# Patient Record
Sex: Female | Born: 1950 | Race: White | Hispanic: No | Marital: Married | Smoking: Never smoker
Health system: Southern US, Community
[De-identification: ages and names within clinical notes are randomized; demographics above are authoritative.]

## PROBLEM LIST (undated history)

## (undated) DIAGNOSIS — G2581 Restless legs syndrome: Secondary | ICD-10-CM

## (undated) DIAGNOSIS — R0602 Shortness of breath: Secondary | ICD-10-CM

## (undated) DIAGNOSIS — G4730 Sleep apnea, unspecified: Secondary | ICD-10-CM

## (undated) DIAGNOSIS — K469 Unspecified abdominal hernia without obstruction or gangrene: Secondary | ICD-10-CM

## (undated) DIAGNOSIS — E049 Nontoxic goiter, unspecified: Secondary | ICD-10-CM

## (undated) DIAGNOSIS — Z8719 Personal history of other diseases of the digestive system: Secondary | ICD-10-CM

## (undated) DIAGNOSIS — T4145XA Adverse effect of unspecified anesthetic, initial encounter: Secondary | ICD-10-CM

## (undated) DIAGNOSIS — C50919 Malignant neoplasm of unspecified site of unspecified female breast: Secondary | ICD-10-CM

## (undated) DIAGNOSIS — K7460 Unspecified cirrhosis of liver: Secondary | ICD-10-CM

## (undated) DIAGNOSIS — R011 Cardiac murmur, unspecified: Secondary | ICD-10-CM

## (undated) DIAGNOSIS — F419 Anxiety disorder, unspecified: Secondary | ICD-10-CM

## (undated) DIAGNOSIS — I454 Nonspecific intraventricular block: Secondary | ICD-10-CM

## (undated) DIAGNOSIS — K5790 Diverticulosis of intestine, part unspecified, without perforation or abscess without bleeding: Secondary | ICD-10-CM

## (undated) DIAGNOSIS — R51 Headache: Secondary | ICD-10-CM

## (undated) DIAGNOSIS — Z8711 Personal history of peptic ulcer disease: Secondary | ICD-10-CM

## (undated) DIAGNOSIS — T8859XA Other complications of anesthesia, initial encounter: Secondary | ICD-10-CM

## (undated) DIAGNOSIS — G5790 Unspecified mononeuropathy of unspecified lower limb: Secondary | ICD-10-CM

## (undated) DIAGNOSIS — K759 Inflammatory liver disease, unspecified: Secondary | ICD-10-CM

## (undated) DIAGNOSIS — D509 Iron deficiency anemia, unspecified: Secondary | ICD-10-CM

## (undated) DIAGNOSIS — T7840XA Allergy, unspecified, initial encounter: Secondary | ICD-10-CM

## (undated) DIAGNOSIS — K2970 Gastritis, unspecified, without bleeding: Secondary | ICD-10-CM

## (undated) DIAGNOSIS — G709 Myoneural disorder, unspecified: Secondary | ICD-10-CM

## (undated) DIAGNOSIS — K644 Residual hemorrhoidal skin tags: Secondary | ICD-10-CM

## (undated) DIAGNOSIS — F329 Major depressive disorder, single episode, unspecified: Secondary | ICD-10-CM

## (undated) DIAGNOSIS — E042 Nontoxic multinodular goiter: Secondary | ICD-10-CM

## (undated) DIAGNOSIS — F32A Depression, unspecified: Secondary | ICD-10-CM

## (undated) DIAGNOSIS — E0590 Thyrotoxicosis, unspecified without thyrotoxic crisis or storm: Secondary | ICD-10-CM

## (undated) DIAGNOSIS — I209 Angina pectoris, unspecified: Secondary | ICD-10-CM

## (undated) DIAGNOSIS — K219 Gastro-esophageal reflux disease without esophagitis: Secondary | ICD-10-CM

## (undated) DIAGNOSIS — I10 Essential (primary) hypertension: Secondary | ICD-10-CM

## (undated) DIAGNOSIS — G510 Bell's palsy: Secondary | ICD-10-CM

## (undated) DIAGNOSIS — Z923 Personal history of irradiation: Secondary | ICD-10-CM

## (undated) DIAGNOSIS — K635 Polyp of colon: Secondary | ICD-10-CM

## (undated) HISTORY — DX: Personal history of irradiation: Z92.3

## (undated) HISTORY — DX: Unspecified abdominal hernia without obstruction or gangrene: K46.9

## (undated) HISTORY — PX: BREAST SURGERY: SHX581

## (undated) HISTORY — DX: Unspecified cirrhosis of liver: K74.60

## (undated) HISTORY — DX: Cardiac murmur, unspecified: R01.1

## (undated) HISTORY — PX: ABDOMINAL HYSTERECTOMY: SHX81

## (undated) HISTORY — DX: Iron deficiency anemia, unspecified: D50.9

## (undated) HISTORY — PX: HAND SURGERY: SHX662

## (undated) HISTORY — PX: CARPAL TUNNEL RELEASE: SHX101

## (undated) HISTORY — DX: Personal history of other diseases of the digestive system: Z87.19

## (undated) HISTORY — DX: Bell's palsy: G51.0

## (undated) HISTORY — DX: Shortness of breath: R06.02

## (undated) HISTORY — DX: Gastritis, unspecified, without bleeding: K29.70

## (undated) HISTORY — DX: Diverticulosis of intestine, part unspecified, without perforation or abscess without bleeding: K57.90

## (undated) HISTORY — PX: BACK SURGERY: SHX140

## (undated) HISTORY — PX: SPINAL FUSION: SHX223

## (undated) HISTORY — DX: Allergy, unspecified, initial encounter: T78.40XA

## (undated) HISTORY — DX: Polyp of colon: K63.5

## (undated) HISTORY — DX: Residual hemorrhoidal skin tags: K64.4

## (undated) HISTORY — DX: Nontoxic multinodular goiter: E04.2

## (undated) HISTORY — PX: APPENDECTOMY: SHX54

## (undated) HISTORY — DX: Restless legs syndrome: G25.81

## (undated) HISTORY — DX: Sleep apnea, unspecified: G47.30

## (undated) HISTORY — PX: TUBAL LIGATION: SHX77

## (undated) HISTORY — DX: Nonspecific intraventricular block: I45.4

## (undated) HISTORY — PX: TONSILLECTOMY: SUR1361

## (undated) HISTORY — DX: Malignant neoplasm of unspecified site of unspecified female breast: C50.919

## (undated) HISTORY — PX: HERNIA REPAIR: SHX51

## (undated) HISTORY — DX: Unspecified mononeuropathy of unspecified lower limb: G57.90

## (undated) HISTORY — DX: Personal history of peptic ulcer disease: Z87.11

## (undated) HISTORY — PX: ANAL FISSURE REPAIR: SHX2312

---

## 1999-10-18 ENCOUNTER — Ambulatory Visit (HOSPITAL_COMMUNITY): Admission: RE | Admit: 1999-10-18 | Discharge: 1999-10-18 | Payer: Self-pay | Admitting: Family Medicine

## 1999-10-18 ENCOUNTER — Encounter: Payer: Self-pay | Admitting: Family Medicine

## 2000-06-07 ENCOUNTER — Other Ambulatory Visit: Admission: RE | Admit: 2000-06-07 | Discharge: 2000-06-07 | Payer: Self-pay | Admitting: Gynecology

## 2002-10-16 ENCOUNTER — Encounter: Payer: Self-pay | Admitting: Internal Medicine

## 2002-10-16 ENCOUNTER — Encounter: Admission: RE | Admit: 2002-10-16 | Discharge: 2002-10-16 | Payer: Self-pay | Admitting: Internal Medicine

## 2002-12-10 ENCOUNTER — Inpatient Hospital Stay (HOSPITAL_COMMUNITY): Admission: RE | Admit: 2002-12-10 | Discharge: 2002-12-16 | Payer: Self-pay | Admitting: Specialist

## 2002-12-10 ENCOUNTER — Encounter: Payer: Self-pay | Admitting: Specialist

## 2003-03-17 ENCOUNTER — Other Ambulatory Visit: Admission: RE | Admit: 2003-03-17 | Discharge: 2003-03-17 | Payer: Self-pay | Admitting: Gynecology

## 2005-06-14 ENCOUNTER — Ambulatory Visit: Payer: Self-pay | Admitting: Gastroenterology

## 2005-07-05 ENCOUNTER — Ambulatory Visit: Payer: Self-pay | Admitting: Gastroenterology

## 2009-10-30 DIAGNOSIS — C50919 Malignant neoplasm of unspecified site of unspecified female breast: Secondary | ICD-10-CM

## 2009-10-30 HISTORY — DX: Malignant neoplasm of unspecified site of unspecified female breast: C50.919

## 2010-03-15 ENCOUNTER — Ambulatory Visit: Payer: Self-pay | Admitting: Family Medicine

## 2010-03-24 ENCOUNTER — Encounter: Admission: RE | Admit: 2010-03-24 | Discharge: 2010-03-24 | Payer: Self-pay | Admitting: Family Medicine

## 2010-06-15 ENCOUNTER — Ambulatory Visit: Payer: Self-pay | Admitting: Family Medicine

## 2010-07-07 ENCOUNTER — Encounter (INDEPENDENT_AMBULATORY_CARE_PROVIDER_SITE_OTHER): Payer: Self-pay | Admitting: *Deleted

## 2010-07-08 ENCOUNTER — Encounter (INDEPENDENT_AMBULATORY_CARE_PROVIDER_SITE_OTHER): Payer: Self-pay | Admitting: *Deleted

## 2010-08-16 ENCOUNTER — Encounter (INDEPENDENT_AMBULATORY_CARE_PROVIDER_SITE_OTHER): Payer: Self-pay | Admitting: *Deleted

## 2010-08-18 ENCOUNTER — Encounter (INDEPENDENT_AMBULATORY_CARE_PROVIDER_SITE_OTHER): Payer: Self-pay | Admitting: *Deleted

## 2010-08-18 ENCOUNTER — Ambulatory Visit: Payer: Self-pay | Admitting: Gastroenterology

## 2010-08-31 ENCOUNTER — Ambulatory Visit: Payer: Self-pay | Admitting: Gastroenterology

## 2010-09-02 ENCOUNTER — Encounter: Payer: Self-pay | Admitting: Gastroenterology

## 2010-09-14 ENCOUNTER — Ambulatory Visit: Payer: Self-pay | Admitting: Family Medicine

## 2010-09-14 DIAGNOSIS — Z860100 Personal history of colon polyps, unspecified: Secondary | ICD-10-CM | POA: Insufficient documentation

## 2010-09-14 DIAGNOSIS — E782 Mixed hyperlipidemia: Secondary | ICD-10-CM | POA: Insufficient documentation

## 2010-09-14 DIAGNOSIS — E119 Type 2 diabetes mellitus without complications: Secondary | ICD-10-CM

## 2010-09-14 DIAGNOSIS — I10 Essential (primary) hypertension: Secondary | ICD-10-CM | POA: Insufficient documentation

## 2010-09-15 ENCOUNTER — Encounter: Payer: Self-pay | Admitting: Family Medicine

## 2010-10-07 ENCOUNTER — Telehealth: Payer: Self-pay | Admitting: Family Medicine

## 2010-11-02 ENCOUNTER — Telehealth (INDEPENDENT_AMBULATORY_CARE_PROVIDER_SITE_OTHER): Payer: Self-pay | Admitting: *Deleted

## 2010-11-10 ENCOUNTER — Telehealth: Payer: Self-pay | Admitting: Family Medicine

## 2010-11-27 LAB — CONVERTED CEMR LAB
ALT: 35 units/L (ref 0–35)
AST: 49 units/L — ABNORMAL HIGH (ref 0–37)
Albumin: 4.1 g/dL (ref 3.5–5.2)
Alkaline Phosphatase: 117 units/L (ref 39–117)
BUN: 9 mg/dL (ref 6–23)
Basophils Absolute: 0 10*3/uL (ref 0.0–0.1)
Basophils Relative: 0 % (ref 0–1)
CO2: 29 meq/L (ref 19–32)
Calcium: 9.7 mg/dL (ref 8.4–10.5)
Chloride: 100 meq/L (ref 96–112)
Cholesterol: 148 mg/dL
Creatinine, Ser: 0.65 mg/dL (ref 0.40–1.20)
Eosinophils Absolute: 0.2 10*3/uL (ref 0.0–0.7)
Eosinophils Relative: 2 % (ref 0–5)
Glucose, Bld: 153 mg/dL — ABNORMAL HIGH (ref 70–99)
HCT: 44 % (ref 36.0–46.0)
HDL: 60 mg/dL
Hemoglobin: 14.4 g/dL (ref 12.0–15.0)
Hgb A1c MFr Bld: 7.1 %
LDL Cholesterol: 74 mg/dL
Lymphocytes Relative: 29 % (ref 12–46)
Lymphs Abs: 2.2 10*3/uL (ref 0.7–4.0)
MCHC: 32.7 g/dL (ref 30.0–36.0)
MCV: 96.3 fL (ref 78.0–100.0)
Monocytes Absolute: 0.6 10*3/uL (ref 0.1–1.0)
Monocytes Relative: 8 % (ref 3–12)
Neutro Abs: 4.5 10*3/uL (ref 1.7–7.7)
Neutrophils Relative %: 60 % (ref 43–77)
Platelets: 182 10*3/uL (ref 150–400)
Potassium: 4.9 meq/L (ref 3.5–5.3)
RBC: 4.57 M/uL (ref 3.87–5.11)
RDW: 14.1 % (ref 11.5–15.5)
Sodium: 142 meq/L (ref 135–145)
TSH: 1.595 microintl units/mL (ref 0.350–4.500)
Total Bilirubin: 0.6 mg/dL (ref 0.3–1.2)
Total Protein: 7.3 g/dL (ref 6.0–8.3)
Triglyceride fasting, serum: 70 mg/dL
Vit D, 25-Hydroxy: 36 ng/mL (ref 30–89)
Vitamin B-12: 490 pg/mL (ref 211–911)
WBC: 7.5 10*3/uL (ref 4.0–10.5)

## 2010-11-29 NOTE — Progress Notes (Signed)
  Phone Note Call from Patient   Caller: Patient Summary of Call: ***REVISED***  The patient called because her Blood  sugar is over 200 compared to 7.1 for the last 3 months every day.  She stated based on her last conversation with Dr. Laural Benes.  If her blood sugar go up again the doctor will prescribe something else for her.  You can contact the patient at [phone].  *****The patient also said she has dry mouth.**** Initial call taken by: Dorna Leitz,  October 07, 2010 12:04 PM  Follow-up for Phone Call        Please have the patient to begin taking a new medication called glimepiride 2 mg tabs.  Instructions are to take 1 tablet in the morning with breakfast once per day.  If she doesn't eat breakfast, don't take the pill.   I think that her dry mouth can be coming from the zyrtec medication that she is taking and also from elevated blood sugars.  Please keep checking blood sugars and call us in 2 weeks to report the blood sugars so we can decide if the new medication is working.  Please call if you have any blood sugars less than 80.    Follow-up by: Standley Dakins MD,  October 07, 2010 12:16 PM  Additional Follow-up for Phone Call Additional follow up Details #1::        Pt was notified of new medications to begin taking. Pt also advised to keep a record of her blood sugar readings and contact us with that record in 2 weeks, and also if she has continuing blood sugars below 80. She was advised the the zyrtec that she has been taking and also elevated blood sugars may be causing her dry mouth. Additional Follow-up by: Levonne Spiller EMT-P,  October 07, 2010 12:28 PM    New/Updated Medications: GLIMEPIRIDE 2 MG TABS (GLIMEPIRIDE) take 1 tablet in the morning with breakfast, don't take if you don't eat breakfast, but you can take with first meal of day Prescriptions: GLIMEPIRIDE 2 MG TABS (GLIMEPIRIDE) take 1 tablet in the morning with breakfast, don't take if you don't eat breakfast,  but you can take with first meal of day  #30 x 0   Entered and Authorized by:   Standley Dakins MD   Signed by:   Standley Dakins MD on 10/07/2010   Method used:   [facility]  [address] [phone][facility] (retail[facility]       [address]       [place], [place]  [address]       Ph: [phone] or [phone]       Fax: [phone]   RxID:   [id number]

## 2010-11-29 NOTE — Letter (Signed)
Summary: Pre Visit Letter Revised  [facility]  [address] [place], [place] [address]   Phone: [phone]  Fax: [phone]        07/08/2010 MRN: [id number] [facility] [place] [address] [place], [place]  [address]                            Procedure Date:  08-31-10  Welcome to the Gastroenterology Division at [facility].    You are scheduled to see a nurse for your pre-procedure visit on 08-17-10 at 10:30A.M. on the 3rd floor at [facility], [address]  We ask that you try to arrive at our office 15 minutes prior to your appointment time to allow for check-in.  Please take a minute to review the attached form.  If you answer "Yes" to one or more of the questions on the first page, we ask that you call the person listed at your earliest opportunity.  If you answer "No" to all of the questions, please complete the rest of the form and bring it to your appointment.    Your nurse visit will consist of discussing your medical and surgical history, your immediate family medical history, and your medications.   If you are unable to list all of your medications on the form, please bring the medication bottles to your appointment and we will list them.  We will need to be aware of both prescribed and over the counter drugs.  We will need to know exact dosage information as well.    Please be prepared to read and sign documents such as consent forms, a financial agreement, and acknowledgement forms.  If necessary, and with your consent, a friend or relative is welcome to sit-in on the nurse visit with you.  Please bring your insurance card so that we may make a copy of it.  If your insurance requires a referral to see a specialist, please bring your referral form from your primary care physician.  No co-pay is required for this nurse visit.     If you cannot keep your appointment, please call [phone] to cancel or reschedule prior to your  appointment date.  This allows [place] the opportunity to schedule an appointment for another patient in need of care.    Thank you for choosing [facility] for your medical needs.  We appreciate the opportunity to care for you.  Please visit [place] at our website  to learn more about our practice.  Sincerely, The Gastroenterology Division

## 2010-11-29 NOTE — Letter (Signed)
Summary: [facility] Instructions  [facility]  [address] [place], [place] [address]   Phone: [phone]  Fax: [phone]       Daisy Larson    Aug 14, 1951    MRN: [id number]        Procedure Day Dorna Bloom:  [facility] 08/31/10     Arrival Time: 7:30am     Procedure Time: 8:30am     Location of Procedure:                    [name]  [facility] (4th Floor)                        PREPARATION FOR COLONOSCOPY WITH MOVIPREP   Starting 5 days prior to your procedure  FRIDAY 10/28 do not eat nuts, seeds, popcorn, corn, beans, peas,  salads, or any raw vegetables.  Do not take any fiber supplements (e.g. Metamucil, Citrucel, and Benefiber).  THE DAY BEFORE YOUR PROCEDURE         DATE:  TUESDAY 11/01  1.  Drink clear liquids the entire day-NO SOLID FOOD  2.  Do not drink anything colored red or purple.  Avoid juices with pulp.  No orange juice.  3.  Drink at least 64 oz. (8 glasses) of fluid/clear liquids during the day to prevent dehydration and help the prep work efficiently.  CLEAR LIQUIDS INCLUDE: Water Jello Ice Popsicles Tea (sugar ok, no milk/cream) Powdered fruit flavored drinks Coffee (sugar ok, no milk/cream) Gatorade Juice: apple, white grape, white cranberry  Lemonade Clear bullion, consomm, broth Carbonated beverages (any kind) Strained chicken noodle soup Hard Candy                             4.  In the morning, mix first dose of MoviPrep solution:    Empty 1 Pouch A and 1 Pouch B into the disposable container    Add lukewarm drinking water to the top line of the container. Mix to dissolve    Refrigerate (mixed solution should be used within 24 hrs)  5.  Begin drinking the prep at 5:00 p.m. The MoviPrep container is divided by 4 marks.   Every 15 minutes drink the solution down to the next mark (approximately 8 oz) until the full liter is complete.   6.  Follow completed prep with 16 oz of clear liquid of your choice  (Nothing red or purple).  Continue to drink clear liquids until bedtime.  7.  Before going to bed, mix second dose of MoviPrep solution:    Empty 1 Pouch A and 1 Pouch B into the disposable container    Add lukewarm drinking water to the top line of the container. Mix to dissolve    Refrigerate  THE DAY OF YOUR PROCEDURE      DATE:  [facility] 11/02  Beginning at  3:30 a.m. (5 hours before procedure):         1. Every 15 minutes, drink the solution down to the next mark (approx 8 oz) until the full liter is complete.  2. Follow completed prep with 16 oz. of clear liquid of your choice.    3. You may drink clear liquids until 6:30am (2 HOURS BEFORE PROCEDURE).   MEDICATION INSTRUCTIONS  Unless otherwise instructed, you should take regular prescription medications with a small sip of water   as early as possible the morning of  your procedure.  Diabetic patients - see separate instructions.           OTHER INSTRUCTIONS  You will need a responsible adult at least 61 years of age to accompany you and drive you home.   This person must remain in the waiting room during your procedure.  Wear loose fitting clothing that is easily removed.  Leave jewelry and other valuables at home.  However, you may wish to bring a book to read or  an iPod/MP3 player to listen to music as you wait for your procedure to start.  Remove all body piercing jewelry and leave at home.  Total time from sign-in until discharge is approximately 2-3 hours.  You should go home directly after your procedure and rest.  You can resume normal activities the  day after your procedure.  The day of your procedure you should not:   Drive   Make legal decisions   Operate machinery   Drink alcohol   Return to work  You will receive specific instructions about eating, activities and medications before you leave.    The above instructions have been reviewed and explained to me by   Karl Bales  RN  August 18, 2010 11:00 AM    I fully understand and can verbalize these instructions _____________________________ Date _________

## 2010-11-29 NOTE — Letter (Signed)
Summary: Patient Notice- Polyp Results  [facility]  [address] [place], [place] [address]   Phone: [phone]  Fax: [phone]        September 02, 2010 MRN: [id number]    [facility] [address] [place], [place]  [address]    Dear Ms. Allard,  I am pleased to inform you that the colon polyp(s) removed during your recent colonoscopy was (were) found to be benign (no cancer detected) upon pathologic examination.  I recommend you have a repeat colonoscopy examination in 5_ years to look for recurrent polyps, as having colon polyps increases your risk for having recurrent polyps or even colon cancer in the future.  Should you develop new or worsening symptoms of abdominal pain, bowel habit changes or bleeding from the rectum or bowels, please schedule an evaluation with either your primary care physician or with me.  Additional information/recommendations:  _X_ No further action with gastroenterology is needed at this time. Please      follow-up with your primary care physician for your other healthcare      needs.  __ Please call [phone] to schedule a return visit to review your      situation.  __ Please keep your follow-up visit as already scheduled.  __ Continue treatment plan as outlined the day of your exam.  Please call us if you are having persistent problems or have questions about your condition that have not been fully answered at this time.  Sincerely,  Mardella Layman MD [facility]  This letter has been electronically signed by your physician.  Appended Document: Patient Notice- Polyp Results letter mailed

## 2010-11-29 NOTE — Letter (Signed)
Summary: Diabetic Instructions  [facility]  [address]   [place], [place] [address]   Phone: [phone]  Fax: [phone]    Daisy Larson 04/24/1951 MRN: [id number]   x   ORAL DIABETIC MEDICATION INSTRUCTIONS  The day before your procedure:   Take your diabetic pill as you do normally  The day of your procedure:   Do not take your diabetic pill    We will check your blood sugar levels during the admission process and again in Recovery before discharging you home  ________________________________________________________________________

## 2010-11-29 NOTE — Procedures (Signed)
Summary: Colonoscopy  Patient: Daisy Larson Note: All result statuses are Final unless otherwise noted.  Tests: (1) Colonoscopy (COL)   COL Colonoscopy           DONE     [facility]     [address]     [place], [place]  [address]           COLONOSCOPY PROCEDURE REPORT           PATIENT:  Daisy Larson, Daisy Larson  MR#:  [id number]     BIRTHDATE:  04-06-1951, 59 yrs. old  GENDER:  female     ENDOSCOPIST:  Vania Rea. Jarold Motto, MD, [facility]     REF. BY:  Holley Bouche, M.D.     PROCEDURE DATE:  08/31/2010     PROCEDURE:  Colonoscopy with snare polypectomy     ASA CLASS:  Class II     INDICATIONS:  family history of colon cancer     MEDICATIONS:   Fentanyl 100 mcg IV, Versed 10 mg IV           DESCRIPTION OF PROCEDURE:   After the risks benefits and     alternatives of the procedure were thoroughly explained, informed     consent was obtained.  Digital rectal exam was performed and     revealed perianal skin tags.   The LB CF-[id number] endoscope     was introduced through the anus and advanced to the cecum, which     was identified by both the appendix and ileocecal valve, limited     by a redundant colon, extreme patient discomfort, a tortuous and     redundant colon.    The quality of the prep was good, using     MoviPrep.  The instrument was then slowly withdrawn as the colon     was fully examined.     <<PROCEDUREIMAGES>>           FINDINGS:  Severe diverticulosis was found in the sigmoid to     descending colon segments.  There were multiple polyps identified     and removed. 3-6 MM POLYPS COLD AND HOT SNARE EXCISED THROUGHOUT     THE COLON.   Retroflexed views in the rectum revealed no     abnormalities.    The scope was then withdrawn from the patient     and the procedure completed.           COMPLICATIONS:  None     ENDOSCOPIC IMPRESSION:     1) Severe diverticulosis in the sigmoid to descending colon     segments     2) Polyps, multiple     R/O  ADENOMAS.     RECOMMENDATIONS:     1) high fiber diet     2) Repeat Colonoscopy in 5 years.     REPEAT EXAM:  No           ______________________________     Vania Rea. Jarold Motto, MD, [facility]           CC:           n.     eSIGNED:   Vania Rea. Patterson at 08/31/2010 09:04 AM           Enrigue Catena, [id number]  Note: An exclamation mark (!) indicates a result that was not dispersed into the flowsheet. Document Creation Date: 08/31/2010 9:05 AM _______________________________________________________________________  (1) Order result status: Final Collection or observation date-time: 08/31/2010 08:59 Requested date-time:  Receipt date-time:  Reported date-time:  Referring Physician:   Ordering Physician: Sheryn Bison [id number]) Specimen Source:  Source: Launa Grill Order Number: [id number] Lab site:   Appended Document: Colonoscopy 5Y F/U  Appended Document: Colonoscopy     Procedures Next Due Date:    Colonoscopy: 08/2015

## 2010-11-29 NOTE — Miscellaneous (Signed)
Summary: [facility] previsit  Clinical Lists Changes  Medications: Added new medication of MOVIPREP 100 GM  SOLR (PEG-KCL-NACL-NASULF-NA ASC-C) As per prep instructions. - Signed Rx of MOVIPREP 100 GM  SOLR (PEG-KCL-NACL-NASULF-NA ASC-C) As per prep instructions.;  #1 x 0;  Signed;  Entered by: Karl Bales RN;  Authorized by: Mardella Layman MD [facility];  Method used: [facility]  [address] [phone]*, [address] [place], [place], [place]  [address], Ph: [phone] or [phone], Fax: [phone] Allergies: Added new allergy or adverse reaction of ADHESIVE TAPE Observations: Added new observation of NKA: F (08/18/2010 10:31)    Prescriptions: MOVIPREP 100 GM  SOLR (PEG-KCL-NACL-NASULF-NA ASC-C) As per prep instructions.  #1 x 0   Entered by:   Karl Bales RN   Authorized by:   Mardella Layman MD [facility]   Signed by:   Karl Bales RN on 08/18/2010   Method used:   [facility]        [facility]  [address] [phone]* (retail)       [address]       [place], [place]  [address]       Ph: [phone] or [phone]       Fax: [phone]   RxID:   [id number]

## 2010-11-29 NOTE — Assessment & Plan Note (Signed)
Summary: follow up visit/jbb   Vital Signs:  Patient profile:   60 year old female Height:      68 inches Weight:      265 pounds BMI:     40.44 O2 Sat:      98 % on Room air Temp:     98.7 degrees F tympanic Pulse rate:   76 / minute Pulse rhythm:   regular Resp:     20 per minute BP sitting:   130 / 80  (right arm) Cuff size:   large  Vitals Entered By: Levonne Spiller EMT-P (September 14, 2010 11:30 AM)  O2 Flow:  Room air CC: Follow-Up visit  Is Patient Diabetic? Yes Did you bring your meter with you today? No Pain Assessment Patient in pain? no      Nutritional Status BMI of > 30 = obese  Does patient need assistance? Functional Status Self care Ambulation Normal  Lab Results    Ordered by:  Rodney Langton, MD    Date tests performed: 09/14/2010    Performed by:  Toney  Lipids      Chol:     148   mg/dL    Trig:     70   mg/dL    HDL:     60   mg/dL    LDL:     74   mg/dL     Hgb D6U:     7.1   %   Chief Complaint:  Follow-Up visit .  History of Present Illness: This patient is presenting today for a followup visit.  I was following the patient for diabetes at [facility].  The patient reports that she has been working hard to improve her glycemic control.  She has been eating better.  She has not been exercising much.  She has been sneaking some candy and snacks late at night but overall her habits have been improving.  She says that she is going to see a cardiologist next month for a possible bundle branch block that her PCP found.  She says that she has been concerned about Actos because of reports in the news about possible bladder cancer.  She says that she is afraid to keep taking it and would like to stop it.  She says that she is having no low blood sugar readings.  Her morning fasting readings are between 150-170 and sometimes they are in the low 120s if she is not snacking late in the night.  She says that she feels better.   She is having  more energy.  She is helping her husband to recover from her recent heart transplant.  She otherwise reports that she has been well.    Preventive Screening-Counseling & Management  Alcohol-Tobacco     Smoking Status: never      Drug Use:  no.    Allergies (verified): 1)  ! Adhesive Tape  Past History:  Family History: Last updated: 09/14/2010 Family History of CAD Female 1st degree relative <60 Family History of CAD Female 1st degree relative <50 Family History Diabetes 1st degree relative  Social History: Last updated: 09/14/2010 Married Never Smoked Alcohol use-no Drug use-no Her Daughter works at [employer]  Risk Factors: Smoking Status: never (09/14/2010)  Past Medical History: Diabetes Mellitus, type 2 Hypertension Dyslipidemia Anxiety  Past Surgical History: Appendectomy Caesarean section Tubal ligation Tonsillectomy Fusion L4-L5 Carpal Tunnel Surgery  Family History: Family History of CAD Female 1st degree relative <60  Family History of CAD Female 1st degree relative <50 Family History Diabetes 1st degree relative  Social History: Married Never Smoked Alcohol use-no Drug use-no Her Daughter works at [employer] Smoking Status:  never Drug Use:  no Occupation:  Retired  Review of Systems       The patient complains of vision loss, decreased hearing, nasal congestion, nausea, joint pain, muscle weakness, and abnormal bruising.  The patient denies fever, chills, sweats, anorexia, fatigue, weakness, malaise, weight loss, sleep disorder, blurring, diplopia, eye irritation, eye discharge, eye pain, photophobia, earache, ear discharge, tinnitus, nosebleeds, sore throat, hoarseness, chest pain, palpitations, syncope, dyspnea on exertion, orthopnea, PND, peripheral edema, cough, dyspnea at rest, excessive sputum, hemoptysis, wheezing, pleurisy, vomiting, diarrhea, constipation, change in bowel habits, abdominal pain, melena, hematochezia, jaundice,  gas/bloating, indigestion/heartburn, dysphagia, odynophagia, dysuria, hematuria, urinary frequency, urinary hesitancy, nocturia, incontinence, genital sores, decreased libido, erectile dysfunction, joint swelling, stiffness, arthritis, sciatica, restless legs, leg pain at night, leg pain with exertion, rash, itching, dryness, suspicious lesions, paralysis, paresthesias, seizures, tremors, vertigo, transient blindness, frequent falls, frequent headaches, difficulty walking, depression, anxiety, memory loss, suicidal ideation, hallucinations, paranoia, phobia, confusion, cold intolerance, heat intolerance, polydipsia, polyphagia, polyuria, unusual weight change, bleeding, enlarged lymph nodes, urticaria, allergic rash, hay fever, and recurrent infections.   Eyes:  Wears glasses.. ENT:  Complains of decreased hearing, nasal congestion, and sinus pressure. GI:  Complains of nausea and vomiting. MS:  Complains of joint pain and muscle aches. Derm:  Pt complains of brusing.. Neuro:  Complains of numbness. Heme:  Complains of abnormal bruising.  Physical Exam  General:  Well-developed,well-nourished,in no acute distress; alert,appropriate and cooperative throughout examination Head:  Normocephalic and atraumatic without obvious abnormalities. No apparent alopecia or balding. Eyes:  No corneal or conjunctival inflammation noted. EOMI. Perrla. Funduscopic exam benign, without hemorrhages, exudates or papilledema. Vision grossly normal. Ears:  External ear exam shows no significant lesions or deformities.  Otoscopic examination reveals clear canals, tympanic membranes are intact bilaterally without bulging, retraction, inflammation or discharge. Hearing is grossly normal bilaterally. Nose:  nose piercing noted, no external erythema, no nasal discharge, no nasal polyps, mucosal erythema, and mucosal edema.   Mouth:  Oral mucosa and oropharynx without lesions or exudates.  Teeth in good repair. Neck:  supple,  full ROM, no masses, no thyroid nodules or tenderness, no JVD, normal carotid upstroke, no carotid bruits, and thyromegaly.   Chest Wall:  No deformities, masses, or tenderness noted. Lungs:  Normal respiratory effort, chest expands symmetrically. Lungs are clear to auscultation, no crackles or wheezes. Heart:  Normal rate and regular rhythm. S1 and S2 normal without gallop, murmur, click, rub or other extra sounds. Abdomen:  Bowel sounds positive,abdomen soft and non-tender without masses, organomegaly or hernias noted. Msk:  No deformity or scoliosis noted of thoracic or lumbar spine.   Pulses:  R and L carotid,radial,femoral,dorsalis pedis and posterior tibial pulses are full and equal bilaterally Extremities:  No clubbing, cyanosis, edema, or deformity noted with normal full range of motion of all joints.   Neurologic:  No cranial nerve deficits noted. Station and gait are normal. Plantar reflexes are down-going bilaterally. DTRs are symmetrical throughout. Sensory, motor and coordinative functions appear intact. Skin:  Intact without suspicious lesions or rashes Cervical Nodes:  No lymphadenopathy noted Psych:  slightly anxious.    Diabetes Management Exam:    Foot Exam (with socks and/or shoes not present):       Sensory-Monofilament:  Left foot: normal       Inspection:          Left foot: normal    Eye Exam:       Eye Exam done elsewhere   Problems:  Medical Problems Added: 1)  Dx of Personal History of Colonic Polyps  (ICD-V12.72) 2)  Dx of Diabetes Mellitus, Type II, Uncontrolled  (ICD-250.02) 3)  Dx of Thyromegaly  (ICD-240.9) 4)  Dx of Hyperlipidemia, Mixed, With High Hdl  (ICD-272.2) 5)  Dx of Essential Hypertension, Benign  (ICD-401.1) 6)  Dx of Family History Diabetes 1st Degree Relative  (ICD-V18.0) 7)  Dx of Family History of Cad Female 1st Degree Relative <50  (ICD-V17.3) 8)  Dx of Family History of Cad Female 1st Degree Relative <60   (ICD-V16.49)  Impression & Recommendations:  Problem # 1:  DIABETES MELLITUS, TYPE II, UNCONTROLLED (ICD-250.02)  Her updated medication list for this problem includes:    Metformin Hcl 500 Mg Xr24h-tab (Metformin hcl) .Marland Kitchen... Take 2 tabs with breakfast and 2 tabs with supper    Victoza 18 Mg/62ml Soln (Liraglutide) .Marland Kitchen... Take 1 injection daily    Lisinopril 10 Mg Tabs (Lisinopril)    Aspirin Childrens 81 Mg Chew (Aspirin) PT DECIDED TO DISCONTINUE THE ACTOS MEDICATION BECAUSE OF FEARS OF BLADDER CANCER.  We discussed this in detail today and the patient is very concerned.  We discussed continuing the Victoza and to d/c the actos.  The patient reports that she is going to monitor her blood glucose closely and if her blood sugars start rising she will call us and we will call in another oral medication for her. She is going to be enrolled in the diabetes initiative program.  also, pt was counseled on Victoza and possible risks and benefits associated with the medications. We decided that the benefits outweighed potential risks and she will continue it.  Also, discussed diet and exercise and weight loss with the patient and her husband.    The risks, benefits and possible side effects were clearly explained and discussed with the patient.  The patient verbalized clear understanding.  The patient was given instructions to return if symptoms don't improve, worsen or new changes develop.  If it is not during clinic hours and the patient cannot get back to this clinic then the patient was told to seek medical care at an available urgent care or emergency department.  The patient verbalized understanding.    Problem # 2:  THYROMEGALY (ICD-240.9) Ordered a TSH today  Problem # 3:  ESSENTIAL HYPERTENSION, BENIGN (ICD-401.1)  Her updated medication list for this problem includes:    Lisinopril 10 Mg Tabs (Lisinopril)    Propranolol Hcl 60 Mg Tabs (Propranolol hcl) Continue to monitor the blood pressure  closely and we know now to take manual BP measurements and to use Extra large BP cuff.   Problem # 4:  HYPERLIPIDEMIA, MIXED, WITH HIGH HDL (ICD-272.2) Assessment: Improved Continue current Lipid Medications  Problem # 5:  Preventive Health Care (ICD-V70.0) The patient reported that she had a flu vaccine done earlier this year.  Pt had a colonoscopy done recently which did show benign colon polyps.  She will need a repeat test done in 3-5 years.   Complete Medication List: 1)  Metformin Hcl 500 Mg Xr24h-tab (Metformin hcl) .... Take 2 tabs with breakfast and 2 tabs with supper 2)  Victoza 18 Mg/78ml Soln (Liraglutide) .... Take 1 injection daily 3)  Gabapentin 800 Mg Tabs (Gabapentin) 4)  Cymbalta 60 Mg Cpep (Duloxetine hcl) 5)  Mirapex 0.25 Mg Tabs (Pramipexole dihydrochloride) 6)  Lisinopril 10 Mg Tabs (Lisinopril) 7)  Propranolol Hcl 60 Mg Tabs (Propranolol hcl) 8)  Zyrtec Allergy 10 Mg Caps (Cetirizine hcl) 9)  Flonase 50 Mcg/act Susp (Fluticasone propionate) 10)  Mucinex 600 Mg Xr12h-tab (Guaifenesin) 11)  Wellbutrin Xl 300 Mg Xr24h-tab (Bupropion hcl) 12)  Ativan 1 Mg Tabs (Lorazepam) 13)  Premarin 0.45 Mg Tabs (Estrogens conjugated) 14)  Aspirin Childrens 81 Mg Chew (Aspirin) 15)  Centrum Silver Tabs (Multiple vitamins-minerals) 16)  Calcium Citrate-vitamin D 200-125 Mg-unit Tabs (Calcium citrate-vitamin d) 17)  Vitamin D 1000 Unit Tabs (Cholecalciferol) 18)  Meclizine Hcl 25 Mg Tabs (Meclizine hcl) 19)  Robaxin 500 Mg Tabs (Methocarbamol)  I spent 65 mins with patient today reviewing her complex medical history and counseling her about dietary and lifestyle changes.  Also, we discussed the diabetes initiative.  We discussed that enrolling could be very beneficial for the patient and her husband.  The patient verbalized clear understanding.     Patient Instructions: 1)  Check your blood sugars regularly. If your readings are usually above 250 or below 70 you should contact  our office. 2)  It is important that your Diabetic A1c level is checked every 3 months. 3)  See your eye doctor yearly to check for diabetic eye damage. 4)  Check your feet each night for sore areas, calluses or signs of infection. 5)  It is important that you exercise regularly at least 20 minutes 5 times a week. If you develop chest pain, have severe difficulty breathing, or feel very tired , stop exercising immediately and seek medical attention. 6)  You need to lose weight. Consider a lower calorie diet and regular exercise.  7)  The patient was informed that there is no on-call provider or services available at this clinic during off-hours (when the clinic is closed).  If the patient developed a problem or concern that required immediate attention, the patient was advised to go the the nearest available urgent care or emergency department for medical care.  The patient verbalized understanding.    8)  It was clearly explained to the patient that this [facility] is not intended to be a primary care clinic.  The patient is always better served by the continuity of care and the provider/patient relationships developed with their dedicated primary care provider.  The patient was told to be sure to follow up as soon as possible with their primary care provider to discuss treatments received and to receive further examination and testing.  The patient verbalized understanding. The will f/u with PCP ASAP.  9)  Take an Aspirin every day.     Prescriptions: VICTOZA 18 MG/3ML SOLN (LIRAGLUTIDE) take 1 injection daily  #90 day supp x 3   Entered and Authorized by:   Standley Dakins MD   Signed by:   Standley Dakins MD on 09/14/2010   Method used:   Electronically to        [facility]  [address] [phone][facility] (retail)       [address]       [place], [place]  [address]       Ph: [phone] or [phone]       Fax: [phone]   RxID:   [id number]

## 2010-11-29 NOTE — Letter (Signed)
Summary: Colonoscopy Letter   Gastroenterology  [address] [place], [place] [address]   Phone: [phone]  Fax: [phone]      July 07, 2010 MRN: [id number]   [facility] [address] [place], [place]  [address]   Dear Ms. Tribby,   According to your medical record, it is time for you to schedule a Colonoscopy. The American Cancer Society recommends this procedure as a method to detect early colon cancer. Patients with a family history of colon cancer, or a personal history of colon polyps or inflammatory bowel disease are at increased risk.  This letter has beeen generated based on the recommendations made at the time of your procedure. If you feel that in your particular situation this may no longer apply, please contact our office.  Please call our office at [phone] to schedule this appointment or to update your records at your earliest convenience.  Thank you for cooperating with [place] to provide you with the very best care possible.   Sincerely,   Vania Rea. Jarold Motto, M.D.  [facility] [phone]

## 2010-12-01 NOTE — Progress Notes (Signed)
  Phone Note Call from Patient   Summary of Call: Mrs. Gilroy called and wants to know should she continue the Glimepiride 2 mg  1 tab in the am  that you prescribed at her last visit.  If so she needs you to call it in to her pharmacy for a refill.  Since the last visit she has been diagnosed with Bell's Palsy. Initial call taken by: Rosine Beat,  November 02, 2010 11:43 AM    Prescriptions: GLIMEPIRIDE 2 MG TABS (GLIMEPIRIDE) take 1 tablet in the morning with breakfast, don't take if you don't eat breakfast, but you can take with first meal of day  #30 x 0   Entered by:   Levonne Spiller EMT-P   Authorized by:   Standley Dakins MD   Signed by:   Levonne Spiller EMT-P on 11/02/2010   Method used:   [facility]  [address] [phone][facility] ([facility]ail[facility]       [address]       [place], [place]  [address]       Ph: [phone] or [phone]       Fax: [phone]   RxID:   [id number]

## 2010-12-01 NOTE — Progress Notes (Signed)
  Phone Note Call from Patient   Summary of Call: Pt was calling to give Dr. Laural Benes her Accu Check Readings.    As follows: (11/02/10--3:14pm--170, 7:46pm--264) (11/03/10--7:50am--147, 5:30pm--214) (11/04/10--6:52am--175, 6:05pm--210)  (11/05/10--1:08pm--157,  4:34pm--172) ( 11/06/10--7:02am--250, 2:02pm--275)  (11/07/10--1:46pm--211, 3:57pm--249) (11/08/10--2:20pm--210) (11/09/10--1:40pm--269,  8:21pm--299) (11/10/10--7:30am--219.    Jan. 6th was the first day without prednisone.   Initial call taken by: Levonne Spiller EMT-P,  November 10, 2010 1:19 PM  Follow-up for Phone Call        Please ask pt to try taking 1 glimepiride 2 mg tab with breakfast and 1 glimepiride 2 mg tab with supper.  OK to call in new RX for glimepiride 2mg  tabs.  Take 1 by mouth BIDAC #60, RFx2  Please tell patient to please call us again in about 2 weeks with new blood sugar readings.  Also, if patient has any blood sugar readings less than 100 to please call our office and report it asap.  Follow-up by: Standley Dakins MD,  November 11, 2010 7:23 PM  Additional Follow-up for Phone Call Additional follow up Details #1::        Patient notified and prescribtion called and sent to [facility] ; Patient  is very aware of instructions of keeping record of blood sugars  Additional Follow-up by: Providence Crosby LPN,  November 13, 2010 11:33 AM    New/Updated Medications: GLIMEPIRIDE 2 MG TABS (GLIMEPIRIDE) 1 tablet two times a day one with breakfast and 1 with supper GLIMEPIRIDE 2 MG TABS (GLIMEPIRIDE) 1 tablet two times a day ac breakfast and supper Prescriptions: GLIMEPIRIDE 2 MG TABS (GLIMEPIRIDE) 1 tablet two times a day one with breakfast and 1 with supper  #60 x 2   Entered by:   Providence Crosby LPN   Authorized by:   Standley Dakins MD   Signed by:   Providence Crosby LPN on 16/07/9603   Method used:   [facility]  [address] [phone][facility]       [address]       [place], [place]  [address]  Ph: [phone] or [phone]       Fax: [phone]   RxID:   [id number]

## 2010-12-02 NOTE — Letter (Signed)
Summary: medical records from [facility]  medical records from [facility]   Imported By: Rosine Beat 09/15/2010 12:23:23  _____________________________________________________________________  External Attachment:    Type:   Image     Comment:   External Document

## 2010-12-20 ENCOUNTER — Ambulatory Visit: Payer: Self-pay | Admitting: Family Medicine

## 2010-12-27 ENCOUNTER — Ambulatory Visit (INDEPENDENT_AMBULATORY_CARE_PROVIDER_SITE_OTHER): Payer: BC Managed Care – PPO | Admitting: Family Medicine

## 2010-12-27 ENCOUNTER — Encounter: Payer: Self-pay | Admitting: Family Medicine

## 2010-12-27 DIAGNOSIS — E1149 Type 2 diabetes mellitus with other diabetic neurological complication: Secondary | ICD-10-CM

## 2010-12-27 DIAGNOSIS — E1140 Type 2 diabetes mellitus with diabetic neuropathy, unspecified: Secondary | ICD-10-CM | POA: Insufficient documentation

## 2010-12-27 DIAGNOSIS — E1142 Type 2 diabetes mellitus with diabetic polyneuropathy: Secondary | ICD-10-CM | POA: Insufficient documentation

## 2010-12-27 DIAGNOSIS — E1165 Type 2 diabetes mellitus with hyperglycemia: Secondary | ICD-10-CM

## 2010-12-27 DIAGNOSIS — G2581 Restless legs syndrome: Secondary | ICD-10-CM | POA: Insufficient documentation

## 2010-12-27 DIAGNOSIS — I10 Essential (primary) hypertension: Secondary | ICD-10-CM

## 2011-01-05 NOTE — Assessment & Plan Note (Signed)
Summary: follow-up   Vital Signs:  Patient profile:   60 year old female Weight:      266 pounds O2 Sat:      99 % on Room air Temp:     98.6 degrees F oral Pulse rate:   78 / minute Pulse rhythm:   regular Resp:     14 per minute BP sitting:   143 / 87  (right arm)  Vitals Entered By: Standley Dakins MD (December 27, 2010 12:22 PM)  O2 Flow:  Room air  Chief Complaint:  follow up diabetes mellitus.  History of Present Illness: The patient is presenting today for evaluation and management of her type 2 DM.  She is taking Victoza 1.8 mg daily, glimepiride 2 mg by mouth two times a day, diet, exercise and has noticed some improvements.  She decided to stop taking Actos in November 2011 because of concerns about bladder cancer.  She says that she developed Bells' Palsy over the Christmas holidays.  She says that it has nearly completely resolved now.  She says that she is exercising more regularly and plans to lose 50 lbs after being informed by her cardiologist that this is her new weight loss goal.  She is checking her BS 2 times per day and is reporting AM fasting readings of 150 or less on most days and 170 or less more rarely related more to nighttime overnight snacking.  She says that she is not having any blood sugars less than 100.  She saw a cardiologist recently and had a stress test and echo that was reportedly seen as WNL.  She was told that she had developed a bundle branch block and has to report to see her cardiologist next month for follow up.   Her medications have not changed.  She is having a redness in two of the toes on her right foot and was concerned about cellulitis.  She does have diabetic neuropathy and has tingling and burning at all times not relieved by Lyrica and is only mildly improved with Cymbalta.     Allergies: 1)  ! Adhesive Tape  Past History:  Family History: Last updated: 09/14/2010 Family History of CAD Female 1st degree relative <60 Family  History of CAD Female 1st degree relative <50 Family History Diabetes 1st degree relative  Social History: Last updated: 09/14/2010 Married Never Smoked Alcohol use-no Drug use-no Her Daughter works at [employer]  Risk Factors: Smoking Status: never (09/14/2010)  Past Medical History: Diabetes Mellitus, type 2, with complications - Neuropathy Hypertension Dyslipidemia Anxiety Restless Leg Syndrome Bundle Branch Block  Past Surgical History: Reviewed history from 09/14/2010 and no changes required. Appendectomy Caesarean section Tubal ligation Tonsillectomy Fusion L4-L5 Carpal Tunnel Surgery  Family History: Reviewed history from 09/14/2010 and no changes required. Family History of CAD Female 1st degree relative <60 Family History of CAD Female 1st degree relative <50 Family History Diabetes 1st degree relative  Social History: Reviewed history from 09/14/2010 and no changes required. Married Never Smoked Alcohol use-no Drug use-no Her Daughter works at [employer]  Review of Systems General:  Complains of sweats; denies chills, fatigue, fever, loss of appetite, malaise, sleep disorder, weakness, and weight loss; occasional night sweats. Eyes:  Denies blurring, discharge, double vision, eye irritation, eye pain, halos, itching, light sensitivity, red eye, vision loss-1 eye, and vision loss-both eyes; glasses. ENT:  Complains of sinus pressure; denies decreased hearing, difficulty swallowing, ear discharge, earache, hoarseness, nasal congestion, nosebleeds, postnasal drainage, ringing  in ears, and sore throat. CV:  Denies bluish discoloration of lips or nails, chest pain or discomfort, difficulty breathing at night, difficulty breathing while lying down, fainting, fatigue, leg cramps with exertion, lightheadness, near fainting, palpitations, shortness of breath with exertion, swelling of feet, swelling of hands, and weight gain. Resp:  Denies chest discomfort, chest pain  with inspiration, cough, coughing up blood, excessive snoring, hypersomnolence, morning headaches, pleuritic, shortness of breath, sputum productive, and wheezing. GI:  Denies abdominal pain, bloody stools, change in bowel habits, constipation, dark tarry stools, diarrhea, excessive appetite, gas, hemorrhoids, indigestion, loss of appetite, nausea, vomiting, vomiting blood, and yellowish skin color. GU:  Denies abnormal vaginal bleeding, decreased libido, discharge, dysuria, genital sores, hematuria, incontinence, nocturia, urinary frequency, and urinary hesitancy. MS:  Complains of joint swelling and stiffness; denies joint pain, joint redness, loss of strength, low back pain, mid back pain, muscle aches, muscle , cramps, muscle weakness, and thoracic pain. Derm:  Denies changes in color of skin, changes in nail beds, dryness, excessive perspiration, flushing, hair loss, insect bite(s), itching, lesion(s), poor wound healing, and rash. Neuro:  Complains of brief paralysis; denies difficulty with concentration, disturbances in coordination, falling down, headaches, inability to speak, memory loss, numbness, poor balance, seizures, sensation of room spinning, tingling, tremors, visual disturbances, and weakness; Pt recovering from Bell's Palsy. Psych:  Denies alternate hallucination ( auditory/visual), anxiety, depression, easily angered, easily tearful, irritability, mental problems, panic attacks, sense of great danger, suicidal thoughts/plans, thoughts of violence, unusual visions or sounds, and thoughts /plans of harming others. Endo:  Denies cold intolerance, excessive hunger, excessive thirst, excessive urination, heat intolerance, polyuria, and weight change. Heme:  Complains of skin discoloration; redness of 3rd toe right foot.  Physical Exam  General:  Well-developed,well-nourished,in no acute distress; alert,appropriate and cooperative throughout examination Head:  Normocephalic and atraumatic  without obvious abnormalities. No apparent alopecia or balding. Eyes:  No corneal or conjunctival inflammation noted. EOMI. Perrla. Funduscopic exam benign, without hemorrhages, exudates or papilledema. Vision grossly normal. Ears:  External ear exam shows no significant lesions or deformities.  Otoscopic examination reveals clear canals, tympanic membranes are intact bilaterally without bulging, retraction, inflammation or discharge. Hearing is grossly normal bilaterally. Nose:  nose piercing noted, no external erythema, no nasal discharge, no nasal polyps, mucosal erythema, and mucosal edema.   Mouth:  Oral mucosa and oropharynx without lesions or exudates.  Teeth in good repair. Neck:  supple, full ROM, no masses, no thyroid nodules or tenderness, no JVD, normal carotid upstroke, no carotid bruits, and mild thyromegaly seems unchanged; no nodules palpated.   Lungs:  Normal respiratory effort, chest expands symmetrically. Lungs are clear to auscultation, no crackles or wheezes. Heart:  Normal rate and regular rhythm. S1 and S2 normal without gallop, murmur, click, rub or other extra sounds. Abdomen:  Bowel sounds positive,abdomen soft and non-tender without masses, organomegaly or hernias noted. Msk:  No deformity or scoliosis noted of thoracic or lumbar spine.   Pulses:  R and L carotid,radial,femoral,dorsalis pedis and posterior tibial pulses are full and equal bilaterally Extremities:  No clubbing, cyanosis, edema, or deformity noted with normal full range of motion of all joints.   Neurologic:  No cranial nerve deficits noted. Station and gait are normal.DTRs are symmetrical throughout. Sensory, motor and coordinative functions appear intact. Skin:  Intact without suspicious lesions or rashes Cervical Nodes:  No lymphadenopathy noted Inguinal Nodes:  No significant adenopathy Psych:  Cognition and judgment appear intact. Alert and cooperative with normal attention  span and concentration. No  apparent delusions, illusions, hallucinations Additional Exam:  diabetic foot exam; onychomycosis seen both feet; tinea pedis seen; redness right 3rd toe; warm to palpation; nontender; FROM; pedal pulses 2+ bilateral;    Impression & Recommendations:  Problem # 1:  DIAB W/NEURO MANIFESTS TYPE II/UNS NOT UNCNTRL (ICD-250.60) Overall improved but suboptimally controlled as evidenced by the a1c of 7.7%.   Her updated medication list for this problem includes:    Metformin Hcl 500 Mg Xr24h-tab (Metformin hcl) .Marland Kitchen... Take 2 tabs with breakfast and 2 tabs with supper    Lisinopril 10 Mg Tabs (Lisinopril)    Aspirin Childrens 81 Mg Chew (Aspirin)    Glimepiride 2 Mg Tabs (Glimepiride) .Marland Kitchen... 1 tablet two times a day ac breakfast and supper    Victoza 18 Mg/63ml Soln (Liraglutide) .Marland Kitchen... Take 1.8 mg sq once daily Today we discussed weight loss and regular exercise. The patient is going to start a low carb diet with the goal of losing 50 lbs overall. She will try to lose 10 lbs by next visit in 3 months.  Continue monitoring BG carefully.  two times a day.  Cautioned patient about hypoglycemia.  Continue to avoid heavy daytime meals.  Continue to avoid high carb/sugar foods.  Foot care precautions discussed with patient. The patient verbalized clear understanding.     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    Orders: Hemoglobin A1C (83036)  Problem # 3:  ESSENTIAL HYPERTENSION, BENIGN (ICD-401.1)  Her updated medication list for this problem includes:    Lisinopril 10 Mg Tabs (Lisinopril)    Propranolol Hcl 60 Mg Tabs (Propranolol hcl)  Asked pt to continue to monitor her BP closely and in conjunction with her PCP who is monitoring this.  She is scheduled to  see her cardiologist next month for follow up as well.   No current changes made to medications.    Problem # 4:  ? of CELLULITIS, TOE (ICD-681.10)  Her updated medication list for this problem includes:    Cephalexin 500 Mg Caps (Cephalexin) .Marland Kitchen... Take 1 by mouth every 6 hours until completed  Pt will try this and will follow up with her podiatrist to recheck in 5-7 days.    Complete Medication List: 1)  Metformin Hcl 500 Mg Xr24h-tab (Metformin hcl) .... Take 2 tabs with breakfast and 2 tabs with supper 2)  Gabapentin 800 Mg Tabs (Gabapentin) 3)  Cymbalta 60 Mg Cpep (Duloxetine hcl) 4)  Mirapex 0.25 Mg Tabs (Pramipexole dihydrochloride) 5)  Lisinopril 10 Mg Tabs (Lisinopril) 6)  Propranolol Hcl 60 Mg Tabs (Propranolol hcl) 7)  Zyrtec Allergy 10 Mg Caps (Cetirizine hcl) 8)  Flonase 50 Mcg/act Susp (Fluticasone propionate) 9)  Mucinex 600 Mg Xr12h-tab (Guaifenesin) 10)  Wellbutrin Xl 300 Mg Xr24h-tab (Bupropion hcl) 11)  Ativan 1 Mg Tabs (Lorazepam) 12)  Premarin 0.45 Mg Tabs (Estrogens conjugated) 13)  Aspirin Childrens 81 Mg Chew (Aspirin) 14)  Centrum Silver Tabs (Multiple vitamins-minerals) 15)  Calcium Citrate-vitamin D 200-125 Mg-unit Tabs (Calcium citrate-vitamin d) 16)  Vitamin D 1000 Unit Tabs (Cholecalciferol) 17)  Meclizine Hcl 25 Mg Tabs (Meclizine hcl) 18)  Robaxin 500 Mg Tabs (Methocarbamol) 19)  Glimepiride 2 Mg Tabs (Glimepiride) .Marland Kitchen.. 1 tablet two times a day ac breakfast and supper 20)  Cephalexin 500 Mg Caps (Cephalexin) .... Take 1 by mouth every 6 hours until completed 21)  Victoza 18 Mg/48ml Soln (Liraglutide) .... Take 1.8 mg sq once daily  Patient Instructions: 1)  Go to the pharmacy and pick up your prescription (s).  It may take up to 30 mins for electronic prescriptions to be delivered to the pharmacy.  Please call if your pharmacy has not received your prescriptions after 30 minutes.   2)  Check your blood sugars regularly. If your readings are usually  above 200  or below 70 you should contact our office. 3)  It is important that your Diabetic A1c level is checked every 3 months.  Today it is 7.7%.  4)  See your eye doctor yearly to check for diabetic eye damage. 5)  Check your feet each night for sore areas, calluses or signs of infection. 6)  Check your Blood Pressure regularly. If it is above: 140/90 you should make an appointment. 7)  The patient was informed that there is no on-call provider or services available at this clinic during off-hours (when the clinic is closed).  If the patient developed a problem or concern that required immediate attention, the patient was advised to go the the nearest available urgent care or emergency department for medical care.  The patient verbalized understanding.    8)  It was clearly explained to the patient that this [facility] is not intended to be a primary care clinic.  The patient is always better served by the continuity of care and the provider/patient relationships developed with their dedicated primary care provider.  The patient was told to be sure to follow up as soon as possible with their primary care provider to discuss treatments received and to receive further examination and testing.  The patient verbalized understanding. The will f/u with PCP ASAP.  9)  It is important that you exercise regularly at least 20 minutes 5 times a week. If you develop chest pain, have severe difficulty breathing, or feel very tired , stop exercising immediately and seek medical attention. 10)  You need to lose weight. Consider a lower calorie, low carb diet and regular exercise.  Prescriptions: CEPHALEXIN 500 MG CAPS (CEPHALEXIN) take 1 by mouth every 6 hours until completed  #28 x 0   Entered and Authorized by:   Standley Dakins MD   Signed by:   Standley Dakins MD on 12/27/2010   Method used:   Electronically to        [facility]  [address] [phone]* (retail)       [address]       [place], [place]  [address]       Ph: [phone] or [phone]       Fax: [phone]   RxID:   [id number]    Orders Added: 1)  Hemoglobin A1C [83036]       Hgb A1C:     7.7   %

## 2011-01-14 ENCOUNTER — Encounter: Payer: Self-pay | Admitting: Family Medicine

## 2011-01-17 NOTE — Letter (Signed)
Summary: Medication list  Medication list   Imported By: Rosine Beat 01/14/2011 15:49:38  _____________________________________________________________________  External Attachment:    Type:   Image     Comment:   External Document

## 2011-03-17 NOTE — Consult Note (Signed)
NAME:  Daisy Larson, Daisy Larson                      ACCOUNT NO.:  [id number]   MEDICAL RECORD NO.:  [id number]                   PATIENT TYPE:  INP   LOCATION:  5016                                 FACILITY:  [facility]   PHYSICIAN:  Rozanna Boer., M.D.      DATE OF BIRTH:  May 23, 1951   DATE OF CONSULTATION:  12/12/2002  DATE OF DISCHARGE:                                   CONSULTATION   REQUESTING PHYSICIAN:  Dr. Jene Every.   REASON FOR CONSULTATION:  Chronic UTIs, urinary sediment causing obstruction  of her Foley catheter.   HISTORY OF PRESENT ILLNESS:  This 60 year old white female had refractory  lower extremity radicular pain secondary to degenerative spondylolisthesis,  L4 and L5 nerve root compression.  She had a lumbar decompression at L4-5 on  December 10, 2002 by Dr. Shelle Iron with some fusion.  She has had Foley catheter  since surgery but apparently was draining poorly and had to be irrigated  this morning and reportedly, 1200 mL of urine were returned after irrigation  with relief of her suprapubic pain.  She has had a history of urinary tract  infections for years.  She self-medicates with cranberry juice for periodic  symptoms of frequency, urgency and burning but she has not had a documented  UTI for several years.  About 20 years ago, she took prolonged courses  (three months) of Macrodantin for chronic UTIs but has not done this  recently.  She has been on IV Ancef since the surgery, but her IV has been  out for six hours and has been difficult to restart apparently.  She is  minimally ambulatory now.  She has gotten up to the chair but has not really  walked very much.  Since this admission, she has also developed type 1  diabetes and is on both insulin and oral hypoglycemics, but it is thought  that this might be just a temporary setback.  She has a very strong family  history of diabetes.  Her other medical problems include migraines, on  Inderal  prophylactically.  She is a nonsmoker, does not drink.  She is obese  and has a sedentary lifestyle and hypotension.   PHYSICAL EXAMINATION:  GENERAL:  This pleasant middle-aged white female  appears to be in no acute distress.  She is with her younger daughter who is  feeding her presently.  LUNGS:  Lungs are clear.  ABDOMEN:  Abdomen is soft and benign, but quite obese.  Liver and spleen  nonpalpable.  GU:  Foley catheter in place.  Urine is clear.  PELVIC:  Pelvic was unable to be performed in bed.  Her perineal sensation  seems to be intact.  EXTREMITIES:  No edema of the lower extremities.   IMPRESSION:  1. Hypotonic bladder.  2. Recent-onset type 2 diabetes.  3. History of recurrent urinary tract infections.  4. Recent lumbar decompression.    RECOMMENDATION:  Will start Flomax 0.4 mg a  day for now and leave catheter  until Monday morning.  Would try to ambulate her as much as can be tolerated  and will increase her p.o. and IV fluids as tolerated as well.  I will check  her back next week to see if she is voiding and remove the catheter if she  is ambulating better at that time.   Thank you.                                               Rozanna Boer., M.D.    HMK/MEDQ  D:  12/12/2002  T:  12/13/2002  Job:  [id number]

## 2011-03-17 NOTE — Discharge Summary (Signed)
NAME:  Daisy Larson, Daisy Larson                      ACCOUNT NO.:  [id number]   MEDICAL RECORD NO.:  [id number]                   PATIENT TYPE:  INP   LOCATION:  5016                                 FACILITY:  [facility]   PHYSICIAN:  Jene Every, M.D.                 DATE OF BIRTH:  [birth year]   DATE OF ADMISSION:  12/10/2002  DATE OF DISCHARGE:  12/16/2002                                 DISCHARGE SUMMARY   ADMISSION DIAGNOSES:  1. Spondylolisthesis at L4-5.  2. Migraines.  3. Borderline hypertension.   DISCHARGE DIAGNOSES:  1. Spondylolisthesis at L4-5, status post decompression of L4-5 with     posterolateral fusion using pedicle screw instrumentation.  2. Migraines.  3. Borderline hypertension.  4. Diabetes mellitus.  5. Chronic urinary tract infection.   PROCEDURES:  The patient was taken to the operating room on December 10, 2002, to undergo a lumbar decompression at L4-5 with posterolateral fusion  using pedicle screw instrumentation and allometrics bone graft with  intraoperative free bony margin by Jene Every, M.D., and assistants Sharolyn Douglas, M.D., and Roma Schanz, P.A., under general anesthesia.  During  surgery, one Hemovac drain was placed.   CONSULTS:  1. PT and OT.  2. Hospitalist, Deirdre Peer. Polite, M.D.  3. Urology.   HISTORY OF PRESENT ILLNESS:  The patient is a 60 year old female with a  longstanding history of left lower extremity pain with numbness and  tingling.  She presented to our office in October of 2003 with worsening of  this pain.  On physical exam, she had positive straight leg raise  bilaterally with EHL 5/5.  She had decreased sensation along the L5  dermatome.  MRI of the lumbar spine showed an annular tear and tiny disk  protrusion at L4-5 with severe facet degenerative disease.  The patient  underwent epidural sternoid injection without significant relief.  A CT  myelogram showed instability of L4-5 with cut off at the L4 nerve root  on  the left secondary to disk protrusion and spondylolisthesis.  Due to the  patient's disabling pain and results of CT studies, it was felt she would  benefit from a decompression of L4-5 with posterolateral effusion with  pedicle screw instrumentation.   LABORATORY DATA:  Preoperative labs:  The CBC showed a white blood cell  count of 7.5, hemoglobin 14.3, and hematocrit 41.5.  Serial hemoglobins,  hematocrits, and CBCs were followed throughout the patient's hospital  course.  The patient did have a slight elevation in her white blood cell  count to 11.8, however, at the time of discharge this resolved to a level of  9.1.  H&Hs were followed throughout the hospital course.  The patient then  dropped to a hemoglobin level of 10.3 and a hematocrit of 28.8.  At the time  of discharge, this resolved to a hemoglobin of 10.6 and a hematocrit of  30.0.  Routine chemistries  done preoperatively showed a sodium of 138,  potassium 4.6, glucose 227, BUN 6, and creatinine 0.8.  Serial routine  chemistries were followed throughout the hospital course.  The patient  continued to have elevated glucose at 209.  All other values remained within  normal range.  Routine liver functions were obtained with an AST of 29, an  ALT of 32, and an ALP of 61.  The hemoglobin A1C done at the time of  admission was normal at 5.8.  Also, a lipid panel was done which showed a  cholesterol of 133, HDL 38, and LDL 70.  Urinalysis done at the time of  admission showed an elevation in the glucose of 500, otherwise no  abnormalities.  Blood type is A+.  EKG done preoperatively showed normal  sinus rhythm with left atrial enlargement.  A preoperative chest x-ray  showed no active disease.   HOSPITAL COURSE:  The patient was admitted to [facility] and underwent the above-stated procedure without difficulty.  She  was then transferred to the PACU and then to the orthopedic floor for  continued  postoperative care.  At the time of surgery, one Hemovac drain was  placed.  The patient was placed on PCA analgesics and a Foley was placed at  the time of surgery.  On postoperative day #1, the patient was doing well.  The pain was well controlled on the PCA.  She stated that her lower  extremity pain was improved, however, she did not some incisional pain.  The  hemoglobin at that point was 12.0 and hematocrit 30.3.  The patient had not  passed flatus.  She was continued on clear liquids with scheduled Reglan.  PAS stockings were used for DVT prophylaxis.  PT and OT were consulted.  The  Hemovac drain was pulled.  On postoperative day #2, the patient complained  of bladder fullness and pressure.  Overnight she had only output 75 mL.  She  has a longstanding history of chronic UTIs.  The urinalysis at that point  was pending.  Of note was some sediment in the Foley bag.  The patient was  weaned from her PCA to p.o. medications.  Urology was consulted to evaluate  the patient's urinary status.  Also, a  hospitalist was consulted to  evaluate new onset diabetes.  The patient continued to not have bowel  movement.  She was placed on Dulcolax p.o. and suppositories.  Rehabilitation was consulted.  The patient was seen in consult by the [facility]  hospitalist, Deirdre Peer. Polite, M.D., and started treatment for her new onset  diabetes mellitus, type 2.  She was also seen by urology, Courtney Paris, M.D.  The patient progressed slowly with PT and OT.  On  postoperative day #5, the patient was doing well.  The pain was well  controlled.  The Foley was removed.  The patient was voiding well.  She did  have a bowel movement overnight.  Discharge planning was initiated.  The  hemoglobin at this point was 10.6 with a hematocrit of 30.0.  The patient  was afebrile.  Vital signs remained stable.  Motor and neurovascular exams  were intact.  The dressing was clean and dry.  The incision was  without evidence of infection.  The calves remained soft and nontender.  A  rehabilitation consult felt that the patient was doing well with therapy and  that she could be discharged home with home PT.  The  patient was discharged  on postoperative day #5.  She was voiding well.  Home health PT and OT were  arranged.   FOLLOW-UP:  The patient is to follow up with Jene Every, M.D., in 10-14  days.  She is to call the office for an appointment.  She is also to  schedule an appointment to follow up with Courtney Paris, M.D., in  three to four weeks.   WOUND CARE:  She is to do daily dressing changes.  She is okay to shower.   ACTIVITY:  She is to continue to be out of bed as tolerated.  Ambulation as  tolerated.  Back precautions.  No bending, twisting, stooping, or lifting.   DISCHARGE MEDICATIONS:  1. Percocet one to two p.o. q.4-6h. p.r.n. pain.  2. Robaxin one p.o. q.6h. p.r.n. spasm.  3. Vitamin C 500 mg one p.o. daily.  4. Trinsicon one p.o. t.i.d. x 30 days.  5. Amaryl 2 mg one p.o. daily.  6. Microbid 100 mg, #10, one p.o. t.i.d. with food.   DIET:  The patient was placed on an 1800 calorie ADA diet by Deirdre Peer.  Polite, M.D.   SPECIAL INSTRUCTIONS:  She was instructed to check her blood sugars twice a  day and to follow up with her primary care physician.   CONDITION ON DISCHARGE:  Stable.   FINAL DIAGNOSIS:  Status post lumbar decompression with fusion using pedicle  screws at L4-5.     Roma Schanz, P.A.                   Jene Every, M.D.    CS/MEDQ  D:  01/09/2003  T:  01/10/2003  Job:  [id number]

## 2011-03-17 NOTE — Op Note (Signed)
NAME:  Daisy Larson, Daisy Larson                      ACCOUNT NO.:  [id number]   MEDICAL RECORD NO.:  [id number]                   PATIENT TYPE:  INP   LOCATION:  2550                                 FACILITY:  [facility]   PHYSICIAN:  Jene Every, M.D.                 DATE OF BIRTH:  1950/12/24   DATE OF PROCEDURE:  12/10/2002  DATE OF DISCHARGE:                                 OPERATIVE REPORT   PREOPERATIVE DIAGNOSIS:  Degenerative spondylolisthesis at L4-5, grade 1.   POSTOPERATIVE DIAGNOSES:  1. Degenerative spondylolisthesis at L4-5, grade 1.  2. 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   PROCEDURE PERFORMED:  1. Lumbar decompression L4-5  2. Bilateral L4 nerve root and L5 nerve root decompressions.  3. Lateral mass fusion, pedicle screw instrumentation.  4. Lateral mass fusion utilizing local bone and allograft and AlloMatrix     bone graft.  5. Intraoperative free running neuromonitoring.   ANESTHESIA:  General.   SURGEON:  Javier Docker, M.D.   ASSISTANT:  1. Sharolyn Douglas, M.D.  2. Roma Schanz, [place]   BRIEF HISTORY AND INDICATIONS:  A 60 year old, refractory lower extremity  radicular pain secondary to degenerative spondylolisthesis, L4 and L5 nerve  root compression.  Operative intervention was indicated for decompression  and stabilization of spondylolisthesis.  Risks and benefits discussed  including bleeding, infection, damage to vascular structures, CSF leakage,  epidural fibrosis, adjacent segment disease, need for repeat decompression,  etc.   TECHNIQUE:  The patient in supine position.  After the induction of adequate  general anesthesia and 1 g of Kefzol, she was placed prone on the [place]  table.  All bony prominences well-padded.  Abdomen was free.  Breasts were  free.  Lumbar region is prepped and draped in the usual sterile fashion.  An  incision was made from the spinous process to L4 and S1.  Subcutaneous  tissue was dissected.  Electrocautery was utilized to  achieve hemostasis.  The dorsolumbar fascia identified and divided in line with the skin  incision.  Paraspinous muscles elevated from the lamina L3, L4, and L5.  Identified the facet of L4-5 and at L3-4, decapsulated L5.  Hypertrophic  facet was noted, and hypermobility was noted.  Removed the spinous ligament  at L4-5, removed the spinous processes of L5 and partial of L4 morselized  and saved that for bone.  Also used bursal medial hemifacetectomies for the  bone as well.  We identified a tendinous processes in the lateral _________.  At intersection of the transverse process and superior articulating process  of the facet joint at the process of L4 and at L5, we placed our pedicle  screws, first finding with an awl in the entrance points as previously  mentioned.  Pedicle probe was utilized in the appropriate version using x-  ray guiding and then converging in the appropriate version.  We measured  both to be 45 at  L4 and 40 at L5.  Performed the decompression in the center  with removal of ligamentum flavum with foraminotomy utilizing the operating  microscope.  There was severe compression of the L4 nerve root in the  foramen on the left.  There was a pars defect noted there with hypertrophic  fibrocartilaginous tissue.  This was removed with a 2 mm and 3 mm Kerrison.  Decompressive lateral recess at L5 and the L5 and the L4 nerve roots  bilaterally.  Disk was inspected with no evidence of herniation.  Again, the  pedicles were probed with hockey-stick visually, and pedicle screws were  inserted 45 mm at L4 and 40 mm at L5 under direct visualization without  broaching.  AP and lateral plane, the area visualized and found to be well  convergent in the pedicle.  Intraoperative neural monitoring was utilized.  We tested the screws, and the nerve roots were at L5 of 1 mA and L4 of 1 mA  as well.  The pedicles placed at L4 were 25 and at L5 were 17 on the right  and 12 on the left.  Free  running intraoperative neuromonitoring was  utilized as well.  Wound copiously irrigated once again.  Bipolar  electrocautery was utilized to achieve strict hemostasis.   Next, again wound copiously irrigated.  Hockey-stick probe placed in the  foramen of L4 and L5 and found them to be widely patent.  Pedicle screws  inserted.  They were connected with the 50 mm rods connecting with a set  screw inside to the appropriate torquing.  In the AP and lateral plane, they  were found to be satisfactory.  Again, hockey-stick probe placed in the  foramen of L4 and L5 and found to be widely patent.  There was no broaching  of the canal.  There was good convergence noted on x-ray.   Again, the wound copiously irrigated.  We had decorticated the transverse  processes and the pars bilaterally in the lateral aspect of the facet,  utilized autograft cancellous bone into the lateral recess after the  decortication.  We also augmented that with AlloMatrix bone graft.  Placed  thrombin-soaked Gelfoam in the laminotomy defect.  Wound copiously irrigated  once again.  Inspection revealed no evidence of active bleeding or CSF  leakage.  The thecal sac was well pulsatile with very good reduction of the  spondylolisthesis.  Next, placed a Hemovac, brought up through a lateral  stab wound in the skin with #1 Vicryl interrupted figure-of-eight sutures  utilized to close the dorsolumbar fascia watertight and then 0 and 2-0  Vicryl simple sutures and skin staples.  We had on the left at L5, removed  the pedicle and reinserted it, probing prior to all insertions to verify the  placement of that screw.  After the sterile dressing was applied and closure  was performed, she was placed supine on the hospital bed, extubated without  difficulty, and transported to the recovery room in satisfactory condition.   The patient tolerated the procedure well with no complications.  Blood loss  250 mL.                                               Jene Every, M.D.    Cordelia Pen  D:  12/10/2002  T:  12/10/2002  Job:  [id number]

## 2011-03-17 NOTE — H&P (Signed)
NAME:  Daisy Larson, Daisy Larson                      ACCOUNT NO.:  [id number]   MEDICAL RECORD NO.:  [id number]                   PATIENT TYPE:   LOCATION:                                       FACILITY:  [facility]   PHYSICIAN:  Jene Every, M.D.                 DATE OF BIRTH:  1951-01-22   DATE OF ADMISSION:  12/10/2002  DATE OF DISCHARGE:                                HISTORY & PHYSICAL   CHIEF COMPLAINT:  Bilateral lower extremity pain.   HISTORY OF PRESENT ILLNESS:  The patient is a 60 year old female who first  presented to our office in 10/03, with complaint of left lower extremity  pain with numbness and tingling.  The patient has had a long-standing  history of low back pain with intermittent lower extremity radicular pain.  However, in 10/03, this began to get significantly worse.  When seen in our  office in October, the patient did have a positive straight leg raise  bilaterally.  EHL was 5/5.  She did have decreased sensation on the L5  dermatome.  MRI of the lumbar spine showed an annular __________ and tiny  disk protrusion at L4-5 with severe facet degenerative disease.  It was  recommended that the patient undergo epidural steroid injections.  She did  get some relief with the first.  She underwent the second.  Unfortunately,  this did not give the patient significant relief.  CT myelogram was obtained  to better delineate the patient's pathology.  The CT showed instability of  L4-5, there was a cut off of the L4 nerve root on the left secondary to disk  protrusion and spondylolisthesis.  Due to the disabling nature of the  patient's back pain and lower extremity radicular pain, it was felt that she  would benefit from a decompression of L4-5 with a posterior lateral effusion  and pedicle screw instrumentation with possible TLIF.  Risks and benefits of  the surgery were discussed with the patient.  She wishes to proceed.   PAST MEDICAL HISTORY:  1. Migraines.  2.  Borderline hypertension.   PAST SURGICAL HISTORY:  1. Tonsillectomy.  2. Appendectomy.  3. Cesarean section.  4. Tubal ligation.  5. Partial hysterectomy.  6. Left carpal tunnel release.   CURRENT MEDICATIONS:  1. Inderal LA caps 60 mg one p.o. daily.  2. Premarin 0.3 mg one p.o. daily.  3. Zyrtec 10 mg one p.o. daily.  4. Vicodin p.r.n. pain.  5. Mobic p.r.n.  6. Duradrin p.r.n. migraines.   ALLERGIES:  ADHESIVE TAPE which causes blisters.  Paper tape is okay.   SOCIAL HISTORY:  The patient is married.  She denies any tobacco or alcohol  use.  Her husband will be her caregiver following surgery.  They live in a  one story home with a couple of entrance stairs leading to the home.   FAMILY HISTORY:  Significant for coronary artery disease on  the maternal  side.  Both sisters have hypertension.  Also a history of diabetes on the  maternal side.  Mother is deceased of a rare form of cancer.   REVIEW OF SYMPTOMS:  GENERAL:  No fever, chills, night sweats, or bleeding  tendencies.  CNS:  No blurry or double vision, seizure, headache, or  paralysis.  RESPIRATORY:  No shortness of breath, productive cough, or  hemoptysis.  CARDIOVASCULAR:  No chest pain, angina, or orthopnea.  GASTROINTESTINAL:  No nausea, vomiting, diarrhea, constipation, melena, or  bloody stools.  GENITOURINARY:  Occasional nocturia.  No dysuria or  hematuria or discharge.  MUSCULOSKELETAL:  Pertinent in HPI.   PHYSICAL EXAMINATION:  VITAL SIGNS:  Pulse is 88, respiratory rate 20, blood  pressure is 150/100.  GENERAL:  This is a well-developed, well-nourished 60 year old female in  mild distress.  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  HEENT:  Normocephalic, atraumatic.  Pupils equal, round, reactive to light.  Extraocular movements were intact.  NECK:  Supple with no lymphadenopathy.  CHEST:  Clear to auscultation bilaterally.  No wheezes, rhonchi, or rales.  HEART:  Regular rate and rhythm without murmurs,  rubs, or gallops.  ABDOMEN:  Soft, nontender, nondistended, bowel sounds x4.  BREASTS:  Not examined, not pertinent to HPI.  GENITOURINARY:  Not examined, not pertinent to HPI.  SKIN:  No rashes or lesions are noted.  BACK:  Examination of the lumbar spine:  The patient is standing upright in  moderate distress.  She walks with an antalgic gait.  Straight leg raise on  the left produces buttocks, posterior thigh, and calf pain.  On the right,  produces buttocks pain.  The patient has pain with forward flexion and  extension.  Quadriceps is 5/-5, there is altered sensation along the L4  dermatome.   IMPRESSION:  1. Spondylolisthesis of L4-5.  2. Migraines.  3. Borderline hypertension.   PLAN:  The patient will be admitted to [facility] on 12/10/02, to  undergo decompression of L4-5 with posterior lateral fusion, pedicle screw  instrumentation with possible TLIF.       Roma Schanz, P.A.                   Jene Every, M.D.    CS/MEDQ  D:  12/03/2002  T:  12/03/2002  Job:  [id number]

## 2011-03-28 HISTORY — PX: LIVER BIOPSY: SHX301

## 2011-04-20 ENCOUNTER — Other Ambulatory Visit: Payer: Self-pay | Admitting: Endocrinology

## 2011-04-20 DIAGNOSIS — E049 Nontoxic goiter, unspecified: Secondary | ICD-10-CM

## 2011-04-24 ENCOUNTER — Ambulatory Visit
Admission: RE | Admit: 2011-04-24 | Discharge: 2011-04-24 | Disposition: A | Discharge status: Home / Self Care | Payer: BC Managed Care – PPO | Source: Ambulatory Visit | Attending: Endocrinology | Admitting: Endocrinology

## 2011-04-24 DIAGNOSIS — E049 Nontoxic goiter, unspecified: Secondary | ICD-10-CM

## 2011-05-05 ENCOUNTER — Other Ambulatory Visit: Payer: Self-pay | Admitting: Endocrinology

## 2011-05-05 DIAGNOSIS — E049 Nontoxic goiter, unspecified: Secondary | ICD-10-CM

## 2011-11-06 ENCOUNTER — Ambulatory Visit
Admission: RE | Admit: 2011-11-06 | Discharge: 2011-11-06 | Disposition: A | Discharge status: Home / Self Care | Payer: BC Managed Care – PPO | Source: Ambulatory Visit | Attending: Endocrinology | Admitting: Endocrinology

## 2011-11-06 DIAGNOSIS — E049 Nontoxic goiter, unspecified: Secondary | ICD-10-CM

## 2012-01-03 ENCOUNTER — Other Ambulatory Visit: Payer: Self-pay | Admitting: Radiology

## 2012-01-04 ENCOUNTER — Other Ambulatory Visit: Payer: Self-pay | Admitting: Radiology

## 2012-01-04 DIAGNOSIS — C50911 Malignant neoplasm of unspecified site of right female breast: Secondary | ICD-10-CM

## 2012-01-05 ENCOUNTER — Telehealth: Payer: Self-pay | Admitting: Oncology

## 2012-01-05 NOTE — Telephone Encounter (Signed)
I called the patient about scheduling her [facility] consult.  She verbalized understanding.

## 2012-01-09 ENCOUNTER — Ambulatory Visit
Admission: RE | Admit: 2012-01-09 | Discharge: 2012-01-09 | Disposition: A | Discharge status: Home / Self Care | Payer: BC Managed Care – PPO | Source: Ambulatory Visit | Attending: Radiology | Admitting: Radiology

## 2012-01-09 ENCOUNTER — Other Ambulatory Visit: Payer: Self-pay | Admitting: *Deleted

## 2012-01-09 DIAGNOSIS — C50911 Malignant neoplasm of unspecified site of right female breast: Secondary | ICD-10-CM

## 2012-01-09 DIAGNOSIS — C50419 Malignant neoplasm of upper-outer quadrant of unspecified female breast: Secondary | ICD-10-CM

## 2012-01-09 DIAGNOSIS — C50411 Malignant neoplasm of upper-outer quadrant of right female breast: Secondary | ICD-10-CM | POA: Insufficient documentation

## 2012-01-09 MED ORDER — GADOBENATE DIMEGLUMINE 529 MG/ML IV SOLN
20.0000 mL | Freq: Once | INTRAVENOUS | Status: AC | PRN
  Administered 2012-01-09: 20 mL via INTRAVENOUS

## 2012-01-10 ENCOUNTER — Ambulatory Visit (HOSPITAL_BASED_OUTPATIENT_CLINIC_OR_DEPARTMENT_OTHER): Payer: BC Managed Care – PPO | Admitting: General Surgery

## 2012-01-10 ENCOUNTER — Other Ambulatory Visit (INDEPENDENT_AMBULATORY_CARE_PROVIDER_SITE_OTHER): Payer: Self-pay | Admitting: General Surgery

## 2012-01-10 ENCOUNTER — Other Ambulatory Visit: Payer: Self-pay | Admitting: Oncology

## 2012-01-10 ENCOUNTER — Telehealth: Payer: Self-pay | Admitting: Oncology

## 2012-01-10 ENCOUNTER — Ambulatory Visit: Payer: BC Managed Care – PPO

## 2012-01-10 ENCOUNTER — Ambulatory Visit: Payer: BC Managed Care – PPO | Attending: General Surgery | Admitting: Physical Therapy

## 2012-01-10 ENCOUNTER — Ambulatory Visit (HOSPITAL_BASED_OUTPATIENT_CLINIC_OR_DEPARTMENT_OTHER): Payer: BC Managed Care – PPO | Admitting: Oncology

## 2012-01-10 ENCOUNTER — Encounter: Payer: Self-pay | Admitting: Radiation Oncology

## 2012-01-10 ENCOUNTER — Encounter (INDEPENDENT_AMBULATORY_CARE_PROVIDER_SITE_OTHER): Payer: Self-pay | Admitting: General Surgery

## 2012-01-10 ENCOUNTER — Other Ambulatory Visit (HOSPITAL_BASED_OUTPATIENT_CLINIC_OR_DEPARTMENT_OTHER): Payer: BC Managed Care – PPO | Admitting: Lab

## 2012-01-10 ENCOUNTER — Encounter: Payer: Self-pay | Admitting: Oncology

## 2012-01-10 ENCOUNTER — Ambulatory Visit
Admission: RE | Admit: 2012-01-10 | Discharge: 2012-01-10 | Disposition: A | Discharge status: Home / Self Care | Payer: BC Managed Care – PPO | Source: Ambulatory Visit | Attending: Radiation Oncology | Admitting: Radiation Oncology

## 2012-01-10 VITALS — BP 141/77 | HR 73 | Temp 97.1°F | Ht 67.6 in | Wt 264.8 lb

## 2012-01-10 DIAGNOSIS — C50419 Malignant neoplasm of upper-outer quadrant of unspecified female breast: Secondary | ICD-10-CM

## 2012-01-10 DIAGNOSIS — Z170 Estrogen receptor positive status [ER+]: Secondary | ICD-10-CM

## 2012-01-10 DIAGNOSIS — IMO0001 Reserved for inherently not codable concepts without codable children: Secondary | ICD-10-CM | POA: Insufficient documentation

## 2012-01-10 DIAGNOSIS — C50911 Malignant neoplasm of unspecified site of right female breast: Secondary | ICD-10-CM

## 2012-01-10 DIAGNOSIS — C50919 Malignant neoplasm of unspecified site of unspecified female breast: Secondary | ICD-10-CM

## 2012-01-10 DIAGNOSIS — R293 Abnormal posture: Secondary | ICD-10-CM | POA: Insufficient documentation

## 2012-01-10 LAB — COMPREHENSIVE METABOLIC PANEL
Albumin: 3.2 g/dL — ABNORMAL LOW (ref 3.5–5.2)
Alkaline Phosphatase: 129 U/L — ABNORMAL HIGH (ref 39–117)
BUN: 10 mg/dL (ref 6–23)
CO2: 30 mEq/L (ref 19–32)
Glucose, Bld: 263 mg/dL — ABNORMAL HIGH (ref 70–99)
Potassium: 4 mEq/L (ref 3.5–5.3)
Sodium: 138 mEq/L (ref 135–145)
Total Protein: 7.1 g/dL (ref 6.0–8.3)

## 2012-01-10 LAB — CBC WITH DIFFERENTIAL/PLATELET
Basophils Absolute: 0 10*3/uL (ref 0.0–0.1)
Eosinophils Absolute: 0.1 10*3/uL (ref 0.0–0.5)
HGB: 11.8 g/dL (ref 11.6–15.9)
LYMPH%: 23.7 % (ref 14.0–49.7)
MCV: 90.6 fL (ref 79.5–101.0)
MONO#: 0.3 10*3/uL (ref 0.1–0.9)
MONO%: 7.3 % (ref 0.0–14.0)
NEUT#: 3.2 10*3/uL (ref 1.5–6.5)
Platelets: 120 10*3/uL — ABNORMAL LOW (ref 145–400)
RDW: 14.9 % — ABNORMAL HIGH (ref 11.2–14.5)
WBC: 4.8 10*3/uL (ref 3.9–10.3)

## 2012-01-10 NOTE — Telephone Encounter (Signed)
gve the pt her June 2013 appt calendar 

## 2012-01-10 NOTE — Progress Notes (Signed)
[facility] Radiation Oncology NEW PATIENT EVALUATION  Name: Daisy Larson MRN: [id number]  Date: 01/10/2012  DOB: 08-24-1951  Status: outpatient   DIAGNOSIS: The encounter diagnosis was Cancer of upper-outer quadrant of female breast. 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 female with multiple comorbidities who was seen for her 6 month followup mammogram of the right breast and was shown to have interval growth in a nodular density of the right upper outer quadrant.Core needle biopsy was performed on 01/03/2012. This demonstrated invasive ductal carcinoma which is ER and PR positive and HER-2/neu negative. It is grade 2. The patient is status post MRI of her breasts performed on 01/09/2012. The 1.3 cm lesion is appreciated in the bilateral right breast. This is a solitary finding.  She has multiple comorbidities. Recently she has suffered from bundle branch block,nodules on her liver which were biopsied and found to be consistent with cirrhosis, thyroid nodules which are stable on trasound, stomach ulcers, hernias, and diabetic neuropathy.  PREVIOUS RADIATION THERAPY: No   PAST MEDICAL HISTORY:  has a past medical history of Bundle branch block; Cirrhosis; Multiple thyroid nodules; History of stomach ulcers; Hernia; Colon polyp; Neuropathy of foot; and Restless legs.  additionally the patient has a history of dysplastic cells of the tongue which were treated with laser surgery several years ago   PAST SURGICAL HISTORY:  Past Surgical History  Procedure Date  . Appendectomy   . Tonsillectomy   . Cesarean section   . Tubal ligation   . Abdominal hysterectomy   . Back surgery   . Hernia repair   . Liver biopsy    Gynecologic history the patient has 2 children. She has carried two children to term. Age at first live birth 27. Age at first menstrual  period was 86. Approximate age at last period was 1. She was on hormone replacement in the past but she doesn't know for how long. She stopped hormone replacement at diagnosis of her breast cancer.  FAMILY HISTORY: family history includes Cancer in her maternal grandfather and paternal grandfather. She denies any history of ovarian or breast cancer in her family. She has 3 sisters.  SOCIAL HISTORY:  reports that she has never smoked. She does not have any smokeless tobacco history on file. She reports that she drinks alcohol. She reports that she does not use illicit drugs. she does report a history of secondhand smoke exposure. Her husband is recovering from a heart transplant which took place about 20 months ago.  ALLERGIES: Tape   MEDICATIONS:  Current Outpatient Prescriptions  Medication Sig Dispense Refill  . aspirin 81 MG tablet Take 81 mg by mouth daily.      . B Complex-C (SUPER B COMPLEX PO) Take 1 tablet by mouth daily.      Marland Kitchen buPROPion (WELLBUTRIN XL) 300 MG 24 hr tablet       . Calcium Carbonate (CALCIUM 500 PO) Take 1 tablet by mouth daily.      . cetirizine (ZYRTEC) 10 MG tablet Take 10 mg by mouth daily.      . Cholecalciferol (VITAMIN D3) 3000 UNITS TABS Take 1,000 mg by mouth daily.      . DULoxetine (CYMBALTA) 60 MG capsule Take 60 mg by mouth daily.      Marland Kitchen gabapentin (NEURONTIN) 800 MG tablet       . glimepiride (  AMARYL) 4 MG tablet       . Liraglutide (VICTOZA [place]) Inject 1.8 mg into the skin daily.      Marland Kitchen lisinopril (PRINIVIL,ZESTRIL) 20 MG tablet       . LORazepam (ATIVAN) 2 MG tablet Take 2 mg by mouth every 6 (six) hours as needed.      . meclizine (ANTIVERT) 25 MG tablet Take 12.5 mg by mouth 3 (three) times daily as needed.      . metFORMIN (GLUCOPHAGE-XR) 500 MG 24 hr tablet       . METHOCARBAMOL PO Take 500 mg by mouth daily as needed.      . MULTIPLE VITAMIN PO Take 1 tablet by mouth daily.      . NON FORMULARY Take 1 tablet by mouth daily. CINSULIN      .  pramipexole (MIRAPEX) 0.5 MG tablet       . pravastatin (PRAVACHOL) 40 MG tablet       . propranolol (INDERAL LA) 60 MG 24 hr capsule          REVIEW OF SYSTEMS:  A 14 point review of systems as obtained and reviewed with the patient and placed in her chart. It is notable for that above.    PHYSICAL EXAM:  vitals were not taken for this visit.  General: Alert and oriented heavyset woman, in no acute distress HEENT: Head is normocephalic. Pupils are equally round and reactive to light. Extraocular movements are intact. Oropharynx is clear but somewhat dry. Neck: Neck is supple, no palpable cervical or supraclavicular lymphadenopathy. Heart: Regular in rate and rhythm  Chest: Clear to auscultation bilaterally, with no rhonchi, wheezes, or rales. Abdomen: Soft, nontender, nondistended, with no rigidity or guarding. Extremities: No cyanosis, no upper extremity edema. Lymphatics: No concerning lymphadenopathy. Skin: No concerning lesions. Musculoskeletal: symmetric strength and muscle tone throughout. Neurologic: Cranial nerves II through XII are grossly intact. No obvious focalities. Speech is fluent. Coordination is intact. Psychiatric: Judgment and insight are intact. Affect is appropriate. Breasts: she has large breasts and may benefit from being treated in the prone position. In the upper outer quadrant of the right breast there is a biopsy scar but no obvious palpable tumor that I can appreciate. I cannot appreciate any palpable lymphadenopathy in the axillary regions bilaterally.     LABORATORY DATA:  Lab Results  Component Value Date   WBC 4.8 01/10/2012   HGB 11.8 01/10/2012   HCT 35.0 01/10/2012   MCV 90.6 01/10/2012   PLT 120* 01/10/2012   CMP     Component Value Date/Time   NA 138 01/10/2012 1226   K 4.0 01/10/2012 1226   CL 102 01/10/2012 1226   CO2 30 01/10/2012 1226   GLUCOSE 263* 01/10/2012 1226   BUN 10 01/10/2012 1226   CREATININE 0.57 01/10/2012 1226   CALCIUM 9.4  01/10/2012 1226   PROT 7.1 01/10/2012 1226   ALBUMIN 3.2* 01/10/2012 1226   AST 58* 01/10/2012 1226   ALT 42* 01/10/2012 1226   ALKPHOS 129* 01/10/2012 1226   BILITOT 0.4 01/10/2012 1226   PATHOLOGY: As above   RADIOLOGY: As above   IMPRESSION/PLAN: This is a very pleasant 61 year old woman with T1 C. N0 M0 right breast cancer. We spoke about breast conservation versus mastectomy. She understood that her life expectancy would not be changed by her surgical decision. She is interested in breast conservation therapy. Based on the biologics of her tumor I doubt that she will need chemotherapy but we will  review the pathology and ultimately decide on this postoperatively. Assuming she does not need any chemotherapy when she is healed from surgery, she can proceed with radiation treatment planning. Due to her large frame I do not think I would hyporfractionate her treatment, rather I would probably treat her to a conventional dose of 50 Gray in 25 fractions. I may treat her in the prone position due to her body habitus. Due to her age, I do not think she would benefit significantly from a boost unless she has close margins.  She will be a candidate for antiestrogen therapy in the future.  It was a pleasure meeting the patient today. We discussed the risks, benefits, and side effects of radiotherapy. No guarantees of treatment were given. The patient is enthusiastic about proceeding with treatment. I look forward to participating in the patient's care.

## 2012-01-10 NOTE — Progress Notes (Signed)
ID: Daisy Larson   DOB: [birth year]  MR#: [id number]  CSN#:[id number]  HISTORY OF PRESENT ILLNESS: The patient had annual mammography 12/29/2011 at [facility], showing  an 11 mm focal nodular density in the upper outer quadrant of the right breast which had increased in size as compared to the prior study a year prior. Ultrasound showed no sonographic abnormality. Stereotactic biopsy was performed 01/03/2012 the month and the pathology showed ([id number]) and invasive ductal carcinoma, grade 2, with extracellular mucin, 100% estrogen receptor and progesterone receptor positive, with an MIB-1 of 8 and no HER-2 amplification. The patient underwent bilateral breast MRI 01/09/2012 showing a solitary enhancing mass in the lateral aspect of the right breast measuring 1.3 cm. There was no enlarged axillary or internal mammary adenopathy and no other abnormality was noted.  The patient was seen at the multidisciplinary breast cancer clinic 01/10/2012 with further treatments as detailed below  REVIEW OF SYSTEMS: Her review of systems was stable at the time of mammography and she had no special symptoms leading to that routine test. She has some chronic issues including insomnia, moderate fatigue, discomfort in her back, right arm, and chest, epistaxis which she describes as mild, GERD, occasional chest discomfort not directly related to activity, history of easy bruising, forgetfulness, neuropathy, anxiety, depression, and of course her chronic problems with diabetes. Overall she reports no symptoms worrisome for metastatic disease.  PAST MEDICAL HISTORY: Past Medical History  Diagnosis Date  . Bundle branch block   . Cirrhosis   . Multiple thyroid nodules   . History of stomach ulcers   . Hernia   . Colon polyp   . Neuropathy of foot   . Restless legs   She underwent liver biopsy 03/28/2011, at [facility], showing evidence of early cirrhosis. There was mildly active chronic hepatitis (grade 1).  She also has a history of thyroid nodules which have been stable on serial ultrasounds according to the patient  PAST SURGICAL HISTORY: Past Surgical History  Procedure Date  . Appendectomy   . Tonsillectomy   . Cesarean section   . Tubal ligation   . Abdominal hysterectomy   . Back surgery   . Hernia repair   . Liver biopsy     FAMILY HISTORY Family History  Problem Relation Age of Onset  . Cancer Maternal Grandfather   . Cancer Paternal Grandfather    the patient's father died at the age of 67, following a fall. The patient's mother died at the age of 9. 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 The patient had no brothers, 3 sisters, one of whom is present at the initial visit. There is no history of breast or ovarian cancer in the family, but one niece was diagnosed with colon cancer at the age of 32. Her paternal grandfather died from colon cancer. Her maternal grandfather died from lung cancer.  GYNECOLOGIC HISTORY: She had menarche at age 8. She went through menopause approximately age 9. She took hormone replacement on total recently. She is GX P2, first pregnancy to term age 66.  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 Her husband of 43 years, Daisy Larson, he is a Education officer, environmental. He underwent heart transplant at [facility] at 20 months ago and is doing very well. Their children are in [place] L[place] 42 [place] in [place] and is a Chief Financial Officer, and Daisy Larson, 38, also living in [place], who works as a Arts administrator  medical assistant. The patient has one grandchild. She attends an independent [employer]    ADVANCED DIRECTIVES: in place  HEALTH MAINTENANCE: History  Substance Use Topics  . Smoking status: Never Smoker   . Smokeless tobacco: Not on file  . Alcohol Use: Yes     Colonoscopy: 2011  PAP: 2012  Bone density: 2003  Lipid panel:  Allergies  Allergen Reactions  . Tape      Current Outpatient Prescriptions  Medication Sig Dispense Refill  . aspirin 81 MG tablet Take 81 mg by mouth daily.      . B Complex-C (SUPER B COMPLEX PO) Take 1 tablet by mouth daily.      Marland Kitchen buPROPion (WELLBUTRIN XL) 300 MG 24 hr tablet       . Calcium Carbonate (CALCIUM 500 PO) Take 1 tablet by mouth daily.      . cetirizine (ZYRTEC) 10 MG tablet Take 10 mg by mouth daily.      . Cholecalciferol (VITAMIN D3) 3000 UNITS TABS Take 1,000 mg by mouth daily.      . DULoxetine (CYMBALTA) 60 MG capsule Take 60 mg by mouth daily.      Marland Kitchen gabapentin (NEURONTIN) 800 MG tablet       . glimepiride (AMARYL) 4 MG tablet       . Liraglutide (VICTOZA [place]) Inject 1.8 mg into the skin daily.      Marland Kitchen lisinopril (PRINIVIL,ZESTRIL) 20 MG tablet       . LORazepam (ATIVAN) 2 MG tablet Take 2 mg by mouth every 6 (six) hours as needed.      . meclizine (ANTIVERT) 25 MG tablet Take 12.5 mg by mouth 3 (three) times daily as needed.      . metFORMIN (GLUCOPHAGE-XR) 500 MG 24 hr tablet       . METHOCARBAMOL PO Take 500 mg by mouth daily as needed.      . MULTIPLE VITAMIN PO Take 1 tablet by mouth daily.      . NON FORMULARY Take 1 tablet by mouth daily. CINSULIN      . pramipexole (MIRAPEX) 0.5 MG tablet       . pravastatin (PRAVACHOL) 40 MG tablet       . propranolol (INDERAL LA) 60 MG 24 hr capsule         OBJECTIVE: Middle-aged white woman who appears comfortable Filed Vitals:   01/10/12 1250  BP: 141/77  Pulse: 73  Temp: 97.1 F (36.2 C)     Body mass index is 40.74 kg/(m^2).    ECOG FS: 1  Sclerae unicteric Oropharynx clear No peripheral adenopathy Lungs no rales or rhonchi Heart regular rate and rhythm Abd obese, benign MSK no focal spinal tenderness, no joint swelling or effusion Neuro: nonfocal Breasts: The right breast is status post recent biopsy. There is a minimal ecchymosis. I do not palpate a well-defined mass. There is no other skin change or nipple retraction. The left breast is  unremarkable  LAB RESULTS: Lab Results  Component Value Date   WBC 4.8 01/10/2012   NEUTROABS 3.2 01/10/2012   HGB 11.8 01/10/2012   HCT 35.0 01/10/2012   MCV 90.6 01/10/2012   PLT 120* 01/10/2012      Chemistry      Component Value Date/Time   NA 138 01/10/2012 1226   K 4.0 01/10/2012 1226   CL 102 01/10/2012 1226   CO2 30 01/10/2012 1226   BUN 10 01/10/2012 1226   CREATININE 0.57 01/10/2012 1226  Component Value Date/Time   CALCIUM 9.4 01/10/2012 1226   ALKPHOS 129* 01/10/2012 1226   AST 58* 01/10/2012 1226   ALT 42* 01/10/2012 1226   BILITOT 0.4 01/10/2012 1226       No results found for this basename: LABCA2    No components found with this basename: [id number]    No results found for this basename: INR:1;PROTIME:1 in the last 168 hours  Urinalysis No results found for this basename: colorurine, appearanceur, labspec, phurine, glucoseu, hgbur, bilirubinur, ketonesur, proteinur, urobilinogen, nitrite, leukocytesur    STUDIES: Mr Breast Bilateral W Wo Contrast  01/09/2012  *RADIOLOGY REPORT*  Clinical Data: Recently diagnosed invasive ductal carcinoma with extracellular mucin of the right breast.  BUN and creatinine were obtained on site at [facility] at [address] Results:  BUN 8 mg/dL,  Creatinine 0.7 mg/dL.  BILATERAL BREAST MRI WITH AND WITHOUT CONTRAST  Technique: Multiplanar, multisequence MR images of both breasts were obtained prior to and following the intravenous administration of 20ml of multihance.  Three dimensional images were evaluated at the independent Dyna[employer]ad workstation.  Comparison:  Mammograms dated 01/03/2012, 12/29/2011, 07/10/2011, 01/02/2011 and 12/28/2010 from [facility].  Findings: There is a minimal background parenchymal enhancement pattern.  There is a solitary enhancing mass in the lateral aspect of the right breast measuring 1.1 x 1.3 x 0.8 cm corresponding well with the known invasive ductal carcinoma.  It is associated,  with a biopsy clip artifact as well as biopsy changes.  There is no enlarged axillary or internal mammary adenopathy.  No abnormal enhancement is seen in the left breast.  IMPRESSION: Solitary enhancing 1.3 cm mass in the lateral aspect of the right breast corresponding well with the known malignancy.  Treatment planning is recommended.  THREE-DIMENSIONAL MR IMAGE RENDERING ON INDEPENDENT WORKSTATION:  Three-dimensional MR images were rendered by post-processing of the original MR data on an independent workstation.  The three- dimensional MR images were interpreted, and findings were reported in the accompanying complete MRI report for this study.  BI-RADS CATEGORY 6:  Known biopsy-proven malignancy - appropriate action should be taken.  Original Report Authenticated By: Littie Deeds. ARCEO, M.D.    ASSESSMENT: 61 year [place] status post right breast biopsy 01/03/2012 for a clinical T1CN0, stage I invasive ductal carcinoma, grade 2, strongly estrogen and progesterone receptor positive, with an MIB of 8, and no HER-2 amplification.  PLAN: The plan is to start with breast conserving surgery. From a systemic treatment point of view, the prognostic panel suggests a luminal a type of breast cancer, and I anticipate the benefit from chemotherapy would be minimal. If her lymph nodes are negative, I do not feel an Oncotype would add much to our decision-making, even though that is suggested by the NCCN guidelines.  Accordingly most likely she will have surgery followed by radiation and I have made her return appointment with me on that assumption. By the time she sees me she should be just about done with radiation and we will likely start aromatase inhibitors at that point. She knows to call for any problems that may develop before the next visit   Aniko Finnigan C    01/10/2012

## 2012-01-10 NOTE — Progress Notes (Signed)
Patient ID: Daisy Larson, female   DOB: 1951-10-14, 61 y.o.   MRN: [id number]  No chief complaint on file.   HPI Daisy Larson is a 61 y.o. female.  HPI this patient is seen in the multidisciplinary breast clinic today for evaluation of a newly diagnosed right breast cancer found on annual mammogram.  she has a history of a density seen previously on mammogram approximately one year ago and this was followed up 6 months later with imaging and it was thought to have no change from the prior images. She recently underwent her annual mammograms and noticed some increased density in the area of concern and this was followed up with ultrasound which does not demonstrate any abnormality. She had an MRI which demonstrated a 1.3 cm area of enhancement in the area of known tumor. 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 She says that she does her self breast exam and has not noticed any changes on physical exam. She also denies any systemic symptoms such as headaches, bony pains, or weight loss. She does say that she has been on Premarin and with her last surgery she also had to stay overnight for observation due to desaturations. She is followed by Dr. Rudolpho Sevin in the [place] as her cardiologist.  Past Medical History  Diagnosis Date  . Bundle branch block   . Cirrhosis   . Multiple thyroid nodules   . History of stomach ulcers   . Hernia   . Colon polyp   . Neuropathy of foot   . Restless legs   Diabetes  Past Surgical History  Procedure Date  . Appendectomy   . Tonsillectomy   . Cesarean section   . Tubal ligation   . Abdominal hysterectomy   . Back surgery   . Hernia repair   . Liver biopsy     Family History  Problem Relation Age of Onset  . Cancer Maternal Grandfather   . Cancer Paternal Grandfather     Social History History  Substance Use Topics  . Smoking status: Never Smoker   .  Smokeless tobacco: Not on file  . Alcohol Use: Yes    Allergies  Allergen Reactions  . Tape     Current Outpatient Prescriptions  Medication Sig Dispense Refill  . aspirin 81 MG tablet Take 81 mg by mouth daily.      . B Complex-C (SUPER B COMPLEX PO) Take 1 tablet by mouth daily.      Marland Kitchen buPROPion (WELLBUTRIN XL) 300 MG 24 hr tablet       . Calcium Carbonate (CALCIUM 500 PO) Take 1 tablet by mouth daily.      . cetirizine (ZYRTEC) 10 MG tablet Take 10 mg by mouth daily.      . Cholecalciferol (VITAMIN D3) 3000 UNITS TABS Take 1,000 mg by mouth daily.      . DULoxetine (CYMBALTA) 60 MG capsule Take 60 mg by mouth daily.      Marland Kitchen gabapentin (NEURONTIN) 800 MG tablet       . glimepiride (AMARYL) 4 MG tablet       . Liraglutide (VICTOZA [place]) Inject 1.8 mg into the skin daily.      Marland Kitchen lisinopril (PRINIVIL,ZESTRIL) 20 MG tablet       . LORazepam (ATIVAN) 2 MG tablet Take 2 mg by mouth every 6 (six) hours as needed.      . meclizine (ANTIVERT) 25 MG  tablet Take 12.5 mg by mouth 3 (three) times daily as needed.      . metFORMIN (GLUCOPHAGE-XR) 500 MG 24 hr tablet       . METHOCARBAMOL PO Take 500 mg by mouth daily as needed.      . MULTIPLE VITAMIN PO Take 1 tablet by mouth daily.      . NON FORMULARY Take 1 tablet by mouth daily. CINSULIN      . pramipexole (MIRAPEX) 0.5 MG tablet       . pravastatin (PRAVACHOL) 40 MG tablet       . propranolol (INDERAL LA) 60 MG 24 hr capsule         Review of Systems Review of Systems All other review of systems negative or noncontributory except as stated in the HPI  There were no vitals taken for this visit.  Physical Exam Physical Exam Physical Exam  Wt Readings from Last 3 Encounters:  01/10/12 264 lb 12.8 oz (120.112 kg)  12/27/10 266 lb (120.657 kg)  09/14/10 265 lb (120.203 kg)   Temp Readings from Last 3 Encounters:  01/10/12 97.1 F (36.2 C)    BP Readings from Last 3 Encounters:  01/10/12 141/77  12/27/10 143/87  09/14/10 130/80    Pulse Readings from Last 3 Encounters:  01/10/12 73  12/27/10 78  09/14/10 76    Nursing note and vitals reviewed. Constitutional: She is oriented to person, place, and time. She appears well-developed and well-nourished. No distress.  HENT:  Head: Normocephalic and atraumatic.  Mouth/Throat: No oropharyngeal exudate.  Eyes: Conjunctivae and EOM are normal. Pupils are equal, round, and reactive to light. Right eye exhibits no discharge. Left eye exhibits no discharge. No scleral icterus.  Neck: Normal range of motion. Neck supple. No tracheal deviation present.  Cardiovascular: Normal rate, regular rhythm, normal heart sounds and intact distal pulses.   Pulmonary/Chest: Effort normal and breath sounds normal. No stridor. No respiratory distress. She has no wheezes.  Abdominal: Soft. Bowel sounds are normal. She exhibits no distension and no mass. There is no tenderness. There is no rebound and no guarding.  Musculoskeletal: Normal range of motion. She exhibits no edema and no tenderness.  Neurological: She is alert and oriented to person, place, and time.  Skin: Skin is warm and dry. No rash noted. She is not diaphoretic. No erythema. No pallor.  Psychiatric: She has a normal mood and affect. Her behavior is normal. Judgment and thought content normal.  Breast: her left breast is normal exam without any suspicious skin changes, dominant masses, or lymphadenopathy. Her right breast did not have any suspicious masses, skin changes or lymphadenopathy as well her core needle biopsy site is visualized and in the area of concern there was no masses palpated. Data Reviewed Ultrasound, mammogram, MRI  Assessment    Right breast cancer She appears to have a very small, and favorable right breast cancer. No this is invasive on her core needle biopsy he seems to be a small tumor and she has ample breast size to be able to perform wide excision. I discussed with her the options for breast  conservation therapy including lumpectomy and sentinel lymph node biopsy followed by radiation therapy versus mastectomy and sentinel lymph node biopsy and she is leaning towards breast conservation therapy. Again, this is a small tumor on imaging and I think that we should be able to adequately perform wide local excision and sentinel lymph node biopsy for further evaluation. We discussed the pros and  cons and risks and benefits of each procedure including the risks of infection, bleeding, pain, scarring, poor cosmesis, recurrence, need for repeat surgeries, nerve injury, lymphedema, and she expressed understanding and desires to proceed with right needle localized lumpectomy with sentinel lymph node biopsy. She says that she had some issues with desaturations postoperatively so we will plan on doing this at one of the larger hospitals in case she needs overnight admission    Plan    We will plan for right breast needle localized lumpectomy with sentinel lymph node biopsy as soon as available. She has argument with her radiation oncologist and medical oncologist as well to discuss other treatment options.       Lodema Pilot DAVID 01/10/2012, 4:27 PM

## 2012-01-16 ENCOUNTER — Telehealth: Payer: Self-pay | Admitting: *Deleted

## 2012-01-16 NOTE — Telephone Encounter (Signed)
Left message for pt to return call concerning [facility] from 3/13.

## 2012-01-17 ENCOUNTER — Telehealth: Payer: Self-pay | Admitting: *Deleted

## 2012-01-17 ENCOUNTER — Encounter (HOSPITAL_COMMUNITY): Payer: Self-pay | Admitting: Pharmacy Technician

## 2012-01-17 NOTE — Telephone Encounter (Signed)
Spoke with pt concerning [facility] from 01/10/12.  Pt denies questions or concerns regarding dx or treatment care plan.  Encourage pt to call with needs.  Received verbal understanding.  Contact information given.

## 2012-01-18 ENCOUNTER — Encounter (HOSPITAL_COMMUNITY): Payer: Self-pay

## 2012-01-18 ENCOUNTER — Encounter (HOSPITAL_COMMUNITY)
Admission: RE | Admit: 2012-01-18 | Discharge: 2012-01-18 | Disposition: A | Discharge status: Home / Self Care | Payer: BC Managed Care – PPO | Source: Ambulatory Visit | Attending: Anesthesiology | Admitting: Anesthesiology

## 2012-01-18 ENCOUNTER — Other Ambulatory Visit: Payer: Self-pay

## 2012-01-18 ENCOUNTER — Encounter (HOSPITAL_COMMUNITY)
Admission: RE | Admit: 2012-01-18 | Discharge: 2012-01-18 | Disposition: A | Discharge status: Home / Self Care | Payer: BC Managed Care – PPO | Source: Ambulatory Visit | Attending: General Surgery | Admitting: General Surgery

## 2012-01-18 VITALS — BP 161/77 | HR 76 | Temp 97.0°F | Resp 20 | Ht 68.0 in | Wt 262.2 lb

## 2012-01-18 DIAGNOSIS — C50911 Malignant neoplasm of unspecified site of right female breast: Secondary | ICD-10-CM

## 2012-01-18 HISTORY — DX: Nontoxic goiter, unspecified: E04.9

## 2012-01-18 HISTORY — DX: Essential (primary) hypertension: I10

## 2012-01-18 HISTORY — DX: Thyrotoxicosis, unspecified without thyrotoxic crisis or storm: E05.90

## 2012-01-18 HISTORY — DX: Other complications of anesthesia, initial encounter: T88.59XA

## 2012-01-18 HISTORY — DX: Headache: R51

## 2012-01-18 HISTORY — DX: Myoneural disorder, unspecified: G70.9

## 2012-01-18 HISTORY — DX: Inflammatory liver disease, unspecified: K75.9

## 2012-01-18 HISTORY — DX: Angina pectoris, unspecified: I20.9

## 2012-01-18 HISTORY — DX: Depression, unspecified: F32.A

## 2012-01-18 HISTORY — DX: Anxiety disorder, unspecified: F41.9

## 2012-01-18 HISTORY — DX: Shortness of breath: R06.02

## 2012-01-18 HISTORY — DX: Gastro-esophageal reflux disease without esophagitis: K21.9

## 2012-01-18 HISTORY — DX: Major depressive disorder, single episode, unspecified: F32.9

## 2012-01-18 HISTORY — DX: Adverse effect of unspecified anesthetic, initial encounter: T41.45XA

## 2012-01-18 LAB — PROTIME-INR
INR: 1.09 (ref 0.00–1.49)
Prothrombin Time: 14.3 seconds (ref 11.6–15.2)

## 2012-01-18 LAB — CBC
Hemoglobin: 12.5 g/dL (ref 12.0–15.0)
MCH: 29.4 pg (ref 26.0–34.0)
RBC: 4.25 MIL/uL (ref 3.87–5.11)
WBC: 6.9 10*3/uL (ref 4.0–10.5)

## 2012-01-18 LAB — SURGICAL PCR SCREEN
MRSA, PCR: NEGATIVE
Staphylococcus aureus: NEGATIVE

## 2012-01-18 LAB — COMPREHENSIVE METABOLIC PANEL
AST: 63 U/L — ABNORMAL HIGH (ref 0–37)
BUN: 9 mg/dL (ref 6–23)
CO2: 28 mEq/L (ref 19–32)
Calcium: 9.6 mg/dL (ref 8.4–10.5)
Creatinine, Ser: 0.56 mg/dL (ref 0.50–1.10)
GFR calc non Af Amer: >90 mL/min (ref >90)

## 2012-01-18 LAB — APTT: aPTT: 32 seconds (ref 24–37)

## 2012-01-18 MED ORDER — CEFAZOLIN SODIUM-DEXTROSE 2-3 GM-% IV SOLR
2.0000 g | INTRAVENOUS | Status: AC
  Administered 2012-01-19: 2 g via INTRAVENOUS
  Filled 2012-01-18: qty 50

## 2012-01-18 NOTE — Progress Notes (Signed)
SPOKE WITH [facility] RE ANESTHESIA CONSULT, PATIENT STATED SHE WAS ADMITTED LAST YEAR AT [facility]/P HERNIA REPAIR FOR O2SAT DROPPING.  RECORDS HAVE BEEN REQUESTED.  ALSO REQUESTED STRESS TEST, HOLTER MONITOR, EKG, OV FROM CARDIOLOGIST DR ALI Rudolpho Sevin  [phone].

## 2012-01-18 NOTE — Pre-Procedure Instructions (Signed)
20 SONOMA FIRKUS  01/18/2012   Your procedure is scheduled on:    Friday  01/19/12  Report to [facility] at 1130 AM.  Call this number if you have problems the morning of surgery: [phone]   Remember:   Do not eat food:After Midnight.  May have clear liquids: up to 4 Hours before arrival.  Clear liquids include soda, tea, black coffee, apple or grape juice, broth.  Take these medicines the morning of surgery with A SIP OF WATER: WELLBUTRIN, ZYRTEC, CYMBALTA, NEURONTIN, ATIVAN , PRILOSEC, INDERAL (PROPANOLOL), MIRAPEX     Do not wear jewelry, make-up or nail polish.  Do not wear lotions, powders, or perfumes. You may wear deodorant.  Do not shave 48 hours prior to surgery.  Do not bring valuables to the hospital.  Contacts, dentures or bridgework may not be worn into surgery.  Leave suitcase in the car. After surgery it may be brought to your room.  For patients admitted to the hospital, checkout time is 11:00 AM the day of discharge.   Patients discharged the day of surgery will not be allowed to drive home.  Name and phone number of your driver:   Special Instructions: CHG Shower Use Special Wash: 1/2 bottle night before surgery and 1/2 bottle morning of surgery.   Please read over the following fact sheets that you were given: Pain Booklet, MRSA Information and Surgical Site Infection Prevention

## 2012-01-18 NOTE — Consult Note (Signed)
Anesthesia:  Patient is a 61 year old female scheduled for a right breast lumpectomy and SN biopsy on 01/19/12.  Her PAT appointment is today, 01/18/12.  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  She reported having to stay overnight after an redo umbilical hernia repair last year at [facility] due to hypoxemia.  Of note, her husband had a heart transplant at [facility] approximately 19 months ago.  She also reports that her father was Chief of Anesthesia at [facility] many years ago.  Her Cardiologist is Dr. Rudolpho Sevin.  Her Gastroenterologist is Dr. Marcelene Butte [phone]).  Endocrinologist is Dr. Talmage Nap.  Her PCP is currently Dr. Holley Bouche, but she is scheduled to get newly established with Dr. Montez Morita in [facility].    She last saw Dr. Rudolpho Sevin on 01/03/12.  She has known intermittent sharp chest and back pains that come and go without relation to activity.  Pains tend to occur more during times of emotional stress and seem to be better if she takes her Ativan.  She has discussed these with Dr. Rudolpho Sevin who does not think that these pains are cardiac in nature (see his note).  She had a nuclear stress test on 10/17/10 at [facility] that showed no evidence of ischemia, small fixed anteroapical defect c/w breast attenuation, normal LV function with EF 70%.  She was told her EKG showed a left BBB prior to a colonoscopy in 2011 and was referred to Cardiology at that time; however her last EKG from [facility] and here do not demonstrate this.  In  regards to her post-op hypoxemia following her incisional hernia repair in March of 2012, she is unsure of details.  She was not diagnosed with PNA or CHF and did not have to go home on supplemental O2.  There was not a dictated discharge summary available, and progress notes don't really give any details.  On her post-Anesthesia note her sats were documented at 93% on 3L/.  Today's CXR was WNL.  Sats 97%.  Lung sounds were clear.  She does have mild chronic DOE with bending down and moderate activity.  Her labs are noted.  Her AST and ALT are mildly elevated at 63 and 44, which are stable since 01/10/12.  She has mild thrombocytopenia with a PLT count of 142.  Coags are WNL.  Her glucose is 264.  (She reports her glucose levels have been running around 200 lately, but none in the 300 range.)  She will get a CBG on arrival and be treated as appropriate.  An abdominal U/S on 03/24/10 showed: 1. Multiple gallstones. No pain over the gallbladder.  2. Changes of fatty infiltration of the liver. Possible changes  of cirrhosis. No ductal dilatation.  3. Splenomegaly.  4. The tail of the pancreas is obscured.  5. Slightly prominent lymph node just above the pancreas.   Anticipate she can proceed if no significant change in her status.

## 2012-01-19 ENCOUNTER — Ambulatory Visit (HOSPITAL_BASED_OUTPATIENT_CLINIC_OR_DEPARTMENT_OTHER)
Admission: RE | Admit: 2012-01-19 | Discharge: 2012-01-19 | Disposition: A | Discharge status: Home / Self Care | Payer: BC Managed Care – PPO | Source: Ambulatory Visit | Attending: General Surgery | Admitting: General Surgery

## 2012-01-19 ENCOUNTER — Encounter (HOSPITAL_BASED_OUTPATIENT_CLINIC_OR_DEPARTMENT_OTHER): Payer: Self-pay | Admitting: *Deleted

## 2012-01-19 ENCOUNTER — Ambulatory Visit (HOSPITAL_COMMUNITY)
Admission: RE | Admit: 2012-01-19 | Discharge: 2012-01-19 | Disposition: A | Discharge status: Home / Self Care | Payer: BC Managed Care – PPO | Source: Ambulatory Visit | Attending: General Surgery | Admitting: General Surgery

## 2012-01-19 ENCOUNTER — Ambulatory Visit (HOSPITAL_COMMUNITY): Payer: BC Managed Care – PPO | Admitting: Vascular Surgery

## 2012-01-19 ENCOUNTER — Encounter (HOSPITAL_COMMUNITY): Payer: Self-pay | Admitting: Vascular Surgery

## 2012-01-19 ENCOUNTER — Encounter (HOSPITAL_BASED_OUTPATIENT_CLINIC_OR_DEPARTMENT_OTHER)
Admission: RE | Disposition: A | Discharge status: Home / Self Care | Payer: Self-pay | Source: Ambulatory Visit | Attending: General Surgery

## 2012-01-19 DIAGNOSIS — C50911 Malignant neoplasm of unspecified site of right female breast: Secondary | ICD-10-CM

## 2012-01-19 DIAGNOSIS — C50919 Malignant neoplasm of unspecified site of unspecified female breast: Secondary | ICD-10-CM

## 2012-01-19 DIAGNOSIS — Z853 Personal history of malignant neoplasm of breast: Secondary | ICD-10-CM | POA: Insufficient documentation

## 2012-01-19 DIAGNOSIS — Z01812 Encounter for preprocedural laboratory examination: Secondary | ICD-10-CM | POA: Insufficient documentation

## 2012-01-19 LAB — GLUCOSE, CAPILLARY: Glucose-Capillary: 180 mg/dL — ABNORMAL HIGH (ref 70–99)

## 2012-01-19 SURGERY — BREAST LUMPECTOMY WITH NEEDLE LOCALIZATION AND AXILLARY SENTINEL LYMPH NODE BX
Anesthesia: General | Site: Breast | Laterality: Right | Wound class: Clean

## 2012-01-19 MED ORDER — SODIUM CHLORIDE 0.9 % IJ SOLN
INTRAMUSCULAR | Status: DC | PRN
  Administered 2012-01-19: 14:00:00 via INTRAMUSCULAR

## 2012-01-19 MED ORDER — PROMETHAZINE HCL 25 MG/ML IJ SOLN: 6.2500 mg | INTRAMUSCULAR | Status: DC | PRN

## 2012-01-19 MED ORDER — EPHEDRINE SULFATE 50 MG/ML IJ SOLN
INTRAMUSCULAR | Status: DC | PRN
  Administered 2012-01-19 (×2): 5 mg via INTRAVENOUS
  Administered 2012-01-19: 10 mg via INTRAVENOUS
  Administered 2012-01-19: 5 mg via INTRAVENOUS

## 2012-01-19 MED ORDER — MIDAZOLAM HCL 2 MG/2ML IJ SOLN
0.5000 mg | INTRAMUSCULAR | Status: DC | PRN
  Administered 2012-01-19: 1 mg via INTRAVENOUS

## 2012-01-19 MED ORDER — LIDOCAINE-EPINEPHRINE (PF) 1 %-1:200000 IJ SOLN
INTRAMUSCULAR | Status: DC | PRN
  Administered 2012-01-19: 16:00:00 via SUBCUTANEOUS

## 2012-01-19 MED ORDER — ACETAMINOPHEN 10 MG/ML IV SOLN
1000.0000 mg | Freq: Once | INTRAVENOUS | Status: AC
  Administered 2012-01-19: 1000 mg via INTRAVENOUS

## 2012-01-19 MED ORDER — ONDANSETRON HCL 4 MG/2ML IJ SOLN
INTRAMUSCULAR | Status: DC | PRN
  Administered 2012-01-19: 4 mg via INTRAVENOUS

## 2012-01-19 MED ORDER — FENTANYL CITRATE 0.05 MG/ML IJ SOLN
INTRAMUSCULAR | Status: DC | PRN
  Administered 2012-01-19 (×2): 25 ug via INTRAVENOUS

## 2012-01-19 MED ORDER — LIDOCAINE HCL (CARDIAC) 20 MG/ML IV SOLN
INTRAVENOUS | Status: DC | PRN
  Administered 2012-01-19: 60 mg via INTRAVENOUS

## 2012-01-19 MED ORDER — TECHNETIUM TC 99M SULFUR COLLOID FILTERED
1.0000 | Freq: Once | INTRAVENOUS | Status: AC | PRN
  Administered 2012-01-19: 1 via INTRADERMAL

## 2012-01-19 MED ORDER — PROPOFOL 10 MG/ML IV EMUL
INTRAVENOUS | Status: DC | PRN
  Administered 2012-01-19: 200 mg via INTRAVENOUS

## 2012-01-19 MED ORDER — DEXAMETHASONE SODIUM PHOSPHATE 4 MG/ML IJ SOLN
INTRAMUSCULAR | Status: DC | PRN
  Administered 2012-01-19: 10 mg via INTRAVENOUS

## 2012-01-19 MED ORDER — SCOPOLAMINE 1 MG/3DAYS TD PT72
1.0000 | MEDICATED_PATCH | TRANSDERMAL | Status: DC
  Administered 2012-01-19: 1.5 mg via TRANSDERMAL

## 2012-01-19 MED ORDER — LACTATED RINGERS IV SOLN
INTRAVENOUS | Status: DC
  Administered 2012-01-19 (×2): via INTRAVENOUS

## 2012-01-19 MED ORDER — FENTANYL CITRATE 0.05 MG/ML IJ SOLN
50.0000 ug | INTRAMUSCULAR | Status: DC | PRN
  Administered 2012-01-19: 50 ug via INTRAVENOUS

## 2012-01-19 MED ORDER — HYDROCODONE-ACETAMINOPHEN 5-325 MG PO TABS: 1.0000 | ORAL_TABLET | Freq: Four times a day (QID) | ORAL | Status: AC | PRN

## 2012-01-19 MED ORDER — HYDROMORPHONE HCL PF 1 MG/ML IJ SOLN
0.2500 mg | INTRAMUSCULAR | Status: DC | PRN
  Administered 2012-01-19 (×3): 0.5 mg via INTRAVENOUS

## 2012-01-19 SURGICAL SUPPLY — 50 items
APPLIER CLIP 9.375 MED OPEN (MISCELLANEOUS) ×4
BINDER BREAST LRG (GAUZE/BANDAGES/DRESSINGS) IMPLANT
BINDER BREAST MEDIUM (GAUZE/BANDAGES/DRESSINGS) IMPLANT
BINDER BREAST XLRG (GAUZE/BANDAGES/DRESSINGS) IMPLANT
BINDER BREAST XXLRG (GAUZE/BANDAGES/DRESSINGS) ×2 IMPLANT
BLADE HEX COATED 2.75 (ELECTRODE) ×2 IMPLANT
BLADE SURG 15 STRL LF DISP TIS (BLADE) ×1 IMPLANT
BLADE SURG 15 STRL SS (BLADE) ×1
BNDG COHESIVE 4X5 TAN STRL (GAUZE/BANDAGES/DRESSINGS) ×2 IMPLANT
CANISTER SUCTION 1200CC (MISCELLANEOUS) ×2 IMPLANT
CHLORAPREP W/TINT 26ML (MISCELLANEOUS) ×2 IMPLANT
CLIP APPLIE 9.375 MED OPEN (MISCELLANEOUS) ×2 IMPLANT
CLOTH BEACON ORANGE TIMEOUT ST (SAFETY) ×2 IMPLANT
COVER MAYO STAND STRL (DRAPES) ×4 IMPLANT
COVER PROBE W GEL 5X96 (DRAPES) ×2 IMPLANT
COVER TABLE BACK 60X90 (DRAPES) ×2 IMPLANT
DECANTER SPIKE VIAL GLASS SM (MISCELLANEOUS) IMPLANT
DERMABOND ADVANCED (GAUZE/BANDAGES/DRESSINGS) ×1
DERMABOND ADVANCED .7 DNX12 (GAUZE/BANDAGES/DRESSINGS) ×1 IMPLANT
DEVICE DUBIN W/COMP PLATE 8390 (MISCELLANEOUS) ×2 IMPLANT
DRAIN PENROSE 1/2X12 LTX STRL (WOUND CARE) IMPLANT
DRAPE LAPAROSCOPIC ABDOMINAL (DRAPES) ×2 IMPLANT
DRAPE UTILITY XL STRL (DRAPES) ×4 IMPLANT
ELECT COATED BLADE 2.86 ST (ELECTRODE) ×2 IMPLANT
ELECT REM PT RETURN 9FT ADLT (ELECTROSURGICAL) ×2
ELECTRODE REM PT RTRN 9FT ADLT (ELECTROSURGICAL) ×1 IMPLANT
GAUZE SPONGE 4X4 12PLY STRL LF (GAUZE/BANDAGES/DRESSINGS) ×2 IMPLANT
GLOVE ECLIPSE 6.5 STRL STRAW (GLOVE) ×2 IMPLANT
GLOVE SURG SS PI 7.5 STRL IVOR (GLOVE) ×4 IMPLANT
GOWN PREVENTION PLUS XLARGE (GOWN DISPOSABLE) ×2 IMPLANT
GOWN PREVENTION PLUS XXLARGE (GOWN DISPOSABLE) ×2 IMPLANT
IV CATH 18G X1.75 CATHLON (IV SOLUTION) ×2 IMPLANT
KIT MARKER MARGIN INK (KITS) IMPLANT
NEEDLE HYPO 25X1 1.5 SAFETY (NEEDLE) ×4 IMPLANT
NS IRRIG 1000ML POUR BTL (IV SOLUTION) ×2 IMPLANT
PACK BASIN DAY SURGERY FS (CUSTOM PROCEDURE TRAY) ×2 IMPLANT
PENCIL BUTTON HOLSTER BLD 10FT (ELECTRODE) ×2 IMPLANT
SLEEVE SCD COMPRESS KNEE MED (MISCELLANEOUS) ×2 IMPLANT
SPONGE LAP 4X18 X RAY DECT (DISPOSABLE) ×2 IMPLANT
STAPLER VISISTAT 35W (STAPLE) IMPLANT
STOCKINETTE IMPERVIOUS LG (DRAPES) ×2 IMPLANT
SUT MNCRL AB 4-0 PS2 18 (SUTURE) ×6 IMPLANT
SUT SILK 2 0 SH (SUTURE) ×2 IMPLANT
SUT VICRYL 3-0 CR8 SH (SUTURE) ×2 IMPLANT
SYR CONTROL 10ML LL (SYRINGE) ×4 IMPLANT
TOWEL OR 17X24 6PK STRL BLUE (TOWEL DISPOSABLE) ×2 IMPLANT
TOWEL OR NON WOVEN STRL DISP B (DISPOSABLE) ×2 IMPLANT
TUBE CONNECTING 20X1/4 (TUBING) ×2 IMPLANT
WATER STERILE IRR 1000ML POUR (IV SOLUTION) IMPLANT
YANKAUER SUCT BULB TIP NO VENT (SUCTIONS) ×2 IMPLANT

## 2012-01-19 NOTE — Anesthesia Procedure Notes (Signed)
Procedure Name: LMA Insertion Date/Time: 01/19/2012 2:10 PM Performed by: Meyer Russel Pre-anesthesia Checklist: Patient identified, Emergency Drugs available, Suction available, Patient being monitored and Timeout performed Patient Re-evaluated:Patient Re-evaluated prior to inductionOxygen Delivery Method: Circle System Utilized Preoxygenation: Pre-oxygenation with 100% oxygen Intubation Type: IV induction Ventilation: Mask ventilation without difficulty LMA: LMA inserted LMA Size: 4.0 Number of attempts: 1 Airway Equipment and Method: bite block Placement Confirmation: positive ETCO2 and breath sounds checked- equal and bilateral Tube secured with: Tape Dental Injury: Teeth and Oropharynx as per pre-operative assessment

## 2012-01-19 NOTE — Transfer of Care (Signed)
Immediate Anesthesia Transfer of Care Note  Patient: Daisy Larson  Procedure(s) Performed: Procedure(s) (LRB): BREAST LUMPECTOMY WITH NEEDLE LOCALIZATION AND AXILLARY SENTINEL LYMPH NODE BX (Right)  Patient Location: PACU  Anesthesia Type: General  Level of Consciousness: sedated and responds to stimulation  Airway & Oxygen Therapy: Patient Spontanous Breathing and Patient connected to face mask oxygen  Post-op Assessment: Report given to PACU RN and Post -op Vital signs reviewed and stable  Post vital signs: Reviewed and stable  Complications: No apparent anesthesia complications

## 2012-01-19 NOTE — Interval H&P Note (Signed)
History and Physical Interval Note:  01/19/2012 1:53 PM  Daisy Larson  has presented today for surgery, with the diagnosis of to remove cancer from right breast  The various methods of treatment have been discussed with the patient and family. After consideration of risks, benefits and other options for treatment, the patient has consented to  Procedure(s) (LRB): BREAST LUMPECTOMY WITH NEEDLE LOCALIZATION AND AXILLARY SENTINEL LYMPH NODE BX (Right) as a surgical intervention .  The patients' history has been reviewed, patient examined, no change in status, stable for surgery.  I have reviewed the patients' chart and labs.  Questions were answered to the patient's satisfaction.  Site marked, needle placed, radioactive tracer in.  Risks of infection, bleeding, pain, scarring, recurrence, nerve injury, need for reexcision, poor cosmesis, tattoo again discussed in lay terms and she desires to proceed with right lumpectomy/SLN   Lodema Pilot DAVID

## 2012-01-19 NOTE — Anesthesia Postprocedure Evaluation (Signed)
  Anesthesia Post-op Note  Patient: Daisy Larson  Procedure(s) Performed: Procedure(s) (LRB): BREAST LUMPECTOMY WITH NEEDLE LOCALIZATION AND AXILLARY SENTINEL LYMPH NODE BX (Right)  Patient Location: PACU  Anesthesia Type: General  Level of Consciousness: awake  Airway and Oxygen Therapy: Patient Spontanous Breathing and Patient connected to face mask oxygen  Post-op Pain: moderate  Post-op Assessment: Post-op Vital signs reviewed, Patient's Cardiovascular Status Stable, Respiratory Function Stable, Patent Airway and No signs of Nausea or vomiting  Post-op Vital Signs: Reviewed and stable  Complications: No apparent anesthesia complications

## 2012-01-19 NOTE — Discharge Instructions (Signed)
[facility]  [address] [place], [place] [address] [phone]   Post Anesthesia Home Care Instructions  Activity: Get plenty of rest for the remainder of the day. A responsible adult should stay with you for 24 hours following the procedure.  For the next 24 hours, DO NOT: -Drive a car -Operate machinery -Drink alcoholic beverages -Take any medication unless instructed by your physician -Make any legal decisions or sign important papers.  Meals: Start with liquid foods such as gelatin or soup. Progress to regular foods as tolerated. Avoid greasy, spicy, heavy foods. If nausea and/or vomiting occur, drink only clear liquids until the nausea and/or vomiting subsides. Call your physician if vomiting continues.  Special Instructions/Symptoms: Your throat may feel dry or sore from the anesthesia or the breathing tube placed in your throat during surgery. If this causes discomfort, gargle with warm salt water. The discomfort should disappear within 24 hours.   

## 2012-01-19 NOTE — Brief Op Note (Signed)
01/19/2012  5:01 PM  PATIENT:  Daisy Larson  61 y.o. female  PRE-OPERATIVE DIAGNOSIS:  to remove cancer from right breast  POST-OPERATIVE DIAGNOSIS:  to remove cancer from right breast  PROCEDURE:  Procedure(s) (LRB): BREAST LUMPECTOMY WITH NEEDLE LOCALIZATION AND AXILLARY SENTINEL LYMPH NODE BX (Right)  SURGEON:  Surgeon(s) and Role:    * Lodema Pilot, DO - Primary  PHYSICIAN ASSISTANT:   ASSISTANTS: none   ANESTHESIA:   general  EBL:  Total I/O In: 2300 [I.V.:2300] Out: -   BLOOD ADMINISTERED:none  DRAINS: none   LOCAL MEDICATIONS USED:  MARCAINE    and LIDOCAINE   SPECIMEN:  Source of Specimen:  sentinel lymph node 1, 2, 3, 4, right lumpectomy ss/LL  DISPOSITION OF SPECIMEN:  PATHOLOGY  COUNTS:  YES  TOURNIQUET:  * No tourniquets in log *  DICTATION: .Other Dictation: Dictation Number   PLAN OF CARE: Discharge to home after PACU  PATIENT DISPOSITION:  PACU - hemodynamically stable.   Delay start of Pharmacological VTE agent (>24hrs) due to surgical blood loss or risk of bleeding: no

## 2012-01-19 NOTE — Anesthesia Preprocedure Evaluation (Addendum)
Anesthesia Evaluation  Patient identified by MRN, date of birth, ID band Patient awake    Reviewed: Allergy & Precautions, H&P , NPO status , Patient's Chart, lab work & pertinent test results, reviewed documented beta blocker date and time   Airway Mallampati: III TM Distance: >3 FB Neck ROM: Full    Dental No notable dental hx. (+) Teeth Intact   Pulmonary shortness of breath,    Pulmonary exam normal       Cardiovascular hypertension, On Medications and On Home Beta Blockers + dysrhythmias     Neuro/Psych  Headaches, PSYCHIATRIC DISORDERS  Neuromuscular disease    GI/Hepatic Neg liver ROS, GERD-  Controlled,  Endo/Other  Diabetes mellitus-, Poorly Controlled, Type 2, Oral Hypoglycemic AgentsHyperthyroidism Morbid obesity  Renal/GU negative Renal ROS  negative genitourinary   Musculoskeletal   Abdominal   Peds  Hematology negative hematology ROS (+)   Anesthesia Other Findings   Reproductive/Obstetrics negative OB ROS                           Anesthesia Physical Anesthesia Plan  ASA: III  Anesthesia Plan: General   Post-op Pain Management:    Induction: Intravenous  Airway Management Planned: LMA  Additional Equipment:   Intra-op Plan:   Post-operative Plan: Extubation in OR  Informed Consent: I have reviewed the patients History and Physical, chart, labs and discussed the procedure including the risks, benefits and alternatives for the proposed anesthesia with the patient or authorized representative who has indicated his/her understanding and acceptance.   Dental Advisory Given  Plan Discussed with: CRNA  Anesthesia Plan Comments:        Anesthesia Quick Evaluation

## 2012-01-19 NOTE — Op Note (Signed)
NAMENOELIE, RENFROW            ACCOUNT NO.:  [id number]  MEDICAL RECORD NO.:  [id number]  LOCATION:  PERIO                        FACILITY:  [facility]  PHYSICIAN:  Lodema Pilot, MD       DATE OF BIRTH:  Jun 09, 1951  DATE OF PROCEDURE:  01/19/2012 DATE OF DISCHARGE:                              OPERATIVE REPORT   PROCEDURE:  Right needle localized lumpectomy with sentinel lymph node biopsy.  PREOPERATIVE DIAGNOSIS:  Right breast cancer.  POSTOPERATIVE DIAGNOSIS:  Right breast cancer.  SURGEON:  Lodema Pilot, MD  ASSISTANT:  None.  ANESTHESIA:  General LMA anesthesia with 40 mL of 1% lidocaine with epinephrine and 0.25% Marcaine with 50:50 mixture.  FLUIDS:  2100 mL of crystalloid.  ESTIMATED BLOOD LOSS:  Minimal.  DRAINS:  None.  SPECIMENS: 1. Sentinel lymph node #1. 2. Sentinel lymph node #2. 3. Sentinel lymph node #3. 4. Sentinel lymph node #4. 5. Right breast lumpectomy with a short-stitch marking superior margin     and a long-stitch marking lateral margin.  COMPLICATIONS:  None apparent.  FINDINGS:  Sentinel lymph node #1, which was level 1 lymph node, hot but not blue, with a gamma count of 2359.  Sentinel lymph node #2, is level 1 lymph node hot, but not blue with a gamma count of 1829.  Specimen #3 was not  hot, not blue, and specimen #4 is not hot, not blue.  The right breast lumpectomy with the lateral margin essentially being the dermis, no additional tissue could be taken from the lateral aspect, otherwise short-stitch marked superior margin, long-stitch marked bilateral margin, and confirmation of Dr. Margy Clarks, that the clip and tumor were obtained.  INDICATION FOR PROCEDURE:  Ms. Laine is a 61 year old female with a recently diagnosed right breast cancer and the need for surgical management and she desires breast conservation therapy.  OPERATIVE DETAILS:  Ms. Self was seen and evaluated in the preoperative area and risks and benefits of  procedure were again discussed in lay terms.  Informed consent was obtained.  She already had needle localization and injection within the radioisotope and surgical site was marked and prophylactic antibiotics were given.  She was taken to the operating room, placed on table in supine position and general endotracheal anesthesia was obtained.  Then I injected 5 mL of methylene blue in 4 quadrants around the retroareolar area and 5 minutes vigorous breast massage was performed.  Then using the Neoprobe to identify the area of greatest activity and made a transverse incision inferior to the hair-bearing area of the axilla and dissection carried down through the subcutaneous tissue using Bovie electrocautery.  Clavipectoral fascia was entered, then the axillary fat was entered.  The lymph nodes were very deep and difficult to obtain and had to extend the incision in order to visualize in the depths of the cavity. Using the Neoprobe, I localized the area of greatest activity and identified a lymph node and elevated this with a tonsil clamp and dissected this circumferentially with Bovie electrocautery, and hemoclips on any lymphovascular structures.  The lymph node was removed which was a level 1 lymph node, was hot with a gamma count of 2359, but was not blue in fact,  I did not see any blue lymphatics or nodes in the axilla.  This was sent to pathology as sentinel lymph node #1.  I replaced the probe in the axilla and there was still some activity and I identified some palpable lymph nodes, and these were dissected in similar fashion using multiple hemoclips and Bovie electrocautery, and 2 additional lymph nodes were pulled out, labeled sentinel lymph node #3 and #4 which were not hot and not blue.  There was still some activity in the axilla and I finally identified the area of lymph node demonstrating the radioactive activity.  This was elevated and dissected similarly, removed as sentinel  lymph node #2 with a gamma count of 1829. Probe was placed in the axilla and the basin count was 3.  There was no other palpable lymph nodes or blue lymph nodes, and there was no other radioactive activity in the axilla.  The wound was inspected for hemostasis which was obtained with Bovie electrocautery and then irrigated with sterile saline solution.  Clavipectoral fascia was closed with a running 3-0 Vicryl suture and the skin edges were approximated with 4-0 Monocryl subcuticular suture.  Then the area of the of the needle localization in the right lateral breast were identified and the circumareolar incision was made in the area of the guidewire and circumferential breast flaps were elevated and the lateral flap was basically a mastectomy-type flap only approximately 7 mm thick.  It is a 1-1 cm thick, so if this lateral margin is positive, there is not much additional tissue that could be retrieved with re-excision.  I carried this otherwise flaps circumferentially and then down towards the chest wall, and after the core of tissue around the needle was obtained, I delivered the specimen out of the wound and undermined the lesion with Bovie electrocautery.  A short-stitch was placed on the superior margin, a long-stitch placed on the lateral margin and the specimen was x-rayed and then Dr. Margy Clarks, called to confirm that the specimen was in clips were indeed in the center portion of the specimen and he was satisfied with the specimen.  This was sent to Pathology as well for permanent section and clips were placed in the 12 o'clock, 3 o'clock, 6 o'clock, and 9 o'clock position of the cavity and one on the chest wall, and the dermis was approximated with interrupted 3-0 Vicryl sutures and prior to securing the final 2 sutures, cavities was infused with 40 mL of 1% lidocaine with epinephrine and 0.25% Marcaine in a 50:50 mixture using an angiocatheter.  Then a 4-0 Monocryl subcuticular  suture was used to close the skin edges and the skin was washed and dried, and Dermabond was applied.  All sponge, needle, and instrument counts were correct at the end of the case.  The patient tolerated procedure well without apparent complication.          ______________________________ Lodema Pilot, MD     BL/MEDQ  D:  01/19/2012  T:  01/19/2012  Job:  [id number]

## 2012-01-19 NOTE — H&P (View-Only) (Signed)
Patient ID: Daisy Larson, female   DOB: 03/27/1951, 60 y.o.   MRN: [id number]  No chief complaint on file.   HPI Daisy Larson is a 60 y.o. female.  HPI this patient is seen in the multidisciplinary breast clinic today for evaluation of a newly diagnosed right breast cancer found on annual mammogram.  she has a history of a density seen previously on mammogram approximately one year ago and this was followed up 6 months later with imaging and it was thought to have no change from the prior images. She recently underwent her annual mammograms and noticed some increased density in the area of concern and this was followed up with ultrasound which does not demonstrate any abnormality. She had an MRI which demonstrated a 1.3 cm area of enhancement in the area of known tumor. 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 She says that she does her self breast exam and has not noticed any changes on physical exam. She also denies any systemic symptoms such as headaches, bony pains, or weight loss. She does say that she has been on Premarin and with her last surgery she also had to stay overnight for observation due to desaturations. She is followed by Dr. Akbary in the [place] as her cardiologist.  Past Medical History  Diagnosis Date  . Bundle branch block   . Cirrhosis   . Multiple thyroid nodules   . History of stomach ulcers   . Hernia   . Colon polyp   . Neuropathy of foot   . Restless legs   Diabetes  Past Surgical History  Procedure Date  . Appendectomy   . Tonsillectomy   . Cesarean section   . Tubal ligation   . Abdominal hysterectomy   . Back surgery   . Hernia repair   . Liver biopsy     Family History  Problem Relation Age of Onset  . Cancer Maternal Grandfather   . Cancer Paternal Grandfather     Social History History  Substance Use Topics  . Smoking status: Never Smoker   .  Smokeless tobacco: Not on file  . Alcohol Use: Yes    Allergies  Allergen Reactions  . Tape     Current Outpatient Prescriptions  Medication Sig Dispense Refill  . aspirin 81 MG tablet Take 81 mg by mouth daily.      . B Complex-C (SUPER B COMPLEX PO) Take 1 tablet by mouth daily.      . buPROPion (WELLBUTRIN XL) 300 MG 24 hr tablet       . Calcium Carbonate (CALCIUM 500 PO) Take 1 tablet by mouth daily.      . cetirizine (ZYRTEC) 10 MG tablet Take 10 mg by mouth daily.      . Cholecalciferol (VITAMIN D3) 3000 UNITS TABS Take 1,000 mg by mouth daily.      . DULoxetine (CYMBALTA) 60 MG capsule Take 60 mg by mouth daily.      . gabapentin (NEURONTIN) 800 MG tablet       . glimepiride (AMARYL) 4 MG tablet       . Liraglutide (VICTOZA [place]) Inject 1.8 mg into the skin daily.      . lisinopril (PRINIVIL,ZESTRIL) 20 MG tablet       . LORazepam (ATIVAN) 2 MG tablet Take 2 mg by mouth every 6 (six) hours as needed.      . meclizine (ANTIVERT) 25 MG   tablet Take 12.5 mg by mouth 3 (three) times daily as needed.      . metFORMIN (GLUCOPHAGE-XR) 500 MG 24 hr tablet       . METHOCARBAMOL PO Take 500 mg by mouth daily as needed.      . MULTIPLE VITAMIN PO Take 1 tablet by mouth daily.      . NON FORMULARY Take 1 tablet by mouth daily. CINSULIN      . pramipexole (MIRAPEX) 0.5 MG tablet       . pravastatin (PRAVACHOL) 40 MG tablet       . propranolol (INDERAL LA) 60 MG 24 hr capsule         Review of Systems Review of Systems All other review of systems negative or noncontributory except as stated in the HPI  There were no vitals taken for this visit.  Physical Exam Physical Exam Physical Exam  Wt Readings from Last 3 Encounters:  01/10/12 264 lb 12.8 oz (120.112 kg)  12/27/10 266 lb (120.657 kg)  09/14/10 265 lb (120.203 kg)   Temp Readings from Last 3 Encounters:  01/10/12 97.1 F (36.2 C)    BP Readings from Last 3 Encounters:  01/10/12 141/77  12/27/10 143/87  09/14/10 130/80    Pulse Readings from Last 3 Encounters:  01/10/12 73  12/27/10 78  09/14/10 76    Nursing note and vitals reviewed. Constitutional: She is oriented to person, place, and time. She appears well-developed and well-nourished. No distress.  HENT:  Head: Normocephalic and atraumatic.  Mouth/Throat: No oropharyngeal exudate.  Eyes: Conjunctivae and EOM are normal. Pupils are equal, round, and reactive to light. Right eye exhibits no discharge. Left eye exhibits no discharge. No scleral icterus.  Neck: Normal range of motion. Neck supple. No tracheal deviation present.  Cardiovascular: Normal rate, regular rhythm, normal heart sounds and intact distal pulses.   Pulmonary/Chest: Effort normal and breath sounds normal. No stridor. No respiratory distress. She has no wheezes.  Abdominal: Soft. Bowel sounds are normal. She exhibits no distension and no mass. There is no tenderness. There is no rebound and no guarding.  Musculoskeletal: Normal range of motion. She exhibits no edema and no tenderness.  Neurological: She is alert and oriented to person, place, and time.  Skin: Skin is warm and dry. No rash noted. She is not diaphoretic. No erythema. No pallor.  Psychiatric: She has a normal mood and affect. Her behavior is normal. Judgment and thought content normal.  Breast: her left breast is normal exam without any suspicious skin changes, dominant masses, or lymphadenopathy. Her right breast did not have any suspicious masses, skin changes or lymphadenopathy as well her core needle biopsy site is visualized and in the area of concern there was no masses palpated. Data Reviewed Ultrasound, mammogram, MRI  Assessment    Right breast cancer She appears to have a very small, and favorable right breast cancer. No this is invasive on her core needle biopsy he seems to be a small tumor and she has ample breast size to be able to perform wide excision. I discussed with her the options for breast  conservation therapy including lumpectomy and sentinel lymph node biopsy followed by radiation therapy versus mastectomy and sentinel lymph node biopsy and she is leaning towards breast conservation therapy. Again, this is a small tumor on imaging and I think that we should be able to adequately perform wide local excision and sentinel lymph node biopsy for further evaluation. We discussed the pros and   cons and risks and benefits of each procedure including the risks of infection, bleeding, pain, scarring, poor cosmesis, recurrence, need for repeat surgeries, nerve injury, lymphedema, and she expressed understanding and desires to proceed with right needle localized lumpectomy with sentinel lymph node biopsy. She says that she had some issues with desaturations postoperatively so we will plan on doing this at one of the larger hospitals in case she needs overnight admission    Plan    We will plan for right breast needle localized lumpectomy with sentinel lymph node biopsy as soon as available. She has argument with her radiation oncologist and medical oncologist as well to discuss other treatment options.       Zymir Napoli DAVID 01/10/2012, 4:27 PM    

## 2012-01-22 ENCOUNTER — Encounter: Payer: Self-pay | Admitting: *Deleted

## 2012-01-22 ENCOUNTER — Other Ambulatory Visit (HOSPITAL_COMMUNITY): Payer: BC Managed Care – PPO

## 2012-01-22 ENCOUNTER — Telehealth (INDEPENDENT_AMBULATORY_CARE_PROVIDER_SITE_OTHER): Payer: Self-pay | Admitting: General Surgery

## 2012-01-22 NOTE — Progress Notes (Signed)
Mailed after appt letter to pt. 

## 2012-01-22 NOTE — Telephone Encounter (Signed)
Patient called she needs an appt for Thursday 01/25/12, per discharge instructions, please call.

## 2012-01-22 NOTE — Telephone Encounter (Signed)
Daughter of Daisy Larson calling for post op appt.  Dr. Biagio Quint stated he needed to see her on Thursday (before he left town?)  She had surgery on 01/19/12.  Okay to call daughter or the patient herself with appt information.

## 2012-01-22 NOTE — Telephone Encounter (Signed)
Called with PO appt for 01/25/12 @ 8:30 am per Dr. Biagio Quint

## 2012-01-25 ENCOUNTER — Ambulatory Visit (INDEPENDENT_AMBULATORY_CARE_PROVIDER_SITE_OTHER): Payer: BC Managed Care – PPO | Admitting: General Surgery

## 2012-01-25 VITALS — BP 138/86 | HR 68 | Temp 97.2°F | Resp 16 | Ht 68.0 in | Wt 259.6 lb

## 2012-01-25 DIAGNOSIS — Z5189 Encounter for other specified aftercare: Secondary | ICD-10-CM

## 2012-01-25 DIAGNOSIS — Z4889 Encounter for other specified surgical aftercare: Secondary | ICD-10-CM

## 2012-01-25 NOTE — Progress Notes (Signed)
Subjective:     Patient ID: Daisy Larson, female   DOB: 07-09-1951, 61 y.o.   MRN: [id number]  HPI Patient follows up in weeks status post right breast lumpectomy with sentinel lymph node biopsy. She has done very well and is recovering without any discomfort. 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 Further report is somewhat confusing with the margins as one section of the pathology reports a 2 mm posterior margin and another section reports a 5 mm posterior margin. Review of Systems     Objective:   Physical Exam No acute distress and nontoxic-appearing Her incisions are healing well without sign of infection and overall good cosmesis.    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 There is some confusion with regard to the posterior margin of whether this is 2 mm or 5 mm. Synthesis the posterior margin it would be difficult to obtain much more tissue from this area although I think because this was so far lateral as we may be able to obtain an additional posterior margin if required. We will certainly need clarification from pathology as to whether this is truly a 5 mm margin or 2 mm margin. I think if it remains at 5 mm, with the other favorable characteristics of the tumor, I would really not recommend any reexcision. She is going to meet with Dr. Basilio Cairo next week to discuss radiation therapies and her recommendations regards to reexcision and radiation as well and we will chat again after this visit.     Plan:     Follow up after her radiation oncology visit.

## 2012-01-26 ENCOUNTER — Encounter: Payer: Self-pay | Admitting: *Deleted

## 2012-01-26 ENCOUNTER — Other Ambulatory Visit (HOSPITAL_COMMUNITY): Payer: BC Managed Care – PPO

## 2012-01-29 ENCOUNTER — Encounter: Payer: Self-pay | Admitting: Radiation Oncology

## 2012-01-29 ENCOUNTER — Ambulatory Visit
Admission: RE | Admit: 2012-01-29 | Discharge: 2012-01-29 | Disposition: A | Discharge status: Home / Self Care | Payer: BC Managed Care – PPO | Source: Ambulatory Visit | Attending: Radiation Oncology | Admitting: Radiation Oncology

## 2012-01-29 VITALS — BP 149/90 | HR 80 | Temp 97.5°F | Wt 261.3 lb

## 2012-01-29 DIAGNOSIS — C50419 Malignant neoplasm of upper-outer quadrant of unspecified female breast: Secondary | ICD-10-CM

## 2012-01-29 DIAGNOSIS — Z510 Encounter for antineoplastic radiation therapy: Secondary | ICD-10-CM | POA: Insufficient documentation

## 2012-01-29 DIAGNOSIS — Z7982 Long term (current) use of aspirin: Secondary | ICD-10-CM | POA: Insufficient documentation

## 2012-01-29 DIAGNOSIS — C50919 Malignant neoplasm of unspecified site of unspecified female breast: Secondary | ICD-10-CM | POA: Insufficient documentation

## 2012-01-29 DIAGNOSIS — L989 Disorder of the skin and subcutaneous tissue, unspecified: Secondary | ICD-10-CM | POA: Insufficient documentation

## 2012-01-29 DIAGNOSIS — Z79899 Other long term (current) drug therapy: Secondary | ICD-10-CM | POA: Insufficient documentation

## 2012-01-29 DIAGNOSIS — R51 Headache: Secondary | ICD-10-CM | POA: Insufficient documentation

## 2012-01-29 DIAGNOSIS — R079 Chest pain, unspecified: Secondary | ICD-10-CM | POA: Insufficient documentation

## 2012-01-29 NOTE — Progress Notes (Signed)
[facility] Radiation Oncology Review Of Systems  Name: Daisy Larson MRN: [id number]  Date: 01/29/2012  DOB: February 12, 1951  Status:outpatient   Drug Allergies:  Allergies  Allergen Reactions  . Tape     Symptoms since last visit at this office:  CONSTITUTIONAL: Loss of Sleep and Fatigue  EYES:Glasses  EARS/NOSE/THROAT: Sinus Problems  HEART: Bundle Branch Block  LUNG: None  STOMACH/BOWEL: Constipation and Hx of Hemorrhoids  GENITOURINARY: None  BREASTS: Invasive ductala of Right Breast  SKIN: Bruise Easily  MUSCLE/BONES: Back Pain Occasionally- fusion L4-5.    NERVOUS SYSTEM:Vertigo, Neuropathy in feet  MENTAL HEALTH:Anxiety, Depression and Suicidal Thoughts  HORMONES/REPRODUCTIVE: Diabetes, Thyroid Problem, Hot Flashes, Menarche15*, LMP age 61 due to Hysterectomy, Age First Pregnancy age 61,, Pregnant now  noNO and # of Pregnancies =- G2, P2, C-Section x 2  BLOOD/LYMPH SYSTEM: Anemia in past prior to 2nd pregancy  IMMUNE: None  ADDITIONAL INFORMATION/CONCERNS: None

## 2012-01-29 NOTE — Progress Notes (Signed)
[facility] Radiation Oncology Follow up Note  Name: Daisy Larson   Date: 01/29/2012    MRN: [id number] DOB: October 19, 1951  CC:  Luvenia Redden, MD, MD  Lodema Pilot, DO  DIAGNOSIS: Pathologic T1 C. 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 She had her surgery on 01/19/2012. Lumpectomy and sentinel lymph node biopsy was performed. All 4 sentinel nodes were negative. Invasive mucinous  carcinoma was appreciated in the lumpectomy specimen. The tumor was 1.4 cm. There was no LVSI. The final resection margins were clear. There is some ambiguity in the pathology report but it appears that the closest margin was no less than 2 mm. Possibly it was as wide as 5 mm and we'll try to clarify this at our tumor board conference. There was no in situ disease    ALLERGIES: Tape   MEDICATIONS:  Current Outpatient Prescriptions  Medication Sig Dispense Refill  . aspirin 81 MG tablet Take 81 mg by mouth daily.      . B Complex-C (SUPER B COMPLEX PO) Take 1 tablet by mouth daily.      Marland Kitchen buPROPion (WELLBUTRIN XL) 300 MG 24 hr tablet Take 300 mg by mouth daily.       . Calcium Carbonate (CALCIUM 500 PO) Take 1 tablet by mouth daily.      . cetirizine (ZYRTEC) 10 MG tablet Take 10 mg by mouth daily.      . Cholecalciferol 1000 UNITS capsule Take 2,000 Units by mouth daily.      Marland Kitchen CINNAMON PO Take 2 tablets by mouth daily.      . DULoxetine (CYMBALTA) 60 MG capsule Take 60 mg by mouth daily.      Marland Kitchen gabapentin (NEURONTIN) 800 MG tablet 800 mg 3 (three) times daily.       Marland Kitchen glimepiride (AMARYL) 4 MG tablet Take 4 mg by mouth 2 (two) times daily with a meal.       . Liraglutide (VICTOZA [place]) Inject 1.8 mg into the skin daily.      Marland Kitchen lisinopril (PRINIVIL,ZESTRIL) 20 MG tablet Take 20 mg by mouth daily. Take in the morning      . LORazepam (ATIVAN) 2 MG tablet Take 2 mg by mouth every 6 (six) hours as needed. anxiety       . meclizine (ANTIVERT) 25 MG tablet Take 12.5 mg by mouth 3 (three) times daily as needed. vertigo      . metFORMIN (GLUCOPHAGE-XR) 500 MG 24 hr tablet Take 500 mg by mouth 2 (two) times daily.       . methocarbamol (ROBAXIN) 500 MG tablet Take 500 mg by mouth daily as needed. Muscle spasms      . MULTIPLE VITAMIN PO Take 1 tablet by mouth daily.      Marland Kitchen omeprazole (PRILOSEC) 40 MG capsule Take 40 mg by mouth daily.      . pramipexole (MIRAPEX) 0.5 MG tablet Take 0.5 mg by mouth daily.       . pravastatin (PRAVACHOL) 40 MG tablet Take 40 mg by mouth daily.       . propranolol (INDERAL LA) 60 MG 24 hr capsule Take 60 mg by mouth daily. Take at night      . HYDROcodone-acetaminophen (NORCO) 5-325 MG per tablet Take 1 tablet by mouth every 6 (six) hours as needed for pain.  40 tablet  0     PHYSICAL EXAM:  Her blood pressure is 149/90. Pulse 80. Temperature 97.5 degrees. Weight 261lb Her right breast is notable for a lumpectomy scar and axillary scar. There is some postoperative swelling and tenderness.   LABORATORY DATA:  Lab Results  Component Value Date   WBC 6.9 01/18/2012   HGB 12.5 01/18/2012   HCT 38.5 01/18/2012   MCV 90.6 01/18/2012   PLT 142* 01/18/2012   Lab Results  Component Value Date   NA 137 01/18/2012   K 4.0 01/18/2012   CL 98 01/18/2012   CO2 28 01/18/2012   Lab Results  Component Value Date   ALT 44* 01/18/2012   AST 63* 01/18/2012   ALKPHOS 143* 01/18/2012   BILITOT 0.4 01/18/2012       Pathology as above  IMPRESSION/PLAN: Doing well postoperatively. We will discuss her at our tumor board conference, I expect. I told the patient that even if her margin is somewhat close with 2 mm, if I provide a boost treatment this would further reduce her risk of local recurrence. Her prognosis is excellent with or without a boost treatment. She does not need reexcision in my opinion. I told the patient to expect approximately 6 weeks of radiotherapy. We will simulate her in  about 2 weeks. We may treat her in the prone position if she can tolerate this to decrease her risk of skin toxicity.  Medical oncology does not feel that chemotherapy will be necessary for her, but eventually she will start hormonal therapy.  It was a pleasure meeting again with the patient today. We discussed the risks, benefits, and side effects of radiotherapy. No guarantees of treatment were given. A consent form was signed and placed in the patient's medical record. The patient is enthusiastic about proceeding with treatment. I look forward to participating in the patient's care.

## 2012-02-09 ENCOUNTER — Ambulatory Visit
Admission: RE | Admit: 2012-02-09 | Discharge: 2012-02-09 | Disposition: A | Discharge status: Home / Self Care | Payer: BC Managed Care – PPO | Source: Ambulatory Visit | Attending: Radiation Oncology | Admitting: Radiation Oncology

## 2012-02-09 DIAGNOSIS — C50919 Malignant neoplasm of unspecified site of unspecified female breast: Secondary | ICD-10-CM

## 2012-02-09 DIAGNOSIS — C50419 Malignant neoplasm of upper-outer quadrant of unspecified female breast: Secondary | ICD-10-CM

## 2012-02-09 NOTE — Progress Notes (Signed)
Simulation treatment planning note  Diagnosis pathologic T1 CN 0M0 right breast cancer   The patient is status post lumpectomy. She will receive whole breast radiotherapy. Adhesive wiring was placed over the patient's lumpectomy scar. The patient was laid in the prone position on the treatment table with her arms over her head. I verified that her right breast was hanging away from her chest wall appropriately. High-resolution CT axial imaging was obtained of the patient's chest. An isocenter was placed in her anterior right breast. Skin markings were made and she tolerated the procedure well without any complications.  Treatment planning note: the patient will be treated with opposed tangential fields using MLCs for custom blocks. I plan to prescribe 50 Gray in 25 fractions to the whole right breast followed by boost to the lumpectomy cavity of 10 Gray in 5 fractions.

## 2012-02-09 NOTE — Progress Notes (Signed)
Met with patient and spouse, Daisy Larson, to discuss RO billing.  Gave preliminary financial assessment - spouse only receives SS income and patient is receiving income from an annuity.  Will return at next visit 02/16/12. 03/05/12 - mark note complete- patient has not brought information back in.

## 2012-02-16 ENCOUNTER — Encounter: Payer: Self-pay | Admitting: Radiation Oncology

## 2012-02-16 ENCOUNTER — Ambulatory Visit
Admission: RE | Admit: 2012-02-16 | Discharge: 2012-02-16 | Disposition: A | Discharge status: Home / Self Care | Payer: BC Managed Care – PPO | Source: Ambulatory Visit | Attending: Radiation Oncology | Admitting: Radiation Oncology

## 2012-02-16 NOTE — Progress Notes (Signed)
VERIFICATION SIMULATION  NARRATIVE: The patient was laid in the correct position on the treatment table for simulation verification. Portal imaging was obtained and I verified the fields and MLCs for her right prone breast treatment to be accurate. The patient tolerated the procedure well.

## 2012-02-19 ENCOUNTER — Ambulatory Visit
Admission: RE | Admit: 2012-02-19 | Discharge: 2012-02-19 | Disposition: A | Discharge status: Home / Self Care | Payer: BC Managed Care – PPO | Source: Ambulatory Visit | Attending: Radiation Oncology | Admitting: Radiation Oncology

## 2012-02-20 ENCOUNTER — Ambulatory Visit
Admission: RE | Admit: 2012-02-20 | Discharge: 2012-02-20 | Disposition: A | Discharge status: Home / Self Care | Payer: BC Managed Care – PPO | Source: Ambulatory Visit | Attending: Radiation Oncology | Admitting: Radiation Oncology

## 2012-02-20 VITALS — BP 179/80 | HR 80 | Temp 98.1°F

## 2012-02-20 DIAGNOSIS — C50419 Malignant neoplasm of upper-outer quadrant of unspecified female breast: Secondary | ICD-10-CM

## 2012-02-20 MED ORDER — RADIAPLEXRX EX GEL
Freq: Once | CUTANEOUS | Status: AC
  Administered 2012-02-20: 1 via TOPICAL

## 2012-02-20 MED ORDER — ALRA NON-METALLIC DEODORANT (RAD-ONC)
1.0000 | Freq: Once | TOPICAL | Status: AC
Start: 2012-02-20 — End: 2012-02-20
  Administered 2012-02-20: 1 via TOPICAL

## 2012-02-20 NOTE — Progress Notes (Addendum)
Daisy Larson reports that since her 1st Radiation Therapy treatment on yesterday that her breast is swollen, red and warm to touch.  On inspection there is very noticeable redness/swelling in right breast with mild warmth noted.  Daisy Larson c/o tenderness  But no overt pain.  Post simulation Education performed and Given Radiation Therapy and You Booklet.  Review Skin Care, fatigue and pain management.  Reviewed Daily check-in process.  Informed that she is seen every Monday by Dr. Basilio Cairo, but can be seen anytime she has a concern.  Also informed if Dr. Basilio Cairo out of clinic on Monday, she will assign another physician to see her and she will be informed in advance.  She stated understanding.

## 2012-02-20 NOTE — Progress Notes (Signed)
   Department of Radiation Oncology  Phone:  [phone] Fax:        [phone]   Weekly Management Note Current Dose: 4.0 Gy  Projected Dose: 60Gy   Narrative:  The patient presents for under treatment assessment.   Port film x-rays were reviewed.  The chart was checked. Patient asked to be seen today after noticing diffuse erythema in the right breast after her first treatment. She denies any chills or fever.  Physical Findings: The right breast does show some increased erythema centrally. In addition the left breast does show some erythema. Part of this may be related to the patient's large pendulous breasts and edema related to this issue.  Impression:  The patient is tolerating radiation well thus far.  Plan:  Continue treatment as planned. I doubt the patient has cellulitis but the patient will be examined again tomorrow by Dr. Basilio Cairo who has examined the patient in the past.   Billie Lade, M.D.

## 2012-02-21 ENCOUNTER — Ambulatory Visit
Admission: RE | Admit: 2012-02-21 | Discharge: 2012-02-21 | Disposition: A | Discharge status: Home / Self Care | Payer: BC Managed Care – PPO | Source: Ambulatory Visit | Attending: Radiation Oncology | Admitting: Radiation Oncology

## 2012-02-21 ENCOUNTER — Encounter: Payer: Self-pay | Admitting: Radiation Oncology

## 2012-02-21 VITALS — BP 157/76 | HR 68 | Temp 98.0°F | Resp 20

## 2012-02-21 DIAGNOSIS — C50419 Malignant neoplasm of upper-outer quadrant of unspecified female breast: Secondary | ICD-10-CM

## 2012-02-21 NOTE — Progress Notes (Signed)
Encounter addended by: Billie Lade, MD on: 02/21/2012  4:56 PM<BR>     Documentation filed: Visit Diagnoses, Notes Section

## 2012-02-21 NOTE — Progress Notes (Signed)
Pt came to nursing, states: My right breast isn't as red,but still warm and swollen and nipple inverted", erythema on right breastm,patient also stated "my temp usually is 96.7, " will have Dr.Kinard look at patient again ,since he saw her yesterday, Sonda Rumble ,RN,also saw patients skin yesterday as well, patient informed Dr. Basilio Cairo out of office and will se her Friday after treatment, she isn't using radiaplex gel as yet  2:14 PM

## 2012-02-21 NOTE — Progress Notes (Signed)
   Department of Radiation Oncology  Phone:  [phone] Fax:        [phone]   Weekly Management Note Current Dose: 6.0  Gy  Projected Dose:60.0  Gy   Narrative:  The patient presents for  under treatment assessment.  Daisy Larson is seen again today after her third treatment. The patient did notice more erythema after her third treatment yesterday afternoon. She denies any chills or fever or significant pain in the right breast.  .Physical Findings: . On examination the right breast shows some erythema but overall this seems stable if not better compared to yesterday's exam.  Impression:  The patient is tolerating radiation.   Plan:  Continue treatment as planned. Patient will be seen again on Friday, April 26 by Dr. Basilio Cairo for further evaluation.

## 2012-02-22 ENCOUNTER — Ambulatory Visit
Admission: RE | Admit: 2012-02-22 | Discharge: 2012-02-22 | Disposition: A | Discharge status: Home / Self Care | Payer: BC Managed Care – PPO | Source: Ambulatory Visit | Attending: Radiation Oncology | Admitting: Radiation Oncology

## 2012-02-23 ENCOUNTER — Ambulatory Visit
Admission: RE | Admit: 2012-02-23 | Discharge: 2012-02-23 | Disposition: A | Discharge status: Home / Self Care | Payer: BC Managed Care – PPO | Source: Ambulatory Visit | Attending: Radiation Oncology | Admitting: Radiation Oncology

## 2012-02-23 ENCOUNTER — Encounter: Payer: Self-pay | Admitting: Radiation Oncology

## 2012-02-23 DIAGNOSIS — C50419 Malignant neoplasm of upper-outer quadrant of unspecified female breast: Secondary | ICD-10-CM

## 2012-02-23 NOTE — Progress Notes (Signed)
   Weekly Management Note, stage I right breast cancer Current Dose:  1000 cGy  Projected Dose: 6000 cGy   Narrative:  The patient presents for routine under treatment assessment.  CBCT/MVCT images/Port film x-rays were reviewed.  The chart was checked. She is doing well. She noticed erythema and swelling of her right breast after her first fraction of radiotherapy. For the rest of the week, this has waxed and waned. Today it is less noticeable to her. She has not had any fevers. Dr. Roselind Messier has met with her twice this week.  Physical Findings:  She is in no acute distress. Her right breast is erythematous, her left breast is erythematous as well. The erythema however is more noticeable the right breast. There is no sign of infection. There is no significant swelling of either breast.  Impression:  The patient is tolerating radiotherapy. Erythema may be post- surgical, but could be related to radiotherapy. I reviewed the patient's medications to verify that there is no radiosensitization in that regard.  Plan:  Continue radiotherapy as planned.

## 2012-02-26 ENCOUNTER — Ambulatory Visit
Admission: RE | Admit: 2012-02-26 | Discharge: 2012-02-26 | Disposition: A | Discharge status: Home / Self Care | Payer: BC Managed Care – PPO | Source: Ambulatory Visit | Attending: Radiation Oncology | Admitting: Radiation Oncology

## 2012-02-26 ENCOUNTER — Encounter: Payer: Self-pay | Admitting: Radiation Oncology

## 2012-02-26 DIAGNOSIS — C50419 Malignant neoplasm of upper-outer quadrant of unspecified female breast: Secondary | ICD-10-CM

## 2012-02-26 NOTE — Progress Notes (Signed)
   Weekly Management Note : stage I right breast cancer Current Dose: 1200  cGy  Projected Dose:  6000 cGy   Narrative:  The patient presents for routine under treatment assessment.  CBCT/MVCT images/Port film x-rays were reviewed.  The chart was checked. She feels about the same as that her last treatment visit. Her breast is slightly warm on the right. It feels a little bit more swollen than the left side but not discernibly more swollen or heavy than last week.  Physical Findings: Weight:  . She is in no acute distress. Her right breast is erythematous, skin is intact. It is slightly larger than the left breast. There may be a modest seroma that I can palpate under the lumpectomy scar. No sign of obvious infection.  Impression:  The patient is tolerating radiotherapy.  Plan:  Continue radiotherapy as planned. We will continue to follow her.

## 2012-02-27 ENCOUNTER — Ambulatory Visit
Admission: RE | Admit: 2012-02-27 | Discharge: 2012-02-27 | Disposition: A | Discharge status: Home / Self Care | Payer: BC Managed Care – PPO | Source: Ambulatory Visit | Attending: Radiation Oncology | Admitting: Radiation Oncology

## 2012-02-28 ENCOUNTER — Ambulatory Visit
Admission: RE | Admit: 2012-02-28 | Discharge: 2012-02-28 | Disposition: A | Discharge status: Home / Self Care | Payer: BC Managed Care – PPO | Source: Ambulatory Visit | Attending: Radiation Oncology | Admitting: Radiation Oncology

## 2012-02-29 ENCOUNTER — Ambulatory Visit
Admission: RE | Admit: 2012-02-29 | Discharge: 2012-02-29 | Disposition: A | Discharge status: Home / Self Care | Payer: BC Managed Care – PPO | Source: Ambulatory Visit | Attending: Radiation Oncology | Admitting: Radiation Oncology

## 2012-03-01 ENCOUNTER — Ambulatory Visit
Admission: RE | Admit: 2012-03-01 | Discharge: 2012-03-01 | Disposition: A | Discharge status: Home / Self Care | Payer: BC Managed Care – PPO | Source: Ambulatory Visit | Attending: Radiation Oncology | Admitting: Radiation Oncology

## 2012-03-04 ENCOUNTER — Ambulatory Visit
Admission: RE | Admit: 2012-03-04 | Discharge: 2012-03-04 | Disposition: A | Discharge status: Home / Self Care | Payer: BC Managed Care – PPO | Source: Ambulatory Visit | Attending: Radiation Oncology | Admitting: Radiation Oncology

## 2012-03-04 ENCOUNTER — Encounter: Payer: Self-pay | Admitting: Radiation Oncology

## 2012-03-04 VITALS — BP 156/97 | HR 102 | Wt 263.9 lb

## 2012-03-04 DIAGNOSIS — C50419 Malignant neoplasm of upper-outer quadrant of unspecified female breast: Secondary | ICD-10-CM

## 2012-03-04 NOTE — Progress Notes (Signed)
Wants to be seen today prior to treatment for what she notes as changes to her skin on her arms.  Her arms are redder and more brown and she is worried abou this new presentation.  Educated her that her arms are not in her treatment field.  C/o nausea after treatments on last week.  C/o feeling "woozy" and has a "background" headache presently.  No evidence of orthostatic pressures.  To be seen by Dr. Basilio Cairo.

## 2012-03-04 NOTE — Progress Notes (Signed)
   Weekly Management Note: stage I right breast cancer Current Dose:   2200cGy  Projected Dose:  6000cGy   Narrative:  The patient presents for routine under treatment assessment.  CBCT/MVCT images/Port film x-rays were reviewed.  The chart was checked. She has a few issues, first of all she has noticed more dryness and redness over her arms than usual. Secondly, she has had a headache which is not typical of her usual migraines. It comes and goes and is mainly frontal. She reports that sometimes she feels woozy and has some problems walking straight. She also reports nausea that comes and goes. She is eating well. Hydrated.  Physical Findings: Weight: 263 lb 14.4 oz (119.704 kg). She is not orthostatic by pulse or blood pressure. The skin over her arms is not very impressive, just a little bit dry and slightly erythematous. Not necessarily consistent with an allergic reaction. Her right breast is swollen and erythematous but the skin is intact. Finger to nose testing and rapidly alternating movements are intact. She has no nystagmus.  Impression:  The patient is tolerating radiotherapy.  Plan:  Continue radiotherapy as planned. I'm not sure what is causing the mild skin irritation but I recommended that she avoid hot showers and use Lubriderm lotion. I assured her this is not from the radiotherapy. Next  week, I will assess her again. If She continues to have the same symptoms of headache and nausea and wooziness we can consider an MRI.  The patient is comfortable with this plan.

## 2012-03-05 ENCOUNTER — Ambulatory Visit
Admission: RE | Admit: 2012-03-05 | Discharge: 2012-03-05 | Disposition: A | Discharge status: Home / Self Care | Payer: BC Managed Care – PPO | Source: Ambulatory Visit | Attending: Radiation Oncology | Admitting: Radiation Oncology

## 2012-03-06 ENCOUNTER — Ambulatory Visit
Admission: RE | Admit: 2012-03-06 | Discharge: 2012-03-06 | Disposition: A | Discharge status: Home / Self Care | Payer: BC Managed Care – PPO | Source: Ambulatory Visit | Attending: Radiation Oncology | Admitting: Radiation Oncology

## 2012-03-07 ENCOUNTER — Ambulatory Visit
Admission: RE | Admit: 2012-03-07 | Discharge: 2012-03-07 | Disposition: A | Discharge status: Home / Self Care | Payer: BC Managed Care – PPO | Source: Ambulatory Visit | Attending: Radiation Oncology | Admitting: Radiation Oncology

## 2012-03-08 ENCOUNTER — Ambulatory Visit
Admission: RE | Admit: 2012-03-08 | Discharge: 2012-03-08 | Disposition: A | Discharge status: Home / Self Care | Payer: BC Managed Care – PPO | Source: Ambulatory Visit | Attending: Radiation Oncology | Admitting: Radiation Oncology

## 2012-03-11 ENCOUNTER — Ambulatory Visit: Payer: BC Managed Care – PPO | Admitting: Radiation Oncology

## 2012-03-11 ENCOUNTER — Encounter: Payer: Self-pay | Admitting: Radiation Oncology

## 2012-03-11 ENCOUNTER — Ambulatory Visit
Admission: RE | Admit: 2012-03-11 | Discharge: 2012-03-11 | Disposition: A | Discharge status: Home / Self Care | Payer: BC Managed Care – PPO | Source: Ambulatory Visit | Attending: Radiation Oncology | Admitting: Radiation Oncology

## 2012-03-11 VITALS — BP 132/77 | HR 71 | Temp 97.1°F | Wt 264.7 lb

## 2012-03-11 DIAGNOSIS — C50419 Malignant neoplasm of upper-outer quadrant of unspecified female breast: Secondary | ICD-10-CM

## 2012-03-11 NOTE — Progress Notes (Signed)
   Weekly Management Note: Right breast cancer Current Dose:   3200 cGy  Projected Dose:  6000 cGy   Narrative:  The patient presents for routine under treatment assessment.  CBCT/MVCT images/Port film x-rays were reviewed.  The chart was checked. She is doing well. Her 1 complaint is that she has some pain at the lower sternal region which radiated down her right arm on Friday night. She has a distant history of costochondritis. Her headaches and nausea have resolved. She sometimes still feels a bit woozy when she walks.  Physical Findings: Weight: 264 lb 11.2 oz (120.067 kg). She is in no acute distress. Her right breast is erythematous but the skin is intact  Impression:  The patient is tolerating radiotherapy.  Plan:  Continue radiotherapy as planned. I told her that if she has recurrent symptoms consistent with this sternal pain that radiated down her right arm she should talk to her primary doctor. I don't think that she is having costochondritis from her treatments as we are not delivering radiation to her rib cage: She is being treated in the prone position and her breast tissue is being pulled away from her chest wall, allowing [place] to avoid radiating her rib cage.

## 2012-03-11 NOTE — Progress Notes (Signed)
Here for weekly routine under treat visit with md for radiation treatment of right breast.right breas hyperpigmentation  With out peeling. Had episode of mid epigastric pain radiating down right arm but had resolved by Saturday morning.Vitals stable. Increased fatigue.

## 2012-03-12 ENCOUNTER — Encounter: Payer: Self-pay | Admitting: *Deleted

## 2012-03-12 ENCOUNTER — Ambulatory Visit
Admission: RE | Admit: 2012-03-12 | Discharge: 2012-03-12 | Disposition: A | Discharge status: Home / Self Care | Payer: BC Managed Care – PPO | Source: Ambulatory Visit | Attending: Radiation Oncology | Admitting: Radiation Oncology

## 2012-03-12 ENCOUNTER — Ambulatory Visit: Payer: BC Managed Care – PPO | Admitting: Radiation Oncology

## 2012-03-12 DIAGNOSIS — C50419 Malignant neoplasm of upper-outer quadrant of unspecified female breast: Secondary | ICD-10-CM

## 2012-03-12 MED ORDER — RADIAPLEXRX EX GEL
Freq: Once | CUTANEOUS | Status: AC
  Administered 2012-03-12: 08:00:00 via TOPICAL

## 2012-03-12 NOTE — Progress Notes (Signed)
Pt requested 2nd tube radiaplex gel,forgot to ask yesterday 8:14 AM

## 2012-03-13 ENCOUNTER — Ambulatory Visit
Admission: RE | Admit: 2012-03-13 | Discharge: 2012-03-13 | Disposition: A | Discharge status: Home / Self Care | Payer: BC Managed Care – PPO | Source: Ambulatory Visit | Attending: Radiation Oncology | Admitting: Radiation Oncology

## 2012-03-13 DIAGNOSIS — C50419 Malignant neoplasm of upper-outer quadrant of unspecified female breast: Secondary | ICD-10-CM

## 2012-03-13 NOTE — Progress Notes (Signed)
SIMULATION / Treatment planning NOTE FOR BOOST TREATMENT  The patient was laid in the supine position on the treatment table with her arms over her head. Her head was in an Accuform device. I placed adhesive wiring over her lumpectomy scar. High-resolution CT axial imaging was obtained of the patient's chest. An isocenter was placed in her lumpectomy cavity. Skin markings were made and she tolerated the procedure well without any complications.  Treatment planning note: the patient will be treated with an electron beam, using a custom electron cutout.  A special port plan has been requested from physics.  I will prescribe 10 Gy/ 5 fractions, to the lumpectomy cavity, with 2 cm margin to block edge.

## 2012-03-14 ENCOUNTER — Ambulatory Visit
Admission: RE | Admit: 2012-03-14 | Discharge: 2012-03-14 | Disposition: A | Discharge status: Home / Self Care | Payer: BC Managed Care – PPO | Source: Ambulatory Visit | Attending: Radiation Oncology | Admitting: Radiation Oncology

## 2012-03-15 ENCOUNTER — Ambulatory Visit
Admission: RE | Admit: 2012-03-15 | Discharge: 2012-03-15 | Disposition: A | Discharge status: Home / Self Care | Payer: BC Managed Care – PPO | Source: Ambulatory Visit | Attending: Radiation Oncology | Admitting: Radiation Oncology

## 2012-03-15 NOTE — Progress Notes (Signed)
Encounter addended by: Delynn Flavin, RN on: 03/15/2012  4:38 PM<BR>     Documentation filed: Charges VN

## 2012-03-18 ENCOUNTER — Encounter: Payer: Self-pay | Admitting: Radiation Oncology

## 2012-03-18 ENCOUNTER — Ambulatory Visit
Admission: RE | Admit: 2012-03-18 | Discharge: 2012-03-18 | Disposition: A | Discharge status: Home / Self Care | Payer: BC Managed Care – PPO | Source: Ambulatory Visit | Attending: Radiation Oncology | Admitting: Radiation Oncology

## 2012-03-18 VITALS — BP 138/78 | HR 69 | Temp 97.6°F | Resp 20 | Wt 262.7 lb

## 2012-03-18 DIAGNOSIS — C50919 Malignant neoplasm of unspecified site of unspecified female breast: Secondary | ICD-10-CM

## 2012-03-18 NOTE — Progress Notes (Signed)
Electron beam simulation note: The patient underwent virtual simulation for her right breast boost with electrons. One custom block is constructed to conform the field. Dr. Basilio Cairo is prescribing 1000 cGy in 5 sessions utilizing 18 MEV electrons. Electron beam energy was chosen based on the depth of her tumor bed as seen on her CT scan. A special port plan is requested.

## 2012-03-18 NOTE — Progress Notes (Signed)
Patient alert and oriented x3, erythema on right breast, 21/30tx completed, no c/o pain or discomfort 8:20 AM

## 2012-03-18 NOTE — Progress Notes (Signed)
Weekly Management Note:  Site:R Breast Current Dose:  4200  cGy Projected Dose: 6000  cGy  Narrative: The patient is seen today for routine under treatment assessment. CBCT/MVCT images/port films were reviewed. The chart was reviewed.   She is without complaints today. She uses Radioplex gel.  Physical Examination:  Filed Vitals:   03/18/12 0818  BP: 138/78  Pulse: 69  Temp: 97.6 F (36.4 C)  Resp: 20  .  Weight: 262 lb 11.2 oz (119.16 kg). There is erythema along the central breast, but no areas of desquamation.  Impression: Tolerating radiation therapy well.  Plan: Continue radiation therapy as planned.

## 2012-03-19 ENCOUNTER — Ambulatory Visit
Admission: RE | Admit: 2012-03-19 | Discharge: 2012-03-19 | Disposition: A | Discharge status: Home / Self Care | Payer: BC Managed Care – PPO | Source: Ambulatory Visit | Attending: Radiation Oncology | Admitting: Radiation Oncology

## 2012-03-20 ENCOUNTER — Ambulatory Visit
Admission: RE | Admit: 2012-03-20 | Discharge: 2012-03-20 | Disposition: A | Discharge status: Home / Self Care | Payer: BC Managed Care – PPO | Source: Ambulatory Visit | Attending: Radiation Oncology | Admitting: Radiation Oncology

## 2012-03-21 ENCOUNTER — Ambulatory Visit
Admission: RE | Admit: 2012-03-21 | Discharge: 2012-03-21 | Disposition: A | Discharge status: Home / Self Care | Payer: BC Managed Care – PPO | Source: Ambulatory Visit | Attending: Radiation Oncology | Admitting: Radiation Oncology

## 2012-03-22 ENCOUNTER — Ambulatory Visit
Admission: RE | Admit: 2012-03-22 | Discharge: 2012-03-22 | Disposition: A | Discharge status: Home / Self Care | Payer: BC Managed Care – PPO | Source: Ambulatory Visit | Attending: Radiation Oncology | Admitting: Radiation Oncology

## 2012-03-26 ENCOUNTER — Ambulatory Visit
Admission: RE | Admit: 2012-03-26 | Discharge: 2012-03-26 | Disposition: A | Discharge status: Home / Self Care | Payer: BC Managed Care – PPO | Source: Ambulatory Visit | Attending: Radiation Oncology | Admitting: Radiation Oncology

## 2012-03-27 ENCOUNTER — Ambulatory Visit
Admission: RE | Admit: 2012-03-27 | Discharge: 2012-03-27 | Disposition: A | Discharge status: Home / Self Care | Payer: BC Managed Care – PPO | Source: Ambulatory Visit | Attending: Radiation Oncology | Admitting: Radiation Oncology

## 2012-03-27 ENCOUNTER — Encounter: Payer: Self-pay | Admitting: Radiation Oncology

## 2012-03-27 VITALS — BP 146/79 | Wt 265.2 lb

## 2012-03-27 DIAGNOSIS — C50419 Malignant neoplasm of upper-outer quadrant of unspecified female breast: Secondary | ICD-10-CM

## 2012-03-27 DIAGNOSIS — C50919 Malignant neoplasm of unspecified site of unspecified female breast: Secondary | ICD-10-CM

## 2012-03-27 MED ORDER — BIAFINE EX EMUL
CUTANEOUS | Status: DC | PRN
  Administered 2012-03-27: 1 via TOPICAL

## 2012-03-27 NOTE — Progress Notes (Signed)
Encounter addended by: Tessa Lerner, RN on: 03/27/2012 10:26 AM<BR>     Documentation filed: Inpatient MAR

## 2012-03-27 NOTE — Progress Notes (Signed)
   Weekly Management Note, right breast cancer Current Dose:   5400 cGy  Projected Dose:  6000 cGy   Narrative:  The patient presents for routine under treatment assessment.  CBCT/MVCT images/Port film x-rays were reviewed.  The chart was checked. She is doing well. She reports itching over her breast.  Physical Findings: Weight: 265 lb 3.2 oz (120.294 kg). The right breast is diffusely erythematous. In particular there is marked hyperpigmentation in the inframammary fold. Skin is intact everywhere with exception to some dry desquamation over the right nipple.  Impression:  The patient is tolerating radiotherapy.  Plan:  Continue radiotherapy as planned. We will start Biafine for her throughout the breast as well as hydrocortisone cream in the areas that are itching. I gave the patient a card to schedule a followup in approximately one month after completion of treatment. I will see her on her last treatment day as well. She has an appointment for followup with medical oncology in June 3.

## 2012-03-27 NOTE — Progress Notes (Signed)
Here for routine weekly under treat visit for radiation of right breast. Nipple soreness and irritation. Hyperpigmentation of skin is very red.Will give biafine for application. Generalized fatigue.

## 2012-03-28 ENCOUNTER — Ambulatory Visit
Admission: RE | Admit: 2012-03-28 | Discharge: 2012-03-28 | Disposition: A | Discharge status: Home / Self Care | Payer: BC Managed Care – PPO | Source: Ambulatory Visit | Attending: Radiation Oncology | Admitting: Radiation Oncology

## 2012-03-28 NOTE — Progress Notes (Signed)
Encounter addended by: Delynn Flavin, RN on: 03/28/2012  1:34 PM<BR>     Documentation filed: Charges VN

## 2012-03-29 ENCOUNTER — Ambulatory Visit
Admission: RE | Admit: 2012-03-29 | Discharge: 2012-03-29 | Disposition: A | Discharge status: Home / Self Care | Payer: BC Managed Care – PPO | Source: Ambulatory Visit | Attending: Radiation Oncology | Admitting: Radiation Oncology

## 2012-04-01 ENCOUNTER — Ambulatory Visit: Payer: BC Managed Care – PPO

## 2012-04-01 ENCOUNTER — Ambulatory Visit
Admission: RE | Admit: 2012-04-01 | Discharge: 2012-04-01 | Disposition: A | Discharge status: Home / Self Care | Payer: BC Managed Care – PPO | Source: Ambulatory Visit | Attending: Radiation Oncology | Admitting: Radiation Oncology

## 2012-04-01 ENCOUNTER — Encounter: Payer: Self-pay | Admitting: Radiation Oncology

## 2012-04-01 ENCOUNTER — Telehealth: Payer: Self-pay | Admitting: *Deleted

## 2012-04-01 ENCOUNTER — Ambulatory Visit (HOSPITAL_BASED_OUTPATIENT_CLINIC_OR_DEPARTMENT_OTHER): Payer: BC Managed Care – PPO | Admitting: Oncology

## 2012-04-01 VITALS — BP 139/73 | HR 80 | Temp 98.6°F | Ht 68.0 in | Wt 266.7 lb

## 2012-04-01 DIAGNOSIS — L538 Other specified erythematous conditions: Secondary | ICD-10-CM

## 2012-04-01 DIAGNOSIS — R5383 Other fatigue: Secondary | ICD-10-CM

## 2012-04-01 DIAGNOSIS — C50419 Malignant neoplasm of upper-outer quadrant of unspecified female breast: Secondary | ICD-10-CM

## 2012-04-01 MED ORDER — ANASTROZOLE 1 MG PO TABS: 1.0000 mg | ORAL_TABLET | Freq: Every day | ORAL | Status: AC

## 2012-04-01 NOTE — Progress Notes (Signed)
ID: Daisy Larson   DOB: 09-11-1951  MR#: [id number]  CSN#:[id number]  HISTORY OF PRESENT ILLNESS: The patient had annual mammography 12/29/2011 at [facility], showing  an 11 mm focal nodular density in the upper outer quadrant of the right breast which had increased in size as compared to the prior study a year prior. Ultrasound showed no sonographic abnormality. 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 The patient underwent bilateral breast MRI 01/09/2012 showing a solitary enhancing mass in the lateral aspect of the right breast measuring 1.3 cm. There was no enlarged axillary or internal mammary adenopathy and no other abnormality was noted.  The patient was seen at the multidisciplinary breast cancer clinic 01/10/2012 with further treatments as detailed below  INTERVAL HISTORY: Since her last visit here Daisy Larson had her definitive right lumpectomy, and is completing her adjuvant radiation treatments today.  REVIEW OF SYSTEMS: She has done well with radiation, except for some dry desquamation and fatigue. She tends to fall sleep during the day and then can't sleep at night. She is having some pain in the area of surgery and also under the right breast. She finds that the actual for a cream helps better than the radio plaques. She has mild nausea, chronic low back pain where she had her laminectomy, but overall a detailed review of systems today was stable  PAST MEDICAL HISTORY: Past Medical History  Diagnosis Date  . Cirrhosis   . Multiple thyroid nodules   . History of stomach ulcers   . Hernia   . Colon polyp   . Neuropathy of foot   . Restless legs   . Complication of anesthesia     WITH SECON HERNIA REPAIR 2012 [facility]  DIFFICULTY O2 SAT DROPPING ADMIT TO HOSPITAL     . Bundle branch block   . Hypertension   . Angina     COSTOCONDRITIS    . Shortness of breath     WITH EXERTION   . Diabetes mellitus   . Hepatitis      Did Not have hepatitis but needed Hep A and Hep B injections2012 SPOTS ON LIVER  DR Marcelene [place]te  [phone]  . GERD (gastroesophageal reflux disease)     ULCERS  . Hyperthyroidism     NODULES ON THYROID  (DR BALEN 37  12-609)  . Goiter   . Headache     MIGRAINES  . Neuromuscular disorder     PERIPH NEUROPATHY   . Cancer     RIGHT BREAST  . Anxiety   . Depression   . Breast cancer 01/19/12     right breast lumpectomy/ER/PR positibe,her-2 neg.  . Allergy     tape  She underwent liver biopsy 03/28/2011, at [facility], showing evidence of early cirrhosis. There was mildly active chronic hepatitis (grade 1). She also has a history of thyroid nodules which have been stable on serial ultrasounds according to the patient  PAST SURGICAL HISTORY: Past Surgical History  Procedure Date  . Appendectomy   . Tonsillectomy   . Cesarean section   . Tubal ligation   . Abdominal hysterectomy   . Back surgery   . Liver biopsy   . Hernia repair     X 2  . Anal fissure repair   . Carpal tunnel release  LEFT   . Colonoscopy w/ polypectomy   . Breast surgery     BIOPSY  Right Breast -  Sentinel Lymph Nodes  . Back surgery   . Liver biopsy     FAMILY HISTORY Family History  Problem Relation Age of Onset  . Cancer Maternal Grandfather   . Cancer Paternal Grandfather    the patient's father died at the age of 52, following a fall. The patient's mother died at the age of 33. 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 The patient had no brothers, 3 sisters, one of whom is present at the initial visit. There is no history of breast or ovarian cancer in the family, but one niece was diagnosed with colon cancer at the age of 81. Her paternal grandfather died from colon cancer. Her maternal grandfather died from lung  cancer.  GYNECOLOGIC HISTORY: She had menarche at age 65. She went through menopause approximately age 35. She took hormone replacement on total recently. She is GX P2, first pregnancy to term age 59.  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 Her husband of 43 years, Daisy Larson, is a Education officer, environmental. He underwent heart transplant at [facility] at May 2011 and is doing very well except for pain from costochondritis. Their children are Daisy Larson who lives in [place] and is a Chief Financial Officer, and Daisy Larson,  also living in [place], who works as a Scientist, forensic. The patient has one grandchild. She attends an independent [employer]    ADVANCED DIRECTIVES: in place  HEALTH MAINTENANCE: History  Substance Use Topics  . Smoking status: Never Smoker   . Smokeless tobacco: Not on file  . Alcohol Use: No     Colonoscopy: 2011  PAP: 2012  Bone density: 2003  Lipid panel:  Allergies  Allergen Reactions  . Tape     Current Outpatient Prescriptions  Medication Sig Dispense Refill  . aspirin 81 MG tablet Take 81 mg by mouth daily.      . B Complex-C (SUPER B COMPLEX PO) Take 1 tablet by mouth daily.      Marland Kitchen buPROPion (WELLBUTRIN XL) 300 MG 24 hr tablet Take 300 mg by mouth daily.       . Calcium Carbonate (CALCIUM 500 PO) Take 1 tablet by mouth daily.      . cetirizine (ZYRTEC) 10 MG tablet Take 10 mg by mouth daily.      . Cholecalciferol 1000 UNITS capsule Take 2,000 Units by mouth daily.      Marland Kitchen CINNAMON PO Take 2 tablets by mouth daily.      . DULoxetine (CYMBALTA) 60 MG capsule Take 60 mg by mouth daily.      Marland Kitchen gabapentin (NEURONTIN) 800 MG tablet 800 mg 3 (three) times daily.       Marland Kitchen glimepiride (AMARYL) 4 MG tablet Take 4 mg by mouth 2 (two) times daily with a meal.       . hyaluronate sodium (RADIAPLEXRX) GEL Apply 1 application topically 2 (two) times daily. 2nd tube given      . insulin lispro  protamine-insulin lispro (HUMALOG 75/25) (75-25) 100 UNIT/ML SUSP Inject 35 Units into the skin 2 (two) times daily with a meal.      . lisinopril (PRINIVIL,ZESTRIL) 20 MG tablet Take 20 mg by mouth daily. Take in the morning      . LORazepam (ATIVAN) 2 MG tablet Take 2 mg by mouth every  6 (six) hours as needed. anxiety      . meclizine (ANTIVERT) 25 MG tablet Take 12.5 mg by mouth 3 (three) times daily as needed. vertigo      . metFORMIN (GLUCOPHAGE-XR) 500 MG 24 hr tablet Take 500 mg by mouth 2 (two) times daily.       . methocarbamol (ROBAXIN) 500 MG tablet Take 500 mg by mouth daily as needed. Muscle spasms      . MULTIPLE VITAMIN PO Take 1 tablet by mouth daily.      Marland Kitchen omeprazole (PRILOSEC) 40 MG capsule Take 40 mg by mouth daily.      . pramipexole (MIRAPEX) 0.5 MG tablet Take 1 mg by mouth daily.       . pravastatin (PRAVACHOL) 40 MG tablet Take 40 mg by mouth daily.       . propranolol (INDERAL LA) 60 MG 24 hr capsule Take 60 mg by mouth daily. Take at night      . zolpidem (AMBIEN) 10 MG tablet Take 10 mg by mouth at bedtime as needed.        OBJECTIVE: Middle-aged white woman in no acute distress Filed Vitals:   04/01/12 1013  BP: 139/73  Pulse: 80  Temp: 98.6 F (37 C)     Body mass index is 40.55 kg/(m^2).    ECOG FS: 1  Sclerae unicteric Oropharynx clear No peripheral adenopathy Lungs no rales or rhonchi Heart regular rate and rhythm Abd obese, benign MSK no focal spinal tenderness, no joint swelling or effusion Neuro: nonfocal Breasts: The right breast is status post lumpectomy and radiation. There is significant erythema, but currently no desquamation. In the inframammary fold and also in the superior aspect of the radiation port there is some additional irritation suggestive of a dermatophytosis. The left breast is unremarkable  LAB RESULTS: Lab Results  Component Value Date   WBC 6.9 01/18/2012   NEUTROABS 3.2 01/10/2012   HGB 12.5 01/18/2012   HCT 38.5 01/18/2012     MCV 90.6 01/18/2012   PLT 142* 01/18/2012      Chemistry      Component Value Date/Time   NA 137 01/18/2012 1003   K 4.0 01/18/2012 1003   CL 98 01/18/2012 1003   CO2 28 01/18/2012 1003   BUN 9 01/18/2012 1003   CREATININE 0.56 01/18/2012 1003      Component Value Date/Time   CALCIUM 9.6 01/18/2012 1003   ALKPHOS 143* 01/18/2012 1003   AST 63* 01/18/2012 1003   ALT 44* 01/18/2012 1003   BILITOT 0.4 01/18/2012 1003       Lab Results  Component Value Date   LABCA2 25 01/10/2012    No components found with this basename: [id number]    No results found for this basename: INR:1;PROTIME:1 in the last 168 hours  Urinalysis No results found for this basename: colorurine,  appearanceur,  labspec,  phurine,  glucoseu,  hgbur,  bilirubinur,  ketonesur,  proteinur,  urobilinogen,  nitrite,  leukocytesur    STUDIES: No new studies noted  ASSESSMENT: 61 year [place]   (1) status post right lumpectomy 01/19/2012 for a T1c N0, stage IA invasive mucinous carcinoma, grade 1, estrogen 99% and progesterone 100% receptor positive, with an MIB of 8, and no HER-2 amplification.  (2) completed radiation 04/01/2012  PLAN: She is now ready to start antiestrogen therapy. We're going to go with an anastrozole, which I think will interact least a with any of her other medications and medical  problems. I would discuss the possible toxicities side effects and complications as well as the benefits, and this information was given to her in writing. She is a member of [employer] and should be able to obtain this medication for less than $5 a month for her.  She has not had a bone density in a decade. She is going to have one at [facility] sometime in August. She will see me again in September. If she is tolerating the anastrozole well at that point we will start seeing her on a every 6 month basis. We'll also check a vitamin D level prior to the next visit here, 3 months from now.  I wrote her a  prescription for Diflucan to take for the next 5 days if she continues to have the itchy problem around the breast after that she will let me know.  Daisy Larson C    04/01/2012

## 2012-04-01 NOTE — Telephone Encounter (Signed)
gave patient appointment for 07-03-2012 arrival time 9:00am printed out calendar and gave to the patient mammogram and bone density at [facility] on 04-04-2012 at 10:00am printed out calendar and gave to the patient

## 2012-04-01 NOTE — Progress Notes (Signed)
Patient alert,oriented x3, excoriation bright erythema under mammary fold, skin intact, on breast, pain under axilla, raw completed treatment today, deferred weight, vitals wnl 8:57 AM

## 2012-04-01 NOTE — Progress Notes (Signed)
   Weekly Management Note right breast cancer Current Dose:   6000 cGy  Projected Dose:  6000 cGy   Narrative:  The patient presents for routine under treatment assessment.  CBCT/MVCT images/Port film x-rays were reviewed.  The chart was checked. She is doing well. She has more skin irritation over her breast and inframammary fold  Physical Findings:  vitals were not taken for this visit. vitals were deferred by the patient. skin is intact throughout the breast. She has diffuse breast erythema with increased erythema at the inframammary fold.  Impression:  The patient  has tolerated radiotherapy.  Plan:  Routine followup in one month. She'll followup with medical oncology today.

## 2012-04-02 ENCOUNTER — Ambulatory Visit: Payer: BC Managed Care – PPO

## 2012-04-03 ENCOUNTER — Ambulatory Visit: Payer: BC Managed Care – PPO

## 2012-04-04 ENCOUNTER — Ambulatory Visit: Payer: BC Managed Care – PPO

## 2012-04-04 ENCOUNTER — Telehealth: Payer: Self-pay | Admitting: *Deleted

## 2012-04-04 ENCOUNTER — Other Ambulatory Visit: Payer: Self-pay | Admitting: *Deleted

## 2012-04-04 NOTE — Telephone Encounter (Signed)
Pt called to report

## 2012-04-15 ENCOUNTER — Encounter: Payer: Self-pay | Admitting: Radiation Oncology

## 2012-04-15 NOTE — Progress Notes (Signed)
[facility] Radiation Oncology End of Treatment Note  Name:Daisy Larson  Date: 04/15/2012 BMW:[id number] DOB:1951/06/03   Status:outpatient    DIAGNOSIS: Pathologic T1 C. 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 She  Developed diffuse erythema over her breast; skin remained intact.                        PLAN: Routine followup in one month. Patient instructed to call if questions or worsening complaints in interim.   ________________________________  Lonie Peak, MD

## 2012-04-30 ENCOUNTER — Encounter: Payer: Self-pay | Admitting: Radiation Oncology

## 2012-05-01 ENCOUNTER — Ambulatory Visit
Admission: RE | Admit: 2012-05-01 | Discharge: 2012-05-01 | Disposition: A | Discharge status: Home / Self Care | Payer: BC Managed Care – PPO | Source: Ambulatory Visit | Attending: Radiation Oncology | Admitting: Radiation Oncology

## 2012-05-01 ENCOUNTER — Encounter: Payer: Self-pay | Admitting: Radiation Oncology

## 2012-05-01 VITALS — BP 131/75 | HR 66 | Temp 97.5°F | Resp 20 | Wt 259.9 lb

## 2012-05-01 DIAGNOSIS — C50419 Malignant neoplasm of upper-outer quadrant of unspecified female breast: Secondary | ICD-10-CM

## 2012-05-01 NOTE — Progress Notes (Signed)
Radiation Oncology         [phone]) [phone] ________________________________  Name: Daisy Larson MRN: [id number]  Date: 05/01/2012  DOB: 11/26/1950  Follow-Up Visit Note  Diagnosis:   pT1 C. 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 Doing well overall. She is taking Arimidex. She reports that after starting the medication she began to have violent dreams. Her her husband has not been able to sleep in the same bed because she thrashes around at night. She is wondering if this is a side effect of the medication. I recommended that she discuss this with medical oncology. Otherwise her skin is healing well. She is applying Lubriderm to her breast. It is still somewhat tender when she bumps against things with her breast.                            ALLERGIES:  is allergic to tape.  Meds: Current Outpatient Prescriptions  Medication Sig Dispense Refill  . anastrozole (ARIMIDEX) 1 MG tablet Take 1 tablet (1 mg total) by mouth daily.  30 tablet  12  . aspirin 81 MG tablet Take 81 mg by mouth daily.      . B Complex-C (SUPER B COMPLEX PO) Take 1 tablet by mouth daily.      . B-D UF III MINI PEN NEEDLES 31G X 5 MM MISC       . buPROPion (WELLBUTRIN XL) 300 MG 24 hr tablet Take 300 mg by mouth daily.       . Calcium Carbonate (CALCIUM 500 PO) Take 1 tablet by mouth daily.      . cetirizine (ZYRTEC) 10 MG tablet Take 10 mg by mouth daily.      . Cholecalciferol 1000 UNITS capsule Take 2,000 Units by mouth daily.      Marland Kitchen CINNAMON PO Take 2 tablets by mouth daily.      . DULoxetine (CYMBALTA) 60 MG capsule Take 60 mg by mouth daily.      Marland Kitchen gabapentin (NEURONTIN) 800 MG tablet 800 mg 3 (three) times daily.       Marland Kitchen glimepiride  (AMARYL) 4 MG tablet Take 4 mg by mouth 2 (two) times daily with a meal.       . HUMALOG KWIKPEN 100 UNIT/ML injection       . insulin lispro protamine-insulin lispro (HUMALOG 75/25) (75-25) 100 UNIT/ML SUSP Inject 35 Units into the skin 2 (two) times daily with a meal.      . lisinopril (PRINIVIL,ZESTRIL) 20 MG tablet Take 20 mg by mouth daily. Take in the morning      . LORazepam (ATIVAN) 2 MG tablet Take 2 mg by mouth every 6 (six) hours as needed. anxiety      . meclizine (ANTIVERT) 25 MG tablet Take 12.5 mg by mouth 3 (three) times daily as needed. vertigo      . metFORMIN (GLUCOPHAGE-XR) 500 MG 24 hr tablet Take 500 mg by mouth 2 (two) times daily.       . methocarbamol (ROBAXIN) 500 MG tablet Take 500 mg by mouth daily as needed. Muscle spasms      . MULTIPLE VITAMIN PO Take 1 tablet by mouth daily.      Marland Kitchen  omeprazole (PRILOSEC) 40 MG capsule Take 40 mg by mouth daily.      . ONE TOUCH ULTRA TEST test strip       . ONETOUCH DELICA LANCETS MISC       . pramipexole (MIRAPEX) 0.5 MG tablet Take 1 mg by mouth daily.       . pravastatin (PRAVACHOL) 40 MG tablet Take 40 mg by mouth daily.       . propranolol (INDERAL LA) 60 MG 24 hr capsule Take 60 mg by mouth daily. Take at night      . TRADJENTA 5 MG TABS tablet       . zolpidem (AMBIEN) 10 MG tablet Take 10 mg by mouth at bedtime as needed.        Physical Findings: The patient is in no acute distress. Patient is alert and oriented.  weight is 259 lb 14.4 oz (117.89 kg). Her oral temperature is 97.5 F (36.4 C). Her blood pressure is 131/75 and her pulse is 66. Her respiration is 20. Marland Kitchen  Sitting comfortably in a chair in no acute distress. Right breast is still erythematous but the skin is intact. There is some palpable scar tissue underneath the lumpectomy scar. Healing well.  Lab Findings: Lab Results  Component Value Date   WBC 6.9 01/18/2012   HGB 12.5 01/18/2012   HCT 38.5 01/18/2012   MCV 90.6 01/18/2012   PLT 142* 01/18/2012       Radiographic Findings: No results found.  Impression:  The patient is recovering from the effects of radiation.    Plan: Followup with me on a when necessary basis. Patient will continue to be followed by medical oncology while continuing antiestrogen therapy. I asked the patient to call me anytime if she has issues or concerns in the future. She and her husband are agreeable with this plan.  -----------------------------------  Lonie Peak, MD

## 2012-05-01 NOTE — Progress Notes (Signed)
Pt states she has fatigue but is resolving, right breast is "still sore and red in places". Denies loss of appetite. Pt on Arimidex.

## 2012-05-29 ENCOUNTER — Other Ambulatory Visit: Payer: Self-pay

## 2012-05-29 DIAGNOSIS — C50919 Malignant neoplasm of unspecified site of unspecified female breast: Secondary | ICD-10-CM

## 2012-05-29 MED ORDER — ANASTROZOLE 1 MG PO TABS: 1.0000 mg | ORAL_TABLET | Freq: Every day | ORAL | Status: DC

## 2012-05-29 NOTE — Progress Notes (Signed)
Received fax from [facility] in [place] requesting new prescription for anastrozole 1 mg for 90 day supply, per insurance requirements.  Sent electronically.

## 2012-07-03 ENCOUNTER — Ambulatory Visit (HOSPITAL_BASED_OUTPATIENT_CLINIC_OR_DEPARTMENT_OTHER): Payer: BC Managed Care – PPO | Admitting: Oncology

## 2012-07-03 ENCOUNTER — Other Ambulatory Visit (HOSPITAL_BASED_OUTPATIENT_CLINIC_OR_DEPARTMENT_OTHER): Payer: BC Managed Care – PPO | Admitting: Lab

## 2012-07-03 ENCOUNTER — Telehealth: Payer: Self-pay | Admitting: Oncology

## 2012-07-03 VITALS — BP 169/79 | HR 74 | Temp 98.3°F | Resp 20 | Ht 68.0 in | Wt 260.5 lb

## 2012-07-03 DIAGNOSIS — Z170 Estrogen receptor positive status [ER+]: Secondary | ICD-10-CM

## 2012-07-03 DIAGNOSIS — C50419 Malignant neoplasm of upper-outer quadrant of unspecified female breast: Secondary | ICD-10-CM

## 2012-07-03 LAB — CBC WITH DIFFERENTIAL/PLATELET
Basophils Absolute: 0 10*3/uL (ref 0.0–0.1)
Eosinophils Absolute: 0.1 10*3/uL (ref 0.0–0.5)
HCT: 32.7 % — ABNORMAL LOW (ref 34.8–46.6)
HGB: 10.6 g/dL — ABNORMAL LOW (ref 11.6–15.9)
LYMPH%: 29.2 % (ref 14.0–49.7)
MCV: 85.2 fL (ref 79.5–101.0)
MONO#: 0.3 10*3/uL (ref 0.1–0.9)
MONO%: 9.1 % (ref 0.0–14.0)
NEUT#: 2.2 10*3/uL (ref 1.5–6.5)
NEUT%: 59.5 % (ref 38.4–76.8)
Platelets: 109 10*3/uL — ABNORMAL LOW (ref 145–400)
WBC: 3.6 10*3/uL — ABNORMAL LOW (ref 3.9–10.3)

## 2012-07-03 LAB — COMPREHENSIVE METABOLIC PANEL (CC13)
Alkaline Phosphatase: 154 U/L — ABNORMAL HIGH (ref 40–150)
BUN: 9 mg/dL (ref 7.0–26.0)
CO2: 27 mEq/L (ref 22–29)
Glucose: 188 mg/dl — ABNORMAL HIGH (ref 70–99)
Total Bilirubin: 0.6 mg/dL (ref 0.20–1.20)

## 2012-07-03 LAB — CORRECTED CALCIUM (CC13): Calcium, Corrected: 9.9 mg/dL (ref 8.4–10.4)

## 2012-07-03 NOTE — Progress Notes (Signed)
ID: Daisy Larson   DOB: [birth year]  MR#: [id number]  CSN#:[id number]  HISTORY OF PRESENT ILLNESS: The patient had annual mammography 12/29/2011 at [facility], showing  an 11 mm focal nodular density in the upper outer quadrant of the right breast which had increased in size as compared to the prior study a year prior. Ultrasound showed no sonographic abnormality. 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 The patient underwent bilateral breast MRI 01/09/2012 showing a solitary enhancing mass in the lateral aspect of the right breast measuring 1.3 cm. There was no enlarged axillary or internal mammary adenopathy and no other abnormality was noted.  The patient was seen at the multidisciplinary breast cancer clinic 01/10/2012 with further treatments as detailed below  INTERVAL HISTORY: Daisy Larson returns today with her husband Daisy Larson for followup of her breast cancer. Since her last visit here she started her anastrozole therapy.  REVIEW OF SYSTEMS: She has some strange symptoms the first couple of days of the pill, including quoted zigzag in my head", but those have resolved. She has not had problems with hot flashes or arthralgias/myalgias. She sleeps poorly, describes herself as mildly fatigued, has occasional morning headaches, has some pain in the right breast, which she describes as stinging and brief, bruises easily, has chronic low back pain, some anxiety, and is concerned about a lesion on her tongue. Otherwise a detailed review of systems today was noncontributory.  PAST MEDICAL HISTORY: Past Medical History  Diagnosis Date  . Cirrhosis   . Multiple thyroid nodules   . History of stomach ulcers   . Hernia   . Colon polyp   . Neuropathy of foot   . Restless legs   .  Complication of anesthesia     WITH SECON HERNIA REPAIR 2012 [facility]  DIFFICULTY O2 SAT DROPPING ADMIT TO HOSPITAL   . Bundle branch block   . Hypertension   . Angina     COSTOCONDRITIS    . Shortness of breath     WITH EXERTION   . Diabetes mellitus   . Hepatitis      Did Not have hepatitis but needed Hep A and Hep B injections2012 SPOTS ON LIVER  DR Marcelene [place]  [phone]  . GERD (gastroesophageal reflux disease)     ULCERS  . Hyperthyroidism     NODULES ON THYROID  (DR BALEN 37  12-609)  . Goiter   . Headache     MIGRAINES  . Neuromuscular disorder     PERIPH NEUROPATHY   . Cancer     RIGHT BREAST  . Anxiety   . Depression   . Breast cancer 01/19/12     right breast lumpectomy/ER/PR positibe,her-2 neg.  . Allergy     tape  . S/P radiation therapy 02/19/12 - 04/01/12    Right Breast: 5000 cgy/25 Fractions with Boost/ 1000 cGy/5 Fractions  She underwent liver biopsy 03/28/2011, at [facility], showing evidence of early cirrhosis. There was mildly active chronic hepatitis (grade 1). She also has a history of thyroid nodules which have been stable on serial ultrasounds according to the patient  PAST SURGICAL HISTORY: Past Surgical History  Procedure Date  . Appendectomy   . Tonsillectomy   . Cesarean section   . Tubal ligation   . Abdominal hysterectomy   . Back surgery   .  Liver biopsy   . Hernia repair     X 2  . Anal fissure repair   . Carpal tunnel release     LEFT   . Colonoscopy w/ polypectomy   . Breast surgery     BIOPSY  Right Breast -  Sentinel Lymph Nodes  . Back surgery   . Liver biopsy 03/28/11    [facility] - Cirrhosis  . Chronic hepatitis   . Thyroid nodules     FAMILY HISTORY Family History  Problem Relation Age of Onset  . Cancer Maternal Grandfather     Lung Cancer  . Cancer Paternal Grandfather     Colon Cancer   the patient's father died at the age of 78, following a fall. The patient's mother died at the age of 47. 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 The patient had no brothers, 3 sisters, one of whom is present at the initial visit. There is no history of breast or ovarian cancer in the family, but one niece was diagnosed with colon cancer at the age of 56. Her paternal grandfather died from colon cancer. Her maternal grandfather died from lung cancer.  GYNECOLOGIC HISTORY: She had menarche at age 17. She went through menopause approximately age 40. She took hormone replacement on total recently. She is GX P2, first pregnancy to term age 36.  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 Her husband of 40+ years, Daisy Larson, is a Education officer, environmental. He underwent heart transplant at [facility] at May 2011 and is doing very well except for pain from costochondritis. Their children are Daisy Larson who lives in [place] and is a Chief Financial Officer, and Daisy Larson,  also living in [place], who works as a Scientist, forensic. The patient has one grandchild. She attends an independent [employer]    ADVANCED DIRECTIVES: in place  HEALTH MAINTENANCE: History  Substance Use Topics  . Smoking status: Never Smoker   . Smokeless tobacco: Not on file  . Alcohol Use: No     Colonoscopy: 2011  PAP: 2012  Bone density: 2003  Lipid panel:  Allergies  Allergen Reactions  . Tape     Current Outpatient Prescriptions  Medication Sig Dispense Refill  . aspirin 81 MG tablet Take 81 mg by mouth daily.      . B Complex-C (SUPER B COMPLEX PO) Take 1 tablet by mouth daily.      . B-D UF III MINI PEN NEEDLES 31G X 5 MM MISC       . buPROPion (WELLBUTRIN XL) 300 MG 24 hr tablet Take 300 mg by mouth daily.       . Calcium Carbonate (CALCIUM 500 PO) Take 1 tablet by mouth daily.      . cetirizine (ZYRTEC) 10 MG tablet Take 10 mg by mouth daily.      . Cholecalciferol 1000 UNITS capsule Take 2,000 Units by mouth daily.      Marland Kitchen CINNAMON  PO Take 2 tablets by mouth daily.      . DULoxetine (CYMBALTA) 60 MG capsule Take 60 mg by mouth daily.      Marland Kitchen gabapentin (NEURONTIN) 800 MG tablet 800 mg 3 (three) times daily.       Marland Kitchen HUMALOG KWIKPEN 100 UNIT/ML injection       . insulin lispro protamine-insulin lispro (HUMALOG 75/25) (75-25) 100 UNIT/ML SUSP Inject 35 Units into the skin 2 (two) times  daily with a meal.      . lisinopril (PRINIVIL,ZESTRIL) 20 MG tablet Take 20 mg by mouth daily. Take in the morning      . LORazepam (ATIVAN) 2 MG tablet Take 2 mg by mouth every 6 (six) hours as needed. anxiety      . meclizine (ANTIVERT) 25 MG tablet Take 12.5 mg by mouth 3 (three) times daily as needed. vertigo      . metFORMIN (GLUCOPHAGE-XR) 500 MG 24 hr tablet Take 500 mg by mouth 2 (two) times daily.       . methocarbamol (ROBAXIN) 500 MG tablet Take 500 mg by mouth daily as needed. Muscle spasms      . MULTIPLE VITAMIN PO Take 1 tablet by mouth daily.      Marland Kitchen omeprazole (PRILOSEC) 40 MG capsule Take 40 mg by mouth daily.      . ONE TOUCH ULTRA TEST test strip       . ONETOUCH DELICA LANCETS MISC       . pramipexole (MIRAPEX) 0.5 MG tablet Take 1 mg by mouth daily.       . pravastatin (PRAVACHOL) 40 MG tablet Take 40 mg by mouth daily.       . propranolol (INDERAL LA) 60 MG 24 hr capsule Take 60 mg by mouth daily. Take at night      . zolpidem (AMBIEN) 10 MG tablet Take 10 mg by mouth at bedtime as needed.      Marland Kitchen anastrozole (ARIMIDEX) 1 MG tablet         OBJECTIVE: Middle-aged white woman in no acute distress Filed Vitals:   07/03/12 0911  BP: 169/79  Pulse: 74  Temp: 98.3 F (36.8 C)  Resp: 20     Body mass index is 39.61 kg/(m^2).    ECOG FS: 1  Sclerae unicteric Oropharynx shows a 3 x 1.5 mm oval hypopigmented lesion in the right side of the tongue. There is no erythema. No peripheral adenopathy Lungs no rales or rhonchi Heart regular rate and rhythm Abd obese, benign MSK no focal spinal tenderness, no joint swelling or  effusion Neuro: nonfocal Breasts: The right breast is status post lumpectomy and radiation. There is hyperpigmentation over the radiation portal, which is fading. The left breast is unremarkable  LAB RESULTS: Lab Results  Component Value Date   WBC 3.6* 07/03/2012   NEUTROABS 2.2 07/03/2012   HGB 10.6* 07/03/2012   HCT 32.7* 07/03/2012   MCV 85.2 07/03/2012   PLT 109* 07/03/2012      Chemistry      Component Value Date/Time   NA 137 01/18/2012 1003   K 4.0 01/18/2012 1003   CL 98 01/18/2012 1003   CO2 28 01/18/2012 1003   BUN 9 01/18/2012 1003   CREATININE 0.56 01/18/2012 1003      Component Value Date/Time   CALCIUM 9.6 01/18/2012 1003   ALKPHOS 143* 01/18/2012 1003   AST 63* 01/18/2012 1003   ALT 44* 01/18/2012 1003   BILITOT 0.4 01/18/2012 1003       Lab Results  Component Value Date   LABCA2 25 01/10/2012    No components found with this basename: [id number]    No results found for this basename: INR:1;PROTIME:1 in the last 168 hours  Urinalysis No results found for this basename: colorurine,  appearanceur,  labspec,  phurine,  glucoseu,  hgbur,  bilirubinur,  ketonesur,  proteinur,  urobilinogen,  nitrite,  leukocytesur    STUDIES: Bone density at [facility]  04/04/2012 showed a T score at the left femoral neck of -1.9.  ASSESSMENT: 61 year [place]   (1) status post right lumpectomy 01/19/2012 for a T1c N0, stage IA invasive mucinous carcinoma, grade 1, estrogen 99% and progesterone 100% receptor positive, with an MIB of 8, and no HER-2 amplification.  (2) completed radiation 04/01/2012  (3) on anastrozole since June 2013  PLAN:  She is tolerating the anastrozole well, and the plan will be to continue that at least for 2 years and then reassess. We will obtain a repeat bone density 2 years from now to help [place] make that decision. She will see [place] every 3 months  for the first 2 years of followup, unless she has an appointment with her surgeon or gynecologist, which  would allow her to skip a visit with [place]. 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 At this point I am encouraged thatshe appears to be tolerating her anastrozole without significant problems. She knows to call for any problems that may develop before the next visit. MAGRINAT,GUSTAV C    07/03/2012

## 2012-07-03 NOTE — Telephone Encounter (Signed)
gve the pt her dec 2013 appt calendar °

## 2012-08-22 ENCOUNTER — Emergency Department (INDEPENDENT_AMBULATORY_CARE_PROVIDER_SITE_OTHER): Payer: BC Managed Care – PPO

## 2012-08-22 ENCOUNTER — Encounter (HOSPITAL_COMMUNITY): Payer: Self-pay | Admitting: Emergency Medicine

## 2012-08-22 ENCOUNTER — Emergency Department (HOSPITAL_COMMUNITY)
Admission: EM | Admit: 2012-08-22 | Discharge: 2012-08-22 | Disposition: A | Discharge status: Home / Self Care | Payer: BC Managed Care – PPO | Source: Home / Self Care | Attending: Emergency Medicine | Admitting: Emergency Medicine

## 2012-08-22 DIAGNOSIS — S8000XA Contusion of unspecified knee, initial encounter: Secondary | ICD-10-CM

## 2012-08-22 DIAGNOSIS — S63509A Unspecified sprain of unspecified wrist, initial encounter: Secondary | ICD-10-CM

## 2012-08-22 DIAGNOSIS — S20219A Contusion of unspecified front wall of thorax, initial encounter: Secondary | ICD-10-CM

## 2012-08-22 MED ORDER — HYDROCODONE-ACETAMINOPHEN 5-325 MG PO TABS: ORAL_TABLET | ORAL | Status: DC

## 2012-08-22 NOTE — ED Notes (Signed)
Reports falling today around 4:30pm hurt her right arm,  Left leg and left rib

## 2012-08-22 NOTE — ED Notes (Addendum)
Pt fell around 4-4:30 today.   Pt is c/o pain in her right wrist, left knee and ribs.

## 2012-08-22 NOTE — ED Provider Notes (Signed)
Chief Complaint  Patient presents with  . Fall    History of Present Illness:   Daisy Larson is a 61 year old female who tripped and fell this afternoon at home, striking a door frame. There was no loss of consciousness or fainting. She states she just tripped. She ended up landing on the floor. She did hit her head but there was no loss of consciousness and right now she denies any headache or pain over the skull or facial bones. She denies any neck pain or pain in her shoulders or elbows. There is some pain and swelling of her right wrist. It hurts to move. She denies any pain in the hand and is able to move all her digits well. She denies any numbness or tingling. There's no pain in the left arm, back, or abdomen. She has some minor pain in her left knee but is able to walk. The pain is localized just below the patella. There's not much swelling but she does have an abrasion in this area. She denies any pain in the right knee or either foot. She also has some pain in her left lateral chest area which hurts with deep inspiration. She denies any shortness of breath or hemoptysis.  Review of Systems:  Other than noted above, the patient denies any of the following symptoms: Systemic:  No fevers or chills. Eye:  No diplopia or blurred vision. ENT:  No headache, facial pain, or bleeding from the nose or ears.  No loose or broken teeth. Neck:  No neck pain or stiffnes. Resp:  No shortness of breath. Cardiac:  No chest pain. No palpitations, dizziness, syncope or fainting. GI:  No abdominal pain. No nausea, vomiting, or diarrhea. GU:  No blood in urine. M-S:  No extremity pain, swelling, bruising, limited ROM, neck or back pain. Neuro:  No headache, loss of consciousness, seizure activity, dizziness, vertigo, paresthesias, numbness, or weakness.  No difficulty with speech or ambulation.   PMFSH:  Past medical history, family history, social history, meds, and allergies were reviewed.  Physical  Exam:   Vital signs:  BP 150/82  Pulse 76  Temp 98.1 F (36.7 C) (Oral) General:  Alert, oriented and in no distress. Eye:  PERRL, full EOMs. ENT:  No cranial or facial tenderness to palpation. Neck:  No tenderness to palpation.  Full ROM without pain. Heart:  Regular rhythm.  No extrasystoles, gallops, or murmers. Lungs:  There is tenderness to palpation over the left lateral chest wall area, no swelling bruising or deformity. Breath sounds clear and equal bilaterally.  No wheezes, rales or rhonchi. Abdomen:  Non tender. Back:  Non tender to palpation.  Full ROM without pain. Extremities:  There is some swelling and pain to palpation over the right wrist, but no bruising. She did have some tenderness to palpation over the anatomical snuff box area. Exam of the left knee reveals an abrasion just below the patella. There is pain in this area to palpation but the knee had a full range of motion with no pain. There was otherwise no tenderness, swelling, bruising or deformity.  Full ROM of all joints without pain.  Pulses full.  Brisk capillary refill. Neuro:  Alert and oriented times 3.  Cranial nerves intact.  No muscle weakness.  Sensation intact to light touch.  Gait normal. Skin:  No bruising, abrasions, or lacerations.  Radiology:  Dg Ribs Unilateral W/chest Left  08/22/2012  *RADIOLOGY REPORT*  Clinical Data: Fall, left chest/rib pain  LEFT  RIBS AND CHEST - 3+ VIEW  Comparison: Chest radiographs dated 01/18/2012  Findings: Lungs are essentially clear. No pleural effusion or pneumothorax.  The heart is top normal in size for inspiration.  No displaced left rib fracture is seen.  Surgical clips along the right chest wall / axilla.  IMPRESSION: No displaced left rib fracture is seen.   Original Report Authenticated By: Charline Bills, M.D.    Dg Wrist Complete Right  08/22/2012  *RADIOLOGY REPORT*  Clinical Data: Fall.  Wrist pain.  RIGHT WRIST - COMPLETE 3+ VIEW  Comparison: None.   Findings: Mild degenerative changes are noted at the first [facility] joint.  The cold bones are located.  Mineralization is within normal limits.  No acute bone or soft tissue abnormality is present.  IMPRESSION:  1.  Mild degenerative change. 2.  No acute abnormality.   Original Report Authenticated By: Jamesetta Orleans. MATTERN, M.D.    Dg Knee Complete 4 Views Left  08/22/2012  *RADIOLOGY REPORT*  Clinical Data: Fall.  LEFT KNEE - COMPLETE 4+ VIEW  Comparison: None.  Findings: Four views of the left knee were obtained.  Negative for fracture or dislocation.  No definite suprapatellar joint effusion. Normal alignment of the knee.  IMPRESSION: No acute bony abnormality.   Original Report Authenticated By: Richarda Overlie, M.D.    Course in Urgent Care Center:   She was placed in a thumb spica splint and should leave this on continuously for 2 weeks, removing only for bathing and showering purposes. If there is still pain in 2 weeks I suggested she come back here for recheck. She was placed in a knee sleeve and may leave this in place for 2 weeks as well and she can remove it at nighttime.  Assessment:  The primary encounter diagnosis was Wrist sprain. Diagnoses of Knee contusion and Chest wall contusion were also pertinent to this visit.  Plan:   1.  The following meds were prescribed:   New Prescriptions   HYDROCODONE-ACETAMINOPHEN (NORCO/VICODIN) 5-325 MG PER TABLET    1 to 2 tabs every 4 to 6 hours as needed for pain.   2.  The patient was instructed in symptomatic care and handouts were given. 3.  The patient was told to return if becoming worse in any way, if no better in 3 or 4 days, and given some red flag symptoms that would indicate earlier return.    Reuben Likes, MD 08/22/12 [phone]

## 2012-09-06 ENCOUNTER — Other Ambulatory Visit: Payer: Self-pay | Admitting: Oncology

## 2012-09-21 ENCOUNTER — Encounter (HOSPITAL_COMMUNITY): Payer: Self-pay

## 2012-09-21 ENCOUNTER — Emergency Department (HOSPITAL_COMMUNITY)
Admission: EM | Admit: 2012-09-21 | Discharge: 2012-09-21 | Disposition: A | Discharge status: Home / Self Care | Payer: BC Managed Care – PPO | Source: Home / Self Care

## 2012-09-21 DIAGNOSIS — M25512 Pain in left shoulder: Secondary | ICD-10-CM

## 2012-09-21 DIAGNOSIS — M609 Myositis, unspecified: Secondary | ICD-10-CM

## 2012-09-21 DIAGNOSIS — M25519 Pain in unspecified shoulder: Secondary | ICD-10-CM

## 2012-09-21 DIAGNOSIS — IMO0001 Reserved for inherently not codable concepts without codable children: Secondary | ICD-10-CM

## 2012-09-21 MED ORDER — HYDROCODONE-ACETAMINOPHEN 5-325 MG PO TABS: 1.0000 | ORAL_TABLET | ORAL | Status: DC | PRN

## 2012-09-21 NOTE — ED Notes (Signed)
Patient is resting comfortably. 

## 2012-09-21 NOTE — ED Notes (Signed)
Recheck of left shoulder , right arm from fall 1 month ago

## 2012-09-21 NOTE — ED Provider Notes (Signed)
Medical screening examination/treatment/procedure(s) were performed by resident physician or non-physician practitioner and as supervising physician I was immediately available for consultation/collaboration.   Barkley Bruns MD.    Linna Hoff, MD 09/21/12 [phone]

## 2012-09-21 NOTE — ED Notes (Signed)
Patient re-assured about wait time

## 2012-09-21 NOTE — ED Provider Notes (Signed)
History     CSN: [id number]  Arrival date & time 09/21/12  1205   None     Chief Complaint  Patient presents with  . Fall    (Consider location/radiation/quality/duration/timing/severity/associated sxs/prior treatment) HPI Comments: 61 year old female presents for evaluation of left upper arm and shoulder pain status post fall October 24. She was evaluated for that fall [facility] receiving x-rays of the wrist knee and leg. His x-rays were negative for bony injuries. She is gradually recovering from those injuries with minor aches and pains. A few days, less than a week she began to develop pain in the left trapezius and extensor muscles of the left upper arm. This is gradually been getting worse over the past 3-4 weeks. The pain is elicited or exacerbated with abduction. She is left-handed she uses that arm primarily to reach overhead and complete list of her ADLs and other tasks. She denies injury to the shoulder joint itself.   Past Medical History  Diagnosis Date  . Cirrhosis   . Multiple thyroid nodules   . History of stomach ulcers   . Hernia   . Colon polyp   . Neuropathy of foot   . Restless legs   . Complication of anesthesia     WITH SECON HERNIA REPAIR 2012 [facility]  DIFFICULTY O2 SAT DROPPING ADMIT TO HOSPITAL   . Bundle branch block   . Hypertension   . Angina     COSTOCONDRITIS    . Shortness of breath     WITH EXERTION   . Diabetes mellitus   . Hepatitis      Did Not have hepatitis but needed Hep A and Hep B injections2012 SPOTS ON LIVER  DR Marcelene Butte  [phone]  . GERD (gastroesophageal reflux disease)     ULCERS  . Hyperthyroidism     NODULES ON THYROID  (DR BALEN 37  12-609)  . Goiter   . Headache     MIGRAINES  . Neuromuscular disorder     PERIPH NEUROPATHY   . Cancer     RIGHT BREAST  . Anxiety   . Depression   . Breast cancer 01/19/12     right breast lumpectomy/ER/PR positibe,her-2 neg.  . Allergy     tape  . S/P  radiation therapy 02/19/12 - 04/01/12    Right Breast: 5000 cgy/25 Fractions with Boost/ 1000 cGy/5 Fractions    Past Surgical History  Procedure Date  . Appendectomy   . Tonsillectomy   . Cesarean section   . Tubal ligation   . Abdominal hysterectomy   . Back surgery   . Liver biopsy   . Hernia repair     X 2  . Anal fissure repair   . Carpal tunnel release     LEFT   . Colonoscopy w/ polypectomy   . Breast surgery     BIOPSY  Right Breast -  Sentinel Lymph Nodes  . Back surgery   . Liver biopsy 03/28/11    [facility] - Cirrhosis  . Chronic hepatitis   . Thyroid nodules     Family History  Problem Relation Age of Onset  . Cancer Maternal Grandfather     Lung Cancer  . Cancer Paternal Grandfather     Colon Cancer    History  Substance Use Topics  . Smoking status: Never Smoker   . Smokeless tobacco: Not on file  . Alcohol Use: No    OB History  Grav Para Term Preterm Abortions TAB SAB Ect Mult Living   2 2             Obstetric Comments    Menarch age15G2, 53, parity age 67, Use of HRT short period of time      Review of Systems  Constitutional: Negative for fever, chills and activity change.  HENT: Negative.   Respiratory: Negative.   Cardiovascular: Negative.   Genitourinary: Negative.   Musculoskeletal:       As per HPI  Skin: Negative.  Negative for color change, pallor and rash.  Neurological: Negative.   Psychiatric/Behavioral: Negative.     Allergies  Tape  Home Medications   Current Outpatient Rx  Name  Route  Sig  Dispense  Refill  . ANASTROZOLE 1 MG PO TABS               . ANASTROZOLE 1 MG PO TABS      TAKE 1 TABLET BY MOUTH EVERY DAY   90 tablet   0   . ASPIRIN 81 MG PO TABS   Oral   Take 81 mg by mouth daily.         . SUPER B COMPLEX PO   Oral   Take 1 tablet by mouth daily.         . BD PEN NEEDLE MINI U/F 31G X 5 MM MISC               . BUPROPION HCL ER (XL) 300 MG PO TB24   Oral   Take 300  mg by mouth daily.          Marland Kitchen CALCIUM 500 PO   Oral   Take 1 tablet by mouth daily.         Marland Kitchen CETIRIZINE HCL 10 MG PO TABS   Oral   Take 10 mg by mouth daily.         . CHOLECALCIFEROL 1000 UNITS PO CAPS   Oral   Take 2,000 Units by mouth daily.         Marland Kitchen CINNAMON PO   Oral   Take 2 tablets by mouth daily.         . DULOXETINE HCL 60 MG PO CPEP   Oral   Take 60 mg by mouth daily.         Marland Kitchen GABAPENTIN 800 MG PO TABS      800 mg 3 (three) times daily.          Marland Kitchen HUMALOG KWIKPEN 100 UNIT/ML [place] SOLN               . HYDROCODONE-ACETAMINOPHEN 5-325 MG PO TABS      1 to 2 tabs every 4 to 6 hours as needed for pain.   20 tablet   0   . HYDROCODONE-ACETAMINOPHEN 5-325 MG PO TABS   Oral   Take 1 tablet by mouth every 4 (four) hours as needed for pain.   20 tablet   0   . INSULIN LISPRO PROT & LISPRO (75-25) 100 UNIT/ML [place] SUSP   Subcutaneous   Inject 35 Units into the skin 2 (two) times daily with a meal.         . LISINOPRIL 20 MG PO TABS   Oral   Take 20 mg by mouth daily. Take in the morning         . LORAZEPAM 2 MG PO TABS   Oral   Take 2 mg by mouth  every 6 (six) hours as needed. anxiety         . MECLIZINE HCL 25 MG PO TABS   Oral   Take 12.5 mg by mouth 3 (three) times daily as needed. vertigo         . METFORMIN HCL ER 500 MG PO TB24   Oral   Take 500 mg by mouth 2 (two) times daily.          Marland Kitchen METHOCARBAMOL 500 MG PO TABS   Oral   Take 500 mg by mouth daily as needed. Muscle spasms         . MULTIPLE VITAMIN PO   Oral   Take 1 tablet by mouth daily.         Marland Kitchen OMEPRAZOLE 40 MG PO CPDR   Oral   Take 40 mg by mouth daily.         . ONETOUCH ULTRA BLUE VI STRP               . ONETOUCH DELICA LANCETS MISC               . PRAMIPEXOLE DIHYDROCHLORIDE 0.5 MG PO TABS   Oral   Take 1 mg by mouth daily.          Marland Kitchen PRAVASTATIN SODIUM 40 MG PO TABS   Oral   Take 40 mg by mouth daily.          Marland Kitchen  PROPRANOLOL HCL ER 60 MG PO CP24   Oral   Take 60 mg by mouth daily. Take at night         . ZOLPIDEM TARTRATE 10 MG PO TABS   Oral   Take 10 mg by mouth at bedtime as needed.           BP 169/72  Pulse 72  Resp 18  SpO2 100%  Physical Exam  Constitutional: She is oriented to person, place, and time. She appears well-developed and well-nourished. No distress.  HENT:  Head: Normocephalic and atraumatic.  Eyes: EOM are normal. Pupils are equal, round, and reactive to light.  Neck: Normal range of motion. Neck supple.  Pulmonary/Chest: Effort normal.  Musculoskeletal: She exhibits tenderness. She exhibits no edema.       Range of motion of the left upper arm and shoulder joint is complaint with the exception of abduction which is limited to 90. Limitations due to pain of the trapezius and deltoid muscles. There is tenderness over the left upper trapezius it extends to the left supraspinatus, the left deltoid and bicep muscle. There is no bone tenderness or joint tenderness. Internal and external rotation is complete she is able to place her left arm behind her back and across her shoulder. Distal neurovascular motor sensory is intact.  Lymphadenopathy:    She has no cervical adenopathy.  Neurological: She is alert and oriented to person, place, and time. No cranial nerve deficit.  Skin: Skin is warm and dry.  Psychiatric: She has a normal mood and affect.    ED Course  Procedures (including critical care time)  Labs Reviewed - No data to display No results found.   1. Myofasciitis   2. Shoulder pain, left       MDM  Wear the arm sling on the left arm for approximately one week.  limit overhead work and limit the amount of abduction to the left arm and shoulder. Apply heat to the upper shoulder and deltoid. We discussed ThermaCare asked to apply  for up to 8 hours per day. I had recommended NSAIDs however she says due to ulcers and other GI problems she cannot take any  sort of NSAID for inflammation. Norco 03/01/2024 one every 4 hours when necessary pain #20        Hayden Rasmussen, NP 09/21/12 1505

## 2012-10-07 ENCOUNTER — Inpatient Hospital Stay (HOSPITAL_COMMUNITY)
Admission: EM | Admit: 2012-10-07 | Discharge: 2012-10-10 | DRG: 016 | Disposition: A | Discharge status: Home / Self Care | Payer: BC Managed Care – PPO | Attending: Internal Medicine | Admitting: Internal Medicine

## 2012-10-07 ENCOUNTER — Ambulatory Visit: Payer: BC Managed Care – PPO | Admitting: Physician Assistant

## 2012-10-07 ENCOUNTER — Encounter (HOSPITAL_COMMUNITY): Payer: Self-pay | Admitting: Emergency Medicine

## 2012-10-07 ENCOUNTER — Inpatient Hospital Stay (HOSPITAL_COMMUNITY): Payer: BC Managed Care – PPO

## 2012-10-07 ENCOUNTER — Emergency Department (HOSPITAL_COMMUNITY): Payer: BC Managed Care – PPO

## 2012-10-07 ENCOUNTER — Other Ambulatory Visit: Payer: BC Managed Care – PPO | Admitting: Lab

## 2012-10-07 DIAGNOSIS — C50419 Malignant neoplasm of upper-outer quadrant of unspecified female breast: Secondary | ICD-10-CM

## 2012-10-07 DIAGNOSIS — G9340 Encephalopathy, unspecified: Secondary | ICD-10-CM | POA: Diagnosis present

## 2012-10-07 DIAGNOSIS — Z79899 Other long term (current) drug therapy: Secondary | ICD-10-CM

## 2012-10-07 DIAGNOSIS — F5089 Other specified eating disorder: Secondary | ICD-10-CM | POA: Diagnosis present

## 2012-10-07 DIAGNOSIS — Z853 Personal history of malignant neoplasm of breast: Secondary | ICD-10-CM

## 2012-10-07 DIAGNOSIS — G2581 Restless legs syndrome: Secondary | ICD-10-CM | POA: Diagnosis present

## 2012-10-07 DIAGNOSIS — G43909 Migraine, unspecified, not intractable, without status migrainosus: Secondary | ICD-10-CM | POA: Diagnosis present

## 2012-10-07 DIAGNOSIS — E119 Type 2 diabetes mellitus without complications: Secondary | ICD-10-CM | POA: Diagnosis present

## 2012-10-07 DIAGNOSIS — C50919 Malignant neoplasm of unspecified site of unspecified female breast: Secondary | ICD-10-CM

## 2012-10-07 DIAGNOSIS — K7460 Unspecified cirrhosis of liver: Secondary | ICD-10-CM | POA: Diagnosis present

## 2012-10-07 DIAGNOSIS — D61818 Other pancytopenia: Secondary | ICD-10-CM | POA: Diagnosis present

## 2012-10-07 DIAGNOSIS — Z860100 Personal history of colon polyps, unspecified: Secondary | ICD-10-CM

## 2012-10-07 DIAGNOSIS — I10 Essential (primary) hypertension: Secondary | ICD-10-CM | POA: Diagnosis present

## 2012-10-07 DIAGNOSIS — G4733 Obstructive sleep apnea (adult) (pediatric): Secondary | ICD-10-CM | POA: Diagnosis present

## 2012-10-07 DIAGNOSIS — D649 Anemia, unspecified: Secondary | ICD-10-CM | POA: Diagnosis present

## 2012-10-07 DIAGNOSIS — K219 Gastro-esophageal reflux disease without esophagitis: Secondary | ICD-10-CM | POA: Diagnosis present

## 2012-10-07 DIAGNOSIS — F5083 Pica in adults: Secondary | ICD-10-CM | POA: Diagnosis present

## 2012-10-07 DIAGNOSIS — Z8711 Personal history of peptic ulcer disease: Secondary | ICD-10-CM

## 2012-10-07 DIAGNOSIS — E782 Mixed hyperlipidemia: Secondary | ICD-10-CM | POA: Diagnosis present

## 2012-10-07 DIAGNOSIS — Z7982 Long term (current) use of aspirin: Secondary | ICD-10-CM

## 2012-10-07 DIAGNOSIS — E1149 Type 2 diabetes mellitus with other diabetic neurological complication: Secondary | ICD-10-CM | POA: Diagnosis present

## 2012-10-07 DIAGNOSIS — G609 Hereditary and idiopathic neuropathy, unspecified: Secondary | ICD-10-CM | POA: Diagnosis present

## 2012-10-07 DIAGNOSIS — E1142 Type 2 diabetes mellitus with diabetic polyneuropathy: Secondary | ICD-10-CM | POA: Diagnosis present

## 2012-10-07 DIAGNOSIS — R55 Syncope and collapse: Secondary | ICD-10-CM | POA: Diagnosis present

## 2012-10-07 LAB — COMPREHENSIVE METABOLIC PANEL
ALT: 40 U/L — ABNORMAL HIGH (ref 0–35)
Alkaline Phosphatase: 161 U/L — ABNORMAL HIGH (ref 39–117)
CO2: 30 mEq/L (ref 19–32)
GFR calc Af Amer: >90 mL/min (ref >90)
Glucose, Bld: 240 mg/dL — ABNORMAL HIGH (ref 70–99)
Potassium: 4.3 mEq/L (ref 3.5–5.1)
Sodium: 142 mEq/L (ref 135–145)
Total Protein: 7.3 g/dL (ref 6.0–8.3)

## 2012-10-07 LAB — URINALYSIS, ROUTINE W REFLEX MICROSCOPIC
Hgb urine dipstick: NEGATIVE
Protein, ur: NEGATIVE mg/dL
Urobilinogen, UA: 1 mg/dL (ref 0.0–1.0)
pH: 5 (ref 5.0–8.0)

## 2012-10-07 LAB — POCT I-STAT 3, ART BLOOD GAS (G3+)
Acid-Base Excess: 3 mmol/L — ABNORMAL HIGH (ref 0.0–2.0)
Bicarbonate: 29.7 mEq/L — ABNORMAL HIGH (ref 20.0–24.0)
TCO2: 31 mmol/L (ref 0–100)
pH, Arterial: 7.363 (ref 7.350–7.450)

## 2012-10-07 LAB — CBC
Hemoglobin: 10.9 g/dL — ABNORMAL LOW (ref 12.0–15.0)
MCH: 27.3 pg (ref 26.0–34.0)
MCV: 86.9 fL (ref 78.0–100.0)
Platelets: 102 10*3/uL — ABNORMAL LOW (ref 150–400)
RBC: 4.04 MIL/uL (ref 3.87–5.11)
RBC: 3.81 MIL/uL — ABNORMAL LOW (ref 3.87–5.11)
WBC: 6.5 10*3/uL (ref 4.0–10.5)

## 2012-10-07 LAB — HEMOGLOBIN A1C
Hgb A1c MFr Bld: 7.2 % — ABNORMAL HIGH (ref <5.7)
Mean Plasma Glucose: 160 mg/dL — ABNORMAL HIGH (ref <117)

## 2012-10-07 LAB — T4, FREE: Free T4: 0.86 ng/dL (ref 0.80–1.80)

## 2012-10-07 LAB — GLUCOSE, CAPILLARY

## 2012-10-07 LAB — TSH: TSH: 1.589 u[IU]/mL (ref 0.350–4.500)

## 2012-10-07 LAB — SEDIMENTATION RATE: Sed Rate: 41 mm/hr — ABNORMAL HIGH (ref 0–22)

## 2012-10-07 MED ORDER — SODIUM CHLORIDE 0.9 % IV SOLN
INTRAVENOUS | Status: DC
  Administered 2012-10-07 – 2012-10-08 (×2): 1000 mL via INTRAVENOUS

## 2012-10-07 MED ORDER — ONDANSETRON HCL 4 MG/2ML IJ SOLN: 4.0000 mg | Freq: Four times a day (QID) | INTRAMUSCULAR | Status: DC | PRN

## 2012-10-07 MED ORDER — ENOXAPARIN SODIUM 40 MG/0.4ML ~~LOC~~ SOLN
40.0000 mg | SUBCUTANEOUS | Status: DC
  Administered 2012-10-07 – 2012-10-09 (×3): 40 mg via SUBCUTANEOUS
  Filled 2012-10-07 (×5): qty 0.4

## 2012-10-07 MED ORDER — GADOBENATE DIMEGLUMINE 529 MG/ML IV SOLN
20.0000 mL | Freq: Once | INTRAVENOUS | Status: AC | PRN
  Administered 2012-10-07: 20 mL via INTRAVENOUS

## 2012-10-07 MED ORDER — NALOXONE HCL 0.4 MG/ML IJ SOLN: 0.4000 mg | INTRAMUSCULAR | Status: DC | PRN

## 2012-10-07 MED ORDER — ACETAMINOPHEN 325 MG PO TABS
650.0000 mg | ORAL_TABLET | Freq: Four times a day (QID) | ORAL | Status: DC | PRN
  Administered 2012-10-08 – 2012-10-09 (×2): 650 mg via ORAL
  Filled 2012-10-07 (×2): qty 2

## 2012-10-07 MED ORDER — SODIUM CHLORIDE 0.9 % IV SOLN
INTRAVENOUS | Status: DC
  Administered 2012-10-07: 16:00:00 via INTRAVENOUS

## 2012-10-07 MED ORDER — ACETAMINOPHEN 650 MG RE SUPP: 650.0000 mg | Freq: Four times a day (QID) | RECTAL | Status: DC | PRN

## 2012-10-07 MED ORDER — INSULIN ASPART 100 UNIT/ML ~~LOC~~ SOLN
0.0000 [IU] | Freq: Three times a day (TID) | SUBCUTANEOUS | Status: DC
  Administered 2012-10-08: 3 [IU] via SUBCUTANEOUS
  Administered 2012-10-08: 5 [IU] via SUBCUTANEOUS
  Administered 2012-10-09: 3 [IU] via SUBCUTANEOUS
  Administered 2012-10-09 (×2): 2 [IU] via SUBCUTANEOUS
  Administered 2012-10-10: 3 [IU] via SUBCUTANEOUS
  Administered 2012-10-10: 1 [IU] via SUBCUTANEOUS

## 2012-10-07 MED ORDER — ONDANSETRON HCL 4 MG PO TABS: 4.0000 mg | ORAL_TABLET | Freq: Four times a day (QID) | ORAL | Status: DC | PRN

## 2012-10-07 NOTE — ED Notes (Signed)
Tech placed patient on cardiac monitor, applied pink restrictive extremity armband and yellow fall risk armband on patient.

## 2012-10-07 NOTE — ED Notes (Signed)
PA at bedside. Neurology  

## 2012-10-07 NOTE — ED Notes (Signed)
Family at bedside. 

## 2012-10-07 NOTE — ED Notes (Signed)
Received pt from home with diaphoresis onset last night. Husband called EMS because he was unable to wake pt up. Upon arrival of EMS pt was completely unresponsive. Pt given 2mg  Narcan by EMS, pt became responsive. Currently pt easily to arouse but will fall back to sleep and snore. Pt reports taken only one of her pain medications last night.

## 2012-10-07 NOTE — ED Notes (Signed)
Pts sister, husband and daughter are in room.  Stated patient would not wake up and answer questions.  Family reporting  Patient has been falling into a deep slumber for over a year and does not always answer questions appropriately.  Pt is now answering questions appropriately and is aroused since receiving Narcan in ambulance.

## 2012-10-07 NOTE — Consult Note (Signed)
NEURO HOSPITALIST CONSULT NOTE    Reason for Consult: 2 year progressive confusion and periods AMS  HPI:                                                                                                                                          Daisy Larson is an 61 y.o. female who over the last two yeas to show increasing somnolence, decreased memory and periods of staring spells.  These spells started after he husband had multiple cardiac issues and she became the sole provider. Over time family has noted while she is on the computer she will often daze off or breifly fall asleep.  Initially she was easily awakened by calling her name.  Recently she has been harder to arouse when she falls asleep. Patients daughter notes she dose drive and has never been in a car accident but often times will "daze off" and forget where she was going.  Patient has never fallen or lost ability to keep herself upright.  The episodes do not occur while walking. Until this hospitalization patient has had no jerking of arms or legs.  While in ED patients family has noted right arm tremor at times --both when alert and talking and during one episode in which she was non responsive.  Eyes have not been noted to deviated.   Past Medical History  Diagnosis Date  . Cirrhosis   . Multiple thyroid nodules   . History of stomach ulcers   . Hernia   . Colon polyp   . Neuropathy of foot   . Restless legs   . Complication of anesthesia     WITH SECON HERNIA REPAIR 2012 [facility]  DIFFICULTY O2 SAT DROPPING ADMIT TO HOSPITAL   . Bundle branch block   . Hypertension   . Angina     COSTOCONDRITIS    . Shortness of breath     WITH EXERTION   . Diabetes mellitus   . Hepatitis      Did Not have hepatitis but needed Hep A and Hep B injections2012 SPOTS ON LIVER  DR Marcelene Butte  [phone]  . GERD (gastroesophageal reflux disease)     ULCERS  . Hyperthyroidism     NODULES ON THYROID  (DR BALEN 37   12-609)  . Goiter   . Headache     MIGRAINES  . Neuromuscular disorder     PERIPH NEUROPATHY   . Cancer     RIGHT BREAST  . Anxiety   . Depression   . Breast cancer 01/19/12     right breast lumpectomy/ER/PR positibe,her-2 neg.  . Allergy     tape  . S/P radiation therapy 02/19/12 - 04/01/12    Right Breast: 5000 cgy/25 Fractions with Boost/ 1000 cGy/5 Fractions  Past Surgical History  Procedure Date  . Appendectomy   . Tonsillectomy   . Cesarean section   . Tubal ligation   . Abdominal hysterectomy   . Back surgery   . Liver biopsy   . Hernia repair     X 2  . Anal fissure repair   . Carpal tunnel release     LEFT   . Colonoscopy w/ polypectomy   . Breast surgery     BIOPSY  Right Breast -  Sentinel Lymph Nodes  . Back surgery   . Liver biopsy 03/28/11    [facility] - Cirrhosis  . Chronic hepatitis   . Thyroid nodules   . Spinal fusion     Family History  Problem Relation Age of Onset  . Cancer Maternal Grandfather     Lung Cancer  . Cancer Paternal Grandfather     Colon Cancer     Social History:  reports that she has never smoked. She does not have any smokeless tobacco history on file. She reports that she does not drink alcohol or use illicit drugs.  Allergies  Allergen Reactions  . Tape   . Oxymorphone     lathargic    MEDICATIONS:                                                                                                                     Current Facility-Administered Medications  Medication Dose Route Frequency Provider Last Rate Last Dose  . 0.9 %  sodium chloride infusion   Intravenous Continuous Suzi Roots, MD      . naloxone [facility]) injection 0.4 mg  0.4 mg Intravenous PRN Henderson Cloud, MD       Current Outpatient Prescriptions  Medication Sig Dispense Refill  . anastrozole (ARIMIDEX) 1 MG tablet Take 1 mg by mouth daily.      Marland Kitchen aspirin 81 MG tablet Take 81 mg by mouth daily.      . B Complex-C (SUPER B  COMPLEX PO) Take 1 tablet by mouth daily.      . B-D UF III MINI PEN NEEDLES 31G X 5 MM MISC       . calcium-vitamin D (OSCAL WITH D) 500-200 MG-UNIT per tablet Take 1 tablet by mouth daily.      . cetirizine (ZYRTEC) 10 MG tablet Take 10 mg by mouth daily.      . Cholecalciferol 1000 UNITS capsule Take 2,000 Units by mouth daily.      Marland Kitchen CINNAMON PO Take 2 tablets by mouth daily.      . DULoxetine (CYMBALTA) 60 MG capsule Take 60 mg by mouth daily.      Marland Kitchen gabapentin (NEURONTIN) 800 MG tablet 800 mg 3 (three) times daily.       Chilton Si Tea, Camillia sinensis, (GREEN TEA EXTRACT PO) Take 1 tablet by mouth daily. 400mg       . insulin lispro protamine-insulin lispro (HUMALOG 75/25) (75-25) 100 UNIT/ML SUSP Inject 60-70 Units  into the skin 2 (two) times daily with a meal. 70 units in the morning and 60 units in the evening      . lisinopril (PRINIVIL,ZESTRIL) 20 MG tablet Take 20 mg by mouth every morning. Take in the morning      . LORazepam (ATIVAN) 2 MG tablet Take 2 mg by mouth every 6 (six) hours as needed. anxiety      . Magnesium 400 MG CAPS Take 1 capsule by mouth daily.      . meclizine (ANTIVERT) 25 MG tablet Take 12.5 mg by mouth 3 (three) times daily as needed. vertigo      . metFORMIN (GLUCOPHAGE-XR) 500 MG 24 hr tablet Take 1,000 mg by mouth 2 (two) times daily.       . MULTIPLE VITAMIN PO Take 1 tablet by mouth daily.      . Omega-3 Fatty Acids (EQL OMEGA 3 FISH OIL) 1400 MG CAPS Take 1 capsule by mouth daily.      Marland Kitchen omeprazole (PRILOSEC) 40 MG capsule Take 40 mg by mouth daily.      . ONE TOUCH ULTRA TEST test strip       . ONETOUCH DELICA LANCETS MISC       . OVER THE COUNTER MEDICATION Take 1 tablet by mouth at bedtime as needed. "Prunelax"      . OVER THE COUNTER MEDICATION Take 1 tablet by mouth daily. "Grape seed and resveratrol"      . pramipexole (MIRAPEX) 1 MG tablet Take 1 mg by mouth at bedtime.      . pravastatin (PRAVACHOL) 40 MG tablet Take 40 mg by mouth daily.        . propranolol (INDERAL LA) 60 MG 24 hr capsule Take 60 mg by mouth daily. Take at night      . vitamin B-12 (CYANOCOBALAMIN) 1000 MCG tablet Take 1,000 mcg by mouth daily.      . [DISCONTINUED] pramipexole (MIRAPEX) 0.5 MG tablet Take 1 mg by mouth daily.           ROS:                                                                                                                                       History obtained from the patient  General ROS: positive for - increased sweating Psychological ROS: positive for - depression and increased anxiety Ophthalmic ROS: negative for - blurry vision, double vision, eye pain or loss of vision ENT ROS: negative for - epistaxis, nasal discharge, oral lesions, sore throat, tinnitus or vertigo Allergy and Immunology ROS: negative for - hives or itchy/watery eyes Hematological and Lymphatic ROS: negative for - bleeding problems, bruising or swollen lymph nodes Endocrine ROS: negative for - galactorrhea, hair pattern changes, polydipsia/polyuria or temperature intolerance Respiratory ROS: negative for - cough, hemoptysis, shortness of breath or wheezing Cardiovascular ROS: negative for - chest pain, dyspnea on exertion,  edema or irregular heartbeat Gastrointestinal ROS: negative for - abdominal pain, diarrhea, hematemesis, nausea/vomiting or stool incontinence Genito-Urinary ROS: negative for - dysuria, hematuria, incontinence or urinary frequency/urgency Musculoskeletal ROS: negative for - joint swelling or muscular weakness Neurological ROS: as noted in HPI Dermatological ROS: negative for rash and skin lesion changes   Blood pressure 139/81, pulse 79, temperature 98.8 F (37.1 C), temperature source Oral, resp. rate 23, SpO2 98.00%.   Neurologic Examination:                                                                                                       Mental Status: Alert, oriented, thought content appropriate.  Speech fluent without  evidence of aphasia.  Able to follow 3 step commands without difficulty. Cranial Nerves: II: Discs flat bilaterally; Visual fields grossly normal, pupils equal, round, reactive to light and accommodation III,IV, VI: ptosis not present, extra-ocular motions intact bilaterally V,VII: smile symmetric, facial light touch sensation normal bilaterally VIII: hearing normal bilaterally IX,X: gag reflex present XI: bilateral shoulder shrug XII: midline tongue extension Motor: Right : Upper extremity   5/5    Left:     Upper extremity   5/5  Lower extremity   5/5     Lower extremity   5/5 Tone and bulk:normal tone throughout; no atrophy noted Sensory: Pinprick and light touch intact throughout, bilaterally with decreased sensation up to mid shin (known peripheral neuropathy) Deep Tendon Reflexes: 2+ and symmetric throughout UE, 1+ KJ, no ankle jerk Plantars: equivical Cerebellar: normal finger-to-nose,  normal heel-to-shin test CV: pulses palpable throughout    Lab Results  Component Value Date/Time   CHOL 148 09/14/2010 12:00 AM    Results for orders placed during the hospital encounter of 10/07/12 (from the past 48 hour(s))  CBC     Status: Abnormal   Collection Time   10/07/12 10:57 AM      Component Value Range Comment   WBC 6.5  4.0 - 10.5 K/uL    RBC 4.04  3.87 - 5.11 MIL/uL    Hemoglobin 10.9 (*) 12.0 - 15.0 g/dL    HCT 91.4 (*) 78.2 - 46.0 %    MCV 86.4  78.0 - 100.0 fL    MCH 27.0  26.0 - 34.0 pg    MCHC 31.2  30.0 - 36.0 g/dL    RDW 95.6 (*) 21.3 - 15.5 %    Platelets 130 (*) 150 - 400 K/uL   COMPREHENSIVE METABOLIC PANEL     Status: Abnormal   Collection Time   10/07/12 10:57 AM      Component Value Range Comment   Sodium 142  135 - 145 mEq/L    Potassium 4.3  3.5 - 5.1 mEq/L    Chloride 105  96 - 112 mEq/L    CO2 30  19 - 32 mEq/L    Glucose, Bld 240 (*) 70 - 99 mg/dL    BUN 9  6 - 23 mg/dL    Creatinine, Ser 0.86  0.50 - 1.10 mg/dL    Calcium 9.5  8.4 -  10.5 mg/dL     Total Protein 7.3  6.0 - 8.3 g/dL    Albumin 3.3 (*) 3.5 - 5.2 g/dL    AST 56 (*) 0 - 37 U/L    ALT 40 (*) 0 - 35 U/L    Alkaline Phosphatase 161 (*) 39 - 117 U/L    Total Bilirubin 0.4  0.3 - 1.2 mg/dL    GFR calc non Af Amer >90  >90 mL/min    GFR calc Af Amer >90  >90 mL/min   URINALYSIS, ROUTINE W REFLEX MICROSCOPIC     Status: Abnormal   Collection Time   10/07/12 12:23 PM      Component Value Range Comment   Color, Urine YELLOW  YELLOW    APPearance CLEAR  CLEAR    Specific Gravity, Urine 1.016  1.005 - 1.030    pH 5.0  5.0 - 8.0    Glucose, UA NEGATIVE  NEGATIVE mg/dL    Hgb urine dipstick NEGATIVE  NEGATIVE    Bilirubin Urine NEGATIVE  NEGATIVE    Ketones, ur NEGATIVE  NEGATIVE mg/dL    Protein, ur NEGATIVE  NEGATIVE mg/dL    Urobilinogen, UA 1.0  0.0 - 1.0 mg/dL    Nitrite NEGATIVE  NEGATIVE    Leukocytes, UA SMALL (*) NEGATIVE   URINE MICROSCOPIC-ADD ON     Status: Abnormal   Collection Time   10/07/12 12:23 PM      Component Value Range Comment   Squamous Epithelial / LPF FEW (*) RARE    WBC, UA 3-6  <3 WBC/hpf    RBC / HPF 0-2  <3 RBC/hpf    Bacteria, UA RARE  RARE   POCT I-STAT 3, BLOOD GAS (G3+)     Status: Abnormal   Collection Time   10/07/12  3:08 PM      Component Value Range Comment   pH, Arterial 7.363  7.350 - 7.450    pCO2 arterial 52.2 (*) 35.0 - 45.0 mmHg    pO2, Arterial 78.0 (*) 80.0 - 100.0 mmHg    Bicarbonate 29.7 (*) 20.0 - 24.0 mEq/L    TCO2 31  0 - 100 mmol/L    O2 Saturation 95.0      Acid-Base Excess 3.0 (*) 0.0 - 2.0 mmol/L    Collection site RADIAL, ALLEN'S TEST ACCEPTABLE      Drawn by Operator      Sample type ARTERIAL       Ct Head Wo Contrast  10/07/2012  *RADIOLOGY REPORT*  Clinical Data: Mental status changes.  Somnolence.  CT HEAD WITHOUT CONTRAST  Technique:  Contiguous axial images were obtained from the base of the skull through the vertex without contrast.  Comparison: None.  Findings: The the brain has a normal appearance  without evidence of malformation, atrophy, old or acute infarction, mass lesion, hemorrhage, hydrocephalus or extra-axial collection.  The calvarium is unremarkable.  Sinuses, middle ears and mastoids are clear.  IMPRESSION: Normal head CT   Original Report Authenticated By: Paulina Fusi, M.D.     Felicie Morn PA-C [facility] [phone]  10/07/2012, 3:29 PM  Patient seen and examined.  Clinical course and management discussed.  Necessary edits performed.  I agree with the above.  Assessment and plan of care developed and discussed below.    Assessment: 61 year old female presenting with a progressive decline in function over the past 2 years.  Memory has been affected.  Patient has developed staring spells and episodes of poor  awareness.  Unclear etiology.  Neurological exam is nonfocal.  Head CT is unremarkable.  Patient being admitted for in-patient work up.    Plan: 1. MRI of the brain without contrast 2. EEG 3. TSH, RPR, ESR, B12, folate, Vitamin D   Thana Farr, MD [facility] [phone]  10/07/2012  5:31 PM

## 2012-10-07 NOTE — Progress Notes (Signed)
Utilization review completed.  P.J. Nicklous Aburto,RN,BSN Case Manager [phone]  

## 2012-10-07 NOTE — ED Provider Notes (Addendum)
History     CSN: [id number]  Arrival date & time 10/07/12  4696   First MD Initiated Contact with Patient 10/07/12 1010      Chief Complaint  Patient presents with  . Loss of Consciousness    (Consider location/radiation/quality/duration/timing/severity/associated sxs/prior treatment) Patient is a 61 y.o. female presenting with syncope. The history is provided by the patient.  Loss of Consciousness Pertinent negatives include no chest pain, no abdominal pain, no headaches and no shortness of breath.  pt from home w transient loss of consciousness/unresponsiveness this morning at home.  Unclear from history how long lasted. ems arrived, gave narcan.  Pt was awake and alert by then, ?whether slightly more alert post narcan.  Pt had taken a dose of 10 mg hydromorphone early this morning. No injury or trauma associated w unresponsive episode.  Family states for past 1-2 years (or longer) that they have noticed progressive mental status changes. They state that patient will drift off to sleep mid sentence or while doing something on computer. Often times will stay up through night, and then appear excessively tired during the day. States at baseline very bright/smart, but in past few years less so, with periods of confusion, disorientation, and poor recall of recent events and conversations. Family also states perspires almost continuously, day and night. Hx thyroid disease. Has been compliant w meds. Family denies any hx of or tendency to overdose on meds or take higher than the prescribed dose. No recent change in meds, did take one of her husbands oxymorphone last pm for left shoulder pain. Has had problems w shoulder pain/injury since fall approximately 1-2 months ago. No recent head injury or loc. Hx breast ca, surgery for same earlier in year, deny any hx recurrent or metastatic disease. No hx cva. No numbness/weakness. No change in speech or vision. No fever or chills.   Past Medical History   Diagnosis Date  . Cirrhosis   . Multiple thyroid nodules   . History of stomach ulcers   . Hernia   . Colon polyp   . Neuropathy of foot   . Restless legs   . Complication of anesthesia     WITH SECON HERNIA REPAIR 2012 [facility]  DIFFICULTY O2 SAT DROPPING ADMIT TO HOSPITAL   . Bundle branch block   . Hypertension   . Angina     COSTOCONDRITIS    . Shortness of breath     WITH EXERTION   . Diabetes mellitus   . Hepatitis      Did Not have hepatitis but needed Hep A and Hep B injections2012 SPOTS ON LIVER  DR Marcelene [place]  [phone]  . GERD (gastroesophageal reflux disease)     ULCERS  . Hyperthyroidism     NODULES ON THYROID  (DR BALEN 37  12-609)  . Goiter   . Headache     MIGRAINES  . Neuromuscular disorder     PERIPH NEUROPATHY   . Cancer     RIGHT BREAST  . Anxiety   . Depression   . Breast cancer 01/19/12     right breast lumpectomy/ER/PR positibe,her-2 neg.  . Allergy     tape  . S/P radiation therapy 02/19/12 - 04/01/12    Right Breast: 5000 cgy/25 Fractions with Boost/ 1000 cGy/5 Fractions    Past Surgical History  Procedure Date  . Appendectomy   . Tonsillectomy   . Cesarean section   . Tubal ligation   . Abdominal hysterectomy   . Back  surgery   . Liver biopsy   . Hernia repair     X 2  . Anal fissure repair   . Carpal tunnel release     LEFT   . Colonoscopy w/ polypectomy   . Breast surgery     BIOPSY  Right Breast -  Sentinel Lymph Nodes  . Back surgery   . Liver biopsy 03/28/11    [facility] - Cirrhosis  . Chronic hepatitis   . Thyroid nodules   . Spinal fusion     Family History  Problem Relation Age of Onset  . Cancer Maternal Grandfather     Lung Cancer  . Cancer Paternal Grandfather     Colon Cancer    History  Substance Use Topics  . Smoking status: Never Smoker   . Smokeless tobacco: Not on file  . Alcohol Use: No    OB History    Grav Para Term Preterm Abortions TAB SAB Ect Mult Living   2 2              Obstetric Comments    Menarch age15G2, 8, parity age 63, Use of HRT short period of time      Review of Systems  Constitutional: Negative for fever and chills.  HENT: Negative for neck pain.   Eyes: Negative for visual disturbance.  Respiratory: Negative for cough and shortness of breath.   Cardiovascular: Positive for syncope. Negative for chest pain.  Gastrointestinal: Negative for vomiting, abdominal pain and diarrhea.  Genitourinary: Negative for flank pain.  Musculoskeletal: Negative for back pain.  Skin: Negative for rash.  Neurological: Negative for headaches.  Hematological: Does not bruise/bleed easily.  Psychiatric/Behavioral: Positive for confusion.    Allergies  Tape and Oxymorphone  Home Medications   Current Outpatient Rx  Name  Route  Sig  Dispense  Refill  . ANASTROZOLE 1 MG PO TABS   Oral   Take 1 mg by mouth daily.         . ASPIRIN 81 MG PO TABS   Oral   Take 81 mg by mouth daily.         . SUPER B COMPLEX PO   Oral   Take 1 tablet by mouth daily.         . BD PEN NEEDLE MINI U/F 31G X 5 MM MISC               . CALCIUM CARBONATE-VITAMIN D 500-200 MG-UNIT PO TABS   Oral   Take 1 tablet by mouth daily.         Marland Kitchen CETIRIZINE HCL 10 MG PO TABS   Oral   Take 10 mg by mouth daily.         . CHOLECALCIFEROL 1000 UNITS PO CAPS   Oral   Take 2,000 Units by mouth daily.         Marland Kitchen CINNAMON PO   Oral   Take 2 tablets by mouth daily.         . DULOXETINE HCL 60 MG PO CPEP   Oral   Take 60 mg by mouth daily.         Marland Kitchen GABAPENTIN 800 MG PO TABS      800 mg 3 (three) times daily.          Marland Kitchen GREEN TEA EXTRACT PO   Oral   Take 1 tablet by mouth daily. 400mg          . INSULIN LISPRO PROT &  LISPRO (75-25) 100 UNIT/ML [place] SUSP   Subcutaneous   Inject 60-70 Units into the skin 2 (two) times daily with a meal. 70 units in the morning and 60 units in the evening         . LISINOPRIL 20 MG PO TABS   Oral   Take 20 mg by  mouth every morning. Take in the morning         . LORAZEPAM 2 MG PO TABS   Oral   Take 2 mg by mouth every 6 (six) hours as needed. anxiety         . MAGNESIUM 400 MG PO CAPS   Oral   Take 1 capsule by mouth daily.         Marland Kitchen MECLIZINE HCL 25 MG PO TABS   Oral   Take 12.5 mg by mouth 3 (three) times daily as needed. vertigo         . METFORMIN HCL ER 500 MG PO TB24   Oral   Take 1,000 mg by mouth 2 (two) times daily.          . MULTIPLE VITAMIN PO   Oral   Take 1 tablet by mouth daily.         . EQL OMEGA 3 FISH OIL 1400 MG PO CAPS   Oral   Take 1 capsule by mouth daily.         Marland Kitchen OMEPRAZOLE 40 MG PO CPDR   Oral   Take 40 mg by mouth daily.         . ONETOUCH ULTRA BLUE VI STRP               . ONETOUCH DELICA LANCETS MISC               . OVER THE COUNTER MEDICATION   Oral   Take 1 tablet by mouth at bedtime as needed. "Prunelax"         . OVER THE COUNTER MEDICATION   Oral   Take 1 tablet by mouth daily. "Grape seed and resveratrol"         . PRAMIPEXOLE DIHYDROCHLORIDE 1 MG PO TABS   Oral   Take 1 mg by mouth at bedtime.         Marland Kitchen PRAVASTATIN SODIUM 40 MG PO TABS   Oral   Take 40 mg by mouth daily.          Marland Kitchen PROPRANOLOL HCL ER 60 MG PO CP24   Oral   Take 60 mg by mouth daily. Take at night         . VITAMIN B-12 1000 MCG PO TABS   Oral   Take 1,000 mcg by mouth daily.           BP 153/83  Pulse 94  Temp 99 F (37.2 C) (Oral)  Resp 15  SpO2 94%  Physical Exam  Nursing note and vitals reviewed. Constitutional: She is oriented to person, place, and time. She appears well-developed and well-nourished. No distress.  HENT:  Mouth/Throat: Oropharynx is clear and moist.  Eyes: Conjunctivae normal are normal. Pupils are equal, round, and reactive to light. No scleral icterus.  Neck: Normal range of motion. Neck supple. No tracheal deviation present. No thyromegaly present.  Cardiovascular: Normal rate, regular  rhythm, normal heart sounds and intact distal pulses.   Pulmonary/Chest: Effort normal and breath sounds normal. No respiratory distress.  Abdominal: Soft. Normal appearance and bowel sounds are normal. She exhibits no  distension.  Genitourinary:       No cva tenderness  Musculoskeletal: She exhibits no edema and no tenderness.  Neurological: She is alert and oriented to person, place, and time.       Motor intact bil.  Pt awake and alert, eyes open. Drifts off easily to sleep mid conversation, arouses very easily, alert, drifts off to sleep again.   Skin: Skin is warm. No rash noted. She is diaphoretic.  Psychiatric: She has a normal mood and affect.    ED Course  Procedures (including critical care time)   Labs Reviewed  CBC  COMPREHENSIVE METABOLIC PANEL  URINALYSIS, ROUTINE W REFLEX MICROSCOPIC  TSH  T4, FREE   Results for orders placed during the hospital encounter of 10/07/12  CBC      Component Value Range   WBC 6.5  4.0 - 10.5 K/uL   RBC 4.04  3.87 - 5.11 MIL/uL   Hemoglobin 10.9 (*) 12.0 - 15.0 g/dL   HCT 04.5 (*) 40.9 - 81.1 %   MCV 86.4  78.0 - 100.0 fL   MCH 27.0  26.0 - 34.0 pg   MCHC 31.2  30.0 - 36.0 g/dL   RDW 91.4 (*) 78.2 - 95.6 %   Platelets 130 (*) 150 - 400 K/uL  COMPREHENSIVE METABOLIC PANEL      Component Value Range   Sodium 142  135 - 145 mEq/L   Potassium 4.3  3.5 - 5.1 mEq/L   Chloride 105  96 - 112 mEq/L   CO2 30  19 - 32 mEq/L   Glucose, Bld 240 (*) 70 - 99 mg/dL   BUN 9  6 - 23 mg/dL   Creatinine, Ser 2.13  0.50 - 1.10 mg/dL   Calcium 9.5  8.4 - 08.6 mg/dL   Total Protein 7.3  6.0 - 8.3 g/dL   Albumin 3.3 (*) 3.5 - 5.2 g/dL   AST 56 (*) 0 - 37 U/L   ALT 40 (*) 0 - 35 U/L   Alkaline Phosphatase 161 (*) 39 - 117 U/L   Total Bilirubin 0.4  0.3 - 1.2 mg/dL   GFR calc non Af Amer >90  >90 mL/min   GFR calc Af Amer >90  >90 mL/min  URINALYSIS, ROUTINE W REFLEX MICROSCOPIC      Component Value Range   Color, Urine YELLOW  YELLOW    APPearance CLEAR  CLEAR   Specific Gravity, Urine 1.016  1.005 - 1.030   pH 5.0  5.0 - 8.0   Glucose, UA NEGATIVE  NEGATIVE mg/dL   Hgb urine dipstick NEGATIVE  NEGATIVE   Bilirubin Urine NEGATIVE  NEGATIVE   Ketones, ur NEGATIVE  NEGATIVE mg/dL   Protein, ur NEGATIVE  NEGATIVE mg/dL   Urobilinogen, UA 1.0  0.0 - 1.0 mg/dL   Nitrite NEGATIVE  NEGATIVE   Leukocytes, UA SMALL (*) NEGATIVE  URINE MICROSCOPIC-ADD ON      Component Value Range   Squamous Epithelial / LPF FEW (*) RARE   WBC, UA 3-6  <3 WBC/hpf   RBC / HPF 0-2  <3 RBC/hpf   Bacteria, UA RARE  RARE   Ct Head Wo Contrast  10/07/2012  *RADIOLOGY REPORT*  Clinical Data: Mental status changes.  Somnolence.  CT HEAD WITHOUT CONTRAST  Technique:  Contiguous axial images were obtained from the base of the skull through the vertex without contrast.  Comparison: None.  Findings: The the brain has a normal appearance without evidence of malformation, atrophy, old or  acute infarction, mass lesion, hemorrhage, hydrocephalus or extra-axial collection.  The calvarium is unremarkable.  Sinuses, middle ears and mastoids are clear.  IMPRESSION: Normal head CT   Original Report Authenticated By: Paulina Fusi, M.D.        MDM  Labs. Vitals.  Reviewed nursing notes and prior charts for additional history.     Date: 10/07/2012  Rate: 84  Rhythm: normal sinus rhythm  QRS Axis: normal  Intervals: normal  ST/T Wave abnormalities: normal  Conduction Disutrbances:none  Narrative Interpretation:   Old EKG Reviewed: unchanged   Given unresponsive period/?syncope early today and persistent altered mental status/lethargy/tiredness, will admit for observation, further workup.  Transitioned to cdu awaiting completion labs/workup.  Discussed w CDU provider Paz.  [facility], tele, obs.       Suzi Roots, MD 10/07/12 1259  Suzi Roots, MD 10/07/12 1320

## 2012-10-07 NOTE — H&P (Signed)
[facility]          History and Physical    PCP:   Luvenia Redden, MD   Chief Complaint:  Episode of LOC  HPI: 61 y/o woman brought to the hospital by her family after a syncopal event earlier today. It appears she was working on the computer, her husband noticed her head had slumped forward. He was unable to arouse her. After about 5 minutes she "woke up" but seem confused. Throughout my exam patient has been extremely lethargic and arouses briefly to painful stimuli. She is unable to provide any history. Family states that for a few months she has been falling asleep easily, but that she is mainly functional. Has been under a lot of stress with her husband's illness. She has frequent episodes of staring and fixed gaze for a few seconds where she is unresponsive and then wonders what was said. She has a h/o breast cancer and hypothyroidism. Yesterday she took one of her husband's pain pills for shoulder pain. Was given narcan by EMS without response. We have been asked to admit her for further evaluation and management.  Allergies:   Allergies  Allergen Reactions  . Tape   . Oxymorphone     lathargic      Past Medical History  Diagnosis Date  . Cirrhosis   . Multiple thyroid nodules   . History of stomach ulcers   . Hernia   . Colon polyp   . Neuropathy of foot   . Restless legs   . Complication of anesthesia     WITH SECON HERNIA REPAIR 2012 [facility]  DIFFICULTY O2 SAT DROPPING ADMIT TO HOSPITAL   . Bundle branch block   . Hypertension   . Angina     COSTOCONDRITIS    . Shortness of breath     WITH EXERTION   . Diabetes mellitus   . Hepatitis      Did Not have hepatitis but needed Hep A and Hep B injections2012 SPOTS ON LIVER  DR Marcelene Butte  [phone]  . GERD (gastroesophageal reflux disease)     ULCERS  . Hyperthyroidism     NODULES ON THYROID  (DR BALEN 37  12-609)  . Goiter   . Headache     MIGRAINES  . Neuromuscular disorder     PERIPH  NEUROPATHY   . Cancer     RIGHT BREAST  . Anxiety   . Depression   . Breast cancer 01/19/12     right breast lumpectomy/ER/PR positibe,her-2 neg.  . Allergy     tape  . S/P radiation therapy 02/19/12 - 04/01/12    Right Breast: 5000 cgy/25 Fractions with Boost/ 1000 cGy/5 Fractions    Past Surgical History  Procedure Date  . Appendectomy   . Tonsillectomy   . Cesarean section   . Tubal ligation   . Abdominal hysterectomy   . Back surgery   . Liver biopsy   . Hernia repair     X 2  . Anal fissure repair   . Carpal tunnel release     LEFT   . Colonoscopy w/ polypectomy   . Breast surgery     BIOPSY  Right Breast -  Sentinel Lymph Nodes  . Back surgery   . Liver biopsy 03/28/11    [facility] - Cirrhosis  . Chronic hepatitis   . Thyroid nodules   . Spinal fusion     Prior to Admission medications   Medication Sig Start  Date End Date Taking? Authorizing Provider  anastrozole (ARIMIDEX) 1 MG tablet Take 1 mg by mouth daily.   Yes Historical Provider, MD  aspirin 81 MG tablet Take 81 mg by mouth daily.   Yes Historical Provider, MD  B Complex-C (SUPER B COMPLEX PO) Take 1 tablet by mouth daily.   Yes Historical Provider, MD  B-D UF III MINI PEN NEEDLES 31G X 5 MM MISC  02/05/12  Yes Historical Provider, MD  calcium-vitamin D (OSCAL WITH D) 500-200 MG-UNIT per tablet Take 1 tablet by mouth daily.   Yes Historical Provider, MD  cetirizine (ZYRTEC) 10 MG tablet Take 10 mg by mouth daily.   Yes Historical Provider, MD  Cholecalciferol 1000 UNITS capsule Take 2,000 Units by mouth daily.   Yes Historical Provider, MD  CINNAMON PO Take 2 tablets by mouth daily.   Yes Historical Provider, MD  DULoxetine (CYMBALTA) 60 MG capsule Take 60 mg by mouth daily.   Yes Historical Provider, MD  gabapentin (NEURONTIN) 800 MG tablet 800 mg 3 (three) times daily.  12/21/11  Yes Historical Provider, MD  Janett Billow, Camillia sinensis, (GREEN TEA EXTRACT PO) Take 1 tablet by mouth daily. 400mg     Yes Historical Provider, MD  insulin lispro protamine-insulin lispro (HUMALOG 75/25) (75-25) 100 UNIT/ML SUSP Inject 60-70 Units into the skin 2 (two) times daily with a meal. 70 units in the morning and 60 units in the evening   Yes Historical Provider, MD  lisinopril (PRINIVIL,ZESTRIL) 20 MG tablet Take 20 mg by mouth every morning. Take in the morning 11/24/11  Yes Historical Provider, MD  LORazepam (ATIVAN) 2 MG tablet Take 2 mg by mouth every 6 (six) hours as needed. anxiety   Yes Historical Provider, MD  Magnesium 400 MG CAPS Take 1 capsule by mouth daily.   Yes Historical Provider, MD  meclizine (ANTIVERT) 25 MG tablet Take 12.5 mg by mouth 3 (three) times daily as needed. vertigo   Yes Historical Provider, MD  metFORMIN (GLUCOPHAGE-XR) 500 MG 24 hr tablet Take 1,000 mg by mouth 2 (two) times daily.  11/09/11  Yes Historical Provider, MD  MULTIPLE VITAMIN PO Take 1 tablet by mouth daily.   Yes Historical Provider, MD  Omega-3 Fatty Acids (EQL OMEGA 3 FISH OIL) 1400 MG CAPS Take 1 capsule by mouth daily.   Yes Historical Provider, MD  omeprazole (PRILOSEC) 40 MG capsule Take 40 mg by mouth daily.   Yes Historical Provider, MD  ONE TOUCH ULTRA TEST test strip  02/07/12  Yes Historical Provider, MD  Dola Argyle LANCETS MISC  02/07/12  Yes Historical Provider, MD  OVER THE COUNTER MEDICATION Take 1 tablet by mouth at bedtime as needed. "Prunelax"   Yes Historical Provider, MD  OVER THE COUNTER MEDICATION Take 1 tablet by mouth daily. "Grape seed and resveratrol"   Yes Historical Provider, MD  pramipexole (MIRAPEX) 1 MG tablet Take 1 mg by mouth at bedtime.   Yes Historical Provider, MD  pravastatin (PRAVACHOL) 40 MG tablet Take 40 mg by mouth daily.  12/04/11  Yes Historical Provider, MD  propranolol (INDERAL LA) 60 MG 24 hr capsule Take 60 mg by mouth daily. Take at night 11/23/11  Yes Historical Provider, MD  vitamin B-12 (CYANOCOBALAMIN) 1000 MCG tablet Take 1,000 mcg by mouth daily.   Yes  Historical Provider, MD    Social History:  reports that she has never smoked. She does not have any smokeless tobacco history on file. She reports that she does not  drink alcohol or use illicit drugs.  Family History  Problem Relation Age of Onset  . Cancer Maternal Grandfather     Lung Cancer  . Cancer Paternal Grandfather     Colon Cancer    Review of Systems:  Unable to obtain.  Physical Exam: Blood pressure 139/81, pulse 79, temperature 98.8 F (37.1 C), temperature source Oral, resp. rate 23, SpO2 98.00%. Gen: lethargic, will arouse only briefly to painful stimuli, can answer a few simple questions and falls quickly back to sleep. HEENT: [place]/AT/PERRL. Neck: supple, no JVD, ++goiter CV: RRR, no M/R/G Lungs: CTA B Abd: NT/ND/+BS Ext: no C/C/E Neuro: difficult to examine because of lethargy.  Labs on Admission:  Results for orders placed during the hospital encounter of 10/07/12 (from the past 48 hour(s))  CBC     Status: Abnormal   Collection Time   10/07/12 10:57 AM      Component Value Range Comment   WBC 6.5  4.0 - 10.5 K/uL    RBC 4.04  3.87 - 5.11 MIL/uL    Hemoglobin 10.9 (*) 12.0 - 15.0 g/dL    HCT 45.4 (*) 09.8 - 46.0 %    MCV 86.4  78.0 - 100.0 fL    MCH 27.0  26.0 - 34.0 pg    MCHC 31.2  30.0 - 36.0 g/dL    RDW 11.9 (*) 14.7 - 15.5 %    Platelets 130 (*) 150 - 400 K/uL   COMPREHENSIVE METABOLIC PANEL     Status: Abnormal   Collection Time   10/07/12 10:57 AM      Component Value Range Comment   Sodium 142  135 - 145 mEq/L    Potassium 4.3  3.5 - 5.1 mEq/L    Chloride 105  96 - 112 mEq/L    CO2 30  19 - 32 mEq/L    Glucose, Bld 240 (*) 70 - 99 mg/dL    BUN 9  6 - 23 mg/dL    Creatinine, Ser 8.29  0.50 - 1.10 mg/dL    Calcium 9.5  8.4 - 56.2 mg/dL    Total Protein 7.3  6.0 - 8.3 g/dL    Albumin 3.3 (*) 3.5 - 5.2 g/dL    AST 56 (*) 0 - 37 U/L    ALT 40 (*) 0 - 35 U/L    Alkaline Phosphatase 161 (*) 39 - 117 U/L    Total Bilirubin 0.4  0.3 - 1.2  mg/dL    GFR calc non Af Amer >90  >90 mL/min    GFR calc Af Amer >90  >90 mL/min   URINALYSIS, ROUTINE W REFLEX MICROSCOPIC     Status: Abnormal   Collection Time   10/07/12 12:23 PM      Component Value Range Comment   Color, Urine YELLOW  YELLOW    APPearance CLEAR  CLEAR    Specific Gravity, Urine 1.016  1.005 - 1.030    pH 5.0  5.0 - 8.0    Glucose, UA NEGATIVE  NEGATIVE mg/dL    Hgb urine dipstick NEGATIVE  NEGATIVE    Bilirubin Urine NEGATIVE  NEGATIVE    Ketones, ur NEGATIVE  NEGATIVE mg/dL    Protein, ur NEGATIVE  NEGATIVE mg/dL    Urobilinogen, UA 1.0  0.0 - 1.0 mg/dL    Nitrite NEGATIVE  NEGATIVE    Leukocytes, UA SMALL (*) NEGATIVE   URINE MICROSCOPIC-ADD ON     Status: Abnormal   Collection Time   10/07/12  12:23 PM      Component Value Range Comment   Squamous Epithelial / LPF FEW (*) RARE    WBC, UA 3-6  <3 WBC/hpf    RBC / HPF 0-2  <3 RBC/hpf    Bacteria, UA RARE  RARE   POCT I-STAT 3, BLOOD GAS (G3+)     Status: Abnormal   Collection Time   10/07/12  3:08 PM      Component Value Range Comment   pH, Arterial 7.363  7.350 - 7.450    pCO2 arterial 52.2 (*) 35.0 - 45.0 mmHg    pO2, Arterial 78.0 (*) 80.0 - 100.0 mmHg    Bicarbonate 29.7 (*) 20.0 - 24.0 mEq/L    TCO2 31  0 - 100 mmol/L    O2 Saturation 95.0      Acid-Base Excess 3.0 (*) 0.0 - 2.0 mmol/L    Collection site RADIAL, ALLEN'S TEST ACCEPTABLE      Drawn by Operator      Sample type ARTERIAL       Radiological Exams on Admission: Ct Head Wo Contrast  10/07/2012  *RADIOLOGY REPORT*  Clinical Data: Mental status changes.  Somnolence.  CT HEAD WITHOUT CONTRAST  Technique:  Contiguous axial images were obtained from the base of the skull through the vertex without contrast.  Comparison: None.  Findings: The the brain has a normal appearance without evidence of malformation, atrophy, old or acute infarction, mass lesion, hemorrhage, hydrocephalus or extra-axial collection.  The calvarium is unremarkable.   Sinuses, middle ears and mastoids are clear.  IMPRESSION: Normal head CT   Original Report Authenticated By: Paulina Fusi, M.D.     Assessment/Plan Principal Problem:  *Acute encephalopathy Active Problems:  Syncope  DIABETES MELLITUS, TYPE II, UNCONTROLLED  HYPERLIPIDEMIA, MIXED, WITH HIGH HDL  Essential hypertension, benign  Breast cancer   Acute Encephalopathy -Broad DDx at this point. -CT Head unremarkable. -With h/o breast cancer, will check MRI Brain, to make sure no mets or CVA (doubt CVA). -Mostly concerned for focal seizures with her episodes of staring off (EEG ordered and neurology has been consulted. -With her goiter she may also have severe hypothyroidism (TSH, T4,T3) -Will check ammonia. She does have mild LFT abnormalities. If elevated lactulose may help. -STAT ABG: 7.36/52/78/29. Probably has some chronic resp acidosis given some compensation. -Will give another dose of narcan as she did take a strong narcotic yesterday altho she had no true response to the first dose. -Will also rule out infectious causes: UA is ok, check CXR and blood cultures. -Will place in SDU for now. -Given her somnolence will keep NPO to avoid aspiration. Will likely benefit from an ST eval as family says that her pills get "stuck" frequently.  DM II -Check A1C. -Place on sensitive SSI.  HTN/HLD -Hold meds given NPO status.  DVT Prophylaxis -Lovenox.   Time Spent on Admission: 90 minutes  HERNANDEZ ACOSTA,ESTELA [facility] Pager: [phone] 10/07/2012, 3:27 PM

## 2012-10-07 NOTE — ED Notes (Signed)
Staff was not notified by family or patient that the patient needed to urinate. Family assisted patient.

## 2012-10-08 ENCOUNTER — Inpatient Hospital Stay (HOSPITAL_COMMUNITY): Payer: BC Managed Care – PPO

## 2012-10-08 DIAGNOSIS — F5089 Other specified eating disorder: Secondary | ICD-10-CM | POA: Diagnosis present

## 2012-10-08 DIAGNOSIS — D649 Anemia, unspecified: Secondary | ICD-10-CM | POA: Diagnosis present

## 2012-10-08 DIAGNOSIS — G4733 Obstructive sleep apnea (adult) (pediatric): Secondary | ICD-10-CM | POA: Diagnosis present

## 2012-10-08 DIAGNOSIS — K7460 Unspecified cirrhosis of liver: Secondary | ICD-10-CM | POA: Diagnosis present

## 2012-10-08 LAB — IRON AND TIBC
Iron: 64 ug/dL (ref 42–135)
Saturation Ratios: 18 % — ABNORMAL LOW (ref 20–55)
TIBC: 353 ug/dL (ref 250–470)
UIBC: 289 ug/dL (ref 125–400)

## 2012-10-08 LAB — CBC
MCHC: 32.5 g/dL (ref 30.0–36.0)
Platelets: 90 10*3/uL — ABNORMAL LOW (ref 150–400)
RDW: 15.3 % (ref 11.5–15.5)

## 2012-10-08 LAB — FOLATE: Folate: >20 ng/mL

## 2012-10-08 LAB — COMPREHENSIVE METABOLIC PANEL
ALT: 32 U/L (ref 0–35)
Alkaline Phosphatase: 131 U/L — ABNORMAL HIGH (ref 39–117)
CO2: 29 mEq/L (ref 19–32)
Calcium: 8.9 mg/dL (ref 8.4–10.5)
GFR calc Af Amer: >90 mL/min (ref >90)
GFR calc non Af Amer: >90 mL/min (ref >90)
Glucose, Bld: 179 mg/dL — ABNORMAL HIGH (ref 70–99)
Sodium: 140 mEq/L (ref 135–145)

## 2012-10-08 LAB — VITAMIN B12
Vitamin B-12: 1769 pg/mL — ABNORMAL HIGH (ref 211–911)
Vitamin B-12: 1465 pg/mL — ABNORMAL HIGH (ref 211–911)

## 2012-10-08 LAB — RPR: RPR Ser Ql: NONREACTIVE

## 2012-10-08 LAB — GLUCOSE, CAPILLARY
Glucose-Capillary: 263 mg/dL — ABNORMAL HIGH (ref 70–99)
Glucose-Capillary: 176 mg/dL — ABNORMAL HIGH (ref 70–99)

## 2012-10-08 LAB — RETICULOCYTES: RBC.: 3.55 MIL/uL — ABNORMAL LOW (ref 3.87–5.11)

## 2012-10-08 MED ORDER — MAGNESIUM OXIDE 400 (241.3 MG) MG PO TABS
400.0000 mg | ORAL_TABLET | Freq: Every day | ORAL | Status: DC
  Administered 2012-10-08 – 2012-10-10 (×3): 400 mg via ORAL
  Filled 2012-10-08 (×3): qty 1

## 2012-10-08 MED ORDER — DULOXETINE HCL 60 MG PO CPEP
60.0000 mg | ORAL_CAPSULE | Freq: Every day | ORAL | Status: DC
  Administered 2012-10-08 – 2012-10-10 (×3): 60 mg via ORAL
  Filled 2012-10-08 (×3): qty 1

## 2012-10-08 MED ORDER — PROPRANOLOL HCL ER 60 MG PO CP24
60.0000 mg | ORAL_CAPSULE | Freq: Every day | ORAL | Status: DC
  Administered 2012-10-08 – 2012-10-10 (×3): 60 mg via ORAL
  Filled 2012-10-08 (×3): qty 1

## 2012-10-08 MED ORDER — PRAMIPEXOLE DIHYDROCHLORIDE 1 MG PO TABS
1.0000 mg | ORAL_TABLET | Freq: Every day | ORAL | Status: DC
  Administered 2012-10-08 – 2012-10-09 (×2): 1 mg via ORAL
  Filled 2012-10-08 (×3): qty 1

## 2012-10-08 MED ORDER — PANTOPRAZOLE SODIUM 40 MG PO TBEC: 40.0000 mg | DELAYED_RELEASE_TABLET | Freq: Every day | ORAL | Status: DC

## 2012-10-08 MED ORDER — ANASTROZOLE 1 MG PO TABS
1.0000 mg | ORAL_TABLET | Freq: Every day | ORAL | Status: DC
  Administered 2012-10-08 – 2012-10-10 (×3): 1 mg via ORAL
  Filled 2012-10-08 (×3): qty 1

## 2012-10-08 MED ORDER — GABAPENTIN 800 MG PO TABS
800.0000 mg | ORAL_TABLET | Freq: Three times a day (TID) | ORAL | Status: DC
  Administered 2012-10-08: 800 mg via ORAL
  Filled 2012-10-08 (×5): qty 1

## 2012-10-08 MED ORDER — MAGNESIUM 400 MG PO CAPS: 1.0000 | ORAL_CAPSULE | Freq: Every day | ORAL | Status: DC

## 2012-10-08 MED ORDER — PANTOPRAZOLE SODIUM 40 MG PO TBEC
80.0000 mg | DELAYED_RELEASE_TABLET | Freq: Every day | ORAL | Status: DC
  Administered 2012-10-08 – 2012-10-10 (×3): 80 mg via ORAL
  Filled 2012-10-08 (×2): qty 1
  Filled 2012-10-08: qty 2
  Filled 2012-10-08 (×2): qty 1

## 2012-10-08 MED ORDER — INSULIN LISPRO PROT & LISPRO (75-25 MIX) 100 UNIT/ML ~~LOC~~ SUSP
70.0000 [IU] | Freq: Every day | SUBCUTANEOUS | Status: DC
  Administered 2012-10-09 – 2012-10-10 (×2): 70 [IU] via SUBCUTANEOUS

## 2012-10-08 MED ORDER — GABAPENTIN 400 MG PO CAPS
800.0000 mg | ORAL_CAPSULE | Freq: Three times a day (TID) | ORAL | Status: DC
  Administered 2012-10-08 – 2012-10-10 (×5): 800 mg via ORAL
  Filled 2012-10-08 (×7): qty 2

## 2012-10-08 MED ORDER — INSULIN LISPRO PROT & LISPRO (75-25 MIX) 100 UNIT/ML ~~LOC~~ SUSP
60.0000 [IU] | Freq: Every day | SUBCUTANEOUS | Status: DC
  Administered 2012-10-08 – 2012-10-09 (×2): 60 [IU] via SUBCUTANEOUS

## 2012-10-08 MED ORDER — IBUPROFEN 400 MG PO TABS
400.0000 mg | ORAL_TABLET | ORAL | Status: DC | PRN
Start: 2012-10-08 — End: 2012-10-10
  Administered 2012-10-08 – 2012-10-10 (×3): 400 mg via ORAL
  Filled 2012-10-08 (×3): qty 1

## 2012-10-08 MED ORDER — LISINOPRIL 20 MG PO TABS
20.0000 mg | ORAL_TABLET | Freq: Every morning | ORAL | Status: DC
  Administered 2012-10-08 – 2012-10-10 (×3): 20 mg via ORAL
  Filled 2012-10-08 (×3): qty 1

## 2012-10-08 NOTE — Progress Notes (Signed)
  Echocardiogram 2D Echocardiogram has been performed.  Daisy Larson 10/08/2012, 3:29 PM

## 2012-10-08 NOTE — Progress Notes (Addendum)
[facility] Progress Note [place] TEAM 1 - Stepdown/ICU TEAM   DEIONDRA DENLEY ZOX:[id number] DOB: 03-09-1951 DOA: 10/07/2012 PCP: Luvenia Redden, MD  Brief narrative: 61 year old female patient with multiple medical problems who was sent to the emergency department by her family because of syncope while sitting at the computer- her husband noticed her head had slumped forward. He was unable to arouse her. After about 5 minutes she "woke up" but seem confused Pt was lethargic in the ER and "woke up" last night  -. This apparently has been ongoing for several years and worse over the past several months. Neurology has evaluated the patient. Initial diagnostic workup in the ER did not reveal any evidence of acute stroke or any focal neurological deficits. She did receive Narcan without any improvement of her symptoms. She was subsequently admitted to the step down unit for further monitoring and treatment.  Assessment/Plan: Principal Problem: Syncope *Mild CO2 elevation noted as well *MRI unrevealing-EEG pending *Differential includes seizure disorder versus sleep deprivation from undiagnosed sleep apnea and RLS  Active Problems:  Cirrhosis of liver- "juvenile" *Followed by gastroenterologist in [facility] *Patient states was told had a slow progressive cirrhosis listed as juvenile was evaluated by [facility] *LFTs only mildly elevated and a nonobstructive pattern and ammonia only mildly elevated at 61-patient was not on lactulose or other binding agent prior to admission  Systolic murmur *Followup on 2-D echocardiogram   OSA (obstructive sleep apnea) -suspected *We'll obtain a nocturnal pulse oximetry rinse study tonight and likely would benefit from outpatient polysomnogram testing   DIABETES MELLITUS, TYPE II, UNCONTROLLED *Hemoglobin A1c 7.2 *Utilizes a 70/25 self inject pen at home and husband will bring in so patient can utilize while hospitalized *Home metformin on  hold *Continue sliding scale insulin   Essential hypertension, benign *Current blood pressure moderately controlled *Resume home medications   Pica in adults/Anemia *Patient eats a large bag of ice daily *Check an anemia panel- also her RLS has been worse than usual *Also reported that patient had issues with low B12 but currently is receiving injections and oral repletion as an outpatient   HYPERLIPIDEMIA, MIXED, WITH HIGH HDL *Continue Pravachol   RESTLESS LEGS SYNDROME/Polyneuropathy in diabetes(357.2) *States medical therapy used prior to admission not helpful *Followup on anemia panel since low iron levels can contribute to restless legs symptoms *Continue Mirapex, Neurontin and magnesium as well as Cymbalta *PT and OT evaluations- pt states her balance is off  *TSH low normal with a low free T4  History of breast cancer in female *Continue Arimidex    DVT prophylaxis: Lovenox-no STDs given restless leg syndrome Code Status: Full Family Communication: Both with patient and her daughter Disposition Plan: Transfer to telemetry  Consultants: Neurology  Procedures: None  Antibiotics: None  HPI/Subjective: Patient is alert and quite animated and somewhat restless. Daughter in room.  Still becomes somewhat confused regarding fine or points of her past medical history- they have many complaints- daughter states that pt has severe RLS and has not been sleeping well over the past few months, she becoming forgetful and has balance issues now. She eats a bag of ice daily.    Objective: Blood pressure 128/89, pulse 75, temperature 98 F (36.7 C), temperature source Oral, resp. rate 19, height 5\' 8"  (1.727 m), weight 116.3 kg (256 lb 6.3 oz), SpO2 94.00%.  Intake/Output Summary (Last 24 hours) at 10/08/12 1238 Last data filed at 10/08/12 0800  Gross per 24 hour  Intake  900 ml  Output   1300 ml  Net   -400 ml     Exam: General: No acute respiratory  distress, Lungs: Clear to auscultation bilaterally without wheezes or crackles, room air Cardiovascular: Regular rate and rhythm without gallop or rub is have a soft systolic murmur but otherwise has normal S1 and S2, or JVD, no peripheral edema Abdomen: Nontender, nondistended, soft, bowel sounds positive, no rebound, no ascites, no appreciable mass Musculoskeletal: No significant cyanosis, clubbing of bilateral lower extremities Neurological: Patient is awake and alert and oriented x3 although she clearly has difficulty with short-term memory and recollection, exam is otherwise nonfocal  Data Reviewed: Basic Metabolic Panel:  Lab 10/08/12 1610 10/07/12 1746 10/07/12 1057  NA 140 -- 142  K 3.8 -- 4.3  CL 103 -- 105  CO2 29 -- 30  GLUCOSE 179* -- 240*  BUN 8 -- 9  CREATININE 0.55 0.62 0.66  CALCIUM 8.9 -- 9.5  MG -- -- --  PHOS -- -- --   Liver Function Tests:  Lab 10/08/12 0435 10/07/12 1057  AST 47* 56*  ALT 32 40*  ALKPHOS 131* 161*  BILITOT 0.5 0.4  PROT 6.3 7.3  ALBUMIN 2.8* 3.3*   No results found for this basename: LIPASE:5,AMYLASE:5 in the last 168 hours  Lab 10/07/12 1749  AMMONIA 61*   CBC:  Lab 10/08/12 0435 10/07/12 1746 10/07/12 1057  WBC 4.1 4.5 6.5  NEUTROABS -- -- --  HGB 9.8* 10.4* 10.9*  HCT 30.2* 33.1* 34.9*  MCV 86.0 86.9 86.4  PLT 90* 102* 130*   Cardiac Enzymes: No results found for this basename: CKTOTAL:5,CKMB:5,CKMBINDEX:5,TROPONINI:5 in the last 168 hours BNP (last 3 results) No results found for this basename: PROBNP:3 in the last 8760 hours CBG:  Lab 10/08/12 1200 10/08/12 0742 10/07/12 1740  GLUCAP 223* 156* 192*    Recent Results (from the past 240 hour(s))  MRSA PCR SCREENING     Status: Normal   Collection Time   10/07/12  5:27 PM      Component Value Range Status Comment   MRSA by PCR NEGATIVE  NEGATIVE Final      Studies:  Recent x-ray studies have been reviewed in detail by the Attending Physician  Scheduled  Meds:  Reviewed in detail by the Attending Physician   Junious Silk, ANP [facility] Office  [phone] Pager [phone]  On-Call/Text Page:      [email or web address]      password [email or web address]  If 7PM-7AM, please contact night-coverage [email or web address] Password [facility] 10/08/2012, 12:38 PM   LOS: 1 day   I have examined the patient and reviewed the chart. I have modified the above note and agree with it.   Calvert Cantor, MD [phone]

## 2012-10-08 NOTE — Progress Notes (Signed)
Subjective: Patient awake and alert.  Reports she does not remember talking with me on yesterday.  MRI of the brain performed and unremarkable.  EEG in progress.  Lab work only significant for a NH4 of 61 and ESR of 41.    Objective: Current vital signs: BP 161/61  Pulse 77  Temp 98 F (36.7 C) (Oral)  Resp 19  Ht 5\' 8"  (1.727 m)  Wt 116.3 kg (256 lb 6.3 oz)  BMI 38.98 kg/m2  SpO2 94% Vital signs in last 24 hours: Temp:  [98 F (36.7 C)-98.8 F (37.1 C)] 98 F (36.7 C) (12/10 1100) Pulse Rate:  [72-80] 77  (12/10 1100) Resp:  [11-23] 19  (12/10 1100) BP: (90-161)/(49-87) 161/61 mmHg (12/10 1120) SpO2:  [91 %-100 %] 94 % (12/10 1100) Weight:  [116.3 kg (256 lb 6.3 oz)] 116.3 kg (256 lb 6.3 oz) (12/09 1725)  Intake/Output from previous day: 12/09 0701 - 12/10 0700 In: 825 [I.V.:825] Out: 1300 [Urine:1300] Intake/Output this shift: Total I/O In: 75 [I.V.:75] Out: -  Nutritional status: Carb Control  Neurologic Exam: Mental Status:  Alert, oriented, thought content appropriate. Speech fluent without evidence of aphasia. Able to follow 3 step commands without difficulty.  Cranial Nerves:  II: Discs flat bilaterally; Visual fields grossly normal, pupils equal, round, reactive to light and accommodation  III,IV, VI: ptosis not present, extra-ocular motions intact bilaterally  V,VII: smile symmetric, facial light touch sensation normal bilaterally  VIII: hearing normal bilaterally  IX,X: gag reflex present  XI: bilateral shoulder shrug  XII: midline tongue extension  Motor:  Right : Upper extremity 5/5           Left: Upper extremity 5/5   Lower extremity 5/5        Lower extremity 5/5  Tone and bulk:normal tone throughout; no atrophy noted  Sensory: Pinprick and light touch intact throughout, bilaterally with decreased sensation up to mid shin (known peripheral neuropathy)  Deep Tendon Reflexes: 2+ and symmetric throughout UE, 1+ KJ, no ankle jerk  Plantars:  equivical    Lab Results: Basic Metabolic Panel:  Lab 10/08/12 1610 10/07/12 1746 10/07/12 1057  NA 140 -- 142  K 3.8 -- 4.3  CL 103 -- 105  CO2 29 -- 30  GLUCOSE 179* -- 240*  BUN 8 -- 9  CREATININE 0.55 0.62 0.66  CALCIUM 8.9 -- 9.5  MG -- -- --  PHOS -- -- --    Liver Function Tests:  Lab 10/08/12 0435 10/07/12 1057  AST 47* 56*  ALT 32 40*  ALKPHOS 131* 161*  BILITOT 0.5 0.4  PROT 6.3 7.3  ALBUMIN 2.8* 3.3*   No results found for this basename: LIPASE:5,AMYLASE:5 in the last 168 hours  Lab 10/07/12 1749  AMMONIA 61*    CBC:  Lab 10/08/12 0435 10/07/12 1746 10/07/12 1057  WBC 4.1 4.5 6.5  NEUTROABS -- -- --  HGB 9.8* 10.4* 10.9*  HCT 30.2* 33.1* 34.9*  MCV 86.0 86.9 86.4  PLT 90* 102* 130*    Cardiac Enzymes: No results found for this basename: CKTOTAL:5,CKMB:5,CKMBINDEX:5,TROPONINI:5 in the last 168 hours  Lipid Panel: No results found for this basename: CHOL:5,TRIG:5,HDL:5,CHOLHDL:5,VLDL:5,LDLCALC:5 in the last 168 hours  CBG:  Lab 10/08/12 0742 10/07/12 1740  GLUCAP 156* 192*    Microbiology: Results for orders placed during the hospital encounter of 10/07/12  MRSA PCR SCREENING     Status: Normal   Collection Time   10/07/12  5:27 PM      Component  Value Range Status Comment   MRSA by PCR NEGATIVE  NEGATIVE Final     Coagulation Studies: No results found for this basename: LABPROT:5,INR:5 in the last 72 hours  Imaging: Ct Head Wo Contrast  10/07/2012  *RADIOLOGY REPORT*  Clinical Data: Mental status changes.  Somnolence.  CT HEAD WITHOUT CONTRAST  Technique:  Contiguous axial images were obtained from the base of the skull through the vertex without contrast.  Comparison: None.  Findings: The the brain has a normal appearance without evidence of malformation, atrophy, old or acute infarction, mass lesion, hemorrhage, hydrocephalus or extra-axial collection.  The calvarium is unremarkable.  Sinuses, middle ears and mastoids are clear.  IMPRESSION:  Normal head CT   Original Report Authenticated By: Paulina Fusi, M.D.    Mr Laqueta Jean Wo Contrast  10/08/2012  *RADIOLOGY REPORT*  Clinical Data: Confusion with somnolence and breast cancer.  MRI HEAD WITHOUT AND WITH CONTRAST  Technique:  Multiplanar, multiecho pulse sequences of the brain and surrounding structures were obtained according to standard protocol without and with intravenous contrast  Contrast: 20mL MULTIHANCE GADOBENATE DIMEGLUMINE 529 MG/ML IV SOLN  Comparison: CT head 10/07/2012.  Findings: There is no evidence for acute infarction, intracranial hemorrhage, mass lesion, hydrocephalus, or extra-axial fluid.  Mild age related atrophy.  Minimal chronic microvascular ischemic change.  No significant lacunar infarcts.  No midline shift.  Flow voids are maintained in the major intracranial vessels.  No worrisome osseous lesions or midline abnormality.  Post infusion, no abnormal enhancement of the brain or meninges.  No visible intracranial metastatic deposits.  Mild chronic sinus disease.  No mastoid fluid.  IMPRESSION: Mild age related atrophy and minimal small vessel disease.  No acute intracranial abnormality.  No abnormal enhancement to suggest metastatic disease. Similar appearance to prior CT.   Original Report Authenticated By: Davonna Belling, M.D.    Portable Chest 1 View  10/07/2012  *RADIOLOGY REPORT*  Clinical Data: Hypoxia.  History breast cancer.  Diabetic. Nonsmoker.  PORTABLE CHEST - 1 VIEW  Comparison: 08/22/2012.  Findings: Mild pulmonary edema.  No segmental consolidation or gross pneumothorax.  Heart appears slightly enlarged.  Mild tortuosity ascending thoracic aorta.  IMPRESSION: Mild pulmonary edema.  This is a call report.   Original Report Authenticated By: Lacy Duverney, M.D.     Medications:  I have reviewed the patient's current medications. Scheduled:   . anastrozole  1 mg Oral Daily  . DULoxetine  60 mg Oral Daily  . enoxaparin (LOVENOX) injection  40 mg  Subcutaneous Q24H  . gabapentin  800 mg Oral TID  . insulin aspart  0-9 Units Subcutaneous TID WC  . insulin lispro protamine-insulin lispro  60 Units Subcutaneous Q supper  . insulin lispro protamine-insulin lispro  70 Units Subcutaneous Q breakfast  . lisinopril  20 mg Oral q morning - 10a  . magnesium oxide  400 mg Oral Daily  . pantoprazole  80 mg Oral Daily  . pramipexole  1 mg Oral QHS  . propranolol ER  60 mg Oral Daily  . [DISCONTINUED] Magnesium  1 capsule Oral Daily  . [DISCONTINUED] pantoprazole  40 mg Oral Daily    Assessment/Plan:  Patient Active Hospital Problem List: Altered mental status (10/07/2012)   Assessment: Work up in progress.  Blood work findings fairly benign.  MRI unremarkable.     Plan:  1.  Will follow up results of EEG.  Will base further work up on these results and determine need for LP and/or psych evaluation.  Case discussed with Dr. Ardyth Harps   LOS: 1 day   Thana Farr, MD [facility] [phone] 10/08/2012  11:24 AM

## 2012-10-08 NOTE — Evaluation (Signed)
Clinical/Bedside Swallow Evaluation Patient Details  Name: Daisy Larson MRN: [id number] Date of Birth: [birth year]  Today's Date: 10/08/2012 Time: 4098-1191 SLP Time Calculation (min): 15 min  Past Medical History:  Past Medical History  Diagnosis Date  . Cirrhosis   . Multiple thyroid nodules   . History of stomach ulcers   . Hernia   . Colon polyp   . Neuropathy of foot   . Restless legs   . Complication of anesthesia     WITH SECON HERNIA REPAIR 2012 [facility]  DIFFICULTY O2 SAT DROPPING ADMIT TO HOSPITAL   . Bundle branch block   . Hypertension   . Angina     COSTOCONDRITIS    . Shortness of breath     WITH EXERTION   . Diabetes mellitus   . Hepatitis      Did Not have hepatitis but needed Hep A and Hep B injections2012 SPOTS ON LIVER  DR Marcelene [place]  [phone]  . GERD (gastroesophageal reflux disease)     ULCERS  . Hyperthyroidism     NODULES ON THYROID  (DR BALEN 37  12-609)  . Goiter   . Headache     MIGRAINES  . Neuromuscular disorder     PERIPH NEUROPATHY   . Cancer     RIGHT BREAST  . Anxiety   . Depression   . Breast cancer 01/19/12     right breast lumpectomy/ER/PR positibe,her-2 neg.  . Allergy     tape  . S/P radiation therapy 02/19/12 - 04/01/12    Right Breast: 5000 cgy/25 Fractions with Boost/ 1000 cGy/5 Fractions   Past Surgical History:  Past Surgical History  Procedure Date  . Appendectomy   . Tonsillectomy   . Cesarean section   . Tubal ligation   . Abdominal hysterectomy   . Back surgery   . Liver biopsy   . Hernia repair     X 2  . Anal fissure repair   . Carpal tunnel release     LEFT   . Colonoscopy w/ polypectomy   . Breast surgery     BIOPSY  Right Breast -  Sentinel Lymph Nodes  . Back surgery   . Liver biopsy 03/28/11    [facility] - Cirrhosis  . Chronic hepatitis   . Thyroid nodules   . Spinal fusion    HPI:  61 yr old admitted with syncopal episode.  History of syncopal episodes over past month, gait  disturbance per daughter, breast CA, hypothroidism, GERD, HTN, DM, goiter, cirrhosis.  Pt. reports globus sensation with pills and solids.  She denies coughing with liquids.       Assessment / Plan / Recommendation Clinical Impression  Pt. exhibited a functional oropharyngeal swallow.  No difficlutly with oral prep, mastication and transit with solids.  Laryngeal elevation was WNL's and no indications of aspiration.  She did not experience globus sensation with pill during this assessment.  Recommend regular texture diet and regular diet with esophageal precautions.  Pills ok with thin and educated pt./family to follow pill with additional bite of solid to help propel bolus through esophagus if needed.  SLP also observed pt.'s bilateral hands appear somewhat weak (? decreased fine motor movements?).  MRI was negative for acute process.  She is currently undergoing full workup of symptoms over past month.  SLP will follow up once more for education and tolerance of diet.       Aspiration Risk  Mild    Diet  Recommendation Regular;Thin liquid   Liquid Administration via: Cup;Straw Medication Administration: Whole meds with liquid Supervision: Patient able to self feed;Intermittent supervision to cue for compensatory strategies Compensations: Slow rate;Small sips/bites Postural Changes and/or Swallow Maneuvers: Seated upright 90 degrees;Upright 30-60 min after meal    Other  Recommendations Oral Care Recommendations: Oral care BID   Follow Up Recommendations  None    Frequency and Duration min 1 x/week  1 week       SLP Swallow Goals Patient will consume recommended diet without observed clinical signs of aspiration with: Independent assistance Patient will utilize recommended strategies during swallow to increase swallowing safety with: Independent assistance   Swallow Study Prior Functional Status       General HPI: 61 yr old admitted with syncopal episode.  History of syncopal  episodes over past month, gait disturbance per daughter, breast CA, hypothroidism, GERD, HTN, DM, goiter, cirrhosis.  Pt. reports globus sensation with pills and solids.  She denies coughing with liquids.     Type of Study: Bedside swallow evaluation Diet Prior to this Study: NPO Temperature Spikes Noted: No Respiratory Status: Room air History of Recent Intubation: No Behavior/Cognition: Alert;Cooperative;Pleasant mood Oral Cavity - Dentition: Adequate natural dentition Self-Feeding Abilities: Able to feed self Patient Positioning: Upright in bed Baseline Vocal Quality: Clear Volitional Cough: Strong Volitional Swallow: Able to elicit    Oral/Motor/Sensory Function Overall Oral Motor/Sensory Function: Appears within functional limits for tasks assessed   Ice Chips Ice chips: Not tested   Thin Liquid Thin Liquid: Within functional limits Presentation: Cup;Straw    Nectar Thick Nectar Thick Liquid: Not tested   Honey Thick Honey Thick Liquid: Not tested   Puree Puree: Within functional limits   Solid       Solid: Within functional limits       Royce Macadamia M.Ed [employer] [phone]  10/08/2012

## 2012-10-08 NOTE — Progress Notes (Signed)
[facility] today.  Letter mailed to patient.  

## 2012-10-08 NOTE — Progress Notes (Signed)
Portable EEG completed

## 2012-10-09 DIAGNOSIS — G4733 Obstructive sleep apnea (adult) (pediatric): Secondary | ICD-10-CM

## 2012-10-09 DIAGNOSIS — G9340 Encephalopathy, unspecified: Secondary | ICD-10-CM | POA: Diagnosis present

## 2012-10-09 LAB — GLUCOSE, CAPILLARY
Glucose-Capillary: 157 mg/dL — ABNORMAL HIGH (ref 70–99)
Glucose-Capillary: 233 mg/dL — ABNORMAL HIGH (ref 70–99)

## 2012-10-09 NOTE — Progress Notes (Signed)
[facility] Progress Note    Daisy Larson ZOX:[id number] DOB: 1951/05/24 DOA: 10/07/2012 PCP: Luvenia Redden, MD  Brief narrative: 61 year old female patient with multiple medical problems who was sent to the emergency department by her family because of syncope while sitting at the computer- her husband noticed her head had slumped forward. He was unable to arouse her. After about 5 minutes she "woke up" but seem confused Pt was lethargic in the ER and "woke up" last night  -. This apparently has been ongoing for several years and worse over the past several months. Neurology has evaluated the patient. Initial diagnostic workup in the ER did not reveal any evidence of acute stroke or any focal neurological deficits. She did receive Narcan without any improvement of her symptoms. She was subsequently admitted to the step down unit for further monitoring and treatment.  Assessment/Plan: Principal Problem:  Acute encephalopathy  Unclear etiology.   Workup including MRI of brain, CT head, blood studies unremarkable.  Possibly multifactorial-OSA with mild hypercapnia, pain medications (patient took hydromorphone 10 mg times one on night prior to admission-spouse's medication). Per spouse, mental status has improved and is almost back to baseline.  Possible sleep apnea   Patient did drop oxygen saturations to 70s early this morning. Per spouse, patient has history of snoring and breath holding spells at night. She has history of somnolence in a.m. and even while driving-does not drive anymore/family drives her around.  Will need outpatient sleep study and further evaluation. Spouse has already contacted PCP to arrange same.   Discussed with pulmonology-they recommended placing on CPAP with auto titration overnight and see how she tolerates it. DC in a.m. with CPAP on auto titration.   Syncope  Unclear etiology.  Workup inconclusive for etiology.  Neurology followup  appreciated.  Cirrhosis of liver- "juvenile"  Followed by gastroenterologist in [facility]  Patient states was told had a slow progressive cirrhosis listed as juvenile was evaluated by [facility] hospital  LFTs only mildly elevated and a nonobstructive pattern and ammonia only mildly elevated at 61-patient was not on lactulose or other binding agent prior to admission  Systolic murmur  Echo unremarkable.  DIABETES MELLITUS, TYPE II, UNCONTROLLED  Hemoglobin A1c 7.2  Utilizes a 70/25 self inject pen at home and husband will bring in so patient can utilize while hospitalized  Home metformin on hold  Continue sliding scale insulin  Reasonable inpatient control.   Essential hypertension, benign  Current blood pressure moderately controlled  Resume home medications   Pica in adults/Anemia  Patient eats a large bag of ice daily  Anemia panel suggests iron deficiency. Will supplement by mouth.  Also reported that patient had issues with low B12 but currently is receiving injections and oral repletion as an outpatient. B12 levels look normal.   HYPERLIPIDEMIA, MIXED, WITH HIGH HDL  Continue Pravachol   RESTLESS LEGS SYNDROME/Polyneuropathy in diabetes(357.2)  States medical therapy used prior to admission not helpful  Continue Mirapex, Neurontin and magnesium as well as Cymbalta  PT and OT evaluations- pt states her balance is off  TSH low normal with a low free T4  Outpatient followup with repeat TFTs in 4-6 weeks from discharge. Clinically appears euthyroid. No supplements at this time.    History of breast cancer in female  Continue Arimidex-Will discuss with oncology in a.m. if this is contributing to any of her presentation/symptoms.  Thrombocytopenia  ? Secondary to cirrhosis. Follow CBCs.  DVT prophylaxis: Lovenox-no STDs given restless leg syndrome Code  Status: Full Family Communication:  discussed with spouse at bedside.  Disposition Plan:  home when  stable-? 12/12.  Consultants: Neurology  Procedures: None  Antibiotics: None  HPI/Subjective: Mild headache at vertex. Per spouse at bedside, mental status is almost back to baseline.    Objective: Blood pressure 118/72, pulse 72, temperature 97.4 F (36.3 C), temperature source Oral, resp. rate 18, height 5\' 8"  (1.727 m), weight 116.3 kg (256 lb 6.3 oz), SpO2 98.00%.  Intake/Output Summary (Last 24 hours) at 10/09/12 1647 Last data filed at 10/09/12 1230  Gross per 24 hour  Intake    600 ml  Output      0 ml  Net    600 ml     Exam: General: No acute respiratory distress, Lungs: Clear to auscultation bilaterally without wheezes or crackles, room air Cardiovascular: Regular rate and rhythm without gallop or rub is have a soft systolic murmur but otherwise has normal S1 and S2, or JVD, no peripheral edema. Telemetry shows normal sinus rhythm.  Abdomen: Nontender, nondistended, soft, bowel sounds positive, no rebound, no ascites, no appreciable mass Musculoskeletal: No significant cyanosis, clubbing of bilateral lower extremities Neurological: Patient is awake and alert and oriented x3 although she clearly has difficulty with short-term memory and recollection, exam is otherwise nonfocal  Data Reviewed: Basic Metabolic Panel:  Lab 10/08/12 4098 10/07/12 1746 10/07/12 1057  NA 140 -- 142  K 3.8 -- 4.3  CL 103 -- 105  CO2 29 -- 30  GLUCOSE 179* -- 240*  BUN 8 -- 9  CREATININE 0.55 0.62 0.66  CALCIUM 8.9 -- 9.5  MG -- -- --  PHOS -- -- --   Liver Function Tests:  Lab 10/08/12 0435 10/07/12 1057  AST 47* 56*  ALT 32 40*  ALKPHOS 131* 161*  BILITOT 0.5 0.4  PROT 6.3 7.3  ALBUMIN 2.8* 3.3*   No results found for this basename: LIPASE:5,AMYLASE:5 in the last 168 hours  Lab 10/07/12 1749  AMMONIA 61*   CBC:  Lab 10/08/12 0435 10/07/12 1746 10/07/12 1057  WBC 4.1 4.5 6.5  NEUTROABS -- -- --  HGB 9.8* 10.4* 10.9*  HCT 30.2* 33.1* 34.9*  MCV 86.0 86.9 86.4   PLT 90* 102* 130*   Cardiac Enzymes: No results found for this basename: CKTOTAL:5,CKMB:5,CKMBINDEX:5,TROPONINI:5 in the last 168 hours BNP (last 3 results) No results found for this basename: PROBNP:3 in the last 8760 hours CBG:  Lab 10/09/12 1143 10/09/12 0633 10/08/12 2053 10/08/12 1636 10/08/12 1200  GLUCAP 224* 157* 176* 263* 223*    Recent Results (from the past 240 hour(s))  MRSA PCR SCREENING     Status: Normal   Collection Time   10/07/12  5:27 PM      Component Value Range Status Comment   MRSA by PCR NEGATIVE  NEGATIVE Final      Studies:  Recent x-ray studies have been reviewed in detail by the Attending Physician  Scheduled Meds:  Reviewed in detail by the Attending Physician   [facility] 4:57 PM Pager: [phone]  If 7PM-7AM, please contact night-coverage [email or web address] Password [email or web address] 10/09/2012, 4:47 PM   LOS: 2 days

## 2012-10-09 NOTE — Procedures (Signed)
EEG NUMBER:  HISTORY:  A 61 year old female with syncope evaluate to rule out seizure.  MEDICATIONS:  NovoLog, Lovenox, Neurontin, Cymbalta, lisinopril, pravastatin, aspirin, and Inderal.  CONDITIONS OF RECORDING:  This is a 16-channel EEG carried out with the patient in the awake and drowsy state.  DESCRIPTION:  The waking background activity consists of a low-voltage, symmetrical, fairly well-organized 7 hertz theta activity seen from the parieto-occipital and posterotemporal regions.  Low voltage, fast activity, poorly organized was seen anteriorly at times, superimposed on more posterior rhythms.  A mixture of theta and alpha was seen from these central and temporal regions.  The patient drowses with slowing to irregular, low-voltage theta and beta activity.  Intermittent underlying polymorphic delta activity is seen of both hemispheres as well during drowse.  Stage II sleep was not obtained.  Hyperventilation was performed and elicited a mild to moderate buildup, but failed to elicit any abnormalities.  Intermittent photic stimulation was performed, but failed to elicit any change in the tracing.  IMPRESSION:  This is an abnormal EEG secondary to posterior background slowing.  This finding can be seen with a diffuse gray matter disturbance that is etiologically nonspecific, but may include a dementia among other possibilities.  No epileptiform activity was noted.          ______________________________ Thana Farr, MD    ZO:[id number] D:  10/08/2012 18:53:05  T:  10/09/2012 05:05:28  Job #:  [id number]

## 2012-10-09 NOTE — Progress Notes (Signed)
SLP has reviewed and agrees with student's note below.  Breck Coons [place].Ed [employer] [phone]  10/09/2012

## 2012-10-09 NOTE — Progress Notes (Signed)
Subjective: No complaints. Husband thinks that she is more alert today compared to the last few days. No confusion noted by family members.  Objective: Current vital signs: BP 127/64  Pulse 70  Temp 97.3 F (36.3 C) (Oral)  Resp 18  Ht 5\' 8"  (1.727 m)  Wt 116.3 kg (256 lb 6.3 oz)  BMI 38.98 kg/m2  SpO2 99%  Neurologic Exam: Alert and oriented x3. No acute distress. Speech was normal. Moved extremities equally.  Lab Results: Results for orders placed during the hospital encounter of 10/07/12 (from the past 48 hour(s))  CBC     Status: Abnormal   Collection Time   10/07/12 10:57 AM      Component Value Range Comment   WBC 6.5  4.0 - 10.5 K/uL    RBC 4.04  3.87 - 5.11 MIL/uL    Hemoglobin 10.9 (*) 12.0 - 15.0 g/dL    HCT 16.1 (*) 09.6 - 46.0 %    MCV 86.4  78.0 - 100.0 fL    MCH 27.0  26.0 - 34.0 pg    MCHC 31.2  30.0 - 36.0 g/dL    RDW 04.5 (*) 40.9 - 15.5 %    Platelets 130 (*) 150 - 400 K/uL   COMPREHENSIVE METABOLIC PANEL     Status: Abnormal   Collection Time   10/07/12 10:57 AM      Component Value Range Comment   Sodium 142  135 - 145 mEq/L    Potassium 4.3  3.5 - 5.1 mEq/L    Chloride 105  96 - 112 mEq/L    CO2 30  19 - 32 mEq/L    Glucose, Bld 240 (*) 70 - 99 mg/dL    BUN 9  6 - 23 mg/dL    Creatinine, Ser 8.11  0.50 - 1.10 mg/dL    Calcium 9.5  8.4 - 91.4 mg/dL    Total Protein 7.3  6.0 - 8.3 g/dL    Albumin 3.3 (*) 3.5 - 5.2 g/dL    AST 56 (*) 0 - 37 U/L    ALT 40 (*) 0 - 35 U/L    Alkaline Phosphatase 161 (*) 39 - 117 U/L    Total Bilirubin 0.4  0.3 - 1.2 mg/dL    GFR calc non Af Amer >90  >90 mL/min    GFR calc Af Amer >90  >90 mL/min   TSH     Status: Normal   Collection Time   10/07/12 10:57 AM      Component Value Range Comment   TSH 1.589  0.350 - 4.500 uIU/mL   T4, FREE     Status: Normal   Collection Time   10/07/12 10:57 AM      Component Value Range Comment   Free T4 0.86  0.80 - 1.80 ng/dL   URINALYSIS, ROUTINE W REFLEX MICROSCOPIC      Status: Abnormal   Collection Time   10/07/12 12:23 PM      Component Value Range Comment   Color, Urine YELLOW  YELLOW    APPearance CLEAR  CLEAR    Specific Gravity, Urine 1.016  1.005 - 1.030    pH 5.0  5.0 - 8.0    Glucose, UA NEGATIVE  NEGATIVE mg/dL    Hgb urine dipstick NEGATIVE  NEGATIVE    Bilirubin Urine NEGATIVE  NEGATIVE    Ketones, ur NEGATIVE  NEGATIVE mg/dL    Protein, ur NEGATIVE  NEGATIVE mg/dL    Urobilinogen, UA 1.0  0.0 - 1.0 mg/dL    Nitrite NEGATIVE  NEGATIVE    Leukocytes, UA SMALL (*) NEGATIVE   URINE MICROSCOPIC-ADD ON     Status: Abnormal   Collection Time   10/07/12 12:23 PM      Component Value Range Comment   Squamous Epithelial / LPF FEW (*) RARE    WBC, UA 3-6  <3 WBC/hpf    RBC / HPF 0-2  <3 RBC/hpf    Bacteria, UA RARE  RARE   POCT I-STAT 3, BLOOD GAS (G3+)     Status: Abnormal   Collection Time   10/07/12  3:08 PM      Component Value Range Comment   pH, Arterial 7.363  7.350 - 7.450    pCO2 arterial 52.2 (*) 35.0 - 45.0 mmHg    pO2, Arterial 78.0 (*) 80.0 - 100.0 mmHg    Bicarbonate 29.7 (*) 20.0 - 24.0 mEq/L    TCO2 31  0 - 100 mmol/L    O2 Saturation 95.0      Acid-Base Excess 3.0 (*) 0.0 - 2.0 mmol/L    Collection site RADIAL, ALLEN'S TEST ACCEPTABLE      Drawn by Operator      Sample type ARTERIAL     MRSA PCR SCREENING     Status: Normal   Collection Time   10/07/12  5:27 PM      Component Value Range Comment   MRSA by PCR NEGATIVE  NEGATIVE   GLUCOSE, CAPILLARY     Status: Abnormal   Collection Time   10/07/12  5:40 PM      Component Value Range Comment   Glucose-Capillary 192 (*) 70 - 99 mg/dL    Comment 1 Notify RN     CBC     Status: Abnormal   Collection Time   10/07/12  5:46 PM      Component Value Range Comment   WBC 4.5  4.0 - 10.5 K/uL    RBC 3.81 (*) 3.87 - 5.11 MIL/uL    Hemoglobin 10.4 (*) 12.0 - 15.0 g/dL    HCT 11.9 (*) 14.7 - 46.0 %    MCV 86.9  78.0 - 100.0 fL    MCH 27.3  26.0 - 34.0 pg    MCHC 31.4  30.0 -  36.0 g/dL    RDW 82.9 (*) 56.2 - 15.5 %    Platelets 102 (*) 150 - 400 K/uL PLATELET COUNT CONFIRMED BY SMEAR  CREATININE, SERUM     Status: Normal   Collection Time   10/07/12  5:46 PM      Component Value Range Comment   Creatinine, Ser 0.62  0.50 - 1.10 mg/dL    GFR calc non Af Amer >90  >90 mL/min    GFR calc Af Amer >90  >90 mL/min   HEMOGLOBIN A1C     Status: Abnormal   Collection Time   10/07/12  5:46 PM      Component Value Range Comment   Hemoglobin A1C 7.2 (*) <5.7 %    Mean Plasma Glucose 160 (*) <117 mg/dL   TSH     Status: Normal   Collection Time   10/07/12  5:46 PM      Component Value Range Comment   TSH 0.787  0.350 - 4.500 uIU/mL   T4, FREE     Status: Abnormal   Collection Time   10/07/12  5:46 PM      Component Value Range Comment   Free T4  0.76 (*) 0.80 - 1.80 ng/dL   T3, FREE     Status: Normal   Collection Time   10/07/12  5:46 PM      Component Value Range Comment   T3, Free 2.6  2.3 - 4.2 pg/mL   AMMONIA     Status: Abnormal   Collection Time   10/07/12  5:49 PM      Component Value Range Comment   Ammonia 61 (*) 11 - 60 umol/L   VITAMIN B12     Status: Abnormal   Collection Time   10/07/12  6:38 PM      Component Value Range Comment   Vitamin B-12 1769 (*) 211 - 911 pg/mL   FOLATE     Status: Normal   Collection Time   10/07/12  6:38 PM      Component Value Range Comment   Folate >20.0     RPR     Status: Normal   Collection Time   10/07/12  6:38 PM      Component Value Range Comment   RPR NON REACTIVE  NON REACTIVE   SEDIMENTATION RATE     Status: Abnormal   Collection Time   10/07/12  6:38 PM      Component Value Range Comment   Sed Rate 41 (*) 0 - 22 mm/hr   COMPREHENSIVE METABOLIC PANEL     Status: Abnormal   Collection Time   10/08/12  4:35 AM      Component Value Range Comment   Sodium 140  135 - 145 mEq/L    Potassium 3.8  3.5 - 5.1 mEq/L    Chloride 103  96 - 112 mEq/L    CO2 29  19 - 32 mEq/L    Glucose, Bld 179 (*) 70 - 99  mg/dL    BUN 8  6 - 23 mg/dL    Creatinine, Ser 1.61  0.50 - 1.10 mg/dL    Calcium 8.9  8.4 - 09.6 mg/dL    Total Protein 6.3  6.0 - 8.3 g/dL    Albumin 2.8 (*) 3.5 - 5.2 g/dL    AST 47 (*) 0 - 37 U/L    ALT 32  0 - 35 U/L    Alkaline Phosphatase 131 (*) 39 - 117 U/L    Total Bilirubin 0.5  0.3 - 1.2 mg/dL    GFR calc non Af Amer >90  >90 mL/min    GFR calc Af Amer >90  >90 mL/min   CBC     Status: Abnormal   Collection Time   10/08/12  4:35 AM      Component Value Range Comment   WBC 4.1  4.0 - 10.5 K/uL    RBC 3.51 (*) 3.87 - 5.11 MIL/uL    Hemoglobin 9.8 (*) 12.0 - 15.0 g/dL    HCT 04.5 (*) 40.9 - 46.0 %    MCV 86.0  78.0 - 100.0 fL    MCH 27.9  26.0 - 34.0 pg    MCHC 32.5  30.0 - 36.0 g/dL    RDW 81.1  91.4 - 78.2 %    Platelets 90 (*) 150 - 400 K/uL CONSISTENT WITH PREVIOUS RESULT  GLUCOSE, CAPILLARY     Status: Abnormal   Collection Time   10/08/12  7:42 AM      Component Value Range Comment   Glucose-Capillary 156 (*) 70 - 99 mg/dL   VITAMIN N56     Status: Abnormal   Collection Time  10/08/12 10:56 AM      Component Value Range Comment   Vitamin B-12 1465 (*) 211 - 911 pg/mL   FOLATE     Status: Normal   Collection Time   10/08/12 10:56 AM      Component Value Range Comment   Folate >20.0     IRON AND TIBC     Status: Abnormal   Collection Time   10/08/12 10:56 AM      Component Value Range Comment   Iron 64  42 - 135 ug/dL    TIBC 409  811 - 914 ug/dL    Saturation Ratios 18 (*) 20 - 55 %    UIBC 289  125 - 400 ug/dL   FERRITIN     Status: Normal   Collection Time   10/08/12 10:56 AM      Component Value Range Comment   Ferritin 12  10 - 291 ng/mL   RETICULOCYTES     Status: Abnormal   Collection Time   10/08/12 10:56 AM      Component Value Range Comment   Retic Ct Pct 2.1  0.4 - 3.1 %    RBC. 3.55 (*) 3.87 - 5.11 MIL/uL    Retic Count, Manual 74.6  19.0 - 186.0 K/uL   GLUCOSE, CAPILLARY     Status: Abnormal   Collection Time   10/08/12 12:00  PM      Component Value Range Comment   Glucose-Capillary 223 (*) 70 - 99 mg/dL    Comment 1 Notify RN     GLUCOSE, CAPILLARY     Status: Abnormal   Collection Time   10/08/12  4:36 PM      Component Value Range Comment   Glucose-Capillary 263 (*) 70 - 99 mg/dL   GLUCOSE, CAPILLARY     Status: Abnormal   Collection Time   10/08/12  8:53 PM      Component Value Range Comment   Glucose-Capillary 176 (*) 70 - 99 mg/dL    Comment 1 Documented in Chart      Comment 2 Notify RN     GLUCOSE, CAPILLARY     Status: Abnormal   Collection Time   10/09/12  6:33 AM      Component Value Range Comment   Glucose-Capillary 157 (*) 70 - 99 mg/dL     Studies/Results: Ct Head Wo Contrast  10/07/2012  *RADIOLOGY REPORT*  Clinical Data: Mental status changes.  Somnolence.  CT HEAD WITHOUT CONTRAST  Technique:  Contiguous axial images were obtained from the base of the skull through the vertex without contrast.  Comparison: None.  Findings: The the brain has a normal appearance without evidence of malformation, atrophy, old or acute infarction, mass lesion, hemorrhage, hydrocephalus or extra-axial collection.  The calvarium is unremarkable.  Sinuses, middle ears and mastoids are clear.  IMPRESSION: Normal head CT   Original Report Authenticated By: Paulina Fusi, M.D.    Mr Laqueta Jean Wo Contrast  10/08/2012  *RADIOLOGY REPORT*  Clinical Data: Confusion with somnolence and breast cancer.  MRI HEAD WITHOUT AND WITH CONTRAST  Technique:  Multiplanar, multiecho pulse sequences of the brain and surrounding structures were obtained according to standard protocol without and with intravenous contrast  Contrast: 20mL MULTIHANCE GADOBENATE DIMEGLUMINE 529 MG/ML IV SOLN  Comparison: CT head 10/07/2012.  Findings: There is no evidence for acute infarction, intracranial hemorrhage, mass lesion, hydrocephalus, or extra-axial fluid.  Mild age related atrophy.  Minimal chronic microvascular ischemic change.  No significant  lacunar infarcts.  No midline shift.  Flow voids are maintained in the major intracranial vessels.  No worrisome osseous lesions or midline abnormality.  Post infusion, no abnormal enhancement of the brain or meninges.  No visible intracranial metastatic deposits.  Mild chronic sinus disease.  No mastoid fluid.  IMPRESSION: Mild age related atrophy and minimal small vessel disease.  No acute intracranial abnormality.  No abnormal enhancement to suggest metastatic disease. Similar appearance to prior CT.   Original Report Authenticated By: Davonna Belling, M.D.    Portable Chest 1 View  10/07/2012  *RADIOLOGY REPORT*  Clinical Data: Hypoxia.  History breast cancer.  Diabetic. Nonsmoker.  PORTABLE CHEST - 1 VIEW  Comparison: 08/22/2012.  Findings: Mild pulmonary edema.  No segmental consolidation or gross pneumothorax.  Heart appears slightly enlarged.  Mild tortuosity ascending thoracic aorta.  IMPRESSION: Mild pulmonary edema.  This is a call report.   Original Report Authenticated By: Lacy Duverney, M.D.     Medications:  Scheduled:   . anastrozole  1 mg Oral Daily  . DULoxetine  60 mg Oral Daily  . enoxaparin (LOVENOX) injection  40 mg Subcutaneous Q24H  . gabapentin  800 mg Oral TID  . insulin aspart  0-9 Units Subcutaneous TID WC  . insulin lispro protamine-insulin lispro  60 Units Subcutaneous Q supper  . insulin lispro protamine-insulin lispro  70 Units Subcutaneous Q breakfast  . lisinopril  20 mg Oral q morning - 10a  . magnesium oxide  400 mg Oral Daily  . pantoprazole  80 mg Oral Daily  . pramipexole  1 mg Oral QHS  . propranolol ER  60 mg Oral Daily  . [DISCONTINUED] gabapentin  800 mg Oral TID  . [DISCONTINUED] Magnesium  1 capsule Oral Daily  . [DISCONTINUED] pantoprazole  40 mg Oral Daily   ZOX:[id number], acetaminophen, ibuprofen, naLOXone (NARCAN)  injection, ondansetron (ZOFRAN) IV, ondansetron  Assessment/Plan: 61 year old lady with mental status changes of unclear  etiology. MRI was unremarkable except for mild age related atrophic changes. EEG showed mild slowing posteriorly background activity. TSH was normal. However, T4 level was slightly low. This may be a contributing factor in the patient's mental status changes as well as tendency to doze off very quickly during the day. Overnight oximetry study results are pending. Sleep apnea cannot be ruled out at this point.  Recommendations: 1. Thyroid replacement as this may increase patient's energy level as well as cognitive functioning. 2. Followup EEG 6 weeks after starting thyroid replacement therapy. 3. Formal overnight sleep study is oximetry monitoring indicates possible sleep apnea.  C.R. Roseanne Reno, MD [facility] [phone] Leonette Most needs to reach me urgently to [phone] fax  10/09/2012  9:42 AM

## 2012-10-09 NOTE — Care Management Note (Signed)
    Page 1 of 2   10/10/2012     3:14:41 PM   CARE MANAGEMENT NOTE 10/10/2012  Patient:  Daisy Larson, Daisy Larson   Account Number:  [id number]  Date Initiated:  10/09/2012  Documentation initiated by:  Averleigh Savary  Subjective/Objective Assessment:   PT ADM WITH MENTAL STATUS CHANGES ON 10/07/12.  PTA, PT INDEPENDENT, LIVES WITH SPOUSE.     Action/Plan:   MET WITH PT AND HUSBAND.  PT SLEEPING.  HUSBAND STATES WILL PROVIDE CARE AT DC.  MAY NEED HOME O2 FOR NIGHT USE VS CPAP MACHINE.  WILL FOLLOW.   Anticipated DC Date:  10/10/2012   Anticipated DC Plan:  HOME/SELF CARE      DC Planning Services  CM consult      PAC Choice  DURABLE MEDICAL EQUIPMENT   Choice offered to / List presented to:  C-1 Patient   DME arranged  CPAP      DME agency  [facility]     [facility] arranged  HH-7 RESPIRATORY THERAPY      HH agency  [facility]   Status of service:  Completed, signed off Medicare Important Message given?   (If response is "NO", the following Medicare IM given date fields will be blank) Date Medicare IM given:   Date Additional Medicare IM given:    Discharge Disposition:  HOME W HOME HEALTH SERVICES  Per UR Regulation:  Reviewed for med. necessity/level of care/duration of stay  If discussed at Long Length of Stay Meetings, dates discussed:    Comments:  10/10/12 Mariluz Crespo,RN,BSN [phone] PT FOR DISCHARGE HOME TODAY WITH HOME CPAP MACHINE. REFERRAL TO [facility] FOR DME NEEDS.  SLEEP STUDY SCHEDULED WITH SLEEP CENTER AT [facility] FOR DECEMBER 23 AT 8:15PM, PER PT CHOICE.   APPT MADE FOR PT TO FOLLOW UP WITH DR Vassie Loll OF [facility] ON 12/17 AT 11:45.  FAXED COMPLETED REFERRAL FORM TO [facility] AT [facility], PHONE [phone], FAX [phone].  SLEEP CENTER WILL CALL PT WITH SLEEP STUDY DETAILS.  PT APPRECIATIVE OF HELP.  [facility] TO DELIVER CPAP MACHINE TO PT'S HOME LATER TODAY AFTER DC.

## 2012-10-09 NOTE — Progress Notes (Signed)
OT Cancellation Note  Patient Details Name: Daisy Larson MRN: [id number] DOB: 12/14/1950   Cancelled Treatment:    Reason Eval/Treat Not Completed: Other (comment) (OT Screen- Pt and PT report no needs.)  Valicia Rief A OTR/L [phone] 10/09/2012, 11:44 AM

## 2012-10-09 NOTE — Evaluation (Signed)
Physical Therapy Evaluation Patient Details Name: Daisy Larson MRN: [id number] DOB: Mar 31, 1951 Today's Date: 10/09/2012 Time: 4098-1191 PT Time Calculation (min): 34 min  PT Assessment / Plan / Recommendation Clinical Impression  Pt s/p DM with good mobility today overall.  Pt knows her limitations and is at baseline per patient and husband.  Does not need skilled PT.  Notified OT that patient does not need OT eval.  Will not follow.      PT Assessment  Patent does not need any further PT services    Follow Up Recommendations  No PT follow up    Does the patient have the potential to tolerate intense rehabilitation      Barriers to Discharge        Equipment Recommendations  None recommended by PT    Recommendations for Other Services     Frequency      Precautions / Restrictions Precautions Precautions: None Restrictions Weight Bearing Restrictions: No   Pertinent Vitals/Pain VSS, Some pain      Mobility  Bed Mobility Bed Mobility: Rolling Right;Right Sidelying to Sit Rolling Right: 7: Independent Right Sidelying to Sit: 7: Independent Transfers Transfers: Sit to Stand;Stand to Sit Sit to Stand: 7: Independent Stand to Sit: 7: Independent Ambulation/Gait Ambulation/Gait Assistance: 7: Independent Ambulation Distance (Feet): 350 Feet Assistive device: None Ambulation/Gait Assistance Details: Pt ambulates well overall with occasional staggering gait however patient self corrects without PT assist or cuing.   Gait Pattern: Wide base of support;Step-through pattern Stairs: Yes Stairs Assistance: 6: Modified independent (Device/Increase time) Stair Management Technique: Two rails Number of Stairs: 4  Wheelchair Mobility Wheelchair Mobility: No    PT Goals  N/A  Visit Information  Last PT Received On: 10/09/12 Assistance Needed: +1    Subjective Data  Subjective: "I feel so much better.  I only have trouble carrying the groceries in." Patient  Stated Goal: To go home   Prior Functioning  Home Living Lives With: Spouse Available Help at Discharge: Family;Available 24 hours/day Type of Home: House Home Access: Stairs to enter [employer] of Steps: 5 Entrance Stairs-Rails: Right;Left;Can reach both Home Layout: One level Bathroom Shower/Tub: Health visitor: Handicapped height (vanity beside) Home Adaptive Equipment: Shower chair with back;Walker - rolling;Straight cane;Crutches;Hand-held shower hose;Wheelchair - manual Prior Function Level of Independence: Independent Able to Take Stairs?: Yes Driving: No Vocation: Retired Musician: No difficulties Dominant Hand: Left    Cognition  Overall Cognitive Status: Appears within functional limits for tasks assessed/performed Arousal/Alertness: Awake/alert Orientation Level: Oriented X4 / Intact Behavior During Session: WFL for tasks performed    Extremity/Trunk Assessment Right Upper Extremity Assessment RUE ROM/Strength/Tone: [facility] for tasks assessed Left Upper Extremity Assessment LUE ROM/Strength/Tone: WFL for tasks assessed Right Lower Extremity Assessment RLE ROM/Strength/Tone: [facility] for tasks assessed Left Lower Extremity Assessment LLE ROM/Strength/Tone: WFL for tasks assessed Trunk Assessment Trunk Assessment: Normal   Balance Standardized Balance Assessment Standardized Balance Assessment: Dynamic Gait Index Dynamic Gait Index Level Surface: Normal Change in Gait Speed: Normal Gait with Horizontal Head Turns: Normal Gait with Vertical Head Turns: Normal Gait and Pivot Turn: Normal Step Over Obstacle: Mild Impairment Step Around Obstacles: Normal Steps: Mild Impairment Total Score: 22  High Level Balance High Level Balance Comments: Scored 22/24 on DGI suggesting that patient has low risk of falls.    End of Session PT - End of Session Equipment Utilized During Treatment: Gait belt Activity Tolerance: Patient  tolerated treatment well Patient left: in chair;with call bell/phone within  reach;with family/visitor present Nurse Communication: Mobility status       INGOLD,Brysten Reister 10/09/2012, 11:27 AM  [facility] [phone] (pager)

## 2012-10-09 NOTE — Progress Notes (Signed)
Speech Language Pathology Dysphagia Treatment Patient Details Name: Daisy Larson MRN: [id number] DOB: 14-Oct-1951 Today's Date: 10/09/2012 Time: 4098-1191 SLP Time Calculation (min): 8 min  Assessment / Plan / Recommendation Clinical Impression  Pt. seen for dysphagia treatment focusing on education about reflux precautions. Pt. appears more alert and aware than in previous session. Oral and pharyngeal phase appear WFL with consistencies consumed, with no outward s/s of aspiration observed. SLP instructed pt. on strategies to reduce reflux related discomfort (slow rate, small bites and sips, sitting up right for meals, and staying upright 30 minutes post meals). Pt. and her husband were agreeable to recommendations. Recommend continuing regular texture diet with thin liquids. At this time, no further ST is warranted    Diet Recommendation  Continue with Current Diet: Regular;Thin liquid    SLP Plan Discharge SLP treatment due to (comment) (all goals met)      Swallowing Goals  SLP Swallowing Goals Patient will consume recommended diet without observed clinical signs of aspiration with: Independent assistance Swallow Study Goal #1 - Progress: Met Patient will utilize recommended strategies during swallow to increase swallowing safety with: Independent assistance Swallow Study Goal #2 - Progress: Met  General Temperature Spikes Noted: No Respiratory Status: Room air Behavior/Cognition: Alert;Cooperative;Pleasant mood Oral Cavity - Dentition: Adequate natural dentition Patient Positioning: Upright in bed  Oral Cavity - Oral Hygiene Does patient have any of the following "at risk" factors?: None of the above Brush patient's teeth BID with toothbrush (using toothpaste with fluoride): Yes   Dysphagia Treatment Treatment focused on: Skilled observation of diet tolerance;Patient/family/caregiver education Treatment Methods/Modalities: Skilled observation Patient observed directly  with PO's: Yes Type of PO's observed: Thin liquids;Regular Feeding: Able to feed self Liquids provided via: Cup;No straw Type of cueing: Verbal Amount of cueing: Minimal   GO     Daisy Larson 10/09/2012, 11:47 AM

## 2012-10-10 ENCOUNTER — Other Ambulatory Visit: Payer: Self-pay | Admitting: Oncology

## 2012-10-10 ENCOUNTER — Telehealth: Payer: Self-pay | Admitting: Pulmonary Disease

## 2012-10-10 ENCOUNTER — Telehealth: Payer: Self-pay | Admitting: *Deleted

## 2012-10-10 DIAGNOSIS — C50919 Malignant neoplasm of unspecified site of unspecified female breast: Secondary | ICD-10-CM

## 2012-10-10 LAB — CBC
Hemoglobin: 8.8 g/dL — ABNORMAL LOW (ref 12.0–15.0)
MCHC: 31.9 g/dL (ref 30.0–36.0)
MCHC: 31.8 g/dL (ref 30.0–36.0)
RDW: 14.8 % (ref 11.5–15.5)
RDW: 14.8 % (ref 11.5–15.5)
WBC: 3 10*3/uL — ABNORMAL LOW (ref 4.0–10.5)

## 2012-10-10 LAB — GLUCOSE, CAPILLARY
Glucose-Capillary: 129 mg/dL — ABNORMAL HIGH (ref 70–99)
Glucose-Capillary: 244 mg/dL — ABNORMAL HIGH (ref 70–99)

## 2012-10-10 MED ORDER — FERROUS SULFATE 325 (65 FE) MG PO TABS: 325.0000 mg | ORAL_TABLET | Freq: Three times a day (TID) | ORAL | Status: DC

## 2012-10-10 NOTE — Discharge Summary (Signed)
Physician Discharge Summary  Daisy Larson NWG:[id number] DOB: 26-Feb-1951 DOA: 10/07/2012  PCP: Maxine Glenn Carter,MD in [place], [place]  Admit date: 10/07/2012 Discharge date: 10/10/2012  Time spent: Greater than 30 minutes  Recommendations for Outpatient Follow-up:  1. With Dr. Cyril Mourning, Pulmonology on 10/15/2012 @ 1:45pm 2. With Dr. Ruthann Cancer, Oncology on 10/16/2012 @ 12:45 pm with repeat labs (CBC) 3. With [facility] at [facility] on 10/21/2012 @ 8:15pm for sleep study. 4. With Dr. Kirt Boys, PCP in 2 weeks from [facility] Discharge.  Discharge Diagnoses:  Active Problems:  DIABETES MELLITUS, TYPE II, UNCONTROLLED  HYPERLIPIDEMIA, MIXED, WITH HIGH HDL  Essential hypertension, benign  RESTLESS LEGS SYNDROME  Polyneuropathy in diabetes(357.2)  History of breast cancer in female  Syncope  Cirrhosis of liver- "juvenile"  Pica in adults  Anemia  Encephalopathy acute  OSA (obstructive sleep apnea)   Discharge Condition: Improved and stable  Diet recommendation:  Heart Healthy and Diabetic.  Filed Weights   10/07/12 1725  Weight: 116.3 kg (256 lb 6.3 oz)    History of present illness:  Daisy Larson is an 61 y.o. female who over the last two yeas to show increasing somnolence, decreased memory and periods of staring spells. These spells started after he husband had multiple cardiac issues and she became the sole provider. Over time family has noted while she is on the computer she will often daze off or breifly fall asleep. Initially she was easily awakened by calling her name. Recently she has been harder to arouse when she falls asleep. Patients daughter notes she dose drive and has never been in a car accident but often times will "daze off" and forget where she was going. Patient has never fallen or lost ability to keep herself upright. The episodes do not occur while walking. Until this hospitalization patient has had no jerking of arms or legs.  While in ED patients family has noted right arm tremor at times --both when alert and talking and during one episode in which she was non responsive. Eyes have not been noted to deviated.    Hospital Course:   Acute encephalopathy  Unclear etiology.  Workup including MRI of brain, CT head, EEG, blood studies unremarkable for cause.  Possibly multifactorial-OSA with mild hypercapnia, pain medications (patient took hydromorphone 10 mg x1 on night prior to admission-spouse's medication).  Resolved. Patient indicates that she has not taken Ativan in over a year.  Possible sleep apnea  Patient did drop oxygen saturations to high 70s on 12/11 early am. Per spouse, patient has history of snoring and breath holding spells at night. She has history of somnolence in a.m. and even while driving-does not drive anymore/family drives her around. Spouse and patient repeatedly indicated that patient does not drive. They were counseled that she should not drive until further out patient work up and clearance by her MD. Will need outpatient sleep study (arranged) and further evaluation. Arranged OP follow up with Pulmonology. Arranged for CPAP at bedtime- Auto titrate, 5-20 mmH2O, mask of choice and humidifier (per Pulmonary recommendations)  ?Syncope  Unclear etiology.  Workup inconclusive for etiology.  Neurology followup appreciated.  Cirrhosis of liver- "juvenile"  Followed by gastroenterologist in [facility]  Patient states was told had a slow progressive cirrhosis listed as juvenile was evaluated by [facility] hospital  LFTs only mildly elevated and a nonobstructive pattern and ammonia only mildly elevated at 61-patient was not on lactulose or other binding agent prior to admission  Systolic  murmur  Echo unremarkable.  DIABETES MELLITUS, TYPE II, UNCONTROLLED  Hemoglobin A1c 7.2  Utilizes a 70/25 self inject pen at home- continue. May need adjustment as OP. Resume Metformin  Essential hypertension,  benign  Current blood pressure moderately controlled  Resume home medications  Pica in adults/Anemia  Patient eats a large bag of ice daily  Anemia panel suggests iron deficiency. Will supplement by mouth on D/C.  Also reported that patient had issues with low B12 but currently is receiving injections and oral repletion as an outpatient. B12 levels look high. Follow up Oncology (arranged appointment)  HYPERLIPIDEMIA, MIXED, WITH HIGH HDL  Continue Pravachol  RESTLESS LEGS SYNDROME/Polyneuropathy in diabetes(357.2)  States medical therapy used prior to admission not helpful Continue Mirapex, Neurontin and magnesium as well as Cymbalta   TSH low normal with a low free T4  Outpatient followup with repeat TFTs in 4-6 weeks from discharge. Clinically appears euthyroid. No supplements at this time.   History of breast cancer in female  Continue Arimidex-less likely to have caused her admission presentation or cytopenia's. Discussed with her Oncologist.  Pancycytopenia  Unclear etiology Discussed with Dr. Darnelle Catalan and arranged for OP followup. Requested Peripheral smear  Consultants:  Neurology   Procedures:  None   Antibiotics:  None  Discharge Exam:  Complaints: Mild headache in morning- decreasing.  Filed Vitals:   10/09/12 1400 10/09/12 2053 10/10/12 0519 10/10/12 1330  BP: 118/72 158/78 128/67 147/63  Pulse: 72 67 66 69  Temp: 97.4 F (36.3 C) 97.4 F (36.3 C) 98.4 F (36.9 C) 97.6 F (36.4 C)  TempSrc: Oral Oral Oral Oral  Resp: 18 20 20 20   Height:      Weight:      SpO2: 98% 99% 98% 100%    General: Comfortable. Obesity  Lungs: Clear to auscultation bilaterally. No increased work of breathing Cardiovascular: Regular rate and rhythm without gallop or rub is have a soft systolic murmur but otherwise has normal S1 and S2, or JVD, no peripheral edema. Telemetry shows normal sinus rhythm.  Abdomen: Nontender, nondistended, soft, bowel sounds positive, no  rebound, no ascites, no appreciable mass  Extremities: Symmetrical 5 x 5 power Neurological: Alert and oriented. No focal neurological deficits  Discharge Instructions      Discharge Orders    Future Appointments: Provider: Department: Dept Phone: Center:   10/15/2012 1:45 PM [name], MD [facility] [place] [phone] None   10/16/2012 12:45 PM [facility] [phone] None   10/16/2012 1:15 PM Amy Allegra Grana, PA [facility] [phone] None     Future Orders Please Complete By Expires   Diet - low sodium heart healthy      Diet Carb Modified      Increase activity slowly      Call MD for:      Comments:   Any change in mental status.   Discharge instructions      Comments:   Continue nightly CPAP.       Medication List     As of 10/10/2012  3:51 PM    STOP taking these medications         LORazepam 2 MG tablet   Commonly known as: ATIVAN      TAKE these medications         anastrozole 1 MG tablet   Commonly known as: ARIMIDEX   Take 1 mg by mouth daily.  aspirin 81 MG tablet   Take 81 mg by mouth daily.      B-D UF III MINI PEN NEEDLES 31G X 5 MM Misc   Generic drug: Insulin Pen Needle      calcium-vitamin D 500-200 MG-UNIT per tablet   Commonly known as: OSCAL WITH D   Take 1 tablet by mouth daily.      cetirizine 10 MG tablet   Commonly known as: ZYRTEC   Take 10 mg by mouth daily.      Cholecalciferol 1000 UNITS capsule   Take 2,000 Units by mouth daily.      CINNAMON PO   Take 2 tablets by mouth daily.      DULoxetine 60 MG capsule   Commonly known as: CYMBALTA   Take 60 mg by mouth daily.      EQL OMEGA 3 FISH OIL 1400 MG Caps   Take 1 capsule by mouth daily.      ferrous sulfate 325 (65 FE) MG tablet   Take 1 tablet (325 mg total) by mouth 3 (three) times daily with meals.      gabapentin 800 MG tablet   Commonly known as: NEURONTIN    800 mg 3 (three) times daily.      GREEN TEA EXTRACT PO   Take 1 tablet by mouth daily. 400mg       insulin lispro protamine-insulin lispro (75-25) 100 UNIT/ML Susp   Commonly known as: HUMALOG 75/25   Inject 60-70 Units into the skin 2 (two) times daily with a meal. 70 units in the morning and 60 units in the evening      lisinopril 20 MG tablet   Commonly known as: PRINIVIL,ZESTRIL   Take 20 mg by mouth every morning. Take in the morning      Magnesium 400 MG Caps   Take 1 capsule by mouth daily.      meclizine 25 MG tablet   Commonly known as: ANTIVERT   Take 12.5 mg by mouth 3 (three) times daily as needed. vertigo      metFORMIN 500 MG 24 hr tablet   Commonly known as: GLUCOPHAGE-XR   Take 1,000 mg by mouth 2 (two) times daily.      MULTIPLE VITAMIN PO   Take 1 tablet by mouth daily.      omeprazole 40 MG capsule   Commonly known as: PRILOSEC   Take 40 mg by mouth daily.      ONE TOUCH ULTRA TEST test strip   Generic drug: glucose blood      ONETOUCH DELICA LANCETS Misc      OVER THE COUNTER MEDICATION   Take 1 tablet by mouth at bedtime as needed. "Prunelax"      OVER THE COUNTER MEDICATION   Take 1 tablet by mouth daily. "Grape seed and resveratrol"      pramipexole 1 MG tablet   Commonly known as: MIRAPEX   Take 1 mg by mouth at bedtime.      pravastatin 40 MG tablet   Commonly known as: PRAVACHOL   Take 40 mg by mouth daily.      propranolol ER 60 MG 24 hr capsule   Commonly known as: INDERAL LA   Take 60 mg by mouth daily. Take at night      SUPER B COMPLEX PO   Take 1 tablet by mouth daily.      vitamin B-12 1000 MCG tablet   Commonly known as: CYANOCOBALAMIN   Take 1,000  mcg by mouth daily.         Follow-up Information    Follow up with [name]., MD. On 10/15/2012. (1:45 pm)    Contact information:   [address] [place] [address]       Follow up with Lowella Dell, MD. On 10/16/2012. (12:45 pm. To be  seen with labs (CBC))    Contact information:   [address] [place] [address] [phone]       Follow up with [facility] at [facility] On 10/21/2012. (8:15 pm. For Sleep Study.)       Follow up with Dr. Kirt Boys, Primary Medical Doctor. Schedule an appointment as soon as possible for a visit in 2 weeks.          The results of significant diagnostics from this hospitalization (including imaging, microbiology, ancillary and laboratory) are listed below for reference.    Significant Diagnostic Studies: Ct Head Wo Contrast  10/07/2012  *RADIOLOGY REPORT*  Clinical Data: Mental status changes.  Somnolence.  CT HEAD WITHOUT CONTRAST  Technique:  Contiguous axial images were obtained from the base of the skull through the vertex without contrast.  Comparison: None.  Findings: The the brain has a normal appearance without evidence of malformation, atrophy, old or acute infarction, mass lesion, hemorrhage, hydrocephalus or extra-axial collection.  The calvarium is unremarkable.  Sinuses, middle ears and mastoids are clear.  IMPRESSION: Normal head CT   Original Report Authenticated By: Paulina Fusi, M.D.    Mr Laqueta Jean Wo Contrast  10/08/2012  *RADIOLOGY REPORT*  Clinical Data: Confusion with somnolence and breast cancer.  MRI HEAD WITHOUT AND WITH CONTRAST  Technique:  Multiplanar, multiecho pulse sequences of the brain and surrounding structures were obtained according to standard protocol without and with intravenous contrast  Contrast: 20mL MULTIHANCE GADOBENATE DIMEGLUMINE 529 MG/ML IV SOLN  Comparison: CT head 10/07/2012.  Findings: There is no evidence for acute infarction, intracranial hemorrhage, mass lesion, hydrocephalus, or extra-axial fluid.  Mild age related atrophy.  Minimal chronic microvascular ischemic change.  No significant lacunar infarcts.  No midline shift.  Flow voids are maintained in the major intracranial vessels.  No worrisome osseous  lesions or midline abnormality.  Post infusion, no abnormal enhancement of the brain or meninges.  No visible intracranial metastatic deposits.  Mild chronic sinus disease.  No mastoid fluid.  IMPRESSION: Mild age related atrophy and minimal small vessel disease.  No acute intracranial abnormality.  No abnormal enhancement to suggest metastatic disease. Similar appearance to prior CT.   Original Report Authenticated By: Davonna Belling, M.D.    Portable Chest 1 View  10/07/2012  *RADIOLOGY REPORT*  Clinical Data: Hypoxia.  History breast cancer.  Diabetic. Nonsmoker.  PORTABLE CHEST - 1 VIEW  Comparison: 08/22/2012.  Findings: Mild pulmonary edema.  No segmental consolidation or gross pneumothorax.  Heart appears slightly enlarged.  Mild tortuosity ascending thoracic aorta.  IMPRESSION: Mild pulmonary edema.  This is a call report.   Original Report Authenticated By: Lacy Duverney, M.D.    EEG IMPRESSION: This is an abnormal EEG secondary to posterior background slowing. This finding can be seen with a diffuse gray matter disturbance that is etiologically nonspecific, but may include a dementia among other possibilities. No epileptiform activity was noted.  2-D echo Study Conclusions  - Left ventricle: The cavity size was normal. There was mild concentric hypertrophy. Systolic function was normal. The estimated ejection fraction was in the range of 60% to  65%. Wall motion was normal; there were no regional wall motion abnormalities. - Left atrium: The atrium was moderately to severely dilated. - Right atrium: The atrium was mildly dilated.   Microbiology: Recent Results (from the past 240 hour(s))  MRSA PCR SCREENING     Status: Normal   Collection Time   10/07/12  5:27 PM      Component Value Range Status Comment   MRSA by PCR NEGATIVE  NEGATIVE Final      Labs: Basic Metabolic Panel:  Lab 10/08/12 1610 10/07/12 1746 10/07/12 1057  NA 140 -- 142  K 3.8 -- 4.3  CL 103 -- 105  CO2 29 -- 30   GLUCOSE 179* -- 240*  BUN 8 -- 9  CREATININE 0.55 0.62 0.66  CALCIUM 8.9 -- 9.5  MG -- -- --  PHOS -- -- --   Liver Function Tests:  Lab 10/08/12 0435 10/07/12 1057  AST 47* 56*  ALT 32 40*  ALKPHOS 131* 161*  BILITOT 0.5 0.4  PROT 6.3 7.3  ALBUMIN 2.8* 3.3*   No results found for this basename: LIPASE:5,AMYLASE:5 in the last 168 hours  Lab 10/07/12 1749  AMMONIA 61*   CBC:  Lab 10/10/12 0940 10/10/12 0445 10/08/12 0435 10/07/12 1746 10/07/12 1057  WBC 2.7* 3.0* 4.1 4.5 6.5  NEUTROABS -- -- -- -- --  HGB 9.5* 8.8* 9.8* 10.4* 10.9*  HCT 29.9* 27.6* 30.2* 33.1* 34.9*  MCV 84.5 83.9 86.0 86.9 86.4  PLT 81* 76* 90* 102* 130*   Cardiac Enzymes: No results found for this basename: CKTOTAL:5,CKMB:5,CKMBINDEX:5,TROPONINI:5 in the last 168 hours BNP: BNP (last 3 results) No results found for this basename: PROBNP:3 in the last 8760 hours CBG:  Lab 10/10/12 1139 10/10/12 0611 10/09/12 2055 10/09/12 1645 10/09/12 1143  GLUCAP 244* 129* 233* 158* 224*    Other lab data   ABG: PH 7.363, PCO2 52, PO2 78, bicarbonate 29.7 and oxygen saturation 95%  Anemia panel: Iron 64, total iron binding capacity 353, ferritin 12, folate greater than 20, B12 1465, Reticulocyte count 75.  Ammonia 61  Hemoglobin A1c: 7.2   TFTs: TSH 0.787, free T4 0.76 and free T3-2.6.  RPR: Neg  UA: Not suggestive of UTI   Signed:  Lea Walbert  [facility] 10/10/2012, 3:51 PM

## 2012-10-10 NOTE — Telephone Encounter (Signed)
error 

## 2012-10-10 NOTE — Telephone Encounter (Signed)
Gave patient appointment for 10-16-2012 starting at 12:45pm

## 2012-10-10 NOTE — Progress Notes (Signed)
I was called by Dr. Darlys Gales who tells me this patient was admitted yesterday after taking her husband's as I loaded. She did not immediately improved with Narcan, so she had a very full neurologic workup, which was positive for CO2 retention, consistent with sleep apnea. The patient has been set up for further evaluation of admission is being discharged today.  Dr. Waymon Amato is concerned that the patient has significant cytopenias, with low readings in all 3 cell lines. She is iron deficient and she will be started on an iron tablet at discharge. We have arranged for her to be seen on December 18, and we will review her blood film at that time. (I also suggested to Dr. Audley Hose allergy that he obtain a pathologist smear review today). If the patient does not tolerate oral iron we will give her ferritin and if that does not correct a cytopenias we will proceed to bone marrow biopsy.

## 2012-10-15 ENCOUNTER — Encounter: Payer: Self-pay | Admitting: Pulmonary Disease

## 2012-10-15 ENCOUNTER — Ambulatory Visit (INDEPENDENT_AMBULATORY_CARE_PROVIDER_SITE_OTHER): Payer: BC Managed Care – PPO | Admitting: Pulmonary Disease

## 2012-10-15 VITALS — BP 142/92 | HR 72 | Temp 98.0°F | Ht 67.0 in | Wt 260.0 lb

## 2012-10-15 DIAGNOSIS — G4733 Obstructive sleep apnea (adult) (pediatric): Secondary | ICD-10-CM

## 2012-10-15 DIAGNOSIS — G2581 Restless legs syndrome: Secondary | ICD-10-CM

## 2012-10-15 MED ORDER — FUROSEMIDE 20 MG PO TABS: ORAL_TABLET | ORAL | Status: DC

## 2012-10-15 NOTE — Assessment & Plan Note (Signed)
Given excessive daytime somnolence, narrow pharyngeal exam, witnessed apneas & loud snoring, obstructive sleep apnea is very likely & an overnight polysomnogram will be scheduled as a split study. The pathophysiology of obstructive sleep apnea , it's cardiovascular consequences & modes of treatment including CPAP were discused with the patient in detail & they evidenced understanding.  Lasix 20 mg daily x 5ds Please ask [facility] to send me copy of sleep study We can then make changes to cpap machine & get you humidifier Repeat thyroid testing in 6months with Dr Montez Morita

## 2012-10-15 NOTE — Patient Instructions (Addendum)
Lasix 20 mg daily x 5ds Please ask [facility] to send me copy of sleep study We can then make changes to cpap machine & get you humidifier Send in chip in 4 weeks so we can look at download Repeat thyroid testing in 6months with Dr Montez Morita

## 2012-10-15 NOTE — Progress Notes (Signed)
Subjective:    Patient ID: Daisy Larson, female    DOB: May 17, 1951, 61 y.o.   MRN: [id number]  HPI  61 y.o. female never smoker presents for evaluation of obstructive sleep apnea after recent hospitalization for acute hypercarbic respiratory failure.   She reports increasing somnolence, decreased memory and periods of staring spells over the last two years. Over time family has noted while she is on the computer she will often daze off or breifly fall asleep. Initially she was easily awakened by calling her name. Recently she has been harder to arouse when she falls asleep. She has never been in a car accident.  She tripped, fell and developed left shoulder pain. She took her husband's oxycodone 10 mg tablet and was unresponsive by morning requiring hospital admission. ABG was 7.36/52/78 and ammonia level was 61. She has a history of nonalcoholic cirrhosis. Treated checking movements in the ED and acute encephalopathy, Workup including MRI of brain, CT head, EEG, blood studies unremarkable for cause. Overnight oximetry showed drop oxygen saturations to high 70s on 12/11 early am. Per spouse, patient has history of snoring and breath holding spells at night. Outpatient sleep study was arranged with [facility] . She was discharged on autoCPAP at bedtime- Auto titrate, 5-20 mmH2O, mask of choice and humidifier admission chest x-ray showed mild edema . Echo showed normal LV function, normal RV function with dilated right atrium  Anemia panel suggested iron deficiency. She had issues with low B12 but currently is receiving injections and oral repletion as an outpatient. B12 levels were high. She is on Mirapex, Neurontin and magnesium for restless legs and diabetic neuropathy. TSH was low normal with a low free T4 and a normal free T3  Bedtime is not fixed but around 11 PM, sleep latency is a few minutes, she sleeps on her left side with one pillow. She is 2 awakenings including bathroom visits  without any post void sleep latency. She is out of bed at 8 AM feeling tired with dryness of mouth. She is tolerated CPAP well last 2 nights and wakes up rested. She's gained about 10 pounds in the last 2 years. Epworth sleepiness score is 19 or 24. There is no history suggestive of cataplexy, sleep paralysis or parasomnias She reports new onset pedal edema denies dyspnea, orthopnea paroxysmal nocturnal dyspnea. She has an appointment with her cardiologist soon.   Past Medical History  Diagnosis Date  . Cirrhosis   . Multiple thyroid nodules   . History of stomach ulcers   . Hernia   . Colon polyp   . Neuropathy of foot   . Restless legs   . Complication of anesthesia     WITH SECON HERNIA REPAIR 2012 [facility]  DIFFICULTY O2 SAT DROPPING ADMIT TO HOSPITAL   . Bundle branch block   . Hypertension   . Angina     COSTOCONDRITIS    . Shortness of breath     WITH EXERTION   . Diabetes mellitus   . Hepatitis      Did Not have hepatitis but needed Hep A and Hep B injections2012 SPOTS ON LIVER  DR Marcelene Butte  [phone]  . GERD (gastroesophageal reflux disease)     ULCERS  . Hyperthyroidism     NODULES ON THYROID  (DR BALEN 37  12-609)  . Goiter   . Headache     MIGRAINES  . Neuromuscular disorder     PERIPH NEUROPATHY   . Cancer     RIGHT  BREAST  . Anxiety   . Depression   . Breast cancer 01/19/12     right breast lumpectomy/ER/PR positibe,her-2 neg.  . Allergy     tape  . S/P radiation therapy 02/19/12 - 04/01/12    Right Breast: 5000 cgy/25 Fractions with Boost/ 1000 cGy/5 Fractions  . Iron deficiency anemia 10/16/2012    Past Surgical History  Procedure Date  . Appendectomy   . Tonsillectomy   . Cesarean section   . Tubal ligation   . Abdominal hysterectomy   . Back surgery   . Liver biopsy   . Hernia repair     X 2  . Anal fissure repair   . Carpal tunnel release     LEFT   . Colonoscopy w/ polypectomy   . Breast surgery     BIOPSY  Right Breast -  Sentinel  Lymph Nodes  . Back surgery   . Liver biopsy 03/28/11    [facility] - Cirrhosis  . Chronic hepatitis   . Thyroid nodules   . Spinal fusion     Allergies  Allergen Reactions  . Tape   . Oxymorphone     lathargic    History   Social History  . Marital Status: Married    Spouse Name: N/A    Number of Children: N/A  . Years of Education: N/A   Occupational History  . Not on file.   Social History Main Topics  . Smoking status: Never Smoker   . Smokeless tobacco: Not on file  . Alcohol Use: No  . Drug Use: No  . Sexually Active:    Other Topics Concern  . Not on file   Social History Narrative   Former Careers information officer. Married to [place] who is a Education officer, environmental and who underwent a Heart Transplant at [facility] in May 2011.  2 children and 1 grandchild.    family history includes Cancer in her maternal grandfather and paternal grandfather.    Review of Systems  Constitutional: Negative for fever, chills, diaphoresis, activity change, appetite change, fatigue and unexpected weight change.  HENT: Negative for hearing loss, ear pain, nosebleeds, congestion, sore throat, facial swelling, rhinorrhea, sneezing, mouth sores, trouble swallowing, neck pain, neck stiffness, dental problem, voice change, postnasal drip, sinus pressure, tinnitus and ear discharge.   Eyes: Negative for photophobia, discharge, itching and visual disturbance.  Respiratory: Positive for shortness of breath. Negative for apnea, cough, choking, chest tightness, wheezing and stridor.   Cardiovascular: Positive for palpitations. Negative for chest pain and leg swelling.  Gastrointestinal: Negative for nausea, vomiting, abdominal pain, constipation, blood in stool and abdominal distention.  Genitourinary: Negative for dysuria, urgency, frequency, hematuria, flank pain, decreased urine volume and difficulty urinating.  Musculoskeletal: Negative for myalgias, back pain, joint swelling, arthralgias and gait  problem.  Skin: Negative for color change, pallor and rash.  Neurological: Negative for dizziness, tremors, seizures, syncope, speech difficulty, weakness, light-headedness, numbness and headaches.  Hematological: Negative for adenopathy. Does not bruise/bleed easily.  Psychiatric/Behavioral: Negative for confusion, sleep disturbance and agitation. The patient is not nervous/anxious.        Objective:   Physical Exam  Gen. Pleasant, obese, in no distress, normal affect ENT - no lesions, no post nasal drip, class 2-3 airway Neck: No JVD, no thyromegaly, no carotid bruits Lungs: no use of accessory muscles, no dullness to percussion, decreased without rales or rhonchi  Cardiovascular: Rhythm regular, heart sounds  normal, no murmurs or gallops, no peripheral edema Abdomen: soft  and non-tender, no hepatosplenomegaly, BS normal. Musculoskeletal: No deformities, no cyanosis or clubbing Neuro:  alert, non focal, no tremors       Assessment & Plan:

## 2012-10-16 ENCOUNTER — Encounter: Payer: Self-pay | Admitting: Physician Assistant

## 2012-10-16 ENCOUNTER — Other Ambulatory Visit (HOSPITAL_BASED_OUTPATIENT_CLINIC_OR_DEPARTMENT_OTHER): Payer: BC Managed Care – PPO

## 2012-10-16 ENCOUNTER — Ambulatory Visit (HOSPITAL_COMMUNITY)
Admission: RE | Admit: 2012-10-16 | Discharge: 2012-10-16 | Disposition: A | Discharge status: Home / Self Care | Payer: BC Managed Care – PPO | Source: Ambulatory Visit | Attending: Physician Assistant | Admitting: Physician Assistant

## 2012-10-16 ENCOUNTER — Telehealth: Payer: Self-pay | Admitting: *Deleted

## 2012-10-16 ENCOUNTER — Ambulatory Visit (HOSPITAL_BASED_OUTPATIENT_CLINIC_OR_DEPARTMENT_OTHER): Payer: BC Managed Care – PPO | Admitting: Physician Assistant

## 2012-10-16 VITALS — BP 136/73 | HR 75 | Temp 98.3°F | Resp 20 | Ht 67.0 in | Wt 264.5 lb

## 2012-10-16 DIAGNOSIS — C50419 Malignant neoplasm of upper-outer quadrant of unspecified female breast: Secondary | ICD-10-CM

## 2012-10-16 DIAGNOSIS — Z170 Estrogen receptor positive status [ER+]: Secondary | ICD-10-CM

## 2012-10-16 DIAGNOSIS — M25519 Pain in unspecified shoulder: Secondary | ICD-10-CM | POA: Insufficient documentation

## 2012-10-16 DIAGNOSIS — C50911 Malignant neoplasm of unspecified site of right female breast: Secondary | ICD-10-CM

## 2012-10-16 DIAGNOSIS — D509 Iron deficiency anemia, unspecified: Secondary | ICD-10-CM

## 2012-10-16 DIAGNOSIS — C50919 Malignant neoplasm of unspecified site of unspecified female breast: Secondary | ICD-10-CM

## 2012-10-16 DIAGNOSIS — M25512 Pain in left shoulder: Secondary | ICD-10-CM

## 2012-10-16 HISTORY — DX: Iron deficiency anemia, unspecified: D50.9

## 2012-10-16 LAB — CHCC SMEAR

## 2012-10-16 LAB — CBC WITH DIFFERENTIAL/PLATELET
EOS%: 3.3 % (ref 0.0–7.0)
MCH: 27.7 pg (ref 25.1–34.0)
MCHC: 32.8 g/dL (ref 31.5–36.0)
MCV: 84.7 fL (ref 79.5–101.0)
MONO%: 11.5 % (ref 0.0–14.0)
RBC: 3.48 10*6/uL — ABNORMAL LOW (ref 3.70–5.45)
RDW: 15.9 % — ABNORMAL HIGH (ref 11.2–14.5)

## 2012-10-16 MED ORDER — ANASTROZOLE 1 MG PO TABS: 1.0000 mg | ORAL_TABLET | Freq: Every day | ORAL | Status: DC

## 2012-10-16 NOTE — Telephone Encounter (Signed)
Per staff message and POF I have scheduled appts.  JMW  

## 2012-10-16 NOTE — Assessment & Plan Note (Signed)
Continue Mirapex Treatment of iron deficiency

## 2012-10-16 NOTE — Telephone Encounter (Signed)
Left Shoulder xray today; [facility] next avail; 1 hr Feraheme injection 12/30 and 1/7; labs in Jan, 1 week before seeing GM  Sent michelle email to set up patient treatment

## 2012-10-16 NOTE — Progress Notes (Signed)
ID: Bryson Dames   DOB: [birth year]  MR#: [id number]  WUJ#:[id number]  HISTORY OF PRESENT ILLNESS: The patient had annual mammography 12/29/2011 at [facility], showing  an 11 mm focal nodular density in the upper outer quadrant of the right breast which had increased in size as compared to the prior study a year prior. Ultrasound showed no sonographic abnormality. 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 The patient underwent bilateral breast MRI 01/09/2012 showing a solitary enhancing mass in the lateral aspect of the right breast measuring 1.3 cm. There was no enlarged axillary or internal mammary adenopathy and no other abnormality was noted.  The patient was seen at the multidisciplinary breast cancer clinic 01/10/2012 with further treatments as detailed below  INTERVAL HISTORY: Niketa returns today with her husband [name] for followup of her right breast cancer. She continues on anastrozole daily with good tolerance. She's had no significant hot flashes. She has had some joint pain in her right breast, her left leg, and her left shoulder status post recent fall approximately 6 weeks ago. The wrist and leg has improved, but the pain in the left shoulder seems to be worse.  Interval history is also remarkable for recent hospitalization.  She became nonresponsive and a complete workup included a full neurologic evaluation, positive for CO2 retention consistent with sleep apnea.   REVIEW OF SYSTEMS:  Marie denies any fevers or chills. She's had no rashes or skin changes. She denies any abnormal bleeding. She's eating well and has no nausea, diarrhea, or constipation. She complains of fatigue. She has occasional headaches which have not changed. She has chronic lower back pain, but  no additional myalgias, arthralgias, or bony pain. No peripheral swelling. She has shortness of breath with exertion, but no increased cough and no chest pain.  A detailed review of systems is stable noncontributory.   PAST MEDICAL HISTORY: Past Medical History  Diagnosis Date  . Cirrhosis   . Multiple thyroid nodules   . History of stomach ulcers   . Hernia   . Colon polyp   . Neuropathy of foot   . Restless legs   . Complication of anesthesia     WITH SECON HERNIA REPAIR 2012 [facility]  DIFFICULTY O2 SAT DROPPING ADMIT TO HOSPITAL   . Bundle branch block   . Hypertension   . Angina     COSTOCONDRITIS    . Shortness of breath     WITH EXERTION   . Diabetes mellitus   . Hepatitis      Did Not have hepatitis but needed Hep A and Hep B injections2012 SPOTS ON LIVER  DR Marcelene Butte  [phone]  . GERD (gastroesophageal reflux disease)     ULCERS  . Hyperthyroidism     NODULES ON THYROID  (DR BALEN 37  12-609)  . Goiter   . Headache     MIGRAINES  . Neuromuscular disorder     PERIPH NEUROPATHY   . Cancer     RIGHT BREAST  . Anxiety   . Depression   . Breast cancer 01/19/12     right breast lumpectomy/ER/PR positibe,her-2 neg.  . Allergy     tape  . S/P radiation therapy 02/19/12 - 04/01/12    Right Breast: 5000 cgy/25 Fractions with Boost/ 1000 cGy/5 Fractions  . Iron deficiency anemia  10/16/2012  She underwent liver biopsy 03/28/2011, at [facility], showing evidence of early cirrhosis. There was mildly active chronic hepatitis (grade 1). She also has a history of thyroid nodules which have been stable on serial ultrasounds according to the patient  PAST SURGICAL HISTORY: Past Surgical History  Procedure Date  . Appendectomy   . Tonsillectomy   . Cesarean section   . Tubal ligation   . Abdominal hysterectomy   . Back surgery   . Liver biopsy   . Hernia repair     X 2  . Anal fissure repair   . Carpal tunnel release     LEFT   . Colonoscopy w/  polypectomy   . Breast surgery     BIOPSY  Right Breast -  Sentinel Lymph Nodes  . Back surgery   . Liver biopsy 03/28/11    [facility] - Cirrhosis  . Chronic hepatitis   . Thyroid nodules   . Spinal fusion     FAMILY HISTORY Family History  Problem Relation Age of Onset  . Cancer Maternal Grandfather     Lung Cancer  . Cancer Paternal Grandfather     Colon Cancer   the patient's father died at the age of 53, following a fall. The patient's mother died at the age of 54. 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 The patient had no brothers, 3 sisters, one of whom is present at the initial visit. There is no history of breast or ovarian cancer in the family, but one niece was diagnosed with colon cancer at the age of 43. Her paternal grandfather died from colon cancer. Her maternal grandfather died from lung cancer.  GYNECOLOGIC HISTORY: She had menarche at age 81. She went through menopause approximately age 61. She took hormone replacement on total recently. She is GX P2, first pregnancy to term age 35.  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 Her husband of 40+ years, [name], is a Education officer, environmental. He underwent heart transplant at [facility] at May 2011 and is doing very well except for pain from costochondritis. Their children are Jung Ingerson who lives in [place] and is a Chief Financial Officer, and [name],  also living in [place], who works as a Scientist, forensic. The patient has one grandchild. She attends an independent [employer]    ADVANCED DIRECTIVES: in place  HEALTH MAINTENANCE: History  Substance Use Topics  . Smoking status: Never Smoker   . Smokeless tobacco: Not on file  . Alcohol Use: No     Colonoscopy: 2011  PAP: 2012  Bone density: 2003  Lipid panel:  Allergies  Allergen Reactions  . Tape   . Oxymorphone     lathargic    Current Outpatient  Prescriptions  Medication Sig Dispense Refill  . anastrozole (ARIMIDEX) 1 MG tablet Take 1 tablet (1 mg total) by mouth daily.  90 tablet  3  . aspirin 81 MG tablet Take 81 mg by mouth daily.      . B Complex-C (SUPER B COMPLEX PO) Take 1 tablet by mouth daily.      . B-D UF III MINI PEN NEEDLES 31G X 5 MM MISC       . buPROPion (WELLBUTRIN XL) 300 MG 24 hr tablet Take 1 tablet by mouth Daily.      . calcium-vitamin D (OSCAL WITH D) 500-200 MG-UNIT per tablet Take 1 tablet by mouth  daily.      . cetirizine (ZYRTEC) 10 MG tablet Take 10 mg by mouth daily.      . Cholecalciferol 1000 UNITS capsule Take 2,000 Units by mouth daily.      Marland Kitchen CINNAMON PO Take 2 tablets by mouth daily.      . DULoxetine (CYMBALTA) 60 MG capsule Take 60 mg by mouth daily.      . ferrous sulfate 325 (65 FE) MG tablet Take 1 tablet (325 mg total) by mouth 3 (three) times daily with meals.  90 tablet  0  . furosemide (LASIX) 20 MG tablet Take one tablet daily for 5 days  5 tablet  0  . gabapentin (NEURONTIN) 800 MG tablet 800 mg 3 (three) times daily.       Chilton Si Tea, Camillia sinensis, (GREEN TEA EXTRACT PO) Take 1 tablet by mouth daily. 400mg       . insulin lispro protamine-insulin lispro (HUMALOG 75/25) (75-25) 100 UNIT/ML SUSP Inject 60-70 Units into the skin 2 (two) times daily with a meal. 70 units in the morning and 60 units in the evening      . lisinopril (PRINIVIL,ZESTRIL) 20 MG tablet Take 20 mg by mouth every morning. Take in the morning      . Magnesium 400 MG CAPS Take 1 capsule by mouth daily.      . meclizine (ANTIVERT) 25 MG tablet Take 12.5 mg by mouth 3 (three) times daily as needed. vertigo      . metFORMIN (GLUCOPHAGE-XR) 500 MG 24 hr tablet Take 1,000 mg by mouth 2 (two) times daily.       . MULTIPLE VITAMIN PO Take 1 tablet by mouth daily.      . Omega-3 Fatty Acids (EQL OMEGA 3 FISH OIL) 1400 MG CAPS Take 1 capsule by mouth daily.      Marland Kitchen omeprazole (PRILOSEC) 40 MG capsule Take 40 mg by mouth  daily.      Marland Kitchen OVER THE COUNTER MEDICATION Take 1 tablet by mouth daily. "Grape seed and resveratrol"      . pramipexole (MIRAPEX) 1 MG tablet Take 1 mg by mouth at bedtime.      . pravastatin (PRAVACHOL) 40 MG tablet Take 40 mg by mouth daily.       . propranolol (INDERAL LA) 60 MG 24 hr capsule Take 60 mg by mouth daily. Take at night      . vitamin B-12 (CYANOCOBALAMIN) 1000 MCG tablet Take 1,000 mcg by mouth daily.        OBJECTIVE: Middle-aged white woman in no acute distress Filed Vitals:   10/16/12 1342  BP: 136/73  Pulse: 75  Temp: 98.3 F (36.8 C)  Resp: 20     Body mass index is 41.43 kg/(m^2).    ECOG FS: 1 Filed Weights   10/16/12 1342  Weight: 264 lb 8 oz (119.976 kg)   Sclerae unicteric No peripheral adenopathy Lungs no rales or rhonchi Heart regular rate and rhythm Abd obese, soft and nontender with positive bowel sounds MSK no focal spinal tenderness, range of motion is somewhat limited in the left shoulder secondary to discomfort with movement No peripheral edema Neuro: nonfocal, alert and oriented x3 Breasts: The right breast is status post lumpectomy and radiation. No evidence of local recurrence. The left breast is unremarkable. Axillae benign bilaterally with no adenopathy palpated.  LAB RESULTS: Lab Results  Component Value Date   WBC 3.8* 10/16/2012   NEUTROABS 2.3 10/16/2012   HGB 9.6* 10/16/2012  HCT 29.4* 10/16/2012   MCV 84.7 10/16/2012   PLT 90* 10/16/2012      Chemistry      Component Value Date/Time   NA 140 10/08/2012 0435   NA 140 07/03/2012 0852   K 3.8 10/08/2012 0435   K 4.0 07/03/2012 0852   CL 103 10/08/2012 0435   CL 103 07/03/2012 0852   CO2 29 10/08/2012 0435   CO2 27 07/03/2012 0852   BUN 8 10/08/2012 0435   BUN 9.0 07/03/2012 0852   CREATININE 0.55 10/08/2012 0435   CREATININE 0.8 07/03/2012 0852      Component Value Date/Time   CALCIUM 8.9 10/08/2012 0435   ALKPHOS 131* 10/08/2012 0435   ALKPHOS 154* 07/03/2012 0852   AST 47*  10/08/2012 0435   AST 59* 07/03/2012 0852   ALT 32 10/08/2012 0435   ALT 45 07/03/2012 0852   BILITOT 0.5 10/08/2012 0435   BILITOT 0.60 07/03/2012 0852     Ferritin  12 10/08/2012  Lab Results  Component Value Date   LABCA2 25 01/10/2012     STUDIES: Bone density at [facility] 04/04/2012 showed a T score at the left femoral neck of -1.9.  ASSESSMENT: 61 year [place]   (1) status post right lumpectomy 01/19/2012 for a T1c N0, stage IA invasive mucinous carcinoma, grade 1, estrogen 99% and progesterone 100% receptor positive, with an MIB of 8, and no HER-2 amplification.  (2) completed radiation 04/01/2012  (3) on anastrozole since June 2013  (4)  iron deficiency anemia  PLAN:  This case was reviewed with Dr. Darnelle Catalan today.  With regards to her breast cancer, Ilithyia is actually doing quite well. She will continue on the anastrozole, and in fact I have refilled that for her today.  As noted above, the ferritin level drawn during her hospitalization on 10/08/2012 was low at 12, and her hemoglobin remains low as well. We will initiate iron supplementation and she will receive 2 weekly doses of Ferriheme on December 30 and January 7. She'll then return in late January for repeat labs and followup exam.  In the meanwhile, we'll also obtain a left shoulder x-ray to evaluate the shoulder pain she is having, and we'll refer her for further evaluation in [facility] orthopedics. She'll continue to followup with her other physicians as well, and we'll keep the appointment for her sleep study next week.  Aleaha Fickling    10/16/2012

## 2012-10-21 ENCOUNTER — Telehealth: Payer: Self-pay | Admitting: Pulmonary Disease

## 2012-10-21 NOTE — Telephone Encounter (Signed)
lmomtcb x1 for pt on both #;s. Will also forward to PCC's to see if they know anything in reference to pt.

## 2012-10-21 NOTE — Telephone Encounter (Signed)
Pt called to let us know that she is doing a [email or web address]  sleep 11/06/12 and they will forward a copy to dr Vassie Loll Janus Molder

## 2012-10-28 ENCOUNTER — Ambulatory Visit (HOSPITAL_BASED_OUTPATIENT_CLINIC_OR_DEPARTMENT_OTHER): Payer: BC Managed Care – PPO

## 2012-10-28 VITALS — BP 158/74 | HR 72 | Temp 97.1°F | Resp 20

## 2012-10-28 DIAGNOSIS — D509 Iron deficiency anemia, unspecified: Secondary | ICD-10-CM

## 2012-10-28 MED ORDER — FERUMOXYTOL INJECTION 510 MG/17 ML
510.0000 mg | Freq: Once | INTRAVENOUS | Status: AC
  Administered 2012-10-28: 510 mg via INTRAVENOUS
  Filled 2012-10-28: qty 17

## 2012-10-28 MED ORDER — SODIUM CHLORIDE 0.9 % IV SOLN
Freq: Once | INTRAVENOUS | Status: AC
  Administered 2012-10-28: 15:00:00 via INTRAVENOUS

## 2012-10-28 NOTE — Patient Instructions (Addendum)
Ferumoxytol injection What is this medicine? FERUMOXYTOL is an iron complex. Iron is used to make healthy red blood cells, which carry oxygen and nutrients throughout the body. This medicine is used to treat iron deficiency anemia in people with chronic kidney disease. This medicine may be used for other purposes; ask your health care provider or pharmacist if you have questions. What should I tell my health care provider before I take this medicine? They need to know if you have any of these conditions: -anemia not caused by low iron levels -high levels of iron in the blood -magnetic resonance imaging (MRI) test scheduled -an unusual or allergic reaction to iron, other medicines, foods, dyes, or preservatives -pregnant or trying to get pregnant -breast-feeding How should I use this medicine? This medicine is for infusion into a vein. It is given by a health care professional in a hospital or clinic setting. Talk to your pediatrician regarding the use of this medicine in children. Special care may be needed. Overdosage: If you think you've taken too much of this medicine contact a poison control center or emergency room at once. Overdosage: If you think you have taken too much of this medicine contact a poison control center or emergency room at once. NOTE: This medicine is only for you. Do not share this medicine with others. What if I miss a dose? It is important not to miss your dose. Call your doctor or health care professional if you are unable to keep an appointment. What may interact with this medicine? This medicine may interact with the following medications: -other iron products This list may not describe all possible interactions. Give your health care provider a list of all the medicines, herbs, non-prescription drugs, or dietary supplements you use. Also tell them if you smoke, drink alcohol, or use illegal drugs. Some items may interact with your medicine. What should I watch  for while using this medicine? Visit your doctor or healthcare professional regularly. Tell your doctor or healthcare professional if your symptoms do not start to get better or if they get worse. You may need blood work done while you are taking this medicine. You may need to follow a special diet. Talk to your doctor. Foods that contain iron include: whole grains/cereals, dried fruits, beans, or peas, leafy green vegetables, and organ meats (liver, kidney). What side effects may I notice from receiving this medicine? Side effects that you should report to your doctor or health care professional as soon as possible: -allergic reactions like skin rash, itching or hives, swelling of the face, lips, or tongue -breathing problems -changes in blood pressure -feeling faint or lightheaded, falls -fever or chills -flushing, sweating, or hot feelings -swelling of the ankles or feet Side effects that usually do not require medical attention (Report these to your doctor or health care professional if they continue or are bothersome.): -diarrhea -headache -nausea, vomiting -stomach pain This list may not describe all possible side effects. Call your doctor for medical advice about side effects. You may report side effects to FDA at 1-800-FDA-1088. Where should I keep my medicine? This drug is given in a hospital or clinic and will not be stored at home. NOTE: This sheet is a summary. It may not cover all possible information. If you have questions about this medicine, talk to your doctor, pharmacist, or health care provider.  2012, Elsevier/Gold Standard. (07/08/2008 9:48:25 PM) 

## 2012-10-29 ENCOUNTER — Encounter: Payer: Self-pay | Admitting: Gastroenterology

## 2012-10-29 ENCOUNTER — Telehealth: Payer: Self-pay | Admitting: Gastroenterology

## 2012-10-29 ENCOUNTER — Other Ambulatory Visit: Payer: Self-pay | Admitting: *Deleted

## 2012-10-29 NOTE — Progress Notes (Signed)
Message left by pt's dtr stating need to be seen at [facility] ASAP due to onset of " bleeding from her rectum last night ". " she had a severe episode last night but so far today has not had any rectal bleeding ". Per message dtr states pt is scheduled to be seen on 1/16 " but they couldn't reschedule her any sooner and said if your office called they may be able to get her in before then ".  This RN spoke with pt who states history of hemorrhoids. Bleeding last night was more profuse- " in the toilet and on the outside of my buttocks". Per conversation pt has small episode of rectal bleeding x 1 post above call. This RN informed pt request to [facility] will be made but she should proceed to the ER if bleeding continues due to concern for more urgent care related to a life threatening event/ Pt verbalized understanding.  This RN called to [employer] per above and was informed request will be forwarded to RN and " she will call you " this RN's direct number left for contact.

## 2012-10-30 DIAGNOSIS — G510 Bell's palsy: Secondary | ICD-10-CM

## 2012-10-30 HISTORY — DX: Bell's palsy: G51.0

## 2012-10-31 NOTE — Telephone Encounter (Signed)
lmom for Val at [facility] of pt's appt; she may call back for questions.

## 2012-11-01 ENCOUNTER — Telehealth: Payer: Self-pay | Admitting: *Deleted

## 2012-11-01 NOTE — Telephone Encounter (Signed)
This RN called pt per message from GI at [facility] and informed of appointment for this Monday at 830am with request to arrive a little early for completion of new patient data. Daisy Larson verbalized understanding.

## 2012-11-04 ENCOUNTER — Other Ambulatory Visit (INDEPENDENT_AMBULATORY_CARE_PROVIDER_SITE_OTHER): Payer: BC Managed Care – PPO

## 2012-11-04 ENCOUNTER — Encounter: Payer: Self-pay | Admitting: Physician Assistant

## 2012-11-04 ENCOUNTER — Ambulatory Visit (INDEPENDENT_AMBULATORY_CARE_PROVIDER_SITE_OTHER): Payer: BC Managed Care – PPO | Admitting: Physician Assistant

## 2012-11-04 VITALS — BP 136/80 | HR 74 | Ht 67.0 in | Wt 257.0 lb

## 2012-11-04 DIAGNOSIS — Z803 Family history of malignant neoplasm of breast: Secondary | ICD-10-CM

## 2012-11-04 DIAGNOSIS — I517 Cardiomegaly: Secondary | ICD-10-CM

## 2012-11-04 DIAGNOSIS — R160 Hepatomegaly, not elsewhere classified: Secondary | ICD-10-CM

## 2012-11-04 DIAGNOSIS — D649 Anemia, unspecified: Secondary | ICD-10-CM

## 2012-11-04 LAB — COMPREHENSIVE METABOLIC PANEL
Alkaline Phosphatase: 136 U/L — ABNORMAL HIGH (ref 39–117)
BUN: 10 mg/dL (ref 6–23)
Glucose, Bld: 334 mg/dL — ABNORMAL HIGH (ref 70–99)
Total Bilirubin: 1.1 mg/dL (ref 0.3–1.2)

## 2012-11-04 LAB — PROTIME-INR
INR: 1.1 ratio — ABNORMAL HIGH (ref 0.8–1.0)
Prothrombin Time: 11.4 s (ref 10.2–12.4)

## 2012-11-04 NOTE — Patient Instructions (Addendum)
Please go to the basement level to have your labs drawn.  We are writing letters to Dr. Chinita Pester, and Dr. Akbar/Cardiology regarding the Endoscopy/Colonoscopy . Once we have clearance from them, we will call you with a date for the Endoscopic procedures.  I will then have you come in the office and I will explain the prep and have you sign the appropriate paperwork.  We are looking at the end of January.  The procedures will be done at the [facility].    Lowry Ram CMA [phone] Choose option 2 for nurse and ask for Lativia Velie.    You have been scheduled for a CT scan of the abdomen and pelvis at [facility] CT ([address]---this is in the same building as Architectural technologist).   You are scheduled on Friday 11-08-2012 at 10 AM . You should arrive at 9:45 Am prior to your appointment time for registration. Please follow the written instructions below on the day of your exam:  WARNING: IF YOU ARE ALLERGIC TO IODINE/X-RAY DYE, PLEASE NOTIFY RADIOLOGY IMMEDIATELY AT [phone]! YOU WILL BE GIVEN A 13 HOUR PREMEDICATION PREP.  1) Do not eat or drink anything after 6:00 AM  (4 hours prior to your test) 2) You have been given 2 bottles of oral contrast to drink. The solution may taste  better if refrigerated, but do NOT add ice or any other liquid to this solution. Shake well before drinking.    Drink 1 bottle of contrast @ 8 AM (2 hours prior to your exam)  Drink 1 bottle of contrast @ 9 AM  (1 hour prior to your exam)  You may take any medications as prescribed with a small amount of water except for the following: Metformin, Glucophage, Glucovance, Avandamet, Riomet, Fortamet, Actoplus Met, Janumet, Glumetza or Metaglip. The above medications must be held the day of the exam AND 48 hours after the exam.  The purpose of you drinking the oral contrast is to aid in the visualization of your intestinal tract. The contrast solution may cause some diarrhea.  Before your exam is started, you will be given a small amount of fluid to drink. Depending on your individual set of symptoms, you may also receive an intravenous injection of x-ray contrast/dye. Plan on being at [facility] for 30 minutes or long, depending on the type of exam you are having performed.  If you have any questions regarding your exam or if you need to reschedule, you may call the CT department at [phone] between the hours of 8:00 am and 5:00 pm, Monday-Friday.  ________________________________________________________________________

## 2012-11-04 NOTE — Progress Notes (Signed)
Cases will definitely need in hosp care.Marland KitchenMarland Kitchen

## 2012-11-04 NOTE — Progress Notes (Signed)
Subjective:    Patient ID: Daisy Larson, female    DOB: [birth year], 61 y.o.   MRN: [id number]  HPI  Daisy Larson is a very nice 62 year old white female known to Dr. Jarold Motto with last colonoscopy done in November of 2011. 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 Path on the polyps returned showing 3 tubular adenomas . She comes in today for evaluation of recent episodes of rectal bleeding. She states that she's had 3 distinct episodes of bright red blood per to with a bowel movement and has passed a lot of bright red blood into the commode without clots or associated melena these have occurred over the past 6-8 weeks. Between these episodes she says her bowels have been normal and she has not noted any melena or hematochezia. She says generally she has 2-3 bowel movements per day and that is normal for her she has not noted any recent constipation increased raining etc. does note some cramping in her lower abdomen just been mild . He has no history of hemorrhoids . She denies any rectal discomfort. She is on one baby aspirin daily no other blood thinners. Her past medical history is complicated she has history of breast cancer for which she underwent resection then chemotherapy and radiation completing in May of 2013. Since then she has been noted to be anemic in last hemoglobin when checked on 10/28/2012 was 9.6. She is also a also mildly iron deficient and has been receiving feraheme  infusions over the past month . She also has history of hypertension diabetes obesity and hyperthyroidism. She had a recent hospitalization with an acute encephalopathy and is undergoing workup with pulmonary and cardiology. Apparently she had an elevated CO2 level and it is felt she may have an underlying severe sleep apnea. He is scheduled for  sleep study later this week. She also had an echo done that showed left atrial enlargement, and an EF of 60-65%. Has an appointment with her cardiologist Dr. Concha Norway, at [facility] this week also. Also listed and patient's history is diagnosis of cirrhosis, neither the patient or her husband are aware of much detail of this diagnosis. Apparently she had workup at [facility] about a year and a half ago when she was found to have a nodule or liver at the time of a hernia repair. She was then referred to a physician at [facility] and was told that she had early cirrhosis. No specific etiology was given. She has not had any screening endoscopy, also note that she had an elevated venous ammonia level when she was admitted with her recent episode of encephalopathy.    Review of Systems  Constitutional: Negative.   Eyes: Negative.   Respiratory: Negative.   Cardiovascular: Negative.   Gastrointestinal: Positive for blood in stool.  Genitourinary: Negative.   Musculoskeletal: Negative.   Neurological: Negative.   Hematological: Negative.   Psychiatric/Behavioral: Negative.    Outpatient Prescriptions Prior to Visit  Medication Sig Dispense Refill  . anastrozole (ARIMIDEX) 1 MG tablet Take 1 tablet (1 mg total) by mouth daily.  90 tablet  3  . aspirin 81 MG tablet Take 81 mg by mouth daily.      . B Complex-C (SUPER B COMPLEX PO) Take 1 tablet by mouth daily.      Marland Kitchen B-D  UF III MINI PEN NEEDLES 31G X 5 MM MISC       . buPROPion (WELLBUTRIN XL) 300 MG 24 hr tablet Take 1 tablet by mouth Daily.      . calcium-vitamin D (OSCAL WITH D) 500-200 MG-UNIT per tablet Take 1 tablet by mouth daily.      . cetirizine (ZYRTEC) 10 MG tablet Take 10 mg by mouth daily.      . Cholecalciferol 1000 UNITS capsule Take 2,000 Units by mouth daily.      Marland Kitchen CINNAMON PO Take 2 tablets by mouth daily.      . DULoxetine (CYMBALTA) 60 MG capsule Take 60 mg by mouth daily.      . ferrous sulfate 325 (65 FE) MG  tablet Take 1 tablet (325 mg total) by mouth 3 (three) times daily with meals.  90 tablet  0  . gabapentin (NEURONTIN) 800 MG tablet 800 mg 3 (three) times daily.       Chilton Si Tea, Camillia sinensis, (GREEN TEA EXTRACT PO) Take 1 tablet by mouth daily. 400mg       . insulin lispro protamine-insulin lispro (HUMALOG 75/25) (75-25) 100 UNIT/ML SUSP Inject 60-70 Units into the skin 2 (two) times daily with a meal. 70 units in the morning and 60 units in the evening      . lisinopril (PRINIVIL,ZESTRIL) 20 MG tablet Take 20 mg by mouth every morning. Take in the morning      . Magnesium 400 MG CAPS Take 1 capsule by mouth daily.      . meclizine (ANTIVERT) 25 MG tablet Take 12.5 mg by mouth 3 (three) times daily as needed. vertigo      . metFORMIN (GLUCOPHAGE-XR) 500 MG 24 hr tablet Take 1,000 mg by mouth 2 (two) times daily.       . MULTIPLE VITAMIN PO Take 1 tablet by mouth daily.      . Omega-3 Fatty Acids (EQL OMEGA 3 FISH OIL) 1400 MG CAPS Take 1 capsule by mouth daily.      Marland Kitchen omeprazole (PRILOSEC) 40 MG capsule Take 40 mg by mouth daily.      Marland Kitchen OVER THE COUNTER MEDICATION Take 1 tablet by mouth daily. "Grape seed and resveratrol"      . pramipexole (MIRAPEX) 1 MG tablet Take 1 mg by mouth at bedtime.      . pravastatin (PRAVACHOL) 40 MG tablet Take 40 mg by mouth daily.       . propranolol (INDERAL LA) 60 MG 24 hr capsule Take 60 mg by mouth daily. Take at night      . vitamin B-12 (CYANOCOBALAMIN) 1000 MCG tablet Take 1,000 mcg by mouth daily.      . [DISCONTINUED] furosemide (LASIX) 20 MG tablet Take one tablet daily for 5 days  5 tablet  0   Last reviewed on 11/04/2012 12:07 PM by Sammuel Cooper, PA  Allergies  Allergen Reactions  . Tape   . Oxymorphone     lethargic   Patient Active Problem List  Diagnosis  . DIABETES MELLITUS, TYPE II, UNCONTROLLED  . HYPERLIPIDEMIA, MIXED, WITH HIGH HDL  . Essential hypertension, benign  . PERSONAL HISTORY OF COLONIC POLYPS  . DIAB W/NEURO  MANIFESTS TYPE II/UNS NOT UNCNTRL  . RESTLESS LEGS SYNDROME  . Polyneuropathy in diabetes(357.2)  . Cancer of upper-outer quadrant of female breast  . History of breast cancer in female  . Syncope  . Cirrhosis of liver- "juvenile"  . Pica in adults  .  Anemia  . Encephalopathy acute  . OSA (obstructive sleep apnea)  . Iron deficiency anemia   History  Substance Use Topics  . Smoking status: Never Smoker   . Smokeless tobacco: Never Used  . Alcohol Use: No    family history includes Colon cancer in her paternal grandfather; Diabetes in her mother; Heart disease in her maternal uncle; and Lung cancer in her maternal grandfather.     Objective:   Physical Exam well-developed older white female, obese, pleasant in no acute distress accompanied by her husband. Blood pressure 136/80 pulse 74 height 5 foot 7 weight 257 (BMI 40). HEENT; nontraumatic normocephalic EOMI PERRLA sclera anicteric conjunctiva somewhat pale,Neck; Supple no JVD, Cardiovascular; regular rate and rhythm with S1-S2 soft systolic murmur, Pulmonary; clear bilaterally, Abdomen; obese soft nontender liver is enlarged and palpable 4 fingerbreadths below the right costal margin across unable to palpate spleen, bowel sounds are active, Rectal exam ;small external skin tags on anoscopy she has small internal hemorrhoids with no evidence of edema or ulceration stool is brown and trace heme positive, Extremities; no clubbing cyanosis or edema skin warm and dry, Psych; mood and affect normal and appropriate.        Assessment & Plan:  #44 62 year old female with multiple medical problems with 3 recent episodes of gross hematochezia, and new diagnosis of iron deficiency anemia. Etiology of her bleeding is not clear, this may have been secondary to internal hemorrhoids or self-limited diverticular bleeding though that would be unusual. Cannot rule out occult colon lesion. #2 positive family history of colon cancer #3 personal history  of multiple colon polyps on colonoscopy November 2011, which were tubular adenomas, and fair exam secondary to tortuous redundant colon #4 recent admission for "encephalopathy", and elevated CO2 level-undergoing workup for sleep apnea currently, also noted to have significant left atrial enlargement and cardiac evaluation is in progress as well. #5 prior diagnosis of "early cirrhosis'-this may be secondary to [place], and concern that she has had progression of her liver disease with evidence for hepatomegaly on exam, and recent admission with the encephalopathy raises possibility of an underlying hepatic encephalopathy. #6  DM #7 HTN #8 personal history of breast cancer Plan; patient will need colonoscopy and also should have screening endoscopy given  a diagnosis of cirrhosis.. Procedure will need to be scheduled at the hospital with monitored anesthesia. We will need clearance from pulmonary and cardiology  prior to proceeding with sedation. Will hold on scheduling until cardiac and pulmonary workup is completed Will check CBC today, CMET, venous ammonia and pro time. Schedule for CT scan of the abdomen and pelvis for further assessment of cirrhosis

## 2012-11-05 ENCOUNTER — Ambulatory Visit (HOSPITAL_BASED_OUTPATIENT_CLINIC_OR_DEPARTMENT_OTHER): Payer: BC Managed Care – PPO

## 2012-11-05 ENCOUNTER — Other Ambulatory Visit: Payer: BC Managed Care – PPO | Admitting: Lab

## 2012-11-05 VITALS — BP 162/82 | HR 79 | Temp 97.9°F | Resp 20

## 2012-11-05 DIAGNOSIS — D509 Iron deficiency anemia, unspecified: Secondary | ICD-10-CM

## 2012-11-05 MED ORDER — SODIUM CHLORIDE 0.9 % IV SOLN
Freq: Once | INTRAVENOUS | Status: AC
  Administered 2012-11-05: 16:00:00 via INTRAVENOUS

## 2012-11-05 MED ORDER — FERUMOXYTOL INJECTION 510 MG/17 ML
510.0000 mg | Freq: Once | INTRAVENOUS | Status: AC
  Administered 2012-11-05: 510 mg via INTRAVENOUS
  Filled 2012-11-05: qty 17

## 2012-11-05 NOTE — Patient Instructions (Signed)
Patient aware of next appointment; discharged home with no complaints. 

## 2012-11-08 ENCOUNTER — Inpatient Hospital Stay: Admission: RE | Admit: 2012-11-08 | Payer: BC Managed Care – PPO | Source: Ambulatory Visit

## 2012-11-11 ENCOUNTER — Ambulatory Visit (INDEPENDENT_AMBULATORY_CARE_PROVIDER_SITE_OTHER)
Admission: RE | Admit: 2012-11-11 | Discharge: 2012-11-11 | Disposition: A | Discharge status: Home / Self Care | Payer: BC Managed Care – PPO | Source: Ambulatory Visit | Attending: Physician Assistant | Admitting: Physician Assistant

## 2012-11-11 ENCOUNTER — Telehealth: Payer: Self-pay | Admitting: Pulmonary Disease

## 2012-11-11 ENCOUNTER — Telehealth: Payer: Self-pay | Admitting: *Deleted

## 2012-11-11 DIAGNOSIS — G4733 Obstructive sleep apnea (adult) (pediatric): Secondary | ICD-10-CM

## 2012-11-11 DIAGNOSIS — I517 Cardiomegaly: Secondary | ICD-10-CM

## 2012-11-11 DIAGNOSIS — D649 Anemia, unspecified: Secondary | ICD-10-CM

## 2012-11-11 DIAGNOSIS — Z803 Family history of malignant neoplasm of breast: Secondary | ICD-10-CM

## 2012-11-11 DIAGNOSIS — R160 Hepatomegaly, not elsewhere classified: Secondary | ICD-10-CM

## 2012-11-11 MED ORDER — IOHEXOL 300 MG/ML  SOLN
100.0000 mL | Freq: Once | INTRAMUSCULAR | Status: AC | PRN
  Administered 2012-11-11: 100 mL via INTRAVENOUS

## 2012-11-11 NOTE — Telephone Encounter (Signed)
I spoke with Pam. They are needing clearance form RA to schedule colonoscopy. They are wanting to schedule late January or early February. Please advise Dr. Vassie Loll thanks

## 2012-11-11 NOTE — Telephone Encounter (Signed)
Mindy, Dr. Reginia Naas nurse called today, 11-11-2012.  She said she will get our colonoscopy clearance letter to Dr. Vassie Loll. He is working the hospital this week.  She said she would get back to me.

## 2012-11-11 NOTE — Telephone Encounter (Signed)
They have to wait until FU visit Pl obtain PSG results from [facility] if she had this done

## 2012-11-12 ENCOUNTER — Ambulatory Visit: Payer: BC Managed Care – PPO | Admitting: Oncology

## 2012-11-12 ENCOUNTER — Telehealth: Payer: Self-pay | Admitting: *Deleted

## 2012-11-12 NOTE — Telephone Encounter (Signed)
Informed pt we are still waiting on Sleep Study results in order for Dr Vassie Loll to give clearance for ECL. She is aware of results. She is still waiiing on a custom fitted mask for CPAP and states she's some better; she hasn't passed out anymore and she hasn't had any more headaches. She denies any bleeding since she saw Mike Gip, PA on 11/04/12. Informed pt if she has not heard from [place] or Dr Vassie Loll by 11/14/12 to call either office and she stated understanding.

## 2012-11-12 NOTE — Telephone Encounter (Signed)
Mindy from Dr. Reginia Naas office called me.  She said they have requested the Sleep Study results from [facility].  Once they have them, Dr. Vassie Loll wants the pt to come in for an office visit.  After that Mindy will call me regarding clearance for the colonoscopy.

## 2012-11-12 NOTE — Telephone Encounter (Signed)
Message copied by Florene Glen on Tue Nov 12, 2012  1:42 PM ------      Message from: [place], [place] S      Created: Tue Nov 12, 2012  1:16 PM       Please call pt and let her know the ct scan shows obvious cirrhosis- so she will need further evaluation- will still want to do EGD and Colon ? First once she gets clearance. See if she is still bleeding

## 2012-11-12 NOTE — Telephone Encounter (Signed)
I called [facility] to get results. [phone]. They will send results over to triage fax #. Will await await results. I called and also made Pam aware of this.

## 2012-11-12 NOTE — Telephone Encounter (Signed)
Results received and placed in RA look at. Please advise Dr. Vassie Loll thanks

## 2012-11-12 NOTE — Telephone Encounter (Signed)
Pt's spouse has called in & would like to get the results from the sleep study completed @ [facility] in [place].  Pt & her husband can be reached at [phone].  [name]

## 2012-11-12 NOTE — Telephone Encounter (Signed)
Duplicate encounter

## 2012-11-12 NOTE — Telephone Encounter (Signed)
I spoke with Pt. She is aware we just received results and will call once RA reviews them. She voiced her understanding. Please advise Dr. Vassie Loll thanks

## 2012-11-13 NOTE — Telephone Encounter (Signed)
Pt's daughter states that someone from this office attempted to reach pt this AM.  Advised that I did not see anything documented regarding this call.  Advised that we are awaiting RA to review pt's sleep study in order to provide results.  Pt's daughter verbalized understanding.  Daisy Larson

## 2012-11-13 NOTE — Telephone Encounter (Signed)
I called Daisy Larson's cell phone and left a message. I advised her that Mindy from Dr. Reginia Naas office called me. She said they are waiting for the sleep study results from the office at [facility].  Once he reviews them, they will call Daisy Larson to make an office visit appointment for her. Dr. Vassie Loll will advise [place], [facility] about clearance for the colonoscopy.

## 2012-11-14 NOTE — Telephone Encounter (Signed)
I placed sleep study results in your look at folder Dr. Vassie Loll. thanks

## 2012-11-14 NOTE — Telephone Encounter (Signed)
Dr Waymon Amato is a hospitalist & does not have an office- so we cannot get study from him. Pl have her inform [facility] to fax study to [place] - only then can I review study next week & let us know . I had already explained to her that I do not have access to [facility] records. Otherwise she has to ask her PCP to look at study & give clearance

## 2012-11-14 NOTE — Telephone Encounter (Signed)
Pt's daughter, Luiz Ochoa called to ask if someone could call pt and inform her of CT results; call [phone] or cell [phone]. Phoned pt and verified that I did call her about the results and she stated I did. There is an open encounter from 11/11/12 concerning the clearance  And Dr Vassie Loll is still waiting on the sleep study results.

## 2012-11-14 NOTE — Telephone Encounter (Signed)
Pt called back again- he put Selena Batten (from [facility]) on the phone. Selena Batten states that the sleep study results have been sent to the ordering physicain "Dr. Janine Limbo" and that we would have to get this this from him. She then put spouse [name] back on the phone, He asks that this be read "asap" as pt needs clearance "asap" for procedures and can't wait any longer. Hazel Sams

## 2012-11-15 ENCOUNTER — Ambulatory Visit: Payer: BC Managed Care – PPO | Admitting: Gastroenterology

## 2012-11-15 NOTE — Telephone Encounter (Signed)
The patient called and wanted to know if we were any closer to scheduling the colonoscopy for her.  I told her Mindy from Dr. Reginia Naas office called me on the 14th Tues to let me know they will have the pt come in for an office visit, review the sleep study resuls and then talk about clearance for [place] to do the colonoscopy. The patient and husband were on speaker phone and said Dr. Reginia Naas office should have the results by now.  They called her MD at [facility] and they said the sleep study is now in Epic.  I told the patient and her husband I will call them as soon as we get clearance.

## 2012-11-18 ENCOUNTER — Telehealth: Payer: Self-pay | Admitting: Pulmonary Disease

## 2012-11-18 ENCOUNTER — Telehealth: Payer: Self-pay | Admitting: *Deleted

## 2012-11-18 NOTE — Telephone Encounter (Signed)
Pl send Rx toDME for CPAP 10 cm, small swift Fx (for her ) mask Obtain download on current settings & 1 mnth after change to 10 cm

## 2012-11-18 NOTE — Telephone Encounter (Signed)
Close this encounter.  Have one open already.

## 2012-11-18 NOTE — Telephone Encounter (Signed)
Pam from GI called back regarding earlier message for surgical clearance.  She can be reached @ ext [phone]. Leanora Ivanoff

## 2012-11-18 NOTE — Telephone Encounter (Signed)
Crystal from Pulmonary called and asked what I needed.  I explained that we are waiting for clearance from Dr. Vassie Loll before the patient can have a colonoscopy.  She recently had a sleep study done at [facility] in [facility].  The results were faxed to Dr. Vassie Loll and Hali Marry got those results and put them in his box for him to review.  Crystal said she would get this request to Dr. Vassie Loll and then an appointment will need to be made for the pt to see Dr. Vassie Loll.  Crystal said they will get back to me.

## 2012-11-18 NOTE — Telephone Encounter (Signed)
I called [facility] at Dr. Reginia Naas office to speak to [facility].  I was told she is doing triage and the party that answered said she would get the message to Mindy to call me back

## 2012-11-18 NOTE — Telephone Encounter (Signed)
Daisy Larson from. Dr. Reginia Naas office called back and advised me of the following. [name]., MD 11/18/2012 3:49 PM Signed  Pl let pt know that PSG showed that she stopped breathing 28/h - this si moderate OSA corrected by CPAP 10 cm.  Pl send this note to her GI - can perform endoscopy -take precautions for OSA

## 2012-11-18 NOTE — Telephone Encounter (Signed)
Made in error. Daisy Larson  °

## 2012-11-18 NOTE — Telephone Encounter (Signed)
Spoke with pt.  Informed her of below per Dr. Vassie Loll regaring PSG results and recs regarding procedure.  She verbalized understanding of this.  States the machine she is currently using is on loan from the hospital.  Pt states she hasn't had a mask fitting either.  She would like to know if Dr. Vassie Loll will be the one to place orders for a machine and mask fitting.  Dr. Vassie Loll, pls advise.  Thank you.

## 2012-11-18 NOTE — Telephone Encounter (Signed)
Dr. Vassie Loll, sleep study results are in your look at.  Pls advise if you have this with you and when you would like pt to f/u for this.  GI is calling back -- pt is trying to schedule colonoscopy but they cannot schedule without clearance first.  Pls advise.  Thank you.

## 2012-11-18 NOTE — Telephone Encounter (Signed)
lmomtcb for pt to inform her of below per Dr. Laurena Slimmer with [facility] in [facility].  Informed her of below per Dr. Vassie Loll.  She verbalized understanding.  Nothing further needed as they can pull this from epic.

## 2012-11-18 NOTE — Telephone Encounter (Signed)
Pl let pt know that PSG showed that she stopped breathing 28/h - this si moderate OSA corrected by CPAP 10 cm. Pl send this note to her GI - can perform endoscopy -take precautions for OSA

## 2012-11-19 ENCOUNTER — Other Ambulatory Visit (HOSPITAL_BASED_OUTPATIENT_CLINIC_OR_DEPARTMENT_OTHER): Payer: BC Managed Care – PPO | Admitting: Lab

## 2012-11-19 ENCOUNTER — Telehealth: Payer: Self-pay | Admitting: *Deleted

## 2012-11-19 DIAGNOSIS — C50919 Malignant neoplasm of unspecified site of unspecified female breast: Secondary | ICD-10-CM

## 2012-11-19 DIAGNOSIS — C50419 Malignant neoplasm of upper-outer quadrant of unspecified female breast: Secondary | ICD-10-CM

## 2012-11-19 DIAGNOSIS — D509 Iron deficiency anemia, unspecified: Secondary | ICD-10-CM

## 2012-11-19 DIAGNOSIS — C50911 Malignant neoplasm of unspecified site of right female breast: Secondary | ICD-10-CM

## 2012-11-19 LAB — CBC & DIFF AND RETIC
BASO%: 0.5 % (ref 0.0–2.0)
EOS%: 2.5 % (ref 0.0–7.0)
HCT: 39.3 % (ref 34.8–46.6)
Immature Retic Fract: 6.9 % (ref 1.60–10.00)
LYMPH%: 20.4 % (ref 14.0–49.7)
MCH: 29.5 pg (ref 25.1–34.0)
MCHC: 32.3 g/dL (ref 31.5–36.0)
MONO#: 0.5 10*3/uL (ref 0.1–0.9)
NEUT%: 69.1 % (ref 38.4–76.8)
RBC: 4.3 10*6/uL (ref 3.70–5.45)
Retic %: 3.41 % — ABNORMAL HIGH (ref 0.70–2.10)
WBC: 6.4 10*3/uL (ref 3.9–10.3)
lymph#: 1.3 10*3/uL (ref 0.9–3.3)

## 2012-11-19 LAB — COMPREHENSIVE METABOLIC PANEL (CC13)
BUN: 12 mg/dL (ref 7.0–26.0)
CO2: 26 mEq/L (ref 22–29)
Calcium: 9.4 mg/dL (ref 8.4–10.4)
Chloride: 104 mEq/L (ref 98–107)
Creatinine: 0.7 mg/dL (ref 0.6–1.1)
Total Bilirubin: 1.41 mg/dL — ABNORMAL HIGH (ref 0.20–1.20)

## 2012-11-19 NOTE — Telephone Encounter (Signed)
Dr. Vassie Loll responded to my staff message on 11-18-2012.  " She has moderate OSA.  Please let anesthesia know- Take precautions as would be done for this condition."

## 2012-11-19 NOTE — Telephone Encounter (Signed)
Called, spoke with pt.  Informed her of below per RA.  She verbalized understanding of this and is aware order will be sent to DME who will get in contact with her to set this up.  Advised to pls call back if she doesn't hear anything in a few days.  She verbalized understanding and voiced no further questions or concerns at this time.

## 2012-11-21 ENCOUNTER — Telehealth: Payer: Self-pay | Admitting: *Deleted

## 2012-11-21 NOTE — Telephone Encounter (Signed)
Daisy Larson called and asked if we heard from Dr. Vassie Loll.  I advised we did and she is cleared to have the colonoscopy.  We just need to let the anesthetist know to take precautions for moderate OSA. I told her [name] is talking to one of our MD's about doing the procedure.  I told her I will get back to her as soon as I know about a date. At that time I will have her come in and explain the colonoscopy prep and she can sign the appropriate paperwork.

## 2012-11-26 ENCOUNTER — Telehealth: Payer: Self-pay | Admitting: *Deleted

## 2012-11-26 ENCOUNTER — Telehealth: Payer: Self-pay | Admitting: Oncology

## 2012-11-26 ENCOUNTER — Ambulatory Visit (HOSPITAL_BASED_OUTPATIENT_CLINIC_OR_DEPARTMENT_OTHER): Payer: BC Managed Care – PPO | Admitting: Oncology

## 2012-11-26 ENCOUNTER — Other Ambulatory Visit: Payer: Self-pay | Admitting: *Deleted

## 2012-11-26 VITALS — BP 164/76 | HR 82 | Temp 97.7°F | Resp 20 | Ht 68.0 in | Wt 261.5 lb

## 2012-11-26 DIAGNOSIS — M899 Disorder of bone, unspecified: Secondary | ICD-10-CM

## 2012-11-26 DIAGNOSIS — M949 Disorder of cartilage, unspecified: Secondary | ICD-10-CM

## 2012-11-26 DIAGNOSIS — C50911 Malignant neoplasm of unspecified site of right female breast: Secondary | ICD-10-CM

## 2012-11-26 DIAGNOSIS — C50419 Malignant neoplasm of upper-outer quadrant of unspecified female breast: Secondary | ICD-10-CM

## 2012-11-26 DIAGNOSIS — Z1211 Encounter for screening for malignant neoplasm of colon: Secondary | ICD-10-CM

## 2012-11-26 DIAGNOSIS — Z170 Estrogen receptor positive status [ER+]: Secondary | ICD-10-CM

## 2012-11-26 MED ORDER — ANASTROZOLE 1 MG PO TABS: 1.0000 mg | ORAL_TABLET | Freq: Every day | ORAL | Status: DC

## 2012-11-26 MED ORDER — MOVIPREP 100 G PO SOLR: 1.0000 | Freq: Once | ORAL | Status: AC

## 2012-11-26 NOTE — Telephone Encounter (Signed)
Called pt to inform her Dr. Jarold Motto said she could be scheduled with him in the  [facility], 4th floor.  The date is 12-02-2012 at 1:30 PM which the pt chose.  I sent the Moviprep prescription to [facility] [place], .

## 2012-11-26 NOTE — Telephone Encounter (Signed)
Message copied by Derry Skill on Tue Nov 26, 2012  1:49 PM ------      Message from: Mike Gip S      Created: Mon Nov 25, 2012 10:51 AM      Regarding: RE: ed/colon       Daisy Larson , this pt can be scheduled for EGD/COLON with DR. Jarold Motto in [facility]- not anticoagulated but is on insulin -thanks      ----- Message -----         From: Mardella Layman, MD         Sent: 11/22/2012   8:19 AM           To: Sammuel Cooper, PA      Subject: RE: ed/colon                                             ok      ----- Message -----         From: Sammuel Cooper, PA         Sent: 11/21/2012   4:16 PM           To: Mardella Layman, MD      Subject: RE: ed/colon                                             DRP-  Cathlyn Parsons reviewed and agreed she should be fine to do [facility]  So , Will get her scheduled with you if that is OK ?      ----- Message -----         From: Cathlyn Parsons, CRNA         Sent: 11/21/2012   2:55 PM           To: Sammuel Cooper, PA      Subject: RE: ed/colon                                             Amy,            I do not see any reason why she cannot be done at [facility]; especially since she has been cleared by cardiology.  Thanks for conferring with me.            Kind regards,            John      ----- Message -----         From: Sammuel Cooper, PA         Sent: 11/19/2012   3:57 PM           To: Cathlyn Parsons, CRNA      Subject: FW: ed/colon                                             John, please review this patient's chart, she has recent note from my office visit with her. At that time she was undergoing cardiac and pulmonary evaluation. She's been diagnosed  with moderate sleep apnea but has been cleared for sedation by Dr.Alva. He also had a recent echo with an EF of 55-60%. So, she has no definite criteria that prevent her from being done at the [facility], and therefore should be able to be scheduled with Dr. Jarold Motto. Please review the chart and let us know so I can schedule  accordingly. If she had to be done at the hospital Dr. Jarold Motto was asking Dr. Rhea Belton to get involved.            ----- Message -----         From: Mardella Layman, MD         Sent: 11/19/2012   2:05 PM           To: Sammuel Cooper, PA      Subject: RE: ed/colon                                             purtyle said he would cdo her      ----- Message -----         From: Sammuel Cooper, PA         Sent: 11/19/2012   1:38 PM           To: Mardella Layman, MD      Subject: ed/colon                                                 DRP- we were waiting for cardiac and pulmonary clearance on this lady- both have cleared her. She needs to be done at Hospital with propofol I  Think  Can we schedule for you  ?  She is your pt .

## 2012-11-26 NOTE — Progress Notes (Signed)
ID: Bryson Dames   DOB: 11-27-1950  MR#: [id number]  CSN#:[id number]  HISTORY OF PRESENT ILLNESS: The patient had annual mammography 12/29/2011 at [facility], showing  an 11 mm focal nodular density in the upper outer quadrant of the right breast which had increased in size as compared to the prior study a year prior. Ultrasound showed no sonographic abnormality. 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 The patient underwent bilateral breast MRI 01/09/2012 showing a solitary enhancing mass in the lateral aspect of the right breast measuring 1.3 cm. There was no enlarged axillary or internal mammary adenopathy and no other abnormality was noted.  The patient was seen at the multidisciplinary breast cancer clinic 01/10/2012 with further treatments as detailed below  INTERVAL HISTORY: Sugey returns today with her husband [name] for followup of her right breast cancer. The interval history is significant for her home are either problems, including rectal bleeding, with colonoscopy planned for next week, and a second episode of Bell's palsy. 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 He is now on intravenous antibiotics. Otherwise he is doing well.  REVIEW OF SYSTEMS:  [name] started using her new CPAP equipment, but did not tolerate it. [name] tells me she is hypoxic at rest. They're going to be returning to her pulmonologist to see if he can find a different delivery system and to confirm whether she needs home oxygen. She is not exercising at present and we talked about diabetes control issues. She's got a followup with her primary care physician regarding that. She continues to sleep poorly, describes herself is moderately fatigued, cannot  hear well, has sinus problems, gum disease, easy bruising, chronic back pain, forgetfulness, anxiety and depression, aside from the diabetes, thyroid, and obesity problems. A detailed review of systems today was otherwise stable.  PAST MEDICAL HISTORY: Past Medical History  Diagnosis Date  . Cirrhosis   . Multiple thyroid nodules   . History of stomach ulcers   . Hernia   . Colon polyp   . Neuropathy of foot   . Restless legs   . Complication of anesthesia     WITH SECON HERNIA REPAIR 2012 [facility]  DIFFICULTY O2 SAT DROPPING ADMIT TO HOSPITAL   . Bundle branch block   . Hypertension   . Angina     COSTOCONDRITIS    . Shortness of breath     WITH EXERTION   . Diabetes mellitus   . Hepatitis      Did Not have hepatitis but needed Hep A and Hep B injections2012 SPOTS ON LIVER  DR Marcelene Butte  [phone]  . GERD (gastroesophageal reflux disease)     ULCERS  . Hyperthyroidism     NODULES ON THYROID  (DR BALEN 37  12-609)  . Goiter   . Headache     MIGRAINES  . Neuromuscular disorder     PERIPH NEUROPATHY   . Anxiety   . Depression   . Breast cancer 01/19/12     right breast lumpectomy/ER/PR positibe,her-2 neg.  . Allergy     tape  . S/P radiation therapy 02/19/12 - 04/01/12    Right Breast: 5000 cgy/25 Fractions with Boost/ 1000 cGy/5 Fractions  . Iron deficiency anemia 10/16/2012  She underwent liver biopsy 03/28/2011, at [facility], showing evidence  of early cirrhosis. There was mildly active chronic hepatitis (grade 1). She also has a history of thyroid nodules which have been stable on serial ultrasounds according to the patient  PAST SURGICAL HISTORY: Past Surgical History  Procedure Date  . Appendectomy   . Tonsillectomy   . Cesarean section   . Tubal ligation   . Abdominal hysterectomy   . Back surgery   . Hernia repair     X 2  . Anal fissure repair   . Carpal tunnel release     LEFT   . Colonoscopy w/ polypectomy   . Breast surgery     BIOPSY   Right Breast -  Sentinel Lymph Nodes  . Liver biopsy 03/28/11    [facility] - Cirrhosis  . Spinal fusion     FAMILY HISTORY Family History  Problem Relation Age of Onset  . Lung cancer Maternal Grandfather   . Colon cancer Paternal Grandfather   . Diabetes Mother   . Heart disease Maternal Uncle    the patient's father died at the age of 40, following a fall. The patient's mother died at the age of 29. 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 The patient had no brothers, 3 sisters, one of whom is present at the initial visit. There is no history of breast or ovarian cancer in the family, but one niece was diagnosed with colon cancer at the age of 59. Her paternal grandfather died from colon cancer. Her maternal grandfather died from lung cancer.  GYNECOLOGIC HISTORY: She had menarche at age 62. She went through menopause approximately age 62. She took hormone replacement on total recently. She is GX P2, first pregnancy to term age 62.  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 Her husband of 40+ years, [name], is a Education officer, environmental. He underwent heart transplant at [facility] at May 2011 and is doing very well except for pain from costochondritis. Their children are Tacie Mccuistion who lives in [place] and is a Chief Financial Officer, and [name],  also living in [place], who works as a Scientist, forensic. The patient has one grandchild. She attends an independent [employer]    ADVANCED DIRECTIVES: in place  HEALTH MAINTENANCE: History  Substance Use Topics  . Smoking status: Never Smoker   . Smokeless tobacco: Never Used  . Alcohol Use: No     Colonoscopy: 2011  PAP: 2012  Bone density: 2003  Lipid panel:  Allergies  Allergen Reactions  . Tape   . Oxymorphone     lethargic    Current Outpatient Prescriptions  Medication Sig Dispense Refill  . anastrozole (ARIMIDEX) 1 MG  tablet Take 1 tablet (1 mg total) by mouth daily.  90 tablet  3  . aspirin 81 MG tablet Take 81 mg by mouth daily.      . B Complex-C (SUPER B COMPLEX PO) Take 1 tablet by mouth daily.      . B-D UF III MINI PEN NEEDLES 31G X 5 MM MISC       . buPROPion (WELLBUTRIN XL) 300 MG 24 hr tablet Take 1 tablet by mouth Daily.      . calcium-vitamin D (OSCAL WITH D) 500-200 MG-UNIT per tablet Take 1 tablet by mouth daily.      . cetirizine (ZYRTEC) 10 MG tablet Take 10 mg by mouth daily.      . Cholecalciferol 1000 UNITS capsule Take 2,000  Units by mouth daily.      Marland Kitchen CINNAMON PO Take 2 tablets by mouth daily.      . DULoxetine (CYMBALTA) 60 MG capsule Take 60 mg by mouth daily.      . ferrous sulfate 325 (65 FE) MG tablet Take 1 tablet (325 mg total) by mouth 3 (three) times daily with meals.  90 tablet  0  . gabapentin (NEURONTIN) 800 MG tablet 800 mg 3 (three) times daily.       Chilton Si Tea, Camillia sinensis, (GREEN TEA EXTRACT PO) Take 1 tablet by mouth daily. 400mg       . insulin lispro protamine-insulin lispro (HUMALOG 75/25) (75-25) 100 UNIT/ML SUSP Inject 60-70 Units into the skin 2 (two) times daily with a meal. 70 units in the morning and 60 units in the evening      . lisinopril (PRINIVIL,ZESTRIL) 20 MG tablet Take 20 mg by mouth every morning. Take in the morning      . Magnesium 400 MG CAPS Take 1 capsule by mouth daily.      . meclizine (ANTIVERT) 25 MG tablet Take 12.5 mg by mouth 3 (three) times daily as needed. vertigo      . metFORMIN (GLUCOPHAGE-XR) 500 MG 24 hr tablet Take 1,000 mg by mouth 2 (two) times daily.       Marland Kitchen MOVIPREP 100 G SOLR Take 1 kit (100 g total) by mouth once.  1 kit  0  . MULTIPLE VITAMIN PO Take 1 tablet by mouth daily.      . Omega-3 Fatty Acids (EQL OMEGA 3 FISH OIL) 1400 MG CAPS Take 1 capsule by mouth daily.      Marland Kitchen omeprazole (PRILOSEC) 40 MG capsule Take 40 mg by mouth daily.      Marland Kitchen OVER THE COUNTER MEDICATION Take 1 tablet by mouth daily. "Grape seed and  resveratrol"      . pramipexole (MIRAPEX) 1 MG tablet Take 1 mg by mouth at bedtime.      . pravastatin (PRAVACHOL) 40 MG tablet Take 40 mg by mouth daily.       . propranolol (INDERAL LA) 60 MG 24 hr capsule Take 60 mg by mouth daily. Take at night      . vitamin B-12 (CYANOCOBALAMIN) 1000 MCG tablet Take 1,000 mcg by mouth daily.        OBJECTIVE: Middle-aged white woman who appears fatigued Filed Vitals:   11/26/12 1451  BP: 164/76  Pulse: 82  Temp: 97.7 F (36.5 C)  Resp: 20     Body mass index is 39.76 kg/(m^2).    ECOG FS: 2 Filed Weights   11/26/12 1451  Weight: 261 lb 8 oz (118.616 kg)   Sclerae unicteric No peripheral adenopathy Lungs no rales or rhonchi Heart regular rate and rhythm Abd obese, soft and nontender with positive bowel sounds MSK no focal spinal tenderness, no peripheral edema Neuro: nonfocal, well oriented, unfocused affect Breasts: The right breast is status post lumpectomy and radiation. No evidence of local recurrence. The left breast is unremarkable. Axillae benign bilaterally with no adenopathy palpated.  LAB RESULTS: Lab Results  Component Value Date   WBC 6.4 11/19/2012   NEUTROABS 4.4 11/19/2012   HGB 12.7 11/19/2012   HCT 39.3 11/19/2012   MCV 91.4 11/19/2012   PLT 96* 11/19/2012      Chemistry      Component Value Date/Time   NA 138 11/19/2012 1200   NA 139 11/04/2012 1020   K 4.3  11/19/2012 1200   K 3.9 11/04/2012 1020   CL 104 11/19/2012 1200   CL 103 11/04/2012 1020   CO2 26 11/19/2012 1200   CO2 29 11/04/2012 1020   BUN 12.0 11/19/2012 1200   BUN 10 11/04/2012 1020   CREATININE 0.7 11/19/2012 1200   CREATININE 0.8 11/04/2012 1020      Component Value Date/Time   CALCIUM 9.4 11/19/2012 1200   CALCIUM 9.2 11/04/2012 1020   ALKPHOS 184* 11/19/2012 1200   ALKPHOS 136* 11/04/2012 1020   AST 91* 11/19/2012 1200   AST 57* 11/04/2012 1020   ALT 132* 11/19/2012 1200   ALT 50* 11/04/2012 1020   BILITOT 1.41* 11/19/2012 1200   BILITOT 1.1 11/04/2012 1020      Ferritin  12 10/08/2012  Lab Results  Component Value Date   LABCA2 25 01/10/2012     STUDIES: Ct Abdomen Pelvis W Contrast  11/11/2012  *RADIOLOGY REPORT*  Clinical Data: Rectal bleeding.  History of hepatomegaly. Diverticulitis history.  History of right breast cancer.  CT ABDOMEN AND PELVIS WITH CONTRAST  Technique:  Multidetector CT imaging of the abdomen and pelvis was performed following the standard protocol during bolus administration of intravenous contrast.  Contrast: OMNIPAQUE IOHEXOL 300 MG/ML  SOLN  Comparison: None.  Findings: The liver has a diffusely nodular contour, consistent with cirrhosis.  Cranial caudal length of the liver is 19.9 cm, enlarged.  Spleen measures 14.4 cm in cranial caudal length, borderline enlarged. The portal vein, superior mesenteric vein, and splenic vein are patent.  Small para esophageal varices are evident.  The stomach, duodenum, pancreas, and adrenal glands are normal. Several tiny calcified stones are seen in the gallbladder. Cortical scarring is noted in the upper poles of each kidney, right greater than left.  2.7 cm low density lesion in the lower pole of the right kidney is compatible with a cyst.  The left kidney and is unremarkable.  No abdominal aortic aneurysm.  1.7 x 1.5 cm lymph node is seen in the gastrohepatic ligament.  3.6 x 1.9 cm hepato duodenal ligament lymph node is associated.  No evidence for free fluid in the abdomen.  Imaging through the pelvis shows no free intraperitoneal fluid. There is no pelvic sidewall lymphadenopathy.  Bladder is normal. The uterus is surgically absent.  No evidence for adnexal mass. Diffuse diverticulosis noted in the left colon without diverticulitis.  The terminal ileum is normal. The appendix is not visualized, but there is no edema or inflammation in the region of the cecum.  Bone windows reveal no worrisome lytic or sclerotic osseous lesions.  The patient is status post PLIF at L4-5.  IMPRESSION:  Enlarged, nodular liver consistent with cirrhosis.  Splenic size is at upper normal. Mild para esophageal varices are evident.  Gastrohepatic and hepatoduodenal ligament mild lymphadenopathy. This may be related to the underlying chronic liver disease.   Original Report Authenticated By: Kennith Center, M.D.     ASSESSMENT: 62 year [place]   (1) status post right lumpectomy 01/19/2012 for a T1c N0, stage IA invasive mucinous carcinoma, grade 1, estrogen 99% and progesterone 100% receptor positive, with an MIB of 8, and no HER-2 amplification.  (2) completed radiation 04/01/2012  (3) on anastrozole since June 2013  (4)  iron deficiency anemia, s/p ferumoxytol x2 December 2013  (5) osteopenia, L fem neck T -1.9 on June 2013  PLAN:  [name] is a bit overwhelmed by all her multiple medical issues. She does need to get her  GI bleeding diagnosed in taking care of. The anemia has resolved with iron supplementation. She is working on her sleep apnea, but her diabetes is still poorly controlled. We talked about diet and exercise, but she really needs to get back to her pulmonologists to see what kind of CPAP machine she may be able to tolerate best.  We discussed osteopenia in detail, and I am concerned by adding anastrozole her bone density may worsen significantly. We went in to bisphosphonates and particularly the risks of osteonecrosis of the jaw, as well as minor side effects from the oral or infusional forms. She recently visited her dentist, tells me her teeth are in great shape, and she has had no extractions are implants in the recent past. We decided to do zoledronic acid on a yearly basis starting February 20. She was instructed to take some TUMS on the day of treatment to prevent the possible hypercalcemia symptoms.  Otherwise she will see [place] again in 3 months. She knows to call for any problems that may develop before that visit.     Aleicia Kenagy C    11/26/2012

## 2012-11-26 NOTE — Telephone Encounter (Signed)
gv pt appt schedule for February and April.

## 2012-11-27 ENCOUNTER — Telehealth: Payer: Self-pay | Admitting: Pulmonary Disease

## 2012-11-27 NOTE — Telephone Encounter (Signed)
ATC NA line busy x 3 wcb

## 2012-11-28 NOTE — Telephone Encounter (Signed)
Pt has been scheduled to see RA for her low sats on Fri., 11/29/12 @ 3pm. Spouse verbalized understanding.

## 2012-11-29 ENCOUNTER — Ambulatory Visit (INDEPENDENT_AMBULATORY_CARE_PROVIDER_SITE_OTHER): Payer: BC Managed Care – PPO | Admitting: Pulmonary Disease

## 2012-11-29 ENCOUNTER — Encounter: Payer: Self-pay | Admitting: Pulmonary Disease

## 2012-11-29 VITALS — BP 124/72 | HR 73 | Temp 97.3°F | Ht 68.0 in | Wt 261.8 lb

## 2012-11-29 DIAGNOSIS — G4733 Obstructive sleep apnea (adult) (pediatric): Secondary | ICD-10-CM

## 2012-11-29 MED ORDER — PREDNISONE 10 MG PO TABS: ORAL_TABLET | ORAL | Status: DC

## 2012-11-29 NOTE — Patient Instructions (Addendum)
Your CPAP is set at 10 cm Send in card in 4 weeks for download Take CPAP with you when you go in for your endoscopy - cleared with due risk

## 2012-11-29 NOTE — Assessment & Plan Note (Signed)
PSG showed  28/h - moderate OSA corrected by CPAP 10 cm.  Weight loss encouraged, compliance with goal of at least 4-6 hrs every night is the expectation. Advised against medications with sedative side effects Cautioned against driving when sleepy - understanding that sleepiness will vary on a day to day basis  She does not seem to require daytime oxygen. Cleared for endoscopy under propofol with due risk using precautions for OSA. She can be placed on CPAP during recovery from anesthesia.

## 2012-11-29 NOTE — Progress Notes (Signed)
Subjective:    Patient ID: Daisy Larson, female    DOB: Jul 28, 1951, 62 y.o.   MRN: [id number]  HPI 62 y.o. female never smoker presents for evaluation of obstructive sleep apnea after recent hospitalization for acute hypercarbic respiratory failure.  She reports increasing somnolence, decreased memory and periods of staring spells over the last two years. Over time family has noted while she is on the computer she will often daze off or breifly fall asleep. Initially she was easily awakened by calling her name. Recently she has been harder to arouse when she falls asleep. She has never been in a car accident.  She tripped, fell and developed left shoulder pain. She took her husband's oxycodone 10 mg tablet and was unresponsive by morning requiring hospital admission. ABG was 7.36/52/78 and ammonia level was 61. She has a history of nonalcoholic cirrhosis. Treated checking movements in the ED and acute encephalopathy, Workup including MRI of brain, CT head, EEG, blood studies unremarkable for cause. Overnight oximetry showed drop oxygen saturations to high 70s on 12/11 early am. Per spouse, patient has history of snoring and breath holding spells at night. Outpatient sleep study was arranged with [facility] . She was discharged on autoCPAP at bedtime- Auto titrate, 5-20 mmH2O, mask of choice and humidifier admission chest x-ray showed mild edema . Echo showed normal LV function, normal RV function with dilated right atrium  Anemia panel suggested iron deficiency. She had issues with low B12 but currently is receiving injections and oral repletion as an outpatient. B12 levels were high. She is on Mirapex, Neurontin and magnesium for restless legs and diabetic neuropathy.  TSH was low normal with a low free T4 and a normal free T3  Bedtime is not fixed but around 11 PM, sleep latency is a few minutes, she sleeps on her left side with one pillow. She is 2 awakenings including bathroom visits without any  post void sleep latency. She is out of bed at 8 AM feeling tired with dryness of mouth.She's gained about 10 pounds in the last 2 years. Epworth sleepiness score is 19 /24.   She reports new onset pedal edema denies dyspnea, orthopnea paroxysmal nocturnal dyspnea. She has an appointment with her cardiologist soon.   11/29/2012 PSG showed that she stopped breathing 28/h - this si moderate OSA corrected by CPAP 10 cm.  GI - can perform endoscopy -take precautions for OSA wearing CPAP every night avg 6-8 hrs per night---  Changed mask & is able to tolerate CPAP much better More energy, feels better rested, husband has not noted apneas or snoring There is no history suggestive of cataplexy, sleep paralysis or parasomnias   Pt states o2 sats in Dr office this week were low 80s w SOB--MD felt like she should be examined for o2 needs-- she does not desaturate in the office today   Review of Systems neg for any significant sore throat, dysphagia, itching, sneezing, nasal congestion or excess/ purulent secretions, fever, chills, sweats, unintended wt loss, pleuritic or exertional cp, hempoptysis, orthopnea pnd or change in chronic leg swelling. Also denies presyncope, palpitations, heartburn, abdominal pain, nausea, vomiting, diarrhea or change in bowel or urinary habits, dysuria,hematuria, rash, arthralgias, visual complaints, headache, numbness weakness or ataxia.     Objective:   Physical Exam  Gen. Pleasant, obese, in no distress ENT - no lesions, no post nasal drip Neck: No JVD, no thyromegaly, no carotid bruits Lungs: no use of accessory muscles, no dullness to percussion, decreased without rales  or rhonchi  Cardiovascular: Rhythm regular, heart sounds  normal, no murmurs or gallops, no peripheral edema Musculoskeletal: No deformities, no cyanosis or clubbing , no tremors       Assessment & Plan:

## 2012-12-02 ENCOUNTER — Ambulatory Visit (AMBULATORY_SURGERY_CENTER): Payer: BC Managed Care – PPO | Admitting: Gastroenterology

## 2012-12-02 ENCOUNTER — Encounter: Payer: Self-pay | Admitting: Gastroenterology

## 2012-12-02 ENCOUNTER — Other Ambulatory Visit: Payer: Self-pay | Admitting: *Deleted

## 2012-12-02 VITALS — BP 130/63 | HR 76 | Temp 99.1°F | Resp 16 | Ht 67.0 in | Wt 257.0 lb

## 2012-12-02 DIAGNOSIS — K635 Polyp of colon: Secondary | ICD-10-CM

## 2012-12-02 DIAGNOSIS — D509 Iron deficiency anemia, unspecified: Secondary | ICD-10-CM

## 2012-12-02 DIAGNOSIS — D649 Anemia, unspecified: Secondary | ICD-10-CM

## 2012-12-02 DIAGNOSIS — Z860100 Personal history of colon polyps, unspecified: Secondary | ICD-10-CM

## 2012-12-02 DIAGNOSIS — E669 Obesity, unspecified: Secondary | ICD-10-CM

## 2012-12-02 DIAGNOSIS — K2970 Gastritis, unspecified, without bleeding: Secondary | ICD-10-CM

## 2012-12-02 DIAGNOSIS — K5730 Diverticulosis of large intestine without perforation or abscess without bleeding: Secondary | ICD-10-CM

## 2012-12-02 DIAGNOSIS — K7460 Unspecified cirrhosis of liver: Secondary | ICD-10-CM

## 2012-12-02 DIAGNOSIS — K649 Unspecified hemorrhoids: Secondary | ICD-10-CM

## 2012-12-02 DIAGNOSIS — Z1211 Encounter for screening for malignant neoplasm of colon: Secondary | ICD-10-CM

## 2012-12-02 DIAGNOSIS — D126 Benign neoplasm of colon, unspecified: Secondary | ICD-10-CM

## 2012-12-02 DIAGNOSIS — K2990 Gastroduodenitis, unspecified, without bleeding: Secondary | ICD-10-CM

## 2012-12-02 HISTORY — PX: ESOPHAGOGASTRODUODENOSCOPY: SHX1529

## 2012-12-02 HISTORY — DX: Polyp of colon: K63.5

## 2012-12-02 HISTORY — PX: COLONOSCOPY W/ POLYPECTOMY: SHX1380

## 2012-12-02 LAB — GLUCOSE, CAPILLARY
Glucose-Capillary: 213 mg/dL — ABNORMAL HIGH (ref 70–99)
Glucose-Capillary: 182 mg/dL — ABNORMAL HIGH (ref 70–99)

## 2012-12-02 MED ORDER — SODIUM CHLORIDE 0.9 % IV SOLN: 500.0000 mL | INTRAVENOUS | Status: DC

## 2012-12-02 MED ORDER — HYDROCORTISONE ACE-PRAMOXINE 2.5-1 % RE CREA: TOPICAL_CREAM | RECTAL | Status: DC | PRN

## 2012-12-02 NOTE — Op Note (Signed)
 [facility] [address] [place], [address]   ENDOSCOPY PROCEDURE REPORT  PATIENT: Daisy, Larson  MR#: [id number] BIRTHDATE: 05-24-1951 , 61  yrs. old GENDER: Female ENDOSCOPIST:David Hale Bogus, MD, [facility] REFERRED BY: PROCEDURE DATE:  12/02/2012 PROCEDURE:   EGD w/ biopsy for H.pylori ASA CLASS:    Class III INDICATIONS: Screening for varices.  this patient has cirrhosis documentable CT scan with pancytopenia.  She is recently been treated for breast cancer.she has severe sleep apnea and is oxygen dependent. MEDICATION: There was residual sedation effect present from prior procedure. TOPICAL ANESTHETIC:  DESCRIPTION OF PROCEDURE:   After the risks and benefits of the procedure were explained, informed consent was obtained.  [facility] GIF-H180 [id number]  endoscope was introduced through the mouth  and advanced to the second portion of the duodenum .  The instrument was slowly withdrawn as the mucosa was fully examined.      ESOPHAGUS: The mucosa of the esophagus appeared normal.  Stomach was remarkable for nodular gastritis in the fundus and antrum of the stomach.  There is erythematous red mucosa but no evidence of gastric varices.  There were inflammatory polyps noted in the fundus of the stomach, but it was not felt prudent to do biopsies.  There is no evidence of active bleeding in upper GI tract are all seen.   Stomach was remarkable for nodular gastritis in the fundus and antrum of the stomach. CLO biopsy was done. There is erythematous red mucosa but no evidence of gastric varices. There were inflammatory polyps noted in the fundus of the stomach, but it was not felt prudent to do biopsies.  There is no evidence of active bleeding in upper GI tract are all seen.  DUODENUM: The duodenal mucosa showed no abnormalities in the bulb and second portion of the duodenum.There were no esophageal varices, soft of stricture or bleeding lesions  noted. Retroflexed views revealed no abnormalities.    The scope was then withdrawn from the patient and the procedure completed.  COMPLICATIONS: There were no complications.   ENDOSCOPIC IMPRESSION: 1.   The mucosa of the esophagus appeared normal ...no varices noted. 2.   Stomach was remarkable for nodular gastritis in the fundus and antrum of the stomach.  There is erythematous red mucosa but no evidence of gastric varices.  There were inflammatory polyps noted in the fundus of the stomach,and in the antrum.No ulcerations or bleeding lesions were noted.  No  retained food products in the stomach.  This gastritis is probably either from medications or from H. pylori infection, but does not appear to be consistent with portal gastropathy or esophageal varices. is no evidence of upper GI bleeding at this time.  Depending onl this patient's clinical course she should probably be screened  for gastric varices every 2 years.  RECOMMENDATIONS: 1.  Await biopsy results 2.  Rx CLO if positive 3.  Continue current medications    _______________________________ eSigned:  Mardella Layman, MD, [facility] 12/02/2012 2:21 PM  cc dr. Deeann Dowse   PATIENT NAME:  Daisy, Larson MR#: [id number]

## 2012-12-02 NOTE — Progress Notes (Signed)
Called to room to assist during endoscopic procedure.  Patient ID and intended procedure confirmed with present staff. Received instructions for my participation in the procedure from the performing physician. ewm 

## 2012-12-02 NOTE — Op Note (Signed)
[facility] [address] [place], [address]   COLONOSCOPY PROCEDURE REPORT  PATIENT: Daisy Larson, Daisy Larson  MR#: [id number] BIRTHDATE: December 08, 1950 , 61  yrs. old GENDER: Female ENDOSCOPIST: Mardella Layman, MD, [facility] REFERRED BY: PROCEDURE DATE:  12/02/2012 PROCEDURE:   Colonoscopy with snare polypectomy ASA CLASS:   Class III INDICATIONS:Patient's personal history of colon polyps and hematochezia. MEDICATIONS: propofol (Diprivan) 300mg  IV  DESCRIPTION OF PROCEDURE:   After the risks and benefits and of the procedure were explained, informed consent was obtained.  A digital rectal exam revealed external hemorrhoids.    The [facility] CF-H180AL [id number]  endoscope was introduced through the anus and advanced to the cecum, which was identified by both the appendix and ileocecal valve .  The quality of the prep was good, using MoviPrep .  The instrument was then slowly withdrawn as the colon was fully examined.     COLON FINDINGS: Multiple smooth sessile polyps ranging between 3-43mm in size were found at the hepatic flexure and splenic flexure.  A polypectomy was performed with a cold snare.  The resection was complete and the polyp tissue was completely retrieved.   There was severe diverticulosis noted in the descending colon and sigmoid colon with associated muscular hypertrophy and luminal narrowing. Retroflexed views revealed external hemorrhoids.     The scope was then withdrawn from the patient and the procedure completed.  COMPLICATIONS: There were no complications. ENDOSCOPIC IMPRESSION: 1.   Multiple sessile polyps ranging between 3-8mm in size were found at the hepatic flexure and splenic flexure; polypectomy was performed with a cold snare ..probable small adenomas 2.   There was severe diverticulosis noted in the descending colon and sigmoid colon ...no colitis or bleeding 3. Large external hemorrhoids...no rectal varices  noted...  RECOMMENDATIONS: 1.  Await pathology results 2.  High fiber diet 3.  Repeat Colonoscopy in 5 years...Marland Kitchendepends on clinical status however ! !!!! 4. local hemorrhoid care  REPEAT EXAM:  XB:JYNWGN Wallace Keller, MD, Ruthann Cancer, MD, and [place], Amy PA-C  _______________________________ eSigned:  Mardella Layman, MD, [facility] 12/02/2012 2:10 PM     PATIENT NAME:  Daisy Larson, Daisy Larson MR#: [id number]

## 2012-12-02 NOTE — Progress Notes (Addendum)
Patient did not have preoperative order for IV antibiotic SSI prophylaxis. (G8918)  Patient did not experience any of the following events: a burn prior to discharge; a fall within the facility; wrong site/side/patient/procedure/implant event; or a hospital transfer or hospital admission upon discharge from the facility. (G8907)  

## 2012-12-02 NOTE — Patient Instructions (Addendum)

## 2012-12-02 NOTE — Progress Notes (Signed)
Made CRNA aware that pt. Had one ice chip at 11a.m.

## 2012-12-03 ENCOUNTER — Telehealth: Payer: Self-pay | Admitting: *Deleted

## 2012-12-03 NOTE — Telephone Encounter (Signed)
  Follow up Call-  Call back number 12/02/2012  Post procedure Call Back phone  # [phone] OK TO TALK TO HUSBAND IF ANSWER PHONE.  Permission to leave phone message Yes     Patient questions:  Do you have a fever, pain , or abdominal swelling? no Pain Score  0 *  Have you tolerated food without any problems? yes  Have you been able to return to your normal activities? yes  Do you have any questions about your discharge instructions: Diet   no Medications  no Follow up visit  no  Do you have questions or concerns about your Care? no  Actions: * If pain score is 4 or above: No action needed, pain <4.

## 2012-12-04 ENCOUNTER — Encounter: Payer: Self-pay | Admitting: Gastroenterology

## 2012-12-17 ENCOUNTER — Other Ambulatory Visit: Payer: Self-pay | Admitting: Endocrinology

## 2012-12-17 DIAGNOSIS — E049 Nontoxic goiter, unspecified: Secondary | ICD-10-CM

## 2012-12-19 ENCOUNTER — Ambulatory Visit (HOSPITAL_BASED_OUTPATIENT_CLINIC_OR_DEPARTMENT_OTHER): Payer: BC Managed Care – PPO

## 2012-12-19 VITALS — BP 130/77 | HR 74 | Temp 97.3°F | Resp 20

## 2012-12-19 DIAGNOSIS — K7460 Unspecified cirrhosis of liver: Secondary | ICD-10-CM

## 2012-12-19 DIAGNOSIS — F5083 Pica in adults: Secondary | ICD-10-CM

## 2012-12-19 DIAGNOSIS — Z860100 Personal history of colon polyps, unspecified: Secondary | ICD-10-CM

## 2012-12-19 DIAGNOSIS — G4733 Obstructive sleep apnea (adult) (pediatric): Secondary | ICD-10-CM

## 2012-12-19 DIAGNOSIS — D649 Anemia, unspecified: Secondary | ICD-10-CM

## 2012-12-19 DIAGNOSIS — IMO0001 Reserved for inherently not codable concepts without codable children: Secondary | ICD-10-CM

## 2012-12-19 DIAGNOSIS — C50419 Malignant neoplasm of upper-outer quadrant of unspecified female breast: Secondary | ICD-10-CM

## 2012-12-19 DIAGNOSIS — I10 Essential (primary) hypertension: Secondary | ICD-10-CM

## 2012-12-19 DIAGNOSIS — E1149 Type 2 diabetes mellitus with other diabetic neurological complication: Secondary | ICD-10-CM

## 2012-12-19 DIAGNOSIS — G2581 Restless legs syndrome: Secondary | ICD-10-CM

## 2012-12-19 DIAGNOSIS — E1142 Type 2 diabetes mellitus with diabetic polyneuropathy: Secondary | ICD-10-CM

## 2012-12-19 DIAGNOSIS — M899 Disorder of bone, unspecified: Secondary | ICD-10-CM

## 2012-12-19 DIAGNOSIS — F5089 Other specified eating disorder: Secondary | ICD-10-CM

## 2012-12-19 MED ORDER — ZOLEDRONIC ACID 4 MG/100ML IV SOLN
4.0000 mg | Freq: Once | INTRAVENOUS | Status: AC
  Administered 2012-12-19: 4 mg via INTRAVENOUS
  Filled 2012-12-19: qty 100

## 2012-12-19 MED ORDER — SODIUM CHLORIDE 0.9 % IV SOLN
Freq: Once | INTRAVENOUS | Status: AC
  Administered 2012-12-19: 14:00:00 via INTRAVENOUS

## 2012-12-19 NOTE — Patient Instructions (Addendum)
Zoledronic Acid injection (Hypercalcemia, Oncology) What is this medicine? ZOLEDRONIC ACID (ZOE le dron ik AS id) lowers the amount of calcium loss from bone. It is used to treat too much calcium in your blood from cancer. It is also used to prevent complications of cancer that has spread to the bone. This medicine may be used for other purposes; ask your health care provider or pharmacist if you have questions. What should I tell my health care provider before I take this medicine? They need to know if you have any of these conditions: -aspirin-sensitive asthma -dental disease -kidney disease -an unusual or allergic reaction to zoledronic acid, other medicines, foods, dyes, or preservatives -pregnant or trying to get pregnant -breast-feeding How should I use this medicine? This medicine is for infusion into a vein. It is given by a health care professional in a hospital or clinic setting. Talk to your pediatrician regarding the use of this medicine in children. Special care may be needed. Overdosage: If you think you have taken too much of this medicine contact a poison control center or emergency room at once. NOTE: This medicine is only for you. Do not share this medicine with others. What if I miss a dose? It is important not to miss your dose. Call your doctor or health care professional if you are unable to keep an appointment. What may interact with this medicine? -certain antibiotics given by injection -NSAIDs, medicines for pain and inflammation, like ibuprofen or naproxen -some diuretics like bumetanide, furosemide -teriparatide -thalidomide This list may not describe all possible interactions. Give your health care provider a list of all the medicines, herbs, non-prescription drugs, or dietary supplements you use. Also tell them if you smoke, drink alcohol, or use illegal drugs. Some items may interact with your medicine. What should I watch for while using this medicine? Visit  your doctor or health care professional for regular checkups. It may be some time before you see the benefit from this medicine. Do not stop taking your medicine unless your doctor tells you to. Your doctor may order blood tests or other tests to see how you are doing. Women should inform their doctor if they wish to become pregnant or think they might be pregnant. There is a potential for serious side effects to an unborn child. Talk to your health care professional or pharmacist for more information. You should make sure that you get enough calcium and vitamin D while you are taking this medicine. Discuss the foods you eat and the vitamins you take with your health care professional. Some people who take this medicine have severe bone, joint, and/or muscle pain. This medicine may also increase your risk for a broken thigh bone. Tell your doctor right away if you have pain in your upper leg or groin. Tell your doctor if you have any pain that does not go away or that gets worse. What side effects may I notice from receiving this medicine? Side effects that you should report to your doctor or health care professional as soon as possible: -allergic reactions like skin rash, itching or hives, swelling of the face, lips, or tongue -anxiety, confusion, or depression -breathing problems -changes in vision -feeling faint or lightheaded, falls -jaw burning, cramping, pain -muscle cramps, stiffness, or weakness -trouble passing urine or change in the amount of urine Side effects that usually do not require medical attention (report to your doctor or health care professional if they continue or are bothersome): -bone, joint, or muscle pain -  fever -hair loss -irritation at site where injected -loss of appetite -nausea, vomiting -stomach upset -tired This list may not describe all possible side effects. Call your doctor for medical advice about side effects. You may report side effects to FDA at  1-800-FDA-1088. Where should I keep my medicine? This drug is given in a hospital or clinic and will not be stored at home. NOTE: This sheet is a summary. It may not cover all possible information. If you have questions about this medicine, talk to your doctor, pharmacist, or health care provider.  2013, [employer]. (04/14/2011 9:06:58 AM)  

## 2012-12-20 ENCOUNTER — Ambulatory Visit
Admission: RE | Admit: 2012-12-20 | Discharge: 2012-12-20 | Disposition: A | Discharge status: Home / Self Care | Payer: BC Managed Care – PPO | Source: Ambulatory Visit | Attending: Endocrinology | Admitting: Endocrinology

## 2012-12-20 DIAGNOSIS — E049 Nontoxic goiter, unspecified: Secondary | ICD-10-CM

## 2013-01-06 ENCOUNTER — Telehealth: Payer: Self-pay | Admitting: Pulmonary Disease

## 2013-01-06 NOTE — Telephone Encounter (Signed)
Download 3/14 - CPAP 10 cm very effective, cuts down events,no residuals, good usage Mild leak-  Needs better seal , if she can achieve

## 2013-01-09 NOTE — Telephone Encounter (Signed)
I spoke with patient about results and she verbalized understanding and had no questions 

## 2013-02-24 ENCOUNTER — Telehealth: Payer: Self-pay | Admitting: Pulmonary Disease

## 2013-02-24 NOTE — Telephone Encounter (Signed)
Called and spoke to pt on 02/24/13 to make next ov per recall.  Pt stated she has switched to a diff dr. Leanora Ivanoff

## 2013-02-25 ENCOUNTER — Encounter: Payer: Self-pay | Admitting: Physician Assistant

## 2013-02-25 ENCOUNTER — Telehealth: Payer: Self-pay | Admitting: *Deleted

## 2013-02-25 ENCOUNTER — Ambulatory Visit (HOSPITAL_BASED_OUTPATIENT_CLINIC_OR_DEPARTMENT_OTHER): Payer: BC Managed Care – PPO | Admitting: Physician Assistant

## 2013-02-25 ENCOUNTER — Other Ambulatory Visit (HOSPITAL_BASED_OUTPATIENT_CLINIC_OR_DEPARTMENT_OTHER): Payer: BC Managed Care – PPO | Admitting: Lab

## 2013-02-25 VITALS — BP 126/69 | HR 78 | Temp 98.1°F | Resp 20 | Ht 67.0 in | Wt 254.2 lb

## 2013-02-25 DIAGNOSIS — M899 Disorder of bone, unspecified: Secondary | ICD-10-CM

## 2013-02-25 DIAGNOSIS — E876 Hypokalemia: Secondary | ICD-10-CM

## 2013-02-25 DIAGNOSIS — C50911 Malignant neoplasm of unspecified site of right female breast: Secondary | ICD-10-CM

## 2013-02-25 DIAGNOSIS — Z853 Personal history of malignant neoplasm of breast: Secondary | ICD-10-CM

## 2013-02-25 DIAGNOSIS — C50419 Malignant neoplasm of upper-outer quadrant of unspecified female breast: Secondary | ICD-10-CM

## 2013-02-25 DIAGNOSIS — K7460 Unspecified cirrhosis of liver: Secondary | ICD-10-CM

## 2013-02-25 DIAGNOSIS — C50919 Malignant neoplasm of unspecified site of unspecified female breast: Secondary | ICD-10-CM

## 2013-02-25 DIAGNOSIS — C50411 Malignant neoplasm of upper-outer quadrant of right female breast: Secondary | ICD-10-CM

## 2013-02-25 DIAGNOSIS — E119 Type 2 diabetes mellitus without complications: Secondary | ICD-10-CM

## 2013-02-25 LAB — CBC WITH DIFFERENTIAL/PLATELET
Basophils Absolute: 0 10*3/uL (ref 0.0–0.1)
EOS%: 0 % (ref 0.0–7.0)
HGB: 10.8 g/dL — ABNORMAL LOW (ref 11.6–15.9)
LYMPH%: 9.3 % — ABNORMAL LOW (ref 14.0–49.7)
MCH: 31.2 pg (ref 25.1–34.0)
MCV: 92.2 fL (ref 79.5–101.0)
MONO%: 9 % (ref 0.0–14.0)
NEUT%: 81.6 % — ABNORMAL HIGH (ref 38.4–76.8)
Platelets: 246 10*3/uL (ref 145–400)
RDW: 14.6 % — ABNORMAL HIGH (ref 11.2–14.5)

## 2013-02-25 LAB — COMPREHENSIVE METABOLIC PANEL (CC13)
BUN: 25.9 mg/dL (ref 7.0–26.0)
CO2: 42 mEq/L — ABNORMAL HIGH (ref 22–29)
Calcium: 9.8 mg/dL (ref 8.4–10.4)
Creatinine: 1.2 mg/dL — ABNORMAL HIGH (ref 0.6–1.1)
Glucose: 552 mg/dl (ref 70–99)
Total Bilirubin: 0.81 mg/dL (ref 0.20–1.20)

## 2013-02-25 NOTE — Telephone Encounter (Signed)
Lm informing pt that she already have an appt scheduled for 02/26/13 at [facility] @2 :30pm. i also made her aware that she is scheduled to come in on 05/05/13. i informed her that i would place a letter/cal in the mail as well...td

## 2013-02-25 NOTE — Progress Notes (Signed)
ID: Daisy Larson   DOB: 28-Jun-1951  MR#: [id number]  CSN#:[id number]  HISTORY OF PRESENT ILLNESS: The patient had annual mammography 12/29/2011 at [facility], showing  an 11 mm focal nodular density in the upper outer quadrant of the right breast which had increased in size as compared to the prior study a year prior. Ultrasound showed no sonographic abnormality. 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 The patient underwent bilateral breast MRI 01/09/2012 showing a solitary enhancing mass in the lateral aspect of the right breast measuring 1.3 cm. There was no enlarged axillary or internal mammary adenopathy and no other abnormality was noted.  The patient was seen at the multidisciplinary breast cancer clinic 01/10/2012 with further treatments as detailed below  INTERVAL HISTORY:  Daisy Larson returns today with her husband Daisy Larson for followup of her right breast cancer. She continues on anastrozole which she is tolerating well. She also has a history of osteopenia, and was started on zoledronic acid in February 2014. She tolerated her first dose well, and has had no jaw pain or dental procedures.  Interval history is remarkable, however, for multiple hospitalizations in [facility] over the last several weeks. Unfortunately , we do not have any of these records, but Daisy Larson tells me she is status post hernia surgery proximally one month ago. She had extensive adhesions. Her liver started bleeding during surgery, and she had a hard time waking up from anesthesia. (This is per patient's oral history.) She tells me she went home for a week, then return to the emergency room for swelling after gaining 27 pounds of fluid. She was again admitted for several days in the hospital. She continues to  have some swelling, and feels weak and tired. She is on portable oxygen today. She is also using a CPAP at night for her sleep apnea which is quite severe.   REVIEW OF SYSTEMS:  Daisy Larson denies any fevers, chills, or night sweats. Her appetite is fair. She's had no significant nausea and denies any change in bowel or bladder habits. She status post colonoscopy and endoscopy in February, both of which were negative. She's had less rectal bleeding than before. She has an occasional cough, and continues to have shortness of breath which is chronic. She's had no abnormal headaches or change in vision. She does have chronic back pain, but denies any new or unusual myalgias, arthralgias, or bony pain.  A detailed review of systems is otherwise stable and noncontributory.   PAST MEDICAL HISTORY: Past Medical History  Diagnosis Date  . Cirrhosis   . Multiple thyroid nodules   . History of stomach ulcers   . Hernia   . Colon polyp   . Neuropathy of foot   . Restless legs   . Complication of anesthesia     WITH SECON HERNIA REPAIR 2012 [facility]  DIFFICULTY O2 SAT DROPPING ADMIT TO HOSPITAL   . Bundle branch block   . Hypertension   . Angina     COSTOCONDRITIS    . Shortness of breath     WITH EXERTION   . Diabetes mellitus   . Hepatitis      Did Not have hepatitis but needed Hep A and Hep B injections2012 SPOTS ON LIVER  DR Marcelene [place]te  [phone]  . GERD (gastroesophageal reflux disease)  ULCERS  . Hyperthyroidism     NODULES ON THYROID  (DR BALEN 37  12-609)  . Goiter   . Headache     MIGRAINES  . Neuromuscular disorder     PERIPH NEUROPATHY   . Anxiety   . Depression   . Breast cancer 01/19/12     right breast lumpectomy/ER/PR positibe,her-2 neg.  . Allergy     tape  . S/P radiation therapy 02/19/12 - 04/01/12    Right Breast: 5000 cgy/25 Fractions with Boost/ 1000 cGy/5 Fractions  . Iron deficiency anemia 10/16/2012  . Heart murmur   . Ulcer   She underwent liver biopsy  03/28/2011, at [facility], showing evidence of early cirrhosis. There was mildly active chronic hepatitis (grade 1). She also has a history of thyroid nodules which have been stable on serial ultrasounds according to the patient  PAST SURGICAL HISTORY: Past Surgical History  Procedure Laterality Date  . Appendectomy    . Tonsillectomy    . Cesarean section    . Tubal ligation    . Abdominal hysterectomy    . Back surgery    . Hernia repair      X 2  . Anal fissure repair    . Carpal tunnel release      LEFT   . Colonoscopy w/ polypectomy    . Breast surgery      BIOPSY  Right Breast -  Sentinel Lymph Nodes  . Liver biopsy  03/28/11    [facility] - Cirrhosis  . Spinal fusion    . Colonoscopy      FAMILY HISTORY Family History  Problem Relation Age of Onset  . Lung cancer Maternal Grandfather   . Colon cancer Paternal Grandfather   . Diabetes Mother   . Heart disease Maternal Uncle   . Diabetes Sister    the patient's father died at the age of 87, following a fall. The patient's mother died at the age of 24. 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 The patient had no brothers, 3 sisters, one of whom is present at the initial visit. There is no history of breast or ovarian cancer in the family, but one niece was diagnosed with colon cancer at the age of 64. Her paternal grandfather died from colon cancer. Her maternal grandfather died from lung cancer.  GYNECOLOGIC HISTORY: She had menarche at age 24. She went through menopause approximately age 4. She took hormone replacement on total recently. She is GX P2, first pregnancy to term age 73.  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 Her husband of 40+ years, Daisy Larson, is a Education officer, environmental. He underwent heart transplant at [facility] at May 2011 and is doing very well except for pain from costochondritis. Their children are Daisy Larson who lives in [place]  and is a Chief Financial Officer, and Daisy Larson,  also living in [place], who works as a Scientist, forensic. The patient has one grandchild. She attends an independent [employer]    ADVANCED DIRECTIVES: in place  HEALTH MAINTENANCE: History  Substance Use Topics  . Smoking status: Never Smoker   . Smokeless tobacco: Never Used  . Alcohol Use: No     Colonoscopy: 2014  PAP: 2012  Bone density: 2013, osteopenia  Lipid panel:  Allergies  Allergen Reactions  . Tape   . Oxymorphone     lethargic    Current Outpatient Prescriptions  Medication Sig Dispense Refill  . anastrozole (ARIMIDEX) 1 MG tablet Take 1 tablet (1 mg total) by mouth daily.  90 tablet  4  . aspirin 81 MG tablet Take 81 mg by mouth daily.      . B Complex-C (SUPER B COMPLEX PO) Take 1 tablet by mouth daily.      . B-D UF III MINI PEN NEEDLES 31G X 5 MM MISC       . buPROPion (WELLBUTRIN XL) 300 MG 24 hr tablet Take 1 tablet by mouth Daily.      . calcium-vitamin D (OSCAL WITH D) 500-200 MG-UNIT per tablet Take 1 tablet by mouth daily.      . cetirizine (ZYRTEC) 10 MG tablet Take 10 mg by mouth daily.      . Cholecalciferol 1000 UNITS capsule Take 2,000 Units by mouth daily.      Marland Kitchen CINNAMON PO Take 2 tablets by mouth daily.      . DULoxetine (CYMBALTA) 60 MG capsule Take 60 mg by mouth daily.      . furosemide (LASIX) 40 MG tablet       . gabapentin (NEURONTIN) 800 MG tablet 800 mg 3 (three) times daily.       Chilton Si Tea, Camillia sinensis, (GREEN TEA EXTRACT PO) Take 1 tablet by mouth daily. 400mg       . hydrocortisone-pramoxine (ANALPRAM-HC) 2.5-1 % rectal cream Place rectally as needed for hemorrhoids.  30 g  0  . insulin lispro protamine-insulin lispro (HUMALOG 75/25) (75-25) 100 UNIT/ML SUSP Inject 60-70 Units into the skin 2 (two) times daily with a meal. 70 units in the morning and 60 units in the evening      . lisinopril (PRINIVIL,ZESTRIL) 20 MG tablet Take 20 mg by  mouth every morning. Take in the morning      . Magnesium 400 MG CAPS Take 1 capsule by mouth daily.      . metolazone (ZAROXOLYN) 5 MG tablet       . MULTIPLE VITAMIN PO Take 1 tablet by mouth daily.      . Omega-3 Fatty Acids (EQL OMEGA 3 FISH OIL) 1400 MG CAPS Take 1 capsule by mouth daily.      Marland Kitchen omeprazole (PRILOSEC) 40 MG capsule Take 40 mg by mouth daily.      Marland Kitchen OVER THE COUNTER MEDICATION Take 1 tablet by mouth daily. "Grape seed and resveratrol"      . pramipexole (MIRAPEX) 1 MG tablet Take 1 mg by mouth at bedtime.      . propranolol (INDERAL LA) 60 MG 24 hr capsule Take 60 mg by mouth daily. Take at night      . vitamin B-12 (CYANOCOBALAMIN) 1000 MCG tablet Take 1,000 mcg by mouth daily.      Marland Kitchen acyclovir (ZOVIRAX) 400 MG tablet        No current facility-administered medications for this visit.    OBJECTIVE: Middle-aged white woman who appears fatigued and weak Filed Vitals:   02/25/13 1338  BP: 126/69  Pulse: 78  Temp: 98.1 F (36.7 C)  Resp: 20     Body mass index is 39.8 kg/(m^2).    ECOG FS: 2 Filed Weights   02/25/13 1338  Weight: 254 lb 3.2 oz (115.304 kg)   Sclerae unicteric No cervical or supraclavicular lymphadenopathy  Lungs no wheezes or rhonchi Heart regular rate and rhythm Abd obese, soft and nontender with positive bowel sounds MSK no focal spinal tenderness, nonpitting pedal edema, equal bilaterally.  Neuro: nonfocal, well oriented, anxious affect Breasts: The right breast is status post lumpectomy and radiation. No evidence of local recurrence. The left breast is unremarkable. Axillae benign bilaterally with no adenopathy palpated.  LAB RESULTS: Lab Results  Component Value Date   WBC 10.2 02/25/2013   NEUTROABS 8.3* 02/25/2013   HGB 10.8* 02/25/2013   HCT 31.9* 02/25/2013   MCV 92.2 02/25/2013   PLT 246 02/25/2013      Chemistry      Component Value Date/Time   NA 137 02/25/2013 1300   NA 139 11/04/2012 1020   K 2.4 Repeated and Verified*  02/25/2013 1300   K 3.9 11/04/2012 1020   CL 81* 02/25/2013 1300   CL 103 11/04/2012 1020   CO2 42* 02/25/2013 1300   CO2 29 11/04/2012 1020   BUN 25.9 02/25/2013 1300   BUN 10 11/04/2012 1020   CREATININE 1.2* 02/25/2013 1300   CREATININE 0.8 11/04/2012 1020      Component Value Date/Time   CALCIUM 9.8 02/25/2013 1300   CALCIUM 9.2 11/04/2012 1020   ALKPHOS 172* 02/25/2013 1300   ALKPHOS 136* 11/04/2012 1020   AST 27 02/25/2013 1300   AST 57* 11/04/2012 1020   ALT 19 02/25/2013 1300   ALT 50* 11/04/2012 1020   BILITOT 0.81 02/25/2013 1300   BILITOT 1.1 11/04/2012 1020      Lab Results  Component Value Date   LABCA2 25 01/10/2012     STUDIES:  Most recent mammogram at [facility] on 12/29/2011 was unremarkable.  Most recent bone density at [facility] on 04/04/2012 showed osteopenia.   Ct Abdomen Pelvis W Contrast  11/11/2012  *RADIOLOGY REPORT*  Clinical Data: Rectal bleeding.  History of hepatomegaly. Diverticulitis history.  History of right breast cancer.  CT ABDOMEN AND PELVIS WITH CONTRAST  Technique:  Multidetector CT imaging of the abdomen and pelvis was performed following the standard protocol during bolus administration of intravenous contrast.  Contrast: OMNIPAQUE IOHEXOL 300 MG/ML  SOLN  Comparison: None.  Findings: The liver has a diffusely nodular contour, consistent with cirrhosis.  Cranial caudal length of the liver is 19.9 cm, enlarged.  Spleen measures 14.4 cm in cranial caudal length, borderline enlarged. The portal vein, superior mesenteric vein, and splenic vein are patent.  Small para esophageal varices are evident.  The stomach, duodenum, pancreas, and adrenal glands are normal. Several tiny calcified stones are seen in the gallbladder. Cortical scarring is noted in the upper poles of each kidney, right greater than left.  2.7 cm low density lesion in the lower pole of the right kidney is compatible with a cyst.  The left kidney and is unremarkable.  No abdominal aortic aneurysm.  1.7 x 1.5  cm lymph node is seen in the gastrohepatic ligament.  3.6 x 1.9 cm hepato duodenal ligament lymph node is associated.  No evidence for free fluid in the abdomen.  Imaging through the pelvis shows no free intraperitoneal fluid. There is no pelvic sidewall lymphadenopathy.  Bladder is normal. The uterus is surgically absent.  No evidence for adnexal mass. Diffuse diverticulosis noted in the left colon without diverticulitis.  The terminal ileum is normal. The appendix is not visualized, but there is no edema or inflammation in the region of the cecum.  Bone windows reveal no worrisome lytic or sclerotic osseous lesions.  The patient is status post PLIF at L4-5.  IMPRESSION: Enlarged, nodular liver consistent with cirrhosis.  Splenic size is at upper normal. Mild para esophageal varices are evident.  Gastrohepatic and hepatoduodenal ligament mild lymphadenopathy. This may be related to the underlying chronic liver disease.   Original Report Authenticated By: Kennith Center, M.D.     ASSESSMENT: 62 year [place]   (1) status post right lumpectomy 01/19/2012 for a T1c N0, stage IA invasive mucinous carcinoma, grade 1, estrogen 99% and progesterone 100% receptor positive, with an MIB of 8, and no HER-2 amplification.  (2) completed radiation 04/01/2012  (3) on anastrozole since June 2013  (4)  iron deficiency anemia, s/p ferumoxytol x2 December 2013  (5) osteopenia, L fem neck T -1.9 on June 2013   (6)  Multiple comorbitities including uncontrolled diabetes, cirrhosis, and hypertension.  PLAN:  From a breast cancer standpoint, Daisy Larson is actually doing quite well, and will continue on her anastrozole as before. We'll also plan to see her every 6 months, and she'll receive zoledronic acid annually in February of each year.  Dr. Darnelle Catalan, however, has reviewed [name]'s labs today. She was found to be severely hypokalemic with a potassium of 2.4, and hyperglycemic with a glucose of 552.  We've recommended that she proceed immediately to the emergency Department. Since she has been seen on multiple occasions over the last several weeks, she would prefer to return to [facility], rather than [place] long period we have asked our desk nurse to contact the emergency room simply to make them aware that she is on her way.   She is already scheduled for her annual mammogram at [facility], 02/26/13. Otherwise she will see [place] again in October for repeat labs and physical exam, but she knows to call for any problems that may develop before that visit.     Laurice Iglesia    02/25/2013

## 2013-02-26 LAB — VITAMIN D 25 HYDROXY (VIT D DEFICIENCY, FRACTURES): Vit D, 25-Hydroxy: 45 ng/mL (ref 30–89)

## 2013-03-25 ENCOUNTER — Telehealth: Payer: Self-pay | Admitting: Pulmonary Disease

## 2013-03-25 NOTE — Telephone Encounter (Signed)
Per last OV, does not qualify for O2 Will not fill out CMN

## 2013-03-26 ENCOUNTER — Telehealth: Payer: Self-pay | Admitting: Pulmonary Disease

## 2013-03-26 NOTE — Telephone Encounter (Signed)
[facility] [place]-- office if currently closed

## 2013-03-27 NOTE — Telephone Encounter (Signed)
I spoke with [place] with [employer] and she states they were contacted by the pt and pt sister Harriett Sine to provide pt with a cpap machine and oxygen concentrator while the pt is visiting [place]. Larena Glassman states they provided OV ntes and a copy of DME order for cpap but that they do not have any record of oxygen. Larena Glassman states they sent over a CMN to our office for the oxygen but it was sent back stating the pt did not qualify for oxygen at last OV. I advised according to epic dr. Vassie Loll did not prescribe the pt oxygen on cpap. Per last OV note the pt came in for eval of low sats from another MD office but it does not state what doctor. Pt was tested for O2 desat in office at last OV but did not qualify.  I advised that we can complete CMN for cpap only for the pt travel but not oxygen. Larena Glassman states she will call and speak with the pt and sister to see what doctor prescribed the oxygen. Carron Curie, CMA

## 2013-03-27 NOTE — Telephone Encounter (Signed)
[email or web address].Venancio Chenier, CMA  

## 2013-03-27 NOTE — Telephone Encounter (Signed)
Daisy Larson returned triage's call.  [name]

## 2013-03-31 ENCOUNTER — Encounter: Payer: Self-pay | Admitting: Oncology

## 2013-04-18 ENCOUNTER — Telehealth: Payer: Self-pay | Admitting: *Deleted

## 2013-04-18 NOTE — Telephone Encounter (Signed)
sw pt informed her that [facility] will be on call 05/05/13. gv appt d/t for 05/12/13 with labs@12 :45pm and ov @1 :15pm...td

## 2013-05-05 ENCOUNTER — Other Ambulatory Visit: Payer: BC Managed Care – PPO | Admitting: Lab

## 2013-05-05 ENCOUNTER — Ambulatory Visit: Payer: BC Managed Care – PPO | Admitting: Oncology

## 2013-05-12 ENCOUNTER — Telehealth: Payer: Self-pay | Admitting: *Deleted

## 2013-05-12 ENCOUNTER — Other Ambulatory Visit (HOSPITAL_BASED_OUTPATIENT_CLINIC_OR_DEPARTMENT_OTHER): Payer: BC Managed Care – PPO | Admitting: Lab

## 2013-05-12 ENCOUNTER — Encounter: Payer: Self-pay | Admitting: Physician Assistant

## 2013-05-12 ENCOUNTER — Ambulatory Visit (HOSPITAL_BASED_OUTPATIENT_CLINIC_OR_DEPARTMENT_OTHER): Payer: BC Managed Care – PPO | Admitting: Physician Assistant

## 2013-05-12 VITALS — BP 125/71 | HR 73 | Temp 98.1°F | Resp 20 | Ht 67.0 in | Wt 254.9 lb

## 2013-05-12 DIAGNOSIS — K7460 Unspecified cirrhosis of liver: Secondary | ICD-10-CM

## 2013-05-12 DIAGNOSIS — C50911 Malignant neoplasm of unspecified site of right female breast: Secondary | ICD-10-CM

## 2013-05-12 DIAGNOSIS — E876 Hypokalemia: Secondary | ICD-10-CM

## 2013-05-12 DIAGNOSIS — C50411 Malignant neoplasm of upper-outer quadrant of right female breast: Secondary | ICD-10-CM

## 2013-05-12 DIAGNOSIS — C50919 Malignant neoplasm of unspecified site of unspecified female breast: Secondary | ICD-10-CM

## 2013-05-12 DIAGNOSIS — D696 Thrombocytopenia, unspecified: Secondary | ICD-10-CM

## 2013-05-12 DIAGNOSIS — C50419 Malignant neoplasm of upper-outer quadrant of unspecified female breast: Secondary | ICD-10-CM

## 2013-05-12 LAB — CBC WITH DIFFERENTIAL/PLATELET
Basophils Absolute: 0 10*3/uL (ref 0.0–0.1)
Eosinophils Absolute: 0.2 10*3/uL (ref 0.0–0.5)
HGB: 13.2 g/dL (ref 11.6–15.9)
MCV: 95.3 fL (ref 79.5–101.0)
MONO#: 0.5 10*3/uL (ref 0.1–0.9)
MONO%: 8.4 % (ref 0.0–14.0)
NEUT#: 3.7 10*3/uL (ref 1.5–6.5)
RBC: 4.06 10*6/uL (ref 3.70–5.45)
RDW: 14.3 % (ref 11.2–14.5)
WBC: 5.5 10*3/uL (ref 3.9–10.3)
lymph#: 1.2 10*3/uL (ref 0.9–3.3)

## 2013-05-12 LAB — COMPREHENSIVE METABOLIC PANEL (CC13)
Albumin: 3.2 g/dL — ABNORMAL LOW (ref 3.5–5.0)
BUN: 11.8 mg/dL (ref 7.0–26.0)
CO2: 29 mEq/L (ref 22–29)
Calcium: 10.2 mg/dL (ref 8.4–10.4)
Chloride: 97 mEq/L — ABNORMAL LOW (ref 98–109)
Glucose: 391 mg/dl — ABNORMAL HIGH (ref 70–140)
Potassium: 4.5 mEq/L (ref 3.5–5.1)
Sodium: 137 mEq/L (ref 136–145)
Total Protein: 7.7 g/dL (ref 6.4–8.3)

## 2013-05-12 MED ORDER — ANASTROZOLE 1 MG PO TABS: 1.0000 mg | ORAL_TABLET | Freq: Every day | ORAL | Status: DC

## 2013-05-12 NOTE — Telephone Encounter (Signed)
Per staff message and POF I have scheduled appts.  JMW  

## 2013-05-12 NOTE — Telephone Encounter (Signed)
appts made and printed. Pt is aware that her tx will be added for 12/01/13. i emailed MW to add the tx...td

## 2013-05-12 NOTE — Progress Notes (Signed)
ID: Daisy Larson   DOB: 07-Jul-1951  MR#: [id number]  WUJ#:[id number]  HISTORY OF PRESENT ILLNESS: The patient had annual mammography 12/29/2011 at [facility], showing  an 11 mm focal nodular density in the upper outer quadrant of the right breast which had increased in size as compared to the prior study a year prior. Ultrasound showed no sonographic abnormality. 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 The patient underwent bilateral breast MRI 01/09/2012 showing a solitary enhancing mass in the lateral aspect of the right breast measuring 1.3 cm. There was no enlarged axillary or internal mammary adenopathy and no other abnormality was noted.  The patient was seen at the multidisciplinary breast cancer clinic 01/10/2012 with further treatments as detailed below  INTERVAL HISTORY:  Daisy Larson returns today with her husband Daisy Larson for followup of her right breast cancer. She continues on anastrozole which she is tolerating well. She also has a history of osteopenia, and was started on zoledronic acid in February 2014.   Daisy Larson continues to have problems with her multiple comorbidities and in fact has been hospitalized twice in [facility] since her visit here in April she tells me. One of the hospitalizations was secondary to encephalopathy per her report. She continues to be followed closely by Dr. Marcelene Larson at [facility] for history of cirrhosis. She's currently on lactulose 3 times daily and this is elevating her blood sugars, and she continues to be followed by Dr. Kirt Larson for her diabetes.  Otherwise, interval history is also remarkable for Daisy Larson having stepped in a "Pot hole" in [place] when visiting her sisters in May. She fractured her right foot and is still in a  walking boot.   REVIEW OF SYSTEMS:  Daisy Larson denies any recent fevers, chills, or night sweats. Her appetite is good and she denies any significant nausea or change in bowel or bladder habits. . She  still has an occasional dry cough  and continues to have shortness of breath which is chronic.  she uses a CPAP at night. She's had no abnormal headaches or change in vision. She does have chronic back pain, but denies any new or unusual myalgias, arthralgias, or bony pain.  A detailed review of systems is otherwise stable and noncontributory.   PAST MEDICAL HISTORY: Past Medical History  Diagnosis Date  . Cirrhosis   . Multiple thyroid nodules   . History of stomach ulcers   . Hernia   . Colon polyp   . Neuropathy of foot   . Restless legs   . Complication of anesthesia     WITH SECON HERNIA REPAIR 2012 [facility]  DIFFICULTY O2 SAT DROPPING ADMIT TO HOSPITAL   . Bundle branch block   . Hypertension   . Angina     COSTOCONDRITIS    . Shortness of breath     WITH EXERTION   . Diabetes mellitus   . Hepatitis      Did Not have hepatitis but needed Hep A and Hep B injections2012 SPOTS ON LIVER  DR Daisy Larson  [phone]  . GERD (gastroesophageal reflux disease)     ULCERS  . Hyperthyroidism     NODULES ON THYROID  (DR BALEN 37  12-609)  . Goiter   . Headache(784.0)     MIGRAINES  . Neuromuscular disorder  PERIPH NEUROPATHY   . Anxiety   . Depression   . Breast cancer 01/19/12     right breast lumpectomy/ER/PR positibe,her-2 neg.  . Allergy     tape  . S/P radiation therapy 02/19/12 - 04/01/12    Right Breast: 5000 cgy/25 Fractions with Boost/ 1000 cGy/5 Fractions  . Iron deficiency anemia 10/16/2012  . Heart murmur   . Ulcer   She underwent liver biopsy 03/28/2011, at [facility], showing evidence of early cirrhosis. There was mildly active chronic hepatitis (grade 1). She also has a history of thyroid nodules which have been stable on serial ultrasounds according to  the patient  PAST SURGICAL HISTORY: Past Surgical History  Procedure Laterality Date  . Appendectomy    . Tonsillectomy    . Cesarean section    . Tubal ligation    . Abdominal hysterectomy    . Back surgery    . Hernia repair      X 2  . Anal fissure repair    . Carpal tunnel release      LEFT   . Colonoscopy w/ polypectomy    . Breast surgery      BIOPSY  Right Breast -  Sentinel Lymph Nodes  . Liver biopsy  03/28/11    [facility] - Cirrhosis  . Spinal fusion    . Colonoscopy      FAMILY HISTORY Family History  Problem Relation Age of Onset  . Lung cancer Maternal Grandfather   . Colon cancer Paternal Grandfather   . Diabetes Mother   . Heart disease Maternal Uncle   . Diabetes Sister    the patient's father died at the age of 48, following a fall. The patient's mother died at the age of 84. 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 The patient had no brothers, 3 sisters, one of whom is present at the initial visit. There is no history of breast or ovarian cancer in the family, but one niece was diagnosed with colon cancer at the age of 41. Her paternal grandfather died from colon cancer. Her maternal grandfather died from lung cancer.  GYNECOLOGIC HISTORY: She had menarche at age 33. She went through menopause approximately age 62. She took hormone replacement on total recently. She is GX P2, first pregnancy to term age 29.  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 Her husband of 40+ years, Daisy Larson, is a Education officer, environmental. He underwent heart transplant at [facility] at May 2011 and is doing very well except for pain from costochondritis. Their children are Daisy Larson who lives in [place] and is a Chief Financial Officer, and Daisy Larson,  also living in [place], who works as a Scientist, forensic. The patient has one grandchild. She attends an independent [employer]    ADVANCED DIRECTIVES: in place  HEALTH MAINTENANCE: History  Substance Use Topics  . Smoking status: Never Smoker   . Smokeless tobacco: Never Used  . Alcohol Use: No     Colonoscopy: 2014  PAP: 2012  Bone density: 2013, osteopenia  Lipid panel:  Allergies  Allergen Reactions  . Other     Per Patient - she has been told she can not have general anesthesia "unless life-threatening"  . Tape   . Oxymorphone     lethargic    Current Outpatient Prescriptions  Medication Sig Dispense Refill  . anastrozole (ARIMIDEX) 1 MG tablet Take 1 tablet (1  mg total) by mouth daily.  90 tablet  4  . aspirin 81 MG tablet Take 81 mg by mouth daily.      . B Complex-C (SUPER B COMPLEX PO) Take 1 tablet by mouth daily.      . B-D UF III MINI PEN NEEDLES 31G X 5 MM MISC       . buPROPion (WELLBUTRIN XL) 300 MG 24 hr tablet Take 1 tablet by mouth Daily.      . calcium-vitamin D (OSCAL WITH D) 500-200 MG-UNIT per tablet Take 1 tablet by mouth daily.      . cetirizine (ZYRTEC) 10 MG tablet Take 10 mg by mouth daily.      . Cholecalciferol 1000 UNITS capsule Take 2,000 Units by mouth daily.      Marland Kitchen CINNAMON PO Take 2 tablets by mouth daily.      . citalopram (CELEXA) 10 MG tablet 20 mg.       . DULoxetine (CYMBALTA) 60 MG capsule Take 60 mg by mouth daily.      . fluconazole (DIFLUCAN) 100 MG tablet Take 100 mg by mouth daily.      . furosemide (LASIX) 40 MG tablet       . gabapentin (NEURONTIN) 800 MG tablet 800 mg 3 (three) times daily.       [place] Si Tea, Camillia sinensis, (GREEN TEA EXTRACT PO) Take 1 tablet by mouth daily. 400mg       . hydrocortisone-pramoxine (ANALPRAM-HC) 2.5-1 % rectal cream Place rectally as needed for hemorrhoids.  30 g  0  . insulin lispro protamine-insulin lispro (HUMALOG 75/25) (75-25) 100 UNIT/ML SUSP Inject 60-70 Units into the skin 2 (two) times daily with a meal. 70 units in the morning and 60 units in the evening      . lactulose (CEPHULAC) 20 G packet Take  20 g by mouth 3 (three) times daily.      Marland Kitchen lisinopril (PRINIVIL,ZESTRIL) 20 MG tablet Take 20 mg by mouth every morning. Take in the morning      . Magnesium 400 MG CAPS Take 1 capsule by mouth daily.      . meclizine (ANTIVERT) 12.5 MG tablet Take 12.5 mg by mouth 3 (three) times daily as needed.      . MULTIPLE VITAMIN PO Take 1 tablet by mouth daily.      . Omega-3 Fatty Acids (EQL OMEGA 3 FISH OIL) 1400 MG CAPS Take 1 capsule by mouth daily.      Marland Kitchen omeprazole (PRILOSEC) 40 MG capsule Take 40 mg by mouth daily.      Marland Kitchen OVER THE COUNTER MEDICATION Take 1 tablet by mouth daily. "Grape seed and resveratrol"      . polyethylene glycol (MIRALAX / GLYCOLAX) packet Take 17 g by mouth daily.      . pramipexole (MIRAPEX) 1 MG tablet Take 1 mg by mouth at bedtime.      . propranolol (INDERAL LA) 60 MG 24 hr capsule Take 60 mg by mouth daily. Take at night      . spironolactone (ALDACTONE) 50 MG tablet       . traMADol (ULTRAM) 50 MG tablet Take 50 mg by mouth every 6 (six) hours as needed for pain.      . vitamin B-12 (CYANOCOBALAMIN) 1000 MCG tablet Take 1,000 mcg by mouth daily.      Marland Kitchen XIFAXAN 550 MG TABS       . B-D INS SYR ULTRAFINE 1CC/31G 31G X 5/16" 1 ML  MISC       . BAYER CONTOUR NEXT TEST test strip        No current facility-administered medications for this visit.    OBJECTIVE: Middle-aged white woman who appears tired but is in no acute distress Filed Vitals:   05/12/13 1324  BP: 125/71  Pulse: 73  Temp: 98.1 F (36.7 C)  Resp: 20     Body mass index is 39.91 kg/(m^2).    ECOG FS: 2 Filed Weights   05/12/13 1324  Weight: 254 lb 14.4 oz (115.622 kg)   Sclerae unicteric Oropharynx is clear No cervical or supraclavicular lymphadenopathy  Lungs clear to auscultation, no wheezes or rhonchi Heart regular rate and rhythm Abdomen obese, nondistended, soft and nontender with positive bowel sounds MSK no focal spinal tenderness, No peripheral edema. The right foot is in a walking  boot secondary to recent fracture Neuro: nonfocal, well oriented, anxious affect Breasts: The right breast is status post lumpectomy and radiation. No evidence of local recurrence. The left breast is unremarkable. Axillae benign bilaterally with no adenopathy palpated.   LAB RESULTS: Lab Results  Component Value Date   WBC 5.5 05/12/2013   NEUTROABS 3.7 05/12/2013   HGB 13.2 05/12/2013   HCT 38.7 05/12/2013   MCV 95.3 05/12/2013   PLT 111* 05/12/2013      Chemistry      Component Value Date/Time   NA 137 05/12/2013 1246   NA 139 11/04/2012 1020   K 4.5 05/12/2013 1246   K 3.9 11/04/2012 1020   CL 81* 02/25/2013 1300   CL 103 11/04/2012 1020   CO2 29 05/12/2013 1246   CO2 29 11/04/2012 1020   BUN 11.8 05/12/2013 1246   BUN 10 11/04/2012 1020   CREATININE 1.1 05/12/2013 1246   CREATININE 0.8 11/04/2012 1020      Component Value Date/Time   CALCIUM 10.2 05/12/2013 1246   CALCIUM 9.2 11/04/2012 1020   ALKPHOS 226* 05/12/2013 1246   ALKPHOS 136* 11/04/2012 1020   AST 67* 05/12/2013 1246   AST 57* 11/04/2012 1020   ALT 67* 05/12/2013 1246   ALT 50* 11/04/2012 1020   BILITOT 0.88 05/12/2013 1246   BILITOT 1.1 11/04/2012 1020      Lab Results  Component Value Date   LABCA2 25 01/10/2012     STUDIES:  Most recent mammogram at [facility] on 03/13/2013 was unremarkable.  Most recent bone density at [facility] on 04/04/2012 showed osteopenia.     ASSESSMENT: 62 year [place]   (1) status post right lumpectomy 01/19/2012 for a T1c N0, stage IA invasive mucinous carcinoma, grade 1, estrogen 99% and progesterone 100% receptor positive, with an MIB of 8, and no HER-2 amplification.  (2) completed radiation 04/01/2012  (3) on anastrozole since June 2013  (4)  iron deficiency anemia, s/p ferumoxytol x2 December 2013  (5) osteopenia, L fem neck T -1.9 on June 2013.  Receives zoledronic acid on an annual basis, first dose in February 2014   (6)  Multiple comorbitities including uncontrolled  diabetes, cirrhosis, and hypertension.  PLAN:  From a breast cancer standpoint, Daisy Larson continues to do extremely well, with no clinical evidence of disease recurrence. She will continue on anastrozole which I have refilled for her today. She'll return to see [place] again in 6 months for repeat labs and physical exam, and is due for her next annual dose of zoledronic acid in February 2015.  She'll continue to follow very closely with her primary care physician, Dr.  Montez Morita, and her gastroenterologist, Dr. Marcelene Larson.   Per patient request, I have faxed Daisy Larson's lab results from today to both physicians.   Daisy Larson voices understanding and agreement with this plan. She knows to call for any problems that may develop before that visit.     Arriyah Madej    05/12/2013

## 2013-08-07 LAB — GLUCOSE, POCT (MANUAL RESULT ENTRY): POC Glucose: 291 mg/dl — AB (ref 70–99)

## 2013-08-08 ENCOUNTER — Other Ambulatory Visit: Payer: Self-pay | Admitting: Endocrinology

## 2013-08-08 DIAGNOSIS — E049 Nontoxic goiter, unspecified: Secondary | ICD-10-CM

## 2013-08-18 ENCOUNTER — Ambulatory Visit
Admission: RE | Admit: 2013-08-18 | Discharge: 2013-08-18 | Disposition: A | Discharge status: Home / Self Care | Payer: BC Managed Care – PPO | Source: Ambulatory Visit | Attending: Endocrinology | Admitting: Endocrinology

## 2013-08-18 DIAGNOSIS — E049 Nontoxic goiter, unspecified: Secondary | ICD-10-CM

## 2013-09-09 ENCOUNTER — Other Ambulatory Visit: Payer: Self-pay | Admitting: Endocrinology

## 2013-09-09 DIAGNOSIS — E041 Nontoxic single thyroid nodule: Secondary | ICD-10-CM

## 2013-09-16 ENCOUNTER — Other Ambulatory Visit (HOSPITAL_COMMUNITY)
Admission: RE | Admit: 2013-09-16 | Discharge: 2013-09-16 | Disposition: A | Discharge status: Home / Self Care | Payer: BC Managed Care – PPO | Source: Ambulatory Visit | Attending: Interventional Radiology | Admitting: Interventional Radiology

## 2013-09-16 ENCOUNTER — Ambulatory Visit
Admission: RE | Admit: 2013-09-16 | Discharge: 2013-09-16 | Disposition: A | Discharge status: Home / Self Care | Payer: BC Managed Care – PPO | Source: Ambulatory Visit | Attending: Endocrinology | Admitting: Endocrinology

## 2013-09-16 DIAGNOSIS — E041 Nontoxic single thyroid nodule: Secondary | ICD-10-CM

## 2013-09-24 LAB — GLUCOSE, POCT (MANUAL RESULT ENTRY): POC Glucose: 171 mg/dl — AB (ref 70–99)

## 2013-11-10 ENCOUNTER — Encounter: Payer: Self-pay | Admitting: *Deleted

## 2013-11-10 ENCOUNTER — Encounter: Payer: Self-pay | Admitting: Gastroenterology

## 2013-11-13 ENCOUNTER — Telehealth: Payer: Self-pay | Admitting: *Deleted

## 2013-11-13 ENCOUNTER — Other Ambulatory Visit (HOSPITAL_BASED_OUTPATIENT_CLINIC_OR_DEPARTMENT_OTHER): Payer: BC Managed Care – PPO

## 2013-11-13 ENCOUNTER — Ambulatory Visit (HOSPITAL_BASED_OUTPATIENT_CLINIC_OR_DEPARTMENT_OTHER): Payer: BC Managed Care – PPO | Admitting: Physician Assistant

## 2013-11-13 ENCOUNTER — Ambulatory Visit: Payer: BC Managed Care – PPO | Admitting: Oncology

## 2013-11-13 ENCOUNTER — Encounter: Payer: Self-pay | Admitting: Physician Assistant

## 2013-11-13 VITALS — BP 160/84 | HR 87 | Temp 97.4°F | Resp 18 | Ht 67.0 in | Wt 225.4 lb

## 2013-11-13 DIAGNOSIS — I10 Essential (primary) hypertension: Secondary | ICD-10-CM

## 2013-11-13 DIAGNOSIS — IMO0001 Reserved for inherently not codable concepts without codable children: Secondary | ICD-10-CM

## 2013-11-13 DIAGNOSIS — C50419 Malignant neoplasm of upper-outer quadrant of unspecified female breast: Secondary | ICD-10-CM

## 2013-11-13 DIAGNOSIS — M899 Disorder of bone, unspecified: Secondary | ICD-10-CM

## 2013-11-13 DIAGNOSIS — N63 Unspecified lump in unspecified breast: Secondary | ICD-10-CM

## 2013-11-13 DIAGNOSIS — E1165 Type 2 diabetes mellitus with hyperglycemia: Secondary | ICD-10-CM

## 2013-11-13 DIAGNOSIS — N644 Mastodynia: Secondary | ICD-10-CM

## 2013-11-13 DIAGNOSIS — D696 Thrombocytopenia, unspecified: Secondary | ICD-10-CM

## 2013-11-13 DIAGNOSIS — E1142 Type 2 diabetes mellitus with diabetic polyneuropathy: Secondary | ICD-10-CM

## 2013-11-13 DIAGNOSIS — M949 Disorder of cartilage, unspecified: Secondary | ICD-10-CM

## 2013-11-13 DIAGNOSIS — D509 Iron deficiency anemia, unspecified: Secondary | ICD-10-CM

## 2013-11-13 DIAGNOSIS — M8580 Other specified disorders of bone density and structure, unspecified site: Secondary | ICD-10-CM | POA: Insufficient documentation

## 2013-11-13 DIAGNOSIS — E1149 Type 2 diabetes mellitus with other diabetic neurological complication: Secondary | ICD-10-CM

## 2013-11-13 DIAGNOSIS — C50411 Malignant neoplasm of upper-outer quadrant of right female breast: Secondary | ICD-10-CM

## 2013-11-13 DIAGNOSIS — Z170 Estrogen receptor positive status [ER+]: Secondary | ICD-10-CM

## 2013-11-13 DIAGNOSIS — K7460 Unspecified cirrhosis of liver: Secondary | ICD-10-CM

## 2013-11-13 DIAGNOSIS — Z853 Personal history of malignant neoplasm of breast: Secondary | ICD-10-CM

## 2013-11-13 LAB — CBC WITH DIFFERENTIAL/PLATELET
BASO%: 0.9 % (ref 0.0–2.0)
Basophils Absolute: 0.1 10*3/uL (ref 0.0–0.1)
EOS ABS: 0.2 10*3/uL (ref 0.0–0.5)
EOS%: 2.8 % (ref 0.0–7.0)
HCT: 41.8 % (ref 34.8–46.6)
HEMOGLOBIN: 14.1 g/dL (ref 11.6–15.9)
LYMPH#: 1.5 10*3/uL (ref 0.9–3.3)
LYMPH%: 22.9 % (ref 14.0–49.7)
MCH: 32.5 pg (ref 25.1–34.0)
MCHC: 33.7 g/dL (ref 31.5–36.0)
MCV: 96.5 fL (ref 79.5–101.0)
MONO#: 0.4 10*3/uL (ref 0.1–0.9)
MONO%: 6.4 % (ref 0.0–14.0)
NEUT#: 4.4 10*3/uL (ref 1.5–6.5)
NEUT%: 67 % (ref 38.4–76.8)
Platelets: 116 10*3/uL — ABNORMAL LOW (ref 145–400)
RBC: 4.34 10*6/uL (ref 3.70–5.45)
RDW: 13.6 % (ref 11.2–14.5)
WBC: 6.5 10*3/uL (ref 3.9–10.3)

## 2013-11-13 LAB — COMPREHENSIVE METABOLIC PANEL (CC13)
ALBUMIN: 3.5 g/dL (ref 3.5–5.0)
ALT: 58 U/L — ABNORMAL HIGH (ref 0–55)
AST: 61 U/L — ABNORMAL HIGH (ref 5–34)
Alkaline Phosphatase: 186 U/L — ABNORMAL HIGH (ref 40–150)
Anion Gap: 10 mEq/L (ref 3–11)
BUN: 14.1 mg/dL (ref 7.0–26.0)
CALCIUM: 10.2 mg/dL (ref 8.4–10.4)
CHLORIDE: 98 meq/L (ref 98–109)
CO2: 30 meq/L — AB (ref 22–29)
Creatinine: 1.1 mg/dL (ref 0.6–1.1)
GLUCOSE: 223 mg/dL — AB (ref 70–140)
POTASSIUM: 4.5 meq/L (ref 3.5–5.1)
SODIUM: 139 meq/L (ref 136–145)
TOTAL PROTEIN: 7.7 g/dL (ref 6.4–8.3)
Total Bilirubin: 1.19 mg/dL (ref 0.20–1.20)

## 2013-11-13 NOTE — Progress Notes (Signed)
ID: Daisy Larson   DOB: [birth year]  MR#: [id number]  CSN#:[id number]  PCP:  Gildardo Cranker, MD GYN:   SUR:  Madilyn Hook, OD RADONC:  Eppie Gibson, MD OTHER:  Bevelyn Buckles, MD;  Kara Mead, MD  CHIEF COMPLAINT:  Hs of Right Breast Cancer    HISTORY OF PRESENT ILLNESS: The patient had annual mammography 12/29/2011 at [place], showing  an 11 mm focal nodular density in the upper outer quadrant of the right breast which had increased in size as compared to the prior study a year prior. Ultrasound showed no sonographic abnormality. 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 The patient underwent bilateral breast MRI 01/09/2012 showing a solitary enhancing mass in the lateral aspect of the right breast measuring 1.3 cm. There was no enlarged axillary or internal mammary adenopathy and no other abnormality was noted.  The patient was seen at the multidisciplinary breast cancer clinic 01/10/2012 with further treatments as detailed below  INTERVAL HISTORY:  Daisy Larson returns today with her husband Vicente Serene for followup of her right breast cancer. She continues on anastrozole which she is tolerating well. She has no problems with hot flashes. She's had no increased vaginal dryness, and denies any abnormal vaginal bleeding. She has chronic back pain and joint pain associated with arthritis, but these are stable and have not worsened since starting the anastrozole. She does have a history of osteopenia, and was started on zoledronic acid in February 2014. Her second annual dose is scheduled for 12/24/2013.  Interval history is notable for the fact that Daisy Larson recently felt a "lump" in the upper portion of the right breast, and has had intermittent pain in that area now for 2 weeks. It is  not improving. She denies any known injuries which could have caused bruising. She's had no skin changes and denies any additional abnormalities. Her last mammogram was in May of 2014 at [place].  Otherwise, Daisy Larson continues to have problems with her multiple comorbidities, especially her cirrhosis. She has had 3 opinions from gastroenterologists, and in fact is scheduled to see Dr. Sharlett Iles again in the next couple of weeks. She certainly does not like what she has been told about the cirrhosis, basically, she tells me,  that she "may live 64 years".  She also has poorly controlled diabetes and has developed diabetic neuropathy. She's having some changes in her feet diagnosed as diabetic charcot which is being followed by an orthopedist. They're making her "braces" to wear on her feet.  REVIEW OF SYSTEMS:  Daisy Larson denies any recent fevers, chills, or night sweats. Her appetite is good and she denies any significant nausea or recent change in bowel or bladder habits.  She saw a dentist 2 months ago and tells me her teeth are "okay". She's had no dental pain, no procedures, and no extractions. She denies any jaw pain. She has some sinus congestion, occasional sore throat, postnasal drip, and a hoarse voice. She has shortness of breath which is chronic and stable. She denies any current cough, phlegm production, chest pain, or palpitations. She has a history of a right bundle branch block.  She denies any abnormal headaches or dizziness. She feels weak and forgetful at times. Her energy level is low. She admits to having some anxiety and depression, but denies suicidal ideation.  A detailed review  of systems is otherwise stable and noncontributory.   PAST MEDICAL HISTORY: Past Medical History  Diagnosis Date  . Cirrhosis   . Multiple thyroid nodules   . History of stomach ulcers   . Hernia   . Colon polyps 12/02/2012    TUBULAR ADENOMAS (X2) and Hyperplastic (X1)  . Neuropathy of foot   .  Restless legs   . Complication of anesthesia     WITH SECON HERNIA REPAIR 2012 [facility]  DIFFICULTY O2 SAT DROPPING ADMIT TO HOSPITAL   . Bundle branch block   . Hypertension   . Angina     COSTOCONDRITIS    . Shortness of breath     WITH EXERTION   . Diabetes mellitus   . Hepatitis      Did Not have hepatitis but needed Hep A and Hep B injections2012 SPOTS ON LIVER  DR Alonza Bogus  [phone]  . GERD (gastroesophageal reflux disease)     ULCERS  . Hyperthyroidism     NODULES ON THYROID  (DR BALEN 37  12-609)  . Goiter   . Headache(784.0)     MIGRAINES  . Neuromuscular disorder     PERIPH NEUROPATHY   . Anxiety   . Depression   . Breast cancer 01/19/12     right breast lumpectomy/ER/PR positibe,her-2 neg.  . Allergy     tape  . S/P radiation therapy 02/19/12 - 04/01/12    Right Breast: 5000 cgy/25 Fractions with Boost/ 1000 cGy/5 Fractions  . Iron deficiency anemia 10/16/2012  . Heart murmur   . Gastritis   She underwent liver biopsy 03/28/2011, at [facility], showing evidence of early cirrhosis. There was mildly active chronic hepatitis (grade 1). She also has a history of thyroid nodules which have been stable on serial ultrasounds according to the patient  PAST SURGICAL HISTORY: Past Surgical History  Procedure Laterality Date  . Appendectomy    . Tonsillectomy    . Cesarean section    . Tubal ligation    . Abdominal hysterectomy    . Back surgery    . Hernia repair      X 2  . Anal fissure repair    . Carpal tunnel release      LEFT   . Colonoscopy w/ polypectomy  12/02/2012  . Breast surgery      BIOPSY  Right Breast -  Sentinel Lymph Nodes  . Liver biopsy  03/28/11    [facility] - Cirrhosis  . Spinal fusion    . Esophagogastroduodenoscopy  12/02/2012    Gastritis    FAMILY HISTORY Family History  Problem Relation Age of Onset  . Lung cancer Maternal Grandfather   . Colon cancer Paternal Grandfather   . Diabetes Mother   . Heart  disease Maternal Uncle   . Diabetes Sister    the patient's father died at the age of 96, following a fall. The patient's mother died at the age of 16. 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 The patient had no brothers, 3 sisters, one of whom is present at the initial visit. There is no history of breast or ovarian cancer in the family, but one niece was diagnosed with colon cancer at the age of 28. Her paternal grandfather died from colon cancer. Her maternal grandfather died from lung cancer.  GYNECOLOGIC HISTORY: She had menarche at age 4. She went through menopause approximately age 66. She took hormone replacement on total recently. She is  GX P2, first pregnancy to term age 3.  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 Her husband of 40+ years, Vicente Serene, is a Theme park manager. He underwent heart transplant at [facility] at May 2011 and is doing very well except for pain from costochondritis. Their children are Aliviya Schoeller who lives in [place] and is a Geophysicist/field seismologist, and [name],  also living in [place], who works as a Physiological scientist. The patient has one grandchild. She attends an independent [employer]: in place  HEALTH MAINTENANCE: (Updated January 2015) History  Substance Use Topics  . Smoking status: Never Smoker   . Smokeless tobacco: Never Used  . Alcohol Use: No     Colonoscopy: 2014, Dr. Sharlett Iles  PAP: November 2014  Bone density: June 2013 at [facility], osteopenia with a T    score of -1.9  Lipid panel:  Not on file, Dr. Eulas Post  Allergies  Allergen Reactions  . Other     Per Patient - she has been told she can not have general anesthesia "unless life-threatening"  . Tape   . Oxymorphone     lethargic    Current Outpatient Prescriptions  Medication Sig Dispense Refill  . anastrozole (ARIMIDEX) 1 MG tablet Take 1  tablet (1 mg total) by mouth daily.  90 tablet  4  . aspirin 81 MG tablet Take 81 mg by mouth daily.      . B Complex-C (SUPER B COMPLEX PO) Take 1 tablet by mouth daily.      . B-D INS SYR ULTRAFINE 1CC/31G 31G X 5/16" 1 ML MISC       . B-D UF III MINI PEN NEEDLES 31G X 5 MM MISC       . BAYER CONTOUR NEXT TEST test strip       . buPROPion (WELLBUTRIN XL) 300 MG 24 hr tablet Take 1 tablet by mouth Daily.      . calcium-vitamin D (OSCAL WITH D) 500-200 MG-UNIT per tablet Take 1 tablet by mouth daily.      . cetirizine (ZYRTEC) 10 MG tablet Take 10 mg by mouth daily.      . Cholecalciferol 1000 UNITS capsule Take 2,000 Units by mouth daily.      Marland Kitchen CINNAMON PO Take 2 tablets by mouth daily.      . citalopram (CELEXA) 10 MG tablet 20 mg.       . DULoxetine (CYMBALTA) 60 MG capsule Take 60 mg by mouth daily.      . furosemide (LASIX) 40 MG tablet       . gabapentin (NEURONTIN) 800 MG tablet 800 mg 3 (three) times daily.       Nyoka Cowden Tea, Camillia sinensis, (GREEN TEA EXTRACT PO) Take 1 tablet by mouth daily. 424m      . hydrocortisone-pramoxine (ANALPRAM-HC) 2.5-1 % rectal cream Place rectally as needed for hemorrhoids.  30 g  0  . lactulose (CEPHULAC) 20 G packet Take 20 g by mouth 3 (three) times daily.      .Marland KitchenLEVEMIR FLEXTOUCH 100 UNIT/ML Pen       . lisinopril (PRINIVIL,ZESTRIL) 20 MG tablet Take 20 mg by mouth every morning. Take in the morning      . Magnesium 400 MG CAPS Take 1 capsule by mouth daily.      . MULTIPLE VITAMIN PO Take 1 tablet by mouth daily.      .Marland Kitchen  NOVOLOG FLEXPEN 100 UNIT/ML FlexPen       . Omega-3 Fatty Acids (EQL OMEGA 3 FISH OIL) 1400 MG CAPS Take 1 capsule by mouth daily.      Marland Kitchen omeprazole (PRILOSEC) 40 MG capsule Take 40 mg by mouth daily.      Marland Kitchen OVER THE COUNTER MEDICATION Take 1 tablet by mouth daily. "Grape seed and resveratrol"      . polyethylene glycol (MIRALAX / GLYCOLAX) packet Take 17 g by mouth daily.      . pramipexole (MIRAPEX) 1 MG tablet Take 2 mg  by mouth at bedtime.       . propranolol (INDERAL LA) 60 MG 24 hr capsule Take 60 mg by mouth daily. Take at night      . spironolactone (ALDACTONE) 50 MG tablet       . traMADol (ULTRAM) 50 MG tablet Take 50 mg by mouth every 6 (six) hours as needed for pain.      . vitamin B-12 (CYANOCOBALAMIN) 1000 MCG tablet Take 1,000 mcg by mouth daily.      Marland Kitchen XIFAXAN 550 MG TABS       . meclizine (ANTIVERT) 12.5 MG tablet Take 12.5 mg by mouth 3 (three) times daily as needed.       No current facility-administered medications for this visit.    OBJECTIVE: Middle-aged white woman who appears tired and anxious  Filed Vitals:   11/13/13 1436  BP: 160/84  Pulse: 87  Temp: 97.4 F (36.3 C)  Resp: 18     Body mass index is 35.29 kg/(m^2).    ECOG FS: 2 Filed Weights   11/13/13 1436  Weight: 225 lb 6.4 oz (102.241 kg)   Physical Exam: HEENT:  Sclerae anicteric.  Good dentition. Oropharynx clear and moist. No evidence of ulcerations or oropharyngeal candidiasis. No pharyngeal erythema. Neck is supple.  NODES:  No cervical or supraclavicular lymphadenopathy palpated.  BREAST EXAM:  Right breast is status post lumpectomy. There is obvious pain to palpation in the upper portion of the breast, especially at the 12:00 position. I was unable to palpate a distinct lump today.  Left breast is unremarkable. Axillae are benign bilaterally, with no palpable lymphadenopathy. LUNGS: Breath sounds are decreased bilaterally in the bases. No dullness to percussion.  No wheezes or rhonchi HEART:  Regular rate and rhythm. No murmur  ABDOMEN:  Soft, obese, with tenderness to palpation of the right upper quadrant. Positive bowel sounds.  MSK:  No focal spinal tenderness to palpation. Good range of motion bilaterally in the upper extremities.  EXTREMITIES:  Nonpitting pedal edema, equal bilaterally. No upper extremity edema noted. SKIN:  Benign with no visible rashes or skin lesions. No nail dyscrasia or clubbing. NEURO:   Nonfocal. Well oriented.  Appropriate affect.     LAB RESULTS: Lab Results  Component Value Date   WBC 6.5 11/13/2013   NEUTROABS 4.4 11/13/2013   HGB 14.1 11/13/2013   HCT 41.8 11/13/2013   MCV 96.5 11/13/2013   PLT 116* 11/13/2013      Chemistry      Component Value Date/Time   NA 139 11/13/2013 1423   NA 139 11/04/2012 1020   K 4.5 11/13/2013 1423   K 3.9 11/04/2012 1020   CL 81* 02/25/2013 1300   CL 103 11/04/2012 1020   CO2 30* 11/13/2013 1423   CO2 29 11/04/2012 1020   BUN 14.1 11/13/2013 1423   BUN 10 11/04/2012 1020   CREATININE 1.1 11/13/2013 1423  CREATININE 0.8 11/04/2012 1020      Component Value Date/Time   CALCIUM 10.2 11/13/2013 1423   CALCIUM 9.2 11/04/2012 1020   ALKPHOS 186* 11/13/2013 1423   ALKPHOS 136* 11/04/2012 1020   AST 61* 11/13/2013 1423   AST 57* 11/04/2012 1020   ALT 58* 11/13/2013 1423   ALT 50* 11/04/2012 1020   BILITOT 1.19 11/13/2013 1423   BILITOT 1.1 11/04/2012 1020        STUDIES:  Most recent mammogram at [facility] on 03/13/2013 was unremarkable.  Most recent bone density at [facility] on 04/04/2012 showed osteopenia, with T score of -1.9.    ASSESSMENT: 63 year [place]   (1) status post right lumpectomy 01/19/2012 for a T1c N0, stage IA invasive mucinous carcinoma, grade 1, estrogen 99% and progesterone 100% receptor positive, with an MIB of 8, and no HER-2 amplification.  (2) completed radiation 04/01/2012  (3) on anastrozole since June 2013  (4)  iron deficiency anemia, s/p ferumoxytol x2 December 2013  (5) osteopenia, L fem neck T -1.9 on June 2013.  Receives zoledronic acid on an annual basis, first dose in February 2014   (6)  Multiple comorbitities including uncontrolled diabetes, cirrhosis, and hypertension.  PLAN:  Overall, Daisy Larson appears to be doing well, although she is very concerned about the acute onset of increased pain in the right breast. Again, although I was unable to palpate a distinct lump, the patient herself has  felt this abnormality on multiple occasions over the past 2 weeks. We will send her for a right diagnostic mammogram and right breast ultrasound for further evaluation at [facility] at their next available appointment.    If those studies are unremarkable, she will be due for her next bilateral diagnostic mammogram in May, and we will ask for her next bone density to be scheduled to at the same time.  We would then see her for repeat labs and physical exam 6 months from now, July 2015.  In the meanwhile, she will continue on the anastrozole and will receive zoledronic acid annually. Her next infusion of zoledronic acid is already scheduled for next month, 12/24/2013.  All this was reviewed in detail with Daisy Larson and Vicente Serene today, both of whom voice understanding and agreement. They know to call with any changes or problems.  Of course she'll continue to be followed very closely by her other physicians.      Hetal Proano  PA-C   11/13/2013

## 2013-11-13 NOTE — Telephone Encounter (Signed)
appts made and printed. gv appts for [facility]...td

## 2013-11-14 ENCOUNTER — Encounter: Payer: Self-pay | Admitting: Gastroenterology

## 2013-11-14 ENCOUNTER — Other Ambulatory Visit (INDEPENDENT_AMBULATORY_CARE_PROVIDER_SITE_OTHER): Payer: BC Managed Care – PPO

## 2013-11-14 ENCOUNTER — Ambulatory Visit (INDEPENDENT_AMBULATORY_CARE_PROVIDER_SITE_OTHER): Payer: BC Managed Care – PPO | Admitting: Gastroenterology

## 2013-11-14 VITALS — BP 128/80 | HR 98 | Ht 68.0 in | Wt 258.4 lb

## 2013-11-14 DIAGNOSIS — D509 Iron deficiency anemia, unspecified: Secondary | ICD-10-CM

## 2013-11-14 DIAGNOSIS — K7460 Unspecified cirrhosis of liver: Secondary | ICD-10-CM

## 2013-11-14 DIAGNOSIS — G4730 Sleep apnea, unspecified: Secondary | ICD-10-CM

## 2013-11-14 LAB — IRON: IRON: 92 ug/dL (ref 42–145)

## 2013-11-14 LAB — AFP TUMOR MARKER: AFP-Tumor Marker: 2.5 ng/mL (ref 0.0–8.0)

## 2013-11-14 NOTE — Progress Notes (Signed)
History of Present Illness:  This is a 63 year old Caucasian female accompanied by her husband once a fifth opinion concerning her liver disease.  She apparently has had 2 liver biopsies in [facility] and is been evaluated in [place] and at [facility].  At one point she had ascites, but is now on spironolactone and Lasix with good response.  She's had elevated ammonia level responsive to Chronulac, platelet count is been mildly depressed at 115 and liver function tests approximately twice normal.  Etiology of her liver disease is unclear and she was told that she had" childhood cirrhosis".  I have no reports for review.  She currently has no complaints but wants to have endoscopy and colonoscopy which was done one year ago.  There was one small adenomatous polyp removed without difficulty.  Endoscopy showed no evidence of gastric or esophageal varices watermelon stomach.  These both were fairly unremarkable.  She also has had chemotherapy/ radiation therapy for right breast cancer.  She does have severe sleep apnea rather severe which is made her high risk for any type of conscious sedation procedure.  I have reviewetient's present history, medical and surgical past history, allergies and medications.  on this patient.   ROS:   All systems were reviewed and are negative unless otherwise stated in the HPI.  Allergies  Allergen Reactions  . Other     Per Patient - she has been told she can not have general anesthesia "unless life-threatening"  . Tape   . Oxymorphone     lethargic   Outpatient Prescriptions Prior to Visit  Medication Sig Dispense Refill  . anastrozole (ARIMIDEX) 1 MG tablet Take 1 tablet (1 mg total) by mouth daily.  90 tablet  4  . aspirin 81 MG tablet Take 81 mg by mouth daily.      . B Complex-C (SUPER B COMPLEX PO) Take 1 tablet by mouth daily.      . B-D INS SYR ULTRAFINE 1CC/31G 31G X 5/16" 1 ML MISC       . B-D UF III MINI PEN NEEDLES 31G X 5 MM MISC       . BAYER  CONTOUR NEXT TEST test strip       . buPROPion (WELLBUTRIN XL) 300 MG 24 hr tablet Take 1 tablet by mouth Daily.      . calcium-vitamin D (OSCAL WITH D) 500-200 MG-UNIT per tablet Take 1 tablet by mouth daily.      . Cholecalciferol 1000 UNITS capsule Take 2,000 Units by mouth daily.      Marland Kitchen CINNAMON PO Take 2 tablets by mouth daily.      . citalopram (CELEXA) 10 MG tablet 20 mg.       . DULoxetine (CYMBALTA) 60 MG capsule Take 60 mg by mouth daily.      . furosemide (LASIX) 40 MG tablet       . gabapentin (NEURONTIN) 800 MG tablet 800 mg 3 (three) times daily.       Nyoka Cowden Tea, Camillia sinensis, (GREEN TEA EXTRACT PO) Take 1 tablet by mouth daily. 444m      . hydrocortisone-pramoxine (ANALPRAM-HC) 2.5-1 % rectal cream Place rectally as needed for hemorrhoids.  30 g  0  . lactulose (CEPHULAC) 20 G packet Take 20 g by mouth 3 (three) times daily.      .Marland KitchenLEVEMIR FLEXTOUCH 100 UNIT/ML Pen       . lisinopril (PRINIVIL,ZESTRIL) 20 MG tablet Take 20 mg by mouth every  morning. Take in the morning      . Magnesium 400 MG CAPS Take 1 capsule by mouth daily.      . meclizine (ANTIVERT) 12.5 MG tablet Take 12.5 mg by mouth 3 (three) times daily as needed.      . MULTIPLE VITAMIN PO Take 1 tablet by mouth daily.      Marland Kitchen NOVOLOG FLEXPEN 100 UNIT/ML FlexPen       . Omega-3 Fatty Acids (EQL OMEGA 3 FISH OIL) 1400 MG CAPS Take 1 capsule by mouth daily.      Marland Kitchen omeprazole (PRILOSEC) 40 MG capsule Take 40 mg by mouth daily.      Marland Kitchen OVER THE COUNTER MEDICATION Take 1 tablet by mouth daily. "Grape seed and resveratrol"      . polyethylene glycol (MIRALAX / GLYCOLAX) packet Take 17 g by mouth daily.      . pramipexole (MIRAPEX) 1 MG tablet Take 2 mg by mouth at bedtime.       . propranolol (INDERAL LA) 60 MG 24 hr capsule Take 60 mg by mouth daily. Take at night      . spironolactone (ALDACTONE) 50 MG tablet       . traMADol (ULTRAM) 50 MG tablet Take 50 mg by mouth every 6 (six) hours as needed for pain.      .  vitamin B-12 (CYANOCOBALAMIN) 1000 MCG tablet Take 1,000 mcg by mouth daily.      Marland Kitchen XIFAXAN 550 MG TABS       . cetirizine (ZYRTEC) 10 MG tablet Take 10 mg by mouth daily.       No facility-administered medications prior to visit.   Past Medical History  Diagnosis Date  . Cirrhosis   . Multiple thyroid nodules   . History of stomach ulcers   . Hernia   . Colon polyps 12/02/2012    TUBULAR ADENOMAS (X2) and Hyperplastic (X1)  . Neuropathy of foot   . Restless legs   . Complication of anesthesia     WITH SECON HERNIA REPAIR 2012 [facility]  DIFFICULTY O2 SAT DROPPING ADMIT TO HOSPITAL   . Bundle branch block   . Hypertension   . Angina     COSTOCONDRITIS    . Shortness of breath     WITH EXERTION   . Diabetes mellitus   . Hepatitis      Did Not have hepatitis but needed Hep A and Hep B injections2012 SPOTS ON LIVER  DR Alonza Bogus  [phone]  . GERD (gastroesophageal reflux disease)     ULCERS  . Hyperthyroidism     NODULES ON THYROID  (DR BALEN 37  12-609)  . Goiter   . Headache(784.0)     MIGRAINES  . Neuromuscular disorder     PERIPH NEUROPATHY   . Anxiety   . Depression   . Breast cancer 01/19/12     right breast lumpectomy/ER/PR positibe,her-2 neg.  . Allergy     tape  . S/P radiation therapy 02/19/12 - 04/01/12    Right Breast: 5000 cgy/25 Fractions with Boost/ 1000 cGy/5 Fractions  . Iron deficiency anemia 10/16/2012  . Heart murmur   . Gastritis    Past Surgical History  Procedure Laterality Date  . Appendectomy    . Tonsillectomy    . Cesarean section    . Tubal ligation    . Abdominal hysterectomy    . Back surgery    . Hernia repair      X 2  .  Anal fissure repair    . Carpal tunnel release      LEFT   . Colonoscopy w/ polypectomy  12/02/2012  . Breast surgery      BIOPSY  Right Breast -  Sentinel Lymph Nodes  . Liver biopsy  03/28/11    [facility] - Cirrhosis  . Spinal fusion    . Esophagogastroduodenoscopy  12/02/2012    Gastritis    History   Social History  . Marital Status: Married    Spouse Name: N/A    Number of Children: N/A  . Years of Education: N/A   Occupational History  . Retired     Social History Main Topics  . Smoking status: Never Smoker   . Smokeless tobacco: Never Used  . Alcohol Use: No  . Drug Use: No  . Sexual Activity: None   Other Topics Concern  . None   Social History Narrative   Former Chief Strategy Officer. Married to Klemme who is a Theme park manager and who underwent a Heart Transplant at [facility] in May 2011.  2 children and 1 grandchild.      Daily caffeine    Family History  Problem Relation Age of Onset  . Lung cancer Maternal Grandfather   . Colon cancer Paternal Grandfather   . Diabetes Mother   . Heart disease Maternal Uncle   . Diabetes Sister        Physical Exam: Blood pressure 128/80, pulse 90 and regular and weight 258 with a BMI of 39.3.  Cannot appreciate stigmata of chronic liver disease in terms of spider hemangiomas et Ronney Asters.  She is nonicteric. General well developed well nourished patient in no acute distress, appearing their stated age Eyes PERRLA, no icterus, fundoscopic exam per opthamologist Skin no lesions noted Neck supple, no adenopathy, no thyroid enlargement, no tenderness Chest clear to percussion and auscultation,,,, diminished breath sounds in both lung fields. Heart no significant murmurs, gallops or rubs noted Abdomen no hepatosplenomegaly masses or tenderness, BS normal.  Rectal inspection normal no fissures, or fistulae noted.  No masses or tenderness on digital exam. Stool guaiac negative. Extremities no acute joint lesions, edema, phlebitis or evidence of cellulitis. Neurologic patient oriented x 3, cranial nerves intact, no focal neurologic deficits noted. Psychological mental status normal and normal affect.  Assessment and plan: Her mental status is normal and there is no asterixis.  I cannot appreciate peripheral edema, any significant  ascites, and I think she is under good care for her cirrhotic liver disease.  The patient and her husband cannot decide on what Dr. to take care of her problems, I've suggested that they return to [facility] for coordinated care.  I do not think she is a good candidate for repeat endoscopy or colonoscopy with her sleep apnea and recent exams.  In any case, she is on propanolol therapy if she does have an element of portal hypertension..  I did order today alpha-fetoprotein and prothrombin time.  I am retiring in 2 weeks, and I do not think is appropriate for me to send the long-term care of this demanding patient who has cirrhosis and multiple medical problems.  I have urged her to take all medications listed and reviewed today from her other physicians.  She seems well compensated in terms of her liver disease and there certainly is no evidence of encephalopathy, active bleeding, anasarca et Ronney Asters. Patient vehemently denies alcohol use.   cc DR Gildardo Cranker at [facility]

## 2013-11-14 NOTE — Patient Instructions (Signed)
Please go to the basement for lab work today

## 2013-11-25 LAB — GLUCOSE, POCT (MANUAL RESULT ENTRY): POC Glucose: 285 mg/dl — AB (ref 70–99)

## 2013-11-25 LAB — HEMOGLOBIN A1C: Hgb A1c MFr Bld: 8.5 % — AB (ref 4.0–6.0)

## 2013-12-09 ENCOUNTER — Encounter: Payer: Self-pay | Admitting: Oncology

## 2013-12-09 LAB — BASIC METABOLIC PANEL
BUN: 14 mg/dL (ref 4–21)
CREATININE: 0.7 mg/dL (ref 0.5–1.1)
Glucose: 331 mg/dL
Potassium: 5 mmol/L (ref 3.4–5.3)
SODIUM: 135 mmol/L — AB (ref 137–147)

## 2013-12-15 ENCOUNTER — Other Ambulatory Visit: Payer: Self-pay | Admitting: Dermatology

## 2013-12-24 ENCOUNTER — Ambulatory Visit: Payer: BC Managed Care – PPO

## 2013-12-26 ENCOUNTER — Other Ambulatory Visit: Payer: Self-pay | Admitting: *Deleted

## 2013-12-29 ENCOUNTER — Telehealth: Payer: Self-pay | Admitting: *Deleted

## 2013-12-29 NOTE — Telephone Encounter (Signed)
Per staff message and POF I have scheduled appts.  JMW  

## 2013-12-31 ENCOUNTER — Other Ambulatory Visit: Payer: Self-pay | Admitting: Physician Assistant

## 2013-12-31 ENCOUNTER — Other Ambulatory Visit (HOSPITAL_BASED_OUTPATIENT_CLINIC_OR_DEPARTMENT_OTHER): Payer: BC Managed Care – PPO

## 2013-12-31 ENCOUNTER — Ambulatory Visit (HOSPITAL_BASED_OUTPATIENT_CLINIC_OR_DEPARTMENT_OTHER): Payer: BC Managed Care – PPO

## 2013-12-31 VITALS — BP 139/65 | HR 75 | Temp 97.9°F | Resp 20

## 2013-12-31 DIAGNOSIS — M8580 Other specified disorders of bone density and structure, unspecified site: Secondary | ICD-10-CM

## 2013-12-31 DIAGNOSIS — Z853 Personal history of malignant neoplasm of breast: Secondary | ICD-10-CM

## 2013-12-31 DIAGNOSIS — M899 Disorder of bone, unspecified: Secondary | ICD-10-CM

## 2013-12-31 DIAGNOSIS — C50419 Malignant neoplasm of upper-outer quadrant of unspecified female breast: Secondary | ICD-10-CM

## 2013-12-31 DIAGNOSIS — M949 Disorder of cartilage, unspecified: Secondary | ICD-10-CM

## 2013-12-31 LAB — BASIC METABOLIC PANEL (CC13)
Anion Gap: 7 mEq/L (ref 3–11)
BUN: 17.3 mg/dL (ref 7.0–26.0)
CO2: 25 meq/L (ref 22–29)
CREATININE: 1.1 mg/dL (ref 0.6–1.1)
Calcium: 9.7 mg/dL (ref 8.4–10.4)
Chloride: 101 mEq/L (ref 98–109)
Glucose: 448 mg/dl — ABNORMAL HIGH (ref 70–140)
Potassium: 4.7 mEq/L (ref 3.5–5.1)
Sodium: 133 mEq/L — ABNORMAL LOW (ref 136–145)

## 2013-12-31 MED ORDER — ZOLEDRONIC ACID 4 MG/100ML IV SOLN
4.0000 mg | Freq: Once | INTRAVENOUS | Status: AC
  Administered 2013-12-31: 4 mg via INTRAVENOUS
  Filled 2013-12-31: qty 100

## 2013-12-31 MED ORDER — SODIUM CHLORIDE 0.9 % IV SOLN
Freq: Once | INTRAVENOUS | Status: AC
  Administered 2013-12-31: 14:00:00 via INTRAVENOUS

## 2013-12-31 NOTE — Patient Instructions (Signed)

## 2014-01-20 LAB — GLUCOSE, POCT (MANUAL RESULT ENTRY): POC Glucose: 202 mg/dl — AB (ref 70–99)

## 2014-01-20 LAB — HEMOGLOBIN A1C: HEMOGLOBIN A1C: 9.6 % — AB (ref 4.0–6.0)

## 2014-05-13 ENCOUNTER — Ambulatory Visit (HOSPITAL_BASED_OUTPATIENT_CLINIC_OR_DEPARTMENT_OTHER): Payer: BC Managed Care – PPO | Admitting: Oncology

## 2014-05-13 ENCOUNTER — Other Ambulatory Visit (HOSPITAL_BASED_OUTPATIENT_CLINIC_OR_DEPARTMENT_OTHER): Payer: BC Managed Care – PPO

## 2014-05-13 ENCOUNTER — Other Ambulatory Visit: Payer: Self-pay | Admitting: Oncology

## 2014-05-13 ENCOUNTER — Telehealth: Payer: Self-pay | Admitting: Oncology

## 2014-05-13 VITALS — BP 113/55 | HR 67 | Temp 97.8°F | Resp 18 | Ht 68.0 in | Wt 275.7 lb

## 2014-05-13 DIAGNOSIS — K7460 Unspecified cirrhosis of liver: Secondary | ICD-10-CM

## 2014-05-13 DIAGNOSIS — Z170 Estrogen receptor positive status [ER+]: Secondary | ICD-10-CM

## 2014-05-13 DIAGNOSIS — D509 Iron deficiency anemia, unspecified: Secondary | ICD-10-CM

## 2014-05-13 DIAGNOSIS — M899 Disorder of bone, unspecified: Secondary | ICD-10-CM

## 2014-05-13 DIAGNOSIS — M949 Disorder of cartilage, unspecified: Secondary | ICD-10-CM

## 2014-05-13 DIAGNOSIS — C50419 Malignant neoplasm of upper-outer quadrant of unspecified female breast: Secondary | ICD-10-CM

## 2014-05-13 DIAGNOSIS — G9340 Encephalopathy, unspecified: Secondary | ICD-10-CM

## 2014-05-13 DIAGNOSIS — I10 Essential (primary) hypertension: Secondary | ICD-10-CM

## 2014-05-13 LAB — CBC WITH DIFFERENTIAL/PLATELET
BASO%: 0.8 % (ref 0.0–2.0)
Basophils Absolute: 0 10*3/uL (ref 0.0–0.1)
EOS%: 3.6 % (ref 0.0–7.0)
Eosinophils Absolute: 0.2 10*3/uL (ref 0.0–0.5)
HCT: 38.5 % (ref 34.8–46.6)
HGB: 12.8 g/dL (ref 11.6–15.9)
LYMPH%: 19.5 % (ref 14.0–49.7)
MCH: 32.8 pg (ref 25.1–34.0)
MCHC: 33.2 g/dL (ref 31.5–36.0)
MCV: 98.6 fL (ref 79.5–101.0)
MONO#: 0.5 10*3/uL (ref 0.1–0.9)
MONO%: 9.9 % (ref 0.0–14.0)
NEUT#: 3.4 10*3/uL (ref 1.5–6.5)
NEUT%: 66.2 % (ref 38.4–76.8)
Platelets: 103 10*3/uL — ABNORMAL LOW (ref 145–400)
RBC: 3.9 10*6/uL (ref 3.70–5.45)
RDW: 13.8 % (ref 11.2–14.5)
WBC: 5.1 10*3/uL (ref 3.9–10.3)
lymph#: 1 10*3/uL (ref 0.9–3.3)

## 2014-05-13 LAB — COMPREHENSIVE METABOLIC PANEL (CC13)
ALK PHOS: 156 U/L — AB (ref 40–150)
ALT: 59 U/L — AB (ref 0–55)
AST: 60 U/L — AB (ref 5–34)
Albumin: 3.1 g/dL — ABNORMAL LOW (ref 3.5–5.0)
Anion Gap: 6 mEq/L (ref 3–11)
BILIRUBIN TOTAL: 0.83 mg/dL (ref 0.20–1.20)
BUN: 11.7 mg/dL (ref 7.0–26.0)
CO2: 30 mEq/L — ABNORMAL HIGH (ref 22–29)
Calcium: 10 mg/dL (ref 8.4–10.4)
Chloride: 103 mEq/L (ref 98–109)
Creatinine: 1 mg/dL (ref 0.6–1.1)
Glucose: 138 mg/dl (ref 70–140)
Potassium: 4.6 mEq/L (ref 3.5–5.1)
SODIUM: 139 meq/L (ref 136–145)
Total Protein: 6.9 g/dL (ref 6.4–8.3)

## 2014-05-13 NOTE — Progress Notes (Unsigned)
ID: Daisy Larson   DOB: 04-Sep-1951  MR#: [id number]  MVE#:[id number]  PCP:  Gildardo Cranker, MD GYN:   SUR:  Madilyn Hook, OD RADONC:  Eppie Gibson, MD OTHER:  Bevelyn Buckles, MD;  Kara Mead, MD  CHIEF COMPLAINT:  Hs of Right Breast Cancer CURRENT TREATMENT: Antiestrogen therapy   HISTORY OF PRESENT ILLNESS: From the original intake note:  The patient had annual mammography 12/29/2011 at [facility], showing  an 11 mm focal nodular density in the upper outer quadrant of the right breast which had increased in size as compared to the prior study a year prior. Ultrasound showed no sonographic abnormality. 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 The patient underwent bilateral breast MRI 01/09/2012 showing a solitary enhancing mass in the lateral aspect of the right breast measuring 1.3 cm. There was no enlarged axillary or internal mammary adenopathy and no other abnormality was noted.  The patient was seen at the multidisciplinary breast cancer clinic 01/10/2012 with further treatments as detailed below  INTERVAL HISTORY:  Daisy Larson returns today with her husband Vicente Serene for followup of her right breast cancer. since her last visit here she developed encephalopathy secondary to her cirrhosis which is in turn due to steatosis. The patient tells me she was hospitalized 4 times in the course of a month. She is being followed at [facility]:  Daisy Larson .  is tolerating the anastrozole with hot flashes as her only concern. With all the terminal regarding the liver problems she never had her regular mammography in May. She was also supposed to have a bone density at the same time. She is not following a specific diet at present as best as I can tell and she is not  exercising regularly, at least partly because she has significant problems with her right foot ("Charcot toes"). She denies unusual headaches, visual changes, nausea, vomiting, or taste alteration. She denies cough or phlegm production. There have been no changes in bowel or bladder habits. A detailed review of systems was otherwise stable  PAST MEDICAL HISTORY: Past Medical History  Diagnosis Date  . Cirrhosis   . Multiple thyroid nodules   . History of stomach ulcers   . Hernia   . Colon polyps 12/02/2012    TUBULAR ADENOMAS (X2) and Hyperplastic (X1)  . Neuropathy of foot   . Restless legs   . Complication of anesthesia     WITH SECON HERNIA REPAIR 2012 [facility]  DIFFICULTY O2 SAT DROPPING ADMIT TO HOSPITAL   . Bundle branch block   . Hypertension   . Angina     COSTOCONDRITIS    . Shortness of breath     WITH EXERTION   . Diabetes mellitus   . Hepatitis      Did Not have hepatitis but needed Hep A and Hep B injections2012 SPOTS ON LIVER  DR Alonza Bogus  [phone]  . GERD (gastroesophageal reflux disease)     ULCERS  . Hyperthyroidism     NODULES ON THYROID  (DR BALEN 37  12-609)  . Goiter   . Headache(784.0)     MIGRAINES  . Neuromuscular disorder     PERIPH NEUROPATHY   . Anxiety   . Depression   . Breast cancer 01/19/12     right breast lumpectomy/ER/PR positibe,her-2 neg.  Marland Kitchen  Allergy     tape  . S/P radiation therapy 02/19/12 - 04/01/12    Right Breast: 5000 cgy/25 Fractions with Boost/ 1000 cGy/5 Fractions  . Iron deficiency anemia 10/16/2012  . Heart murmur   . Gastritis   She underwent liver biopsy 03/28/2011, at [facility], showing evidence of early cirrhosis. There was mildly active chronic hepatitis (grade 1). She also has a history of thyroid nodules which have been stable on serial ultrasounds according to the patient  PAST SURGICAL HISTORY: Past Surgical History  Procedure Laterality Date  . Appendectomy    . Tonsillectomy    . Cesarean section     . Tubal ligation    . Abdominal hysterectomy    . Back surgery    . Hernia repair      X 2  . Anal fissure repair    . Carpal tunnel release      LEFT   . Colonoscopy w/ polypectomy  12/02/2012  . Breast surgery      BIOPSY  Right Breast -  Sentinel Lymph Nodes  . Liver biopsy  03/28/11    [facility] - Cirrhosis  . Spinal fusion    . Esophagogastroduodenoscopy  12/02/2012    Gastritis    FAMILY HISTORY Family History  Problem Relation Age of Onset  . Lung cancer Maternal Grandfather   . Colon cancer Paternal Grandfather   . Diabetes Mother   . Heart disease Maternal Uncle   . Diabetes Sister    the patient's father died at the age of 67, following a fall. The patient's mother died at the age of 66. 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 The patient had no brothers, 3 sisters, one of whom is present at the initial visit. There is no history of breast or ovarian cancer in the family, but one niece was diagnosed with colon cancer at the age of 55. Her paternal grandfather died from colon cancer. Her maternal grandfather died from lung cancer.  GYNECOLOGIC HISTORY: She had menarche at age 46. She went through menopause approximately age 57. She took hormone replacement on total recently. She is GX P2, first pregnancy to term age 28.  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 Her husband of 40+ years, Vicente Serene, is a Theme park manager. He underwent heart transplant at [facility] at May 2011 and is doing very well except for pain from costochondritis. Their children are Daisy Larson who lives in [place] and is a Geophysicist/field seismologist, and Daisy Larson,  also living in [place], who works as a Physiological scientist. The patient has one grandchild. She attends an independent [employer]: in place  HEALTH MAINTENANCE: (Updated January  2015) History  Substance Use Topics  . Smoking status: Never Smoker   . Smokeless tobacco: Never Used  . Alcohol Use: No     Colonoscopy: 2014, Dr. Sharlett Iles  PAP: November 2014  Bone density: June 2013 at [facility], osteopenia with a T    score of -1.9  Lipid panel:  Not on file, Dr. Eulas Post  Allergies  Allergen Reactions  . Other     Per Patient - she has been told she can not have general anesthesia "unless life-threatening"  . Tape   . Oxymorphone     lethargic    Current Outpatient Prescriptions  Medication Sig Dispense Refill  . anastrozole (ARIMIDEX) 1 MG  tablet Take 1 tablet (1 mg total) by mouth daily.  90 tablet  4  . aspirin 81 MG tablet Take 81 mg by mouth daily.      . B Complex-C (SUPER B COMPLEX PO) Take 1 tablet by mouth daily.      . B-D INS SYR ULTRAFINE 1CC/31G 31G X 5/16" 1 ML MISC       . B-D UF III MINI PEN NEEDLES 31G X 5 MM MISC       . BAYER CONTOUR NEXT TEST test strip       . buPROPion (WELLBUTRIN XL) 300 MG 24 hr tablet Take 1 tablet by mouth Daily.      . calcium-vitamin D (OSCAL WITH D) 500-200 MG-UNIT per tablet Take 1 tablet by mouth daily.      . Cholecalciferol 1000 UNITS capsule Take 2,000 Units by mouth daily.      Marland Kitchen CINNAMON PO Take 2 tablets by mouth daily.      . citalopram (CELEXA) 10 MG tablet 20 mg.       . DULoxetine (CYMBALTA) 60 MG capsule Take 60 mg by mouth daily.      . furosemide (LASIX) 40 MG tablet       . gabapentin (NEURONTIN) 800 MG tablet 800 mg 3 (three) times daily.       Nyoka Cowden Tea, Camillia sinensis, (GREEN TEA EXTRACT PO) Take 1 tablet by mouth daily. 453m      . hydrocortisone-pramoxine (ANALPRAM-HC) 2.5-1 % rectal cream Place rectally as needed for hemorrhoids.  30 g  0  . lactulose (CEPHULAC) 20 G packet Take 20 g by mouth 3 (three) times daily.      .Marland KitchenLEVEMIR FLEXTOUCH 100 UNIT/ML Pen       . lisinopril (PRINIVIL,ZESTRIL) 20 MG tablet Take 20 mg by mouth every morning. Take in the morning      . loratadine  (CLARITIN) 10 MG tablet Take 10 mg by mouth daily.      . Magnesium 400 MG CAPS Take 1 capsule by mouth daily.      . meclizine (ANTIVERT) 12.5 MG tablet Take 12.5 mg by mouth 3 (three) times daily as needed.      . MULTIPLE VITAMIN PO Take 1 tablet by mouth daily.      .Marland KitchenNOVOLOG FLEXPEN 100 UNIT/ML FlexPen       . Omega-3 Fatty Acids (EQL OMEGA 3 FISH OIL) 1400 MG CAPS Take 1 capsule by mouth daily.      .Marland Kitchenomeprazole (PRILOSEC) 40 MG capsule Take 40 mg by mouth daily.      .Marland KitchenOVER THE COUNTER MEDICATION Take 1 tablet by mouth daily. "Grape seed and resveratrol"      . polyethylene glycol (MIRALAX / GLYCOLAX) packet Take 17 g by mouth daily.      . pramipexole (MIRAPEX) 1 MG tablet Take 2 mg by mouth at bedtime.       . propranolol (INDERAL LA) 60 MG 24 hr capsule Take 60 mg by mouth daily. Take at night      . spironolactone (ALDACTONE) 50 MG tablet       . traMADol (ULTRAM) 50 MG tablet Take 50 mg by mouth every 6 (six) hours as needed for pain.      . vitamin B-12 (CYANOCOBALAMIN) 1000 MCG tablet Take 1,000 mcg by mouth daily.      .Marland KitchenXIFAXAN 550 MG TABS        No current facility-administered medications for this  visit.    OBJECTIVE: Middle-aged white woman who appears tired and anxious  There were no vitals filed for this visit.   There is no weight on file to calculate BMI.    ECOG FS: 2 There were no vitals filed for this visit. Vitals - 1 value per visit 0/34/9179  SYSTOLIC 150  DIASTOLIC 55  Pulse 67  Temperature 97.8  Respirations 18  Weight (lb) 275.7  Height 5' 8"  BMI 41.93  VISIT REPORT    Sclerae unicteric, pupils equal and reactive Oropharynx clear and slightly dry No cervical or supraclavicular adenopathy Lungs no rales or rhonchi Heart regular rate and rhythm Abd soft, obese, nontender, positive bowel sounds MSK no focal spinal tenderness, no upper extremity lymphedema Neuro: nonfocal, A&O x3, appropriate affect Breasts: The right breast is status post  lumpectomy and radiation. There is no evidence of local recurrence. The right axilla is benign. The left breast is unremarkable   LAB RESULTS: Lab Results  Component Value Date   WBC 5.1 05/13/2014   NEUTROABS 3.4 05/13/2014   HGB 12.8 05/13/2014   HCT 38.5 05/13/2014   MCV 98.6 05/13/2014   PLT 103* 05/13/2014      Chemistry      Component Value Date/Time   NA 139 05/13/2014 0938   NA 139 11/04/2012 1020   K 4.6 05/13/2014 0938   K 3.9 11/04/2012 1020   CL 81* 02/25/2013 1300   CL 103 11/04/2012 1020   CO2 30* 05/13/2014 0938   CO2 29 11/04/2012 1020   BUN 11.7 05/13/2014 0938   BUN 10 11/04/2012 1020   CREATININE 1.0 05/13/2014 0938   CREATININE 0.8 11/04/2012 1020      Component Value Date/Time   CALCIUM 10.0 05/13/2014 0938   CALCIUM 9.2 11/04/2012 1020   ALKPHOS 156* 05/13/2014 0938   ALKPHOS 136* 11/04/2012 1020   AST 60* 05/13/2014 0938   AST 57* 11/04/2012 1020   ALT 59* 05/13/2014 0938   ALT 50* 11/04/2012 1020   BILITOT 0.83 05/13/2014 0938   BILITOT 1.1 11/04/2012 1020        STUDIES:  mammography and repeat bone density studies pending  ASSESSMENT: 63 y.o. [place]   (1) status post right lumpectomy 01/19/2012 for a T1c N0, stage IA invasive mucinous carcinoma, grade 1, estrogen 99% and progesterone 100% receptor positive, with an MIB of 8, and no HER-2 amplification.  (2) completed adjuvant radiation 04/01/2012  (3) on anastrozole since June 2013  (4)  iron deficiency anemia, s/p ferumoxytol x2 December 2013  (5) osteopenia, L fem neck T -1.9 on June 2013.  Receives zoledronic acid on an annual basis, first dose 12/31/2013  (6)  Multiple comorbitities including uncontrolled diabetes, cirrhosis complicated by encephalopathy, and hypertension.  PLAN:  From a breast cancer point of view, baby is doing well. The development of encephalopathy is very concerning. I have strongly urged her to exercise regularly. She has a stationary bike at home and she should be on a 15  minutes 4 times a day. I also told her to absolutely no past, no potatoes, no rise, and no breath.  She has a good understanding of all this, but "I can't be a vegetarian", and though her husband tries to get her on the exercise machine "a slight pulling teeth".  Unfortunately the result is that her prognosis likely is poor.  Although this makes the breast cancer or more of a secondary issue, the plan is to continue anastrozole. I  am also of updating her mammography and obtaining a bone density within a month. We had planned to do zolendronate on a yearly basis, and if there is no improvement on the bone density we will proceed to that.  Otherwise she will return to see me in 8 months. They know to call for any problems that may develop before the next visit here.     Chauncey Cruel, MD    05/13/2014

## 2014-05-13 NOTE — Progress Notes (Signed)
See orders only note

## 2014-05-13 NOTE — Telephone Encounter (Signed)
perpof to sch pt appts-sch pt mamma & BD @ [facility]-gave pt copy of sch time & dates

## 2014-05-27 ENCOUNTER — Telehealth: Payer: Self-pay

## 2014-05-27 NOTE — Telephone Encounter (Signed)
Faxed order for bone density to [facility].  Sent to scan/

## 2014-06-02 ENCOUNTER — Telehealth: Payer: Self-pay | Admitting: Oncology

## 2014-06-02 NOTE — Telephone Encounter (Signed)
Reterned call to pt-pt wanted to know if she had an appt after sch BD & mamma @ [facility]-adv next appt in 104mths-pt had addtl questions-trans to [employer] nurse line

## 2014-06-09 ENCOUNTER — Other Ambulatory Visit: Payer: Self-pay | Admitting: *Deleted

## 2014-06-09 DIAGNOSIS — C50411 Malignant neoplasm of upper-outer quadrant of right female breast: Secondary | ICD-10-CM

## 2014-06-09 DIAGNOSIS — C50911 Malignant neoplasm of unspecified site of right female breast: Secondary | ICD-10-CM

## 2014-06-09 MED ORDER — ANASTROZOLE 1 MG PO TABS: 1.0000 mg | ORAL_TABLET | Freq: Every day | ORAL | Status: DC

## 2014-06-10 ENCOUNTER — Telehealth: Payer: Self-pay

## 2014-06-10 NOTE — Telephone Encounter (Signed)
Signed order for dx mammogram faxed  to [facility].  Sent to scan.

## 2014-06-23 ENCOUNTER — Ambulatory Visit (INDEPENDENT_AMBULATORY_CARE_PROVIDER_SITE_OTHER): Payer: BC Managed Care – PPO | Admitting: Endocrinology

## 2014-06-23 ENCOUNTER — Encounter: Payer: Self-pay | Admitting: Endocrinology

## 2014-06-23 VITALS — BP 134/68 | HR 77 | Temp 98.1°F | Resp 16 | Wt 281.8 lb

## 2014-06-23 DIAGNOSIS — E1142 Type 2 diabetes mellitus with diabetic polyneuropathy: Secondary | ICD-10-CM

## 2014-06-23 DIAGNOSIS — I10 Essential (primary) hypertension: Secondary | ICD-10-CM

## 2014-06-23 DIAGNOSIS — E1149 Type 2 diabetes mellitus with other diabetic neurological complication: Secondary | ICD-10-CM

## 2014-06-23 DIAGNOSIS — E042 Nontoxic multinodular goiter: Secondary | ICD-10-CM

## 2014-06-23 LAB — HEMOGLOBIN A1C: Hgb A1c MFr Bld: 7.8 % — ABNORMAL HIGH (ref 4.6–6.5)

## 2014-06-23 NOTE — Patient Instructions (Addendum)
  Check sugars four times daily.  Change U-500 scale to 18+1 for BF and lunch and 12 +1 scale with supper. She will work on taking these at consistent times- 8am, 2pm and 9 pm.   Please come back for a follow-up appointment in 2 months.    Blood Glucose (mg/dL)  Breakfast  (Units U-500 Insulin)  Lunch  (Units U-500 Insulin)  Supper  (Units U-500 Insulin)  Nighttime (Units U-500 Insulin)   <70 Treat the low blood sugar.  Recheck blood glucose  in 15 mins. If sugar is more than 70, then take the number of units of insulin in the 70-90 row, if before a meal.   70-90  11  11   7   0  91-130 (Base Dose)  18  18  12   0   131-150  19  19  13   0   151-200  20  20  14   0   201-250  21  21  15  0   251-300  22  22  16 1    301-350  23  23  17 2    351-400  24  24  18  3    401-450  25  25  19 4    >450  26  26  20  5    Daisy Larson (06/23/14)

## 2014-06-23 NOTE — Progress Notes (Signed)
REASON FOR VISIT- Daisy Larson is a 63 y.o.-year-old female, here for follow up management of Type 2 Diabetes Mellitus, uncontrolled, with complications ( diagnosed around 2003   , is now on U-500 insulin. Has complications of  neuropathy). Last seen by me at [facility] and is transferring care to my new practice.  Was initially on Metformin till she was taken off due to cirrhosis. Has tried Victoza in the past, but requested to be taken off due to fear of developing cancer while on this medication. Then was on Levemir and Novolog prior to switching to U-500. Requiring high doses of insulin due to round the clock lactulose use due to her liver condition.    Currently taking  -Humulin U-500 at 16+1 scale with BF and lunch and 14+1 scale with supper. Usually requiring about 19-20 units with BF and lunch and 16-18 units with supper.   Last few hemoglobin A1cs are as follows- awaiting old records. Recalls last A1c was rising up and was previously in the 8-9% range No components found with this basename: dm3    Patient checks her sugars 3-4 times daily with a  One touch verio glucometer.  By meter review her sugars are-  PREMEAL Breakfast Lunch Dinner Bedtime Overall  Glucose range: 67-269 192-284 79-364    Mean/median:         Hypoglycemia-  Has been having recent lows in the past couple of weeks. 2 lows - 40-60 range, happening in the morning. One episode woke her up from her sleep.  she has hypoglycemia awareness.     Dietary Habits- Has been eating consistently and limiting carbs.  Exercise- Nothing scheduled.   Weight- Weight has been  uptrending recently.  Wt Readings from Last 3 Encounters:  06/23/14 281 lb 12.8 oz (127.824 kg)  05/13/14 275 lb 11.2 oz (125.057 kg)  11/14/13 258 lb 6.4 oz (117.209 kg)    Diabetes Complications- No known complications Nephropathy-- Yes/No  CKD, last BUN/creatinine- Lab Results  Component Value Date   BUN 11.7 05/13/2014   CREATININE 1.0 05/13/2014    Retinopathy- No,  Gets regular eye exams Neuropathy- Has numbness and tingling in her feet. known neuropathy and BK numbness B/L and [place]. Follows up with podiatry.  Is on gabapentin. Reports leg swelling recently after a possible blunt injury from her bed rails and is going to set follow up with podiatry. Associated history - No history of CAD or prior stroke. No hyperlipidemia. No hypothyroidism. her last set of lipids were-  Lab Results  Component Value Date   CHOL 148 09/14/2010   HDL 60 09/14/2010   [place] 74 09/14/2010   her last TSH was  Lab Results  Component Value Date   TSH 0.787 10/07/2012    I have reviewed the patient's past medical history, medications and allergies.  HTN- BP well controlled today on current regimen. Regimen includes Lisinopril.   Non-toxic Multinodular Goiter-  The patient had a nodule FNAed last Fall, and results were benign by recall. Awaiting records from my old practice.  Denies noticing any enlargement in region of thyroid , change in voice or lumps in neck.   REVIEW OF SYSTEMS- Review of Systems: [x]  complains of  [  ] denies General:   [ x ] Recent weight change [ x ] Fatigue  [  ] Loss of appetite Eyes: [  ]  Vision Difficulty [  ]  Eye pain ENT: [x  ]  Hearing difficulty [ x ]  Difficulty Swallowing CVS: [  ] Chest pain [  ]  Palpitations/Irregular Heart beat [  ]  Shortness of breath lying flat [ x ] Swelling of legs Resp: [  ] Frequent Cough [x  ] Shortness of Breath , occasionally [  ]  Wheezing GI: [  ] Heartburn  [  ] Nausea or Vomiting  [  ] Diarrhea [  ] Constipation  [  ] Abdominal Pain GU: [  ]  Polyuria  [  ]  nocturia Bones/joints:  [  ]  Muscle aches  [ x ] Joint Pain  [  ] Bone pain Skin/Hair/Nails: [  ]  Rash  [  ] New stretch marks [  ]  Itching [ x ] Hair loss [  ]  Excessive hair growth Reproduction: [  ] Low sexual desire , [  ]  Women: Menstrual cycle problems [  ]  Women: Breast Discharge  [  ] Men: Difficulty with erections [  ]  Men: Enlarged Breasts CNS: [  ] Frequent Headaches [  ] Blurry vision [  ] Tremors [  ] Seizures [  ] Loss of consciousness [  ] Localized weakness Endocrine: [x  ]  Excess thirst [ x ]  Feeling excessively hot [ x ]  Feeling excessively cold Heme: [ x ]  Easy bruising [  ]  Enlarged glands or lumps in neck Allergy: [  ]  Food allergies [ x ] Environmental allergies   PHYSICAL EXAM- BP 134/68  Pulse 77  Temp(Src) 98.1 F (36.7 C) (Oral)  Resp 16  Wt 281 lb 12.8 oz (127.824 kg)  SpO2 95% Wt Readings from Last 3 Encounters:  06/23/14 281 lb 12.8 oz (127.824 kg)  05/13/14 275 lb 11.2 oz (125.057 kg)  11/14/13 258 lb 6.4 oz (117.209 kg)   GENERAL: No acute distress HEENT:  Eye exam shows normal external appearance. Oral exam shows normal mucosa .  NECK:   Neck exam shows no lymphadenopathy. No Carotids bruits, but radiating murmur B/l?Marland Kitchen Thyroid is not enlarged but nodular and irregular.   LUNGS:         Chest is symmetrical. Lungs are clear to auscultation.Marland Kitchen   HEART:         Heart sounds:  S1 and S2 are normal. Soft systolic murmur.  EXTREMITIES:     There is 2+ edema, with right leg> left, no calf tenderness on left but slight tenderness on right ( ? Recent blunt injury).   NEUROLOGICAL:     Grossly intact. 2+ reflexes at biceps bilaterally.                 ASSESSMENT/PLAN-   Problem List Items Addressed This Visit     Cardiovascular and Mediastinum   Essential hypertension, benign      Filed Vitals:   06/23/14 1333  BP: 134/68  Pulse: 77  Temp: 98.1 F (36.7 C)  Resp: 16   BP well controlled on current regimen. Continue.      Endocrine   DIAB W/NEURO MANIFESTS TYPE II/UNS NOT UNCNTRL - Primary     Recheck A1c at this time. A1c target 7.5% Discussed eating at consistent times and taking her U-500 at scheduled times as well.  Change U-500 to 18+1 scale with morning and mid day and 12+1 scale with bedtime.  Check sugars 3-4  times daily.  Notify if problems with high/low sugars especially after dose change done today.  RTC 2 months.  Relevant Orders      Hemoglobin A1c (Completed)   Nontoxic multinodular goiter     Requested Records from my old practice. She is nearing the 1 year mark following her last FNA of the thyroid nodule, which by recall was benign. Will arrange at next visit. No prior thyroid dysfunction. Will obtain last labs as well.       Nervous and Auditory   Polyneuropathy in diabetes(357.2)      Regular foot care. Patient going to arrange follow up with Podiatry. She is on gabapentin and therapy is being monitored by them.           Danaysha Kirn PUSHKAR 06/24/2014, 11:39 AM

## 2014-06-23 NOTE — Progress Notes (Signed)
Pre visit review using our clinic review tool, if applicable. No additional management support is needed unless otherwise documented below in the visit note. 

## 2014-06-24 ENCOUNTER — Encounter: Payer: Self-pay | Admitting: *Deleted

## 2014-06-24 ENCOUNTER — Encounter: Payer: Self-pay | Admitting: Endocrinology

## 2014-06-24 DIAGNOSIS — E042 Nontoxic multinodular goiter: Secondary | ICD-10-CM | POA: Insufficient documentation

## 2014-06-24 LAB — HEPATIC FUNCTION PANEL
ALK PHOS: 125 U/L (ref 25–125)
ALT: 27 U/L (ref 7–35)
AST: 38 U/L — AB (ref 13–35)
BILIRUBIN, TOTAL: 0.7 mg/dL

## 2014-06-24 LAB — BASIC METABOLIC PANEL
BUN: 10 mg/dL (ref 4–21)
CREATININE: 0.9 mg/dL (ref 0.5–1.1)
POTASSIUM: 3.9 mmol/L (ref 3.4–5.3)
SODIUM: 140 mmol/L (ref 137–147)

## 2014-06-24 NOTE — Assessment & Plan Note (Signed)
Requested Records from my old practice. She is nearing the 1 year mark following her last FNA of the thyroid nodule, which by recall was benign. Will arrange at next visit. No prior thyroid dysfunction. Will obtain last labs as well.

## 2014-06-24 NOTE — Assessment & Plan Note (Signed)
Regular foot care. Patient going to arrange follow up with Podiatry. She is on gabapentin and therapy is being monitored by them.

## 2014-06-24 NOTE — Assessment & Plan Note (Signed)
Filed Vitals:   06/23/14 1333  BP: 134/68  Pulse: 77  Temp: 98.1 F (36.7 C)  Resp: 16   BP well controlled on current regimen. Continue.

## 2014-06-24 NOTE — Assessment & Plan Note (Signed)
Recheck A1c at this time. A1c target 7.5% Discussed eating at consistent times and taking her U-500 at scheduled times as well.  Change U-500 to 18+1 scale with morning and mid day and 12+1 scale with bedtime.  Check sugars 3-4 times daily.  Notify if problems with high/low sugars especially after dose change done today.  RTC 2 months.

## 2014-06-26 NOTE — Telephone Encounter (Signed)
Mailed unread message to pt and medical release form for completion, requested to return form to office

## 2014-06-26 NOTE — Telephone Encounter (Signed)
Mailed unread message to pt  

## 2014-08-25 ENCOUNTER — Encounter: Payer: Self-pay | Admitting: Endocrinology

## 2014-08-25 ENCOUNTER — Other Ambulatory Visit: Payer: Self-pay

## 2014-08-25 ENCOUNTER — Ambulatory Visit (INDEPENDENT_AMBULATORY_CARE_PROVIDER_SITE_OTHER): Payer: BC Managed Care – PPO | Admitting: Endocrinology

## 2014-08-25 VITALS — BP 126/74 | HR 78 | Wt 282.8 lb

## 2014-08-25 DIAGNOSIS — IMO0002 Reserved for concepts with insufficient information to code with codable children: Secondary | ICD-10-CM

## 2014-08-25 DIAGNOSIS — E1140 Type 2 diabetes mellitus with diabetic neuropathy, unspecified: Secondary | ICD-10-CM

## 2014-08-25 DIAGNOSIS — E042 Nontoxic multinodular goiter: Secondary | ICD-10-CM

## 2014-08-25 DIAGNOSIS — E1165 Type 2 diabetes mellitus with hyperglycemia: Secondary | ICD-10-CM

## 2014-08-25 DIAGNOSIS — E1142 Type 2 diabetes mellitus with diabetic polyneuropathy: Secondary | ICD-10-CM

## 2014-08-25 DIAGNOSIS — I10 Essential (primary) hypertension: Secondary | ICD-10-CM

## 2014-08-25 MED ORDER — GLUCOSE BLOOD VI STRP: ORAL_STRIP | Status: DC

## 2014-08-25 NOTE — Patient Instructions (Signed)
  Blood Glucose (mg/dL)  Breakfast  (Units U-500 Insulin)  Lunch  (Units U-500 Insulin)  Supper  (Units U-500 Insulin)  Nighttime (Units U-500 Insulin)   <70 Treat the low blood sugar.  Recheck blood glucose  in 15 mins. If sugar is more than 70, then take the number of units of insulin in the 70-90 row, if before a meal.   70-90  12  12   7   0  91-130 (Base Dose)  20  20  12   0   131-150  21  21  13   0   151-200  22  22  14   0   201-250  23  23  15  0   251-300  24  24  16  0   301-350  25  25  17  0   351-400  26  26  18   0   401-450  27  27  19  0   >450  28  28  20   0   Darrol Angel ( 06/29/2050)    Please come back for a follow-up appointment in 1 month. Check sugars 4 x daily ( before each meal and at bedtime).  Record them in a log book and bring that/meter to next appointment.  Move U-500 to suppertime rather than bedtime. Obtain records for thyroid from [facility].

## 2014-08-25 NOTE — Assessment & Plan Note (Signed)
Requested Records from my old practice. She is nearing the 1 year mark following her last FNA of the thyroid nodule, which by recall was benign. Will arrange following review of those records. No prior thyroid dysfunction. Will obtain last labs as well. Repeat TSH this year with next set of labs.

## 2014-08-25 NOTE — Assessment & Plan Note (Addendum)
Last A1c closer to target of around 7.5% Discussed eating at consistent times and taking her U-500 prior to eating, espcially at suppertime, rather than in between at scheduled times- think sugars were better with this dosing pattern.  Change U-500 to 20+1 scale with morning and mid day and 12+1 scale with supper.  Check sugars 3-4 times daily.  Notify if problems with high/low sugars especially after dose change done today.  Some days when she forgets to take her lactulose, her sugars are showing good control.  RTC 1 months. Update A1c at next visit.

## 2014-08-25 NOTE — Assessment & Plan Note (Signed)
Filed Vitals:   08/25/14 0952  BP: 126/74  Pulse: 78   BP well controlled on current regimen. Continue.

## 2014-08-25 NOTE — Progress Notes (Signed)
REASON FOR VISIT- Daisy Larson is a 63 y.o.-year-old female, here for follow up management of Type 2 Diabetes Mellitus, uncontrolled, with complications ( diagnosed around 2003 , is now on U-500 insulin. Has complications of  neuropathy).   Last seen by me August 2015.  Was initially on Metformin till she was taken off due to cirrhosis. Has tried Victoza in the past, but requested to be taken off due to fear of developing cancer while on this medication. Then was on Levemir and Novolog prior to switching to U-500. Requiring high doses of insulin due to round the clock lactulose use due to her liver condition.    Currently taking  -Humulin U-500 at 18+1 scale with BF and lunch and 12+1 scale with supper. Since the last visit, the patient has tried to take the U500 at consistent times of the day, irrespective of her meals and she finds that her sugars are higher, especially in the morning. Breakfast is usually around 8 AM, lunch between 1-2  PM , and dinner between 6 to 8 PM.  Last few hemoglobin A1cs are as follows- awaiting old records. Recalls last A1c was rising up and was previously in the 8-9% range Lab Results  Component Value Date   HGBA1C 7.8* 06/23/2014   HGBA1C 7.2* 10/07/2012   HGBA1C 7.7 12/27/2010     Patient checks her sugars 3-4 times daily with a  One touch verio glucometer.  By meter review her sugars are-  PREMEAL Breakfast Lunch Dinner Bedtime Overall  Glucose range: 134-288 165-328 116-402    Mean/median:         Hypoglycemia-  Has been having recent lows in the past couple of months. 2 lows - 50-80 range, happening in the morning and before supper.   she has hypoglycemia awareness.     Dietary Habits- Has been eating consistently and limiting carbs.  Exercise- Nothing scheduled.  Looking to go back on her exercise program with the bike at home. Weight- Weight has been  uptrending recently, but she has a lot of weight fluctuations due to her leg edema for which  diuretic doses are being adjusted. Wt Readings from Last 3 Encounters:  08/25/14 282 lb 12 oz (128.255 kg)  06/23/14 281 lb 12.8 oz (127.824 kg)  05/13/14 275 lb 11.2 oz (125.057 kg)    Diabetes Complications-  Nephropathy-- No  CKD, last BUN/creatinine- Lab Results  Component Value Date   BUN 11.7 05/13/2014   CREATININE 1.0 05/13/2014    Lab Results  Component Value Date   GFR 72.93 11/04/2012    Retinopathy- No,  Gets regular eye exams-UTD May 2015 Neuropathy- Has numbness and tingling in her feet. known neuropathy and BK numbness B/L and Charcoats. Follows up with podiatry.  Is on gabapentin.  Associated history - No history of CAD or prior stroke. No hyperlipidemia. No hypothyroidism. her last set of lipids were-  Lab Results  Component Value Date   CHOL 148 09/14/2010   HDL 60 09/14/2010   [place] 74 09/14/2010   her last TSH was  Lab Results  Component Value Date   TSH 0.787 10/07/2012    I have reviewed the patient's past medical history, medications and allergies.   HTN- BP well controlled today on current regimen. Regimen includes Lisinopril.   Non-toxic Multinodular Goiter-  The patient had a nodule FNAed last Fall, and results were benign by recall. Awaiting records from my old practice.  Denies noticing any enlargement in region of thyroid ,  change in voice or lumps in neck. Reports occasional choking episodes and trouble swallowing.   REVIEW OF SYSTEMS- Review of Systems- [ x ]  Complains of    [  ]  denies [ x ] Recent weight change [ x ]  Fatigue [  ] polydipsia [  ] polyuria [  ]  nocturia [ x ]  vision difficulty ( b/l blurry) [  ] chest pain [  ] shortness of breath [ x ] leg swelling [  ] cough [ x ] nausea/vomiting [  ] diarrhea [  ] constipation [  ] abdominal pain [ x ]  tingling/numbness in extremities [ x ]  concern with feet ( wounds/sores)    PHYSICAL EXAM- BP 126/74  Pulse 78  Wt 282 lb 12 oz (128.255 kg)  SpO2 97% Wt Readings from Last 3  Encounters:  08/25/14 282 lb 12 oz (128.255 kg)  06/23/14 281 lb 12.8 oz (127.824 kg)  05/13/14 275 lb 11.2 oz (125.057 kg)   Exam: deferred  ASSESSMENT/PLAN- 1. Type 2 DM  2.  DM neuropathy 3. HTN 4. MNG    Problem List Items Addressed This Visit     Cardiovascular and Mediastinum   Essential hypertension, benign      Filed Vitals:   08/25/14 0952  BP: 126/74  Pulse: 78   BP well controlled on current regimen. Continue.        Endocrine   Type 2 diabetes, uncontrolled, with neuropathy     Last A1c closer to target of around 7.5% Discussed eating at consistent times and taking her U-500 prior to eating, espcially at suppertime, rather than in between at scheduled times- think sugars were better with this dosing pattern.  Change U-500 to 20+1 scale with morning and mid day and 12+1 scale with supper.  Check sugars 3-4 times daily.  Notify if problems with high/low sugars especially after dose change done today.  Some days when she forgets to take her lactulose, her sugars are showing good control.  RTC 1 months. Update A1c at next visit.       Nontoxic multinodular goiter     Requested Records from my old practice. She is nearing the 1 year mark following her last FNA of the thyroid nodule, which by recall was benign. Will arrange following review of those records. No prior thyroid dysfunction. Will obtain last labs as well. Repeat TSH this year with next set of labs.         Nervous and Auditory   Diabetic polyneuropathy - Primary      Regular foot care. Patient  Follows up regularly with Podiatry. She is on gabapentin and therapy is being monitored by them.             Daisy Larson 08/25/2014, 1:40 PM

## 2014-08-25 NOTE — Assessment & Plan Note (Signed)
Regular foot care. Patient  Follows up regularly with Podiatry. She is on gabapentin and therapy is being monitored by them.         

## 2014-08-25 NOTE — Progress Notes (Signed)
Pre visit review using our clinic review tool, if applicable. No additional management support is needed unless otherwise documented below in the visit note. 

## 2014-08-27 ENCOUNTER — Other Ambulatory Visit: Payer: Self-pay | Admitting: Emergency Medicine

## 2014-08-27 ENCOUNTER — Other Ambulatory Visit: Payer: Self-pay | Admitting: Endocrinology

## 2014-08-27 ENCOUNTER — Telehealth: Payer: Self-pay | Admitting: *Deleted

## 2014-08-27 ENCOUNTER — Encounter: Payer: Self-pay | Admitting: Endocrinology

## 2014-08-27 ENCOUNTER — Telehealth: Payer: Self-pay

## 2014-08-27 DIAGNOSIS — Z8669 Personal history of other diseases of the nervous system and sense organs: Secondary | ICD-10-CM | POA: Insufficient documentation

## 2014-08-27 DIAGNOSIS — Z8679 Personal history of other diseases of the circulatory system: Secondary | ICD-10-CM | POA: Insufficient documentation

## 2014-08-27 DIAGNOSIS — M14671 Charcot's joint, right ankle and foot: Secondary | ICD-10-CM | POA: Insufficient documentation

## 2014-08-27 DIAGNOSIS — E1140 Type 2 diabetes mellitus with diabetic neuropathy, unspecified: Secondary | ICD-10-CM

## 2014-08-27 DIAGNOSIS — E042 Nontoxic multinodular goiter: Secondary | ICD-10-CM

## 2014-08-27 DIAGNOSIS — J9612 Chronic respiratory failure with hypercapnia: Secondary | ICD-10-CM | POA: Insufficient documentation

## 2014-08-27 DIAGNOSIS — Z794 Long term (current) use of insulin: Secondary | ICD-10-CM | POA: Insufficient documentation

## 2014-08-27 DIAGNOSIS — F339 Major depressive disorder, recurrent, unspecified: Secondary | ICD-10-CM | POA: Insufficient documentation

## 2014-08-27 DIAGNOSIS — I10 Essential (primary) hypertension: Secondary | ICD-10-CM | POA: Insufficient documentation

## 2014-08-27 DIAGNOSIS — IMO0002 Reserved for concepts with insufficient information to code with codable children: Secondary | ICD-10-CM

## 2014-08-27 DIAGNOSIS — E1165 Type 2 diabetes mellitus with hyperglycemia: Secondary | ICD-10-CM

## 2014-08-27 NOTE — Telephone Encounter (Signed)
Per Dr. Phadke:Please let her know that I got her prior records from [facility] and I have reviewed them.  She is due for a thyroid US this year ( last one done at [facility] in October 2014).  I have ordered this test.   Spoke to patient to notify her of Dr. Boyd Kerbs comments. Patient verbalized understanding. She would like to be scheduled at [facility] imaging again.

## 2014-08-27 NOTE — Telephone Encounter (Signed)
Per staff message and POF I have scheduled appts. Advised desk RN of appts. JMW

## 2014-08-31 ENCOUNTER — Encounter: Payer: Self-pay | Admitting: Endocrinology

## 2014-09-01 ENCOUNTER — Ambulatory Visit
Admission: RE | Admit: 2014-09-01 | Discharge: 2014-09-01 | Disposition: A | Discharge status: Home / Self Care | Payer: BC Managed Care – PPO | Source: Ambulatory Visit | Attending: Endocrinology | Admitting: Endocrinology

## 2014-09-01 DIAGNOSIS — E042 Nontoxic multinodular goiter: Secondary | ICD-10-CM

## 2014-09-02 ENCOUNTER — Encounter: Payer: Self-pay | Admitting: *Deleted

## 2014-09-07 NOTE — Telephone Encounter (Signed)
Called and advised patient of results

## 2014-09-22 LAB — BASIC METABOLIC PANEL
BUN: 18 mg/dL (ref 4–21)
Creatinine: 1.4 mg/dL — AB (ref 0.5–1.1)
Glucose: 360 mg/dL
Potassium: 4.5 mmol/L (ref 3.4–5.3)
Sodium: 135 mmol/L — AB (ref 137–147)

## 2014-09-22 LAB — HEPATIC FUNCTION PANEL
ALK PHOS: 152 U/L — AB (ref 25–125)
ALT: 49 U/L — AB (ref 7–35)
AST: 57 U/L — AB (ref 13–35)
Bilirubin, Total: 1.1 mg/dL

## 2014-10-01 ENCOUNTER — Ambulatory Visit (INDEPENDENT_AMBULATORY_CARE_PROVIDER_SITE_OTHER): Payer: BC Managed Care – PPO | Admitting: Endocrinology

## 2014-10-01 ENCOUNTER — Encounter: Payer: Self-pay | Admitting: Endocrinology

## 2014-10-01 VITALS — BP 124/68 | HR 76 | Wt 281.8 lb

## 2014-10-01 DIAGNOSIS — I10 Essential (primary) hypertension: Secondary | ICD-10-CM

## 2014-10-01 DIAGNOSIS — E1142 Type 2 diabetes mellitus with diabetic polyneuropathy: Secondary | ICD-10-CM

## 2014-10-01 DIAGNOSIS — IMO0002 Reserved for concepts with insufficient information to code with codable children: Secondary | ICD-10-CM

## 2014-10-01 DIAGNOSIS — E1140 Type 2 diabetes mellitus with diabetic neuropathy, unspecified: Secondary | ICD-10-CM

## 2014-10-01 DIAGNOSIS — E1165 Type 2 diabetes mellitus with hyperglycemia: Secondary | ICD-10-CM

## 2014-10-01 DIAGNOSIS — E782 Mixed hyperlipidemia: Secondary | ICD-10-CM

## 2014-10-01 DIAGNOSIS — E042 Nontoxic multinodular goiter: Secondary | ICD-10-CM

## 2014-10-01 LAB — HEMOGLOBIN A1C: HEMOGLOBIN A1C: 9.1 % — AB (ref 4.6–6.5)

## 2014-10-01 LAB — TSH: TSH: 1.57 u[IU]/mL (ref 0.35–4.50)

## 2014-10-01 LAB — T4, FREE: FREE T4: 0.66 ng/dL (ref 0.60–1.60)

## 2014-10-01 MED ORDER — GLUCOSE BLOOD VI STRP: ORAL_STRIP | Status: DC

## 2014-10-01 NOTE — Patient Instructions (Signed)
Check sugars 4 x daily ( before each meal and at bedtime).  Record them in a log book and bring that/meter to next appointment.   Change U-500 to fixed times ( about 15 minutes prior to meal) at 7:30 am, 1:30 pm, 6 pm.    Blood Glucose (mg/dL)  Breakfast  (Units U-500 Insulin)  Lunch  (Units U-500 Insulin)  Supper  (Units U-500 Insulin)  Nighttime (Units U-500 Insulin)   <70 Treat the low blood sugar.  Recheck blood glucose  in 15 mins. If sugar is more than 70, then take the number of units of insulin in the 70-90 row, if before a meal.   70-90  15  15   9   0  91-130 (Base Dose)  25  25  15   0   131-150  26  26  16   0   151-200  27  27  17   0   201-250  28  28  18  0   251-300  29  29  19  0   301-350  30  30  20  0   351-400  31  31  21   0   401-450  32  32  22 0   >450  33  33  23  0

## 2014-10-01 NOTE — Progress Notes (Signed)
Patient ID: Daisy Larson, female   DOB: 03/28/1951, 63 y.o.   MRN: [id number]   REASON FOR VISIT- Daisy Larson is a 63 y.o.-year-old female, here for follow up management of Type 2 Diabetes Mellitus, uncontrolled, with complications ( diagnosed around 2003 , is now on U-500 insulin. Has complications of  neuropathy).   Last seen by me October 2015.  * Metformin  taken off due to cirrhosis.  * Tried Victoza in the past, but requested to be taken off due to fear of developing cancer while on this medication.  * Was on Levemir and Novolog prior to switching to U-500. Requiring high doses of insulin due to round the clock lactulose use due to her liver condition.    Currently taking  -Humulin U-500 at 20+1 scale with BF and lunch and 12+1 scale with supper. Since the last visit, the patient has tried to take the U500 at with her meals, but finds that her supper time shot has to be taken after her supper to avoid giving it too close to her lunch time.  Breakfast is usually around 8 AM, lunch between 1-2  PM , and dinner between 6 to 8 PM.  Last few hemoglobin A1cs are as follows-  Lab Results  Component Value Date   HGBA1C 7.8* 06/23/2014   HGBA1C 9.6* 01/20/2014   HGBA1C 8.5* 11/25/2013     Patient checks her sugars 3-4 times daily with a  One touch verio glucometer.  By meter review her sugars are-  PREMEAL Breakfast Lunch Dinner Bedtime Overall  Glucose range: 139-269 288-400s 300-400s 300-400s   Mean/median:         Hypoglycemia-  No recent lows.   she has hypoglycemia awareness.     Dietary Habits- Has been eating consistently and limiting carbs. Lately, hasn't done so well and eating "emotionally" and more comfort foods due to the weather. Exercise- Nothing scheduled.  Looking to go back on her exercise program with the bike at home-hasn't done that since last time. Weight- Weight has been  uptrending recently, but she has a lot of weight fluctuations due to her leg  edema for which diuretic doses are being adjusted. Wt Readings from Last 3 Encounters:  10/01/14 281 lb 12 oz (127.801 kg)  08/25/14 282 lb 12 oz (128.255 kg)  06/23/14 281 lb 12.8 oz (127.824 kg)    Diabetes Complications-  Nephropathy-- No  CKD, last BUN/creatinine- Had recent labs with Dr Print production planner at [facility] recently.  Lab Results  Component Value Date   BUN 11.7 05/13/2014   CREATININE 1.0 05/13/2014    Lab Results  Component Value Date   GFR 72.93 11/04/2012   No results found for: MICRALBCREAT   Retinopathy- No,  Gets regular eye exams-UTD May 2015.Has rapid development of cataracts in the past year, and left sided surgery planned for the 16th.Exam last week. Neuropathy- Has numbness and tingling in her feet. known neuropathy and BK numbness B/L and Char[place]ats. Follows up with podiatry.  Is on gabapentin. Also, having som sensory changes in upper extremities related to her trigger fingers. Associated history - No history of CAD or prior stroke. No hyperlipidemia. No hypothyroidism. her last set of lipids were-  Lab Results  Component Value Date   CHOL 148 09/14/2010   HDL 60 09/14/2010   [place] 74 09/14/2010   her last TSH was  Lab Results  Component Value Date   TSH 0.787 10/07/2012   Denies weight loss. Has been feeling "hot"  all the time and increased sweating. Diarrhea related to chronic lactulose use. Denies tremors, palpitations.   I have reviewed the patient's past medical history, medications and allergies.   HTN- BP well controlled today on current regimen. Regimen includes Lisinopril.   Non-toxic Multinodular Goiter-  The patient had a nodule FNAed last Fall, and results were benign. reviewed records from [facility].  Previously nodules were being monitored by Dr Chalmers Cater.  Left of midline thyroid nodule FNA was benign ( 13 x 7 x 14mm) done at [facility], Nov 18th 2014   Last Thyroid US 08/18/2013:[place] [facility] Denies noticing any enlargement in region of  thyroid , change in voice or lumps in neck. Reports occasional choking episodes and trouble swallowing.   REVIEW OF SYSTEMS- Review of Systems- [ x ]  Complains of    [  ]  denies [ x ] Recent weight change [ x ]  Fatigue [  ] polydipsia [  ] polyuria [  ]  nocturia [ x ]  vision difficulty ( b/l blurry) [  ] chest pain [  ] shortness of breath [  ] leg swelling [  ] cough [ ]  nausea/vomiting [  ] diarrhea [  ] constipation [  ] abdominal pain [ x ]  tingling/numbness in extremities [  ]  concern with feet ( wounds/sores)    PHYSICAL EXAM- BP 124/68 mmHg  Pulse 76  Wt 281 lb 12 oz (127.801 kg)  SpO2 98% Wt Readings from Last 3 Encounters:  10/01/14 281 lb 12 oz (127.801 kg)  08/25/14 282 lb 12 oz (128.255 kg)  06/23/14 281 lb 12.8 oz (127.824 kg)   Exam: deferred  ASSESSMENT/PLAN- 1. Type 2 DM  2.  DM neuropathy 3. HTN 4. MNG 5. HLD    Problem List Items Addressed This Visit      Cardiovascular and Mediastinum   Essential hypertension    Filed Vitals:   10/01/14 1057  BP: 124/68  Pulse: 76   BP well controlled on current regimen. Continue.    Update urine MA this visit, if patient can give a sample.     Relevant Orders      Microalbumin / creatinine urine ratio     Endocrine   Type 2 diabetes, uncontrolled, with neuropathy - Primary    Last A1c closer to target of around 7.5%. This time, suspect that it will be higher. Update A1c this visit.  Discussed eating at consistent times and taking her U-500 prior to eating,-take insulin injections at 7:30 am, 1:30pm, and 6pm  Change U-500 to 25+1 scale with morning and mid day and 15+1 scale with supper.  Check sugars 3-4 times daily.  Notify if problems with high/low sugars especially after dose change done today.  She will also try to work better on her diet and exercise. RTC~2 months.         Relevant Orders      Lipid panel      Hemoglobin A1c   Nontoxic multinodular goiter    Update thyroid tests at  this time. Rule out hyperthyroidism given her current symptoms.  Recent follow up thyroid US showed size stability of isthmic nodule. Report reviewed. Repeat US in 2-3 years.    Relevant Orders      TSH      T4, free     Nervous and Auditory   Diabetic polyneuropathy     Regular foot care. Patient  Follows up regularly with Podiatry. She is on  gabapentin and therapy is being monitored by them.          Other   HYPERLIPIDEMIA, MIXED, WITH HIGH HDL    Update lipids at this time. Obtain recent labs done with her GI physician as well. Continue fish oil.            Tennis Mckinnon PUSHKAR 10/02/2014, 5:52 PM

## 2014-10-01 NOTE — Progress Notes (Signed)
Pre visit review using our clinic review tool, if applicable. No additional management support is needed unless otherwise documented below in the visit note. 

## 2014-10-02 MED ORDER — INSULIN REGULAR HUMAN (CONC) 500 UNIT/ML ~~LOC~~ SOLN: SUBCUTANEOUS | Status: DC

## 2014-10-02 NOTE — Assessment & Plan Note (Signed)
Regular foot care. Patient  Follows up regularly with Podiatry. She is on gabapentin and therapy is being monitored by them.         

## 2014-10-02 NOTE — Assessment & Plan Note (Signed)
Filed Vitals:   10/01/14 1057  BP: 124/68  Pulse: 76   BP well controlled on current regimen. Continue.    Update urine MA this visit, if patient can give a sample.

## 2014-10-02 NOTE — Assessment & Plan Note (Signed)
Last A1c closer to target of around 7.5%. This time, suspect that it will be higher. Update A1c this visit.  Discussed eating at consistent times and taking her U-500 prior to eating,-take insulin injections at 7:30 am, 1:30pm, and 6pm  Change U-500 to 25+1 scale with morning and mid day and 15+1 scale with supper.  Check sugars 3-4 times daily.  Notify if problems with high/low sugars especially after dose change done today.  She will also try to work better on her diet and exercise. RTC~2 months.

## 2014-10-02 NOTE — Assessment & Plan Note (Deleted)
Filed Vitals:   10/01/14 1057  BP: 124/68  Pulse: 76   BP well controlled on current regimen. Continue.    Update urine MA this visit, if patient can give a sample.

## 2014-10-02 NOTE — Assessment & Plan Note (Addendum)
Update thyroid tests at this time. Rule out hyperthyroidism given her current symptoms.  Recent follow up thyroid US showed size stability of isthmic nodule. Report reviewed. Repeat US in 2-3 years.

## 2014-10-02 NOTE — Assessment & Plan Note (Signed)
Update lipids at this time. Obtain recent labs done with her GI physician as well. Continue fish oil.

## 2014-10-03 LAB — LIPID PANEL
Cholesterol: 173 mg/dL (ref 0–200)
HDL: 45.5 mg/dL (ref >39.00)
LDL Cholesterol: 113 mg/dL — ABNORMAL HIGH (ref 0–99)
NONHDL: 127.5
Total CHOL/HDL Ratio: 4
Triglycerides: 72 mg/dL (ref 0.0–149.0)
VLDL: 14.4 mg/dL (ref 0.0–40.0)

## 2014-10-03 LAB — MICROALBUMIN / CREATININE URINE RATIO
Creatinine,U: 92.6 mg/dL
MICROALB UR: 0.3 mg/dL (ref 0.0–1.9)
MICROALB/CREAT RATIO: 0.3 mg/g (ref 0.0–30.0)

## 2014-10-07 ENCOUNTER — Other Ambulatory Visit: Payer: Self-pay | Admitting: Endocrinology

## 2014-10-10 ENCOUNTER — Telehealth: Payer: Self-pay | Admitting: Oncology

## 2014-10-10 NOTE — Telephone Encounter (Signed)
s.w. pt husband and advised on 3.16 time change due to [facility]....ok and aware

## 2014-10-27 ENCOUNTER — Other Ambulatory Visit: Payer: Self-pay

## 2014-10-27 MED ORDER — "INSULIN SYRINGE-NEEDLE U-100 31G X 5/16"" 1 ML MISC": Status: DC

## 2014-11-11 ENCOUNTER — Other Ambulatory Visit: Payer: Self-pay | Admitting: *Deleted

## 2014-11-11 ENCOUNTER — Telehealth: Payer: Self-pay | Admitting: Endocrinology

## 2014-11-11 DIAGNOSIS — E1165 Type 2 diabetes mellitus with hyperglycemia: Principal | ICD-10-CM

## 2014-11-11 DIAGNOSIS — IMO0002 Reserved for concepts with insufficient information to code with codable children: Secondary | ICD-10-CM

## 2014-11-11 DIAGNOSIS — E1140 Type 2 diabetes mellitus with diabetic neuropathy, unspecified: Secondary | ICD-10-CM

## 2014-11-11 MED ORDER — INSULIN REGULAR HUMAN (CONC) 500 UNIT/ML ~~LOC~~ SOLN: SUBCUTANEOUS | Status: DC

## 2014-11-11 NOTE — Telephone Encounter (Signed)
insulin regular human CONCENTRATED (HUMULIN R) 500 UNIT/ML SOLN injection Medication

## 2014-11-17 ENCOUNTER — Telehealth: Payer: Self-pay | Admitting: Nurse Practitioner

## 2014-11-17 ENCOUNTER — Ambulatory Visit (INDEPENDENT_AMBULATORY_CARE_PROVIDER_SITE_OTHER): Payer: BLUE CROSS/BLUE SHIELD | Admitting: Emergency Medicine

## 2014-11-17 VITALS — BP 124/74 | HR 72 | Temp 97.6°F | Resp 16 | Ht 69.0 in | Wt 284.0 lb

## 2014-11-17 DIAGNOSIS — B3749 Other urogenital candidiasis: Secondary | ICD-10-CM

## 2014-11-17 DIAGNOSIS — N898 Other specified noninflammatory disorders of vagina: Secondary | ICD-10-CM

## 2014-11-17 LAB — POCT WET PREP WITH KOH
Bacteria Wet Prep HPF POC: NEGATIVE
CLUE CELLS WET PREP PER HPF POC: NEGATIVE
KOH Prep POC: NEGATIVE
RBC WET PREP PER HPF POC: NEGATIVE
TRICHOMONAS UA: NEGATIVE
Yeast Wet Prep HPF POC: NEGATIVE

## 2014-11-17 MED ORDER — FLUCONAZOLE 100 MG PO TABS: ORAL_TABLET | ORAL | Status: DC

## 2014-11-17 NOTE — Patient Instructions (Signed)

## 2014-11-17 NOTE — Telephone Encounter (Signed)
Spoke to patient, she is not a patient of [facility], advised to call her PCP regarding an appointment. Spoke with Dr. Howell Rucks and advised pt to call her PCP to schedule an appointment.  verbalized understanding

## 2014-11-17 NOTE — Progress Notes (Signed)
[facility] [address], [place] [address] [phone]  Date:  11/17/2014   Name:  Daisy Larson   DOB:  11-Sep-1951   MRN:  [id number]  PCP:  Gildardo Cranker, MD    Chief Complaint: Vaginal Discharge   History of Present Illness:  Daisy Larson is a 64 y.o. very pleasant female patient who presents with the following:  Patient with a history of type 2 diabetes.  On insulin.  Says has not been under good control recently due to psoriasis medications Has vaginal and perianal itching and vaginal discharge.  No antibiotics or steroids No fever or chills. No improvement with over the counter medications or other home remedies.  Denies other complaint or health concern today.   Patient Active Problem List   Diagnosis Date Noted  . Hx of Bell's palsy 08/27/2014  . Essential hypertension 08/27/2014  . History of cardiac dysrhythmia 08/27/2014  . Charcot's joint of right foot 08/27/2014  . Chronic hypercapnic respiratory failure 08/27/2014  . Long-term insulin use 08/27/2014  . Depression, major, recurrent 08/27/2014  . Nontoxic multinodular goiter 06/24/2014  . Osteopenia 11/13/2013  . Thrombocytopenia, unspecified 05/12/2013  . Iron deficiency anemia 10/16/2012  . Encephalopathy acute 10/09/2012  . OSA (obstructive sleep apnea) 10/09/2012  . 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 Pica in adults 10/08/2012  . Anemia 10/08/2012  . Syncope 10/07/2012  . History of breast cancer in female 01/19/2012  . Cancer of upper-outer quadrant of female breast 01/09/2012  . Type 2 diabetes, uncontrolled, with neuropathy 12/27/2010  . RESTLESS LEGS SYNDROME 12/27/2010  . Diabetic polyneuropathy 12/27/2010  . DIABETES MELLITUS, TYPE II, UNCONTROLLED 09/14/2010  . HYPERLIPIDEMIA, MIXED, WITH HIGH HDL 09/14/2010  . Essential hypertension, benign 09/14/2010  . PERSONAL HISTORY OF COLONIC POLYPS 09/14/2010    Past Medical History  Diagnosis Date  .  Cirrhosis   . Multiple thyroid nodules   . History of stomach ulcers   . Hernia   . Colon polyps 12/02/2012    TUBULAR ADENOMAS (X2) and Hyperplastic (X1)  . Neuropathy of foot   . Restless legs   . Complication of anesthesia     WITH SECON HERNIA REPAIR 2012 [facility]  DIFFICULTY O2 SAT DROPPING ADMIT TO HOSPITAL   . Bundle branch block   . Hypertension   . Angina     COSTOCONDRITIS    . Shortness of breath     WITH EXERTION   . Diabetes mellitus   . Hepatitis      Did Not have hepatitis but needed Hep A and Hep B injections2012 SPOTS ON LIVER  DR Alonza Bogus  [phone]  . GERD (gastroesophageal reflux disease)     ULCERS  . Hyperthyroidism     NODULES ON THYROID  (DR BALEN 37  12-609)  . Goiter   . Headache(784.0)     MIGRAINES  . Neuromuscular disorder     PERIPH NEUROPATHY   . Anxiety   . Depression   . Breast cancer 01/19/12     right breast lumpectomy/ER/PR positibe,her-2 neg.  . Allergy     tape  . S/P radiation therapy 02/19/12 - 04/01/12    Right Breast: 5000 cgy/25 Fractions with Boost/ 1000 cGy/5 Fractions  . Iron deficiency anemia 10/16/2012  . Heart murmur   . Gastritis     Past Surgical History  Procedure Laterality Date  . Appendectomy    . Tonsillectomy    . Cesarean section    .  Tubal ligation    . Abdominal hysterectomy    . Back surgery    . Hernia repair      X 2  . Anal fissure repair    . Carpal tunnel release      LEFT   . Colonoscopy w/ polypectomy  12/02/2012  . Breast surgery      BIOPSY  Right Breast -  Sentinel Lymph Nodes  . Liver biopsy  03/28/11    [facility] - Cirrhosis  . Spinal fusion    . Esophagogastroduodenoscopy  12/02/2012    Gastritis    History  Substance Use Topics  . Smoking status: Never Smoker   . Smokeless tobacco: Never Used  . Alcohol Use: No    Family History  Problem Relation Age of Onset  . Lung cancer Maternal Grandfather   . Colon cancer Paternal Grandfather   . Diabetes Mother   .  Hypertension Mother   . Heart disease Maternal Uncle   . Diabetes Sister   . Heart disease Sister   . Thyroid disease Sister   . Heart disease Father   . Hypertension Father   . Diabetes Daughter   . Hypertension Other   . Heart disease Other     Allergies  Allergen Reactions  . Other     Per Patient - she has been told she can not have general anesthesia "unless life-threatening"  . Tape   . Barbiturates     "sleeps forever"  . Oxymorphone     lethargic    Medication list has been reviewed and updated.  Current Outpatient Prescriptions on File Prior to Visit  Medication Sig Dispense Refill  . anastrozole (ARIMIDEX) 1 MG tablet Take 1 tablet (1 mg total) by mouth daily. 90 tablet 2  . aspirin 81 MG tablet Take 81 mg by mouth daily.    . B Complex-C (SUPER B COMPLEX PO) Take 1 tablet by mouth daily.    Marland Kitchen buPROPion (WELLBUTRIN XL) 300 MG 24 hr tablet Take 1 tablet by mouth Daily.    . calcium-vitamin D (OSCAL WITH D) 500-200 MG-UNIT per tablet Take 1 tablet by mouth daily.    Marland Kitchen CINNAMON PO Take 2 tablets by mouth daily.    . citalopram (CELEXA) 10 MG tablet 20 mg.     . DULoxetine (CYMBALTA) 60 MG capsule Take 60 mg by mouth daily.    . furosemide (LASIX) 40 MG tablet     . gabapentin (NEURONTIN) 800 MG tablet 800 mg 3 (three) times daily.     Marland Kitchen glucose blood test strip Use as instructed 1 each by Other route as needed for other. Use as instructed Dx: E11.65 300 each 1  . hydrocortisone-pramoxine (ANALPRAM-HC) 2.5-1 % rectal cream Place rectally as needed for hemorrhoids. 30 g 0  . insulin regular human CONCENTRATED (HUMULIN R) 500 UNIT/ML SOLN injection Use three times daily with meals. Max dose 80 units per day. This is equivalent to 400 units of U-100 insulin per day. 60 mL 3  . Insulin Syringe-Needle U-100 (B-D INS SYR ULTRAFINE 1CC/31G) 31G X 5/16" 1 ML MISC Use as Directed Three Times Daily. 300 each 1  . lactulose (CEPHULAC) 20 G packet Take 20 g by mouth 3 (three) times  daily.    Marland Kitchen lisinopril (PRINIVIL,ZESTRIL) 20 MG tablet Take 20 mg by mouth every morning. Take in the morning    . loratadine (CLARITIN) 10 MG tablet Take 10 mg by mouth daily.    Marland Kitchen  Magnesium 400 MG CAPS Take 1 capsule by mouth daily.    . meclizine (ANTIVERT) 12.5 MG tablet Take 12.5 mg by mouth 3 (three) times daily as needed.    . MULTIPLE VITAMIN PO Take 1 tablet by mouth daily.    . Omega-3 Fatty Acids (EQL OMEGA 3 FISH OIL) 1400 MG CAPS Take 1 capsule by mouth daily.    Marland Kitchen OVER THE COUNTER MEDICATION Take 1 tablet by mouth daily. "Grape seed and resveratrol"    . pantoprazole (PROTONIX) 40 MG tablet     . polyethylene glycol (MIRALAX / GLYCOLAX) packet Take 17 g by mouth daily.    . pramipexole (MIRAPEX) 1 MG tablet Take 2 mg by mouth at bedtime.     . propranolol (INDERAL LA) 60 MG 24 hr capsule Take 60 mg by mouth daily. Take at night    . spironolactone (ALDACTONE) 50 MG tablet     . traMADol (ULTRAM) 50 MG tablet Take 50 mg by mouth every 6 (six) hours as needed for pain.    . vitamin B-12 (CYANOCOBALAMIN) 1000 MCG tablet Take 1,000 mcg by mouth daily.    Marland Kitchen XIFAXAN 550 MG TABS      No current facility-administered medications on file prior to visit.    Review of Systems:  As per HPI, otherwise negative.    Physical Examination: Filed Vitals:   11/17/14 1715  BP: 124/74  Pulse: 72  Temp: 97.6 F (36.4 C)  Resp: 16   Filed Vitals:   11/17/14 1715  Height: 5' 9" (1.753 m)  Weight: 284 lb (128.822 kg)   Body mass index is 41.92 kg/(m^2). Ideal Body Weight: Weight in (lb) to have BMI = 25: 168.9   GEN: obese, NAD, Non-toxic, Alert & Oriented x 3 HEENT: Atraumatic, Normocephalic.  Ears and Nose: No external deformity. EXTR: No clubbing/cyanosis/edema NEURO: Normal gait.  PSYCH: Normally interactive. Conversant. Not depressed or anxious appearing.  Calm demeanor.    Assessment and Plan: Candidiasis Diflucan  Signed,  Ellison Carwin, MD   Results for  orders placed or performed in visit on 11/17/14  POCT Wet Prep with KOH  Result Value Ref Range   Trichomonas, UA Negative    Clue Cells Wet Prep HPF POC NEG    Epithelial Wet Prep HPF POC 3-14    Yeast Wet Prep HPF POC NEG    Bacteria Wet Prep HPF POC NEG    RBC Wet Prep HPF POC NEG    WBC Wet Prep HPF POC 0-3    KOH Prep POC Negative

## 2014-11-17 NOTE — Telephone Encounter (Signed)
[facility] Patient Name: Daisy Larson DOB: 07-Feb-1951 Initial Comment Caller states c/o yeast infection Nurse Assessment Nurse: Markus Daft, RN, [place]y Date/Time (Eastern Time): 11/17/2014 2:03:26 PM Confirm and document reason for call. If symptomatic, describe symptoms. ---Caller states c/o yeast infection s/s including redness, swelling, moderate - severe itching and milky white discharge. Used OTC Monistat, and helped some, but now back worse. S/S started off/ on for a month. (going on vacation Saturday). Has the patient traveled out of the country within the last 30 days? ---Not Applicable Does the patient require triage? ---Yes Related visit to physician within the last 2 weeks? ---No Does the PT have any chronic conditions? (i.e. diabetes, asthma, etc.) ---Yes List chronic conditions. ---IDDM, Cirrhosis of Liver, HTN - Lisinopril Guidelines Guideline Title Affirmed Question Affirmed Notes Vaginal Discharge [1] Symptoms of a "yeast infection" (i.e., itchy, white discharge, not bad smelling) AND [2] not improved > 3 days following CARE ADVICE Final Disposition User See PCP When Office is Open (within 3 days) Markus Daft, Therapist, sports, Windy Comments Appt set for tomorrow at 4 pm with Lorane Gell NP

## 2014-11-18 ENCOUNTER — Ambulatory Visit: Payer: Self-pay | Admitting: Nurse Practitioner

## 2014-11-23 ENCOUNTER — Telehealth: Payer: Self-pay | Admitting: Oncology

## 2014-11-23 NOTE — Telephone Encounter (Signed)
Due to [facility] 3/16 appt moved to PM. left message for pt and mailed new schedule.

## 2014-12-07 ENCOUNTER — Ambulatory Visit (INDEPENDENT_AMBULATORY_CARE_PROVIDER_SITE_OTHER): Payer: BLUE CROSS/BLUE SHIELD | Admitting: Physician Assistant

## 2014-12-07 ENCOUNTER — Telehealth: Payer: Self-pay | Admitting: Gastroenterology

## 2014-12-07 ENCOUNTER — Encounter: Payer: Self-pay | Admitting: Physician Assistant

## 2014-12-07 VITALS — BP 118/58 | HR 76 | Ht 67.75 in | Wt 281.5 lb

## 2014-12-07 DIAGNOSIS — K648 Other hemorrhoids: Secondary | ICD-10-CM

## 2014-12-07 DIAGNOSIS — K7460 Unspecified cirrhosis of liver: Secondary | ICD-10-CM

## 2014-12-07 DIAGNOSIS — K625 Hemorrhage of anus and rectum: Secondary | ICD-10-CM

## 2014-12-07 MED ORDER — LIDOCAINE-HYDROCORTISONE ACE 3-0.5 % RE CREA: TOPICAL_CREAM | RECTAL | Status: DC

## 2014-12-07 NOTE — Patient Instructions (Signed)
We sent a prescription for Anamantle Cream to [facility], [place].  USe it 4 times daily for 2 weeks then as needed.  Use moistened tissue/ baby wipes.  We made you a follow up appointment with Dr. Zenovia Jarred for 02-01-2015 at 2:45 PM.  Please call us if you need seen sooner and Amy will be glad to see you again.

## 2014-12-07 NOTE — Progress Notes (Addendum)
Patient ID: Daisy Larson, female   DOB: [birth year], 64 y.o.   MRN: [id number]   Subjective:    Patient ID: Daisy Larson, female    DOB: 09-19-1951, 64 y.o.   MRN: [id number]  HPI Daisy Larson is a pleasant 64 year old white female previously known to Dr. Sharlett Iles. She has multiple medical issues including history of breast cancer, diabetes mellitus, hypertension, morbid obesity, sleep apnea for which she uses a CPAP. And decompensated cirrhosis of uncertain etiology. She also has history of colon polyps, anemia, and depression. She is currently being followed by Dr. Mosetta Pigeon at [place] from a hepatology standpoint. She has had chronic problems with encephalopathy and is maintained on Chronulac and Xifaxan. She comes to our office today with complaints of hemorrhoidal discomfort and bleeding. She had undergone colonoscopy with Dr. Sharlett Iles in February 2014 she was noted to have 4 sessile polyps which were removed path on these consistent with tubular adenomas, also severe diverticulosis and large external hemorrhoids no varices. EGD was also done at that same time no varices noted she had nodular gastritis and some inflammatory polyps noted. Says she has had ongoing problems with bleeding from hemorrhoids over the past year or so. She has occasional oozing and sometimes just passes small amounts of bright red blood into the commode. She has more recently developed swelling itching and discomfort. She said yesterday she woke up with rectal pressure which was bothering her. She uses MiraLAX as needed and also is on a regular regimen with Chronulac in addition to the MiraLAX to produce 2-3 loose bowel movements daily. Next line Patient's husband reports that she has had labs drawn regularly through [facility].  Review of Systems Pertinent positive and negative review of systems were noted in the above HPI section.  All other review of systems was otherwise negative.  Outpatient Encounter  Prescriptions as of 12/07/2014  Medication Sig  . anastrozole (ARIMIDEX) 1 MG tablet Take 1 tablet (1 mg total) by mouth daily.  Marland Kitchen aspirin 81 MG tablet Take 81 mg by mouth daily.  Marland Kitchen b complex vitamins capsule Take 1 capsule by mouth daily.   . B Complex-C (SUPER B COMPLEX PO) Take 1 tablet by mouth daily.  Marland Kitchen buPROPion (WELLBUTRIN XL) 300 MG 24 hr tablet Take 1 tablet by mouth Daily.  . calcium-vitamin D (OSCAL WITH D) 500-200 MG-UNIT per tablet Take 1 tablet by mouth daily.  . Cinnamon 500 MG capsule Take 500 mg by mouth daily.   . citalopram (CELEXA) 10 MG tablet 20 mg.   . DULoxetine (CYMBALTA) 60 MG capsule Take 60 mg by mouth daily.  . fluconazole (DIFLUCAN) 100 MG tablet 2 today and one daily for 10 days  . fluticasone (FLONASE) 50 MCG/ACT nasal spray Frequency:PRN   Dosage:50   MCG  Instructions:  Note:Dose: 50 MCG  . furosemide (LASIX) 40 MG tablet   . gabapentin (NEURONTIN) 800 MG tablet 800 mg 3 (three) times daily.   Marland Kitchen glucose blood test strip Use as instructed 1 each by Other route as needed for other. Use as instructed Dx: E11.65  . Grape Seed Extract 50 MG CAPS Take by mouth daily.   . hydrocortisone-pramoxine (ANALPRAM-HC) 2.5-1 % rectal cream Place rectally as needed for hemorrhoids.  . insulin regular human CONCENTRATED (HUMULIN R) 500 UNIT/ML SOLN injection Use three times daily with meals. Max dose 80 units per day. This is equivalent to 400 units of U-100 insulin per day.  . Insulin Syringe-Needle U-100 (B-D INS SYR  ULTRAFINE 1CC/31G) 31G X 5/16" 1 ML MISC Use as Directed Three Times Daily.  . Insulin Syringe-Needle U-100 31G X 15/64" 0.3 ML MISC   . lactulose (CEPHULAC) 20 G packet Take 20 g by mouth 3 (three) times daily.  Marland Kitchen lisinopril (PRINIVIL,ZESTRIL) 20 MG tablet Take 20 mg by mouth every morning. Take in the morning  . loratadine (CLARITIN) 10 MG tablet Take 10 mg by mouth daily.  . Magnesium Oxide 400 MG CAPS Take 400 mg by mouth daily.   . meclizine (ANTIVERT) 12.5  MG tablet Take 12.5 mg by mouth 3 (three) times daily as needed.  . Milk Thistle 500 MG CAPS Take 1,000 mg by mouth daily.   . MULTIPLE VITAMIN PO Take 1 tablet by mouth daily.  . Omega-3 1000 MG CAPS Take 1 g by mouth daily.   . Omega-3 Fatty Acids (EQL OMEGA 3 FISH OIL) 1400 MG CAPS Take 1 capsule by mouth daily.  . pantoprazole (PROTONIX) 40 MG tablet   . polyethylene glycol (MIRALAX / GLYCOLAX) packet Take 17 g by mouth daily.  . pramipexole (MIRAPEX) 1 MG tablet Take 2 mg by mouth at bedtime.   . propranolol (INDERAL LA) 60 MG 24 hr capsule Take 60 mg by mouth daily. Take at night  . spironolactone (ALDACTONE) 50 MG tablet   . traMADol (ULTRAM) 50 MG tablet Take 50 mg by mouth every 6 (six) hours as needed for pain.  . vitamin B-12 (CYANOCOBALAMIN) 1000 MCG tablet Take 1,000 mcg by mouth daily.  . [DISCONTINUED] anastrozole (ARIMIDEX) 1 MG tablet Take 1 mg by mouth daily.   . [DISCONTINUED] buPROPion (WELLBUTRIN XL) 300 MG 24 hr tablet Take 300 mg by mouth daily.   . [DISCONTINUED] CINNAMON PO Take 2 tablets by mouth daily.  . [DISCONTINUED] furosemide (LASIX) 40 MG tablet Take 40 mg by mouth.  . [DISCONTINUED] Insulin Syringe-Needle U-100 31G X 5/16" 0.3 ML MISC   . [DISCONTINUED] lactulose (CHRONULAC) 10 GM/15ML solution Take 20 g by mouth.  . [DISCONTINUED] Magnesium 400 MG CAPS Take 1 capsule by mouth daily.  . [DISCONTINUED] OVER THE COUNTER MEDICATION Take 1 tablet by mouth daily. "Grape seed and resveratrol"  . [DISCONTINUED] pramipexole (MIRAPEX) 1 MG tablet Take 1 mg by mouth.  . [DISCONTINUED] spironolactone (ALDACTONE) 50 MG tablet Take 100 mg by mouth.  . [DISCONTINUED] XIFAXAN 550 MG TABS   . lidocaine-hydrocortisone (ANAMANTEL HC) 3-0.5 % CREA Use small application of cream 4 times daily for 2 weeks then as needed.  . [DISCONTINUED] calcium-vitamin D (OSCAL WITH D) 500-200 MG-UNIT per tablet Take 1 tablet by mouth.  . [DISCONTINUED] citalopram (CELEXA) 20 MG tablet Take 20  mg by mouth.  . [DISCONTINUED] gabapentin (NEURONTIN) 800 MG tablet Take 800 mg by mouth.  . [DISCONTINUED] insulin regular human CONCENTRATED (HUMULIN R) 500 UNIT/ML SOLN injection Inject into the skin.  . [DISCONTINUED] propranolol ER (INDERAL LA) 60 MG 24 hr capsule Take 60 mg by mouth.  . [DISCONTINUED] traMADol (ULTRAM) 50 MG tablet Take 50 mg by mouth.   Allergies  Allergen Reactions  . Other Rash    Blisters Per Patient - she has been told she can not have general anesthesia "unless life-threatening"  . Tape   . Oxymorphone     lethargic  . Barbiturates Dermatitis and Other (See Comments)    Drowsiness, patient states that Barbiturates & Narcotics make her extremely sleepy and drowsy 24 Apr 2013 Drowsiness  "sleeps forever"   Patient Active Problem List  Diagnosis Date Noted  . Hx of Bell's palsy 08/27/2014  . Essential hypertension 08/27/2014  . History of cardiac dysrhythmia 08/27/2014  . Charcot's joint of right foot 08/27/2014  . Chronic hypercapnic respiratory failure 08/27/2014  . Long-term insulin use 08/27/2014  . Depression, major, recurrent 08/27/2014  . Nontoxic multinodular goiter 06/24/2014  . Osteopenia 11/13/2013  . Thrombocytopenia, unspecified 05/12/2013  . Iron deficiency anemia 10/16/2012  . Encephalopathy acute 10/09/2012  . OSA (obstructive sleep apnea) 10/09/2012  . 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 Pica in adults 10/08/2012  . Anemia 10/08/2012  . Syncope 10/07/2012  . History of breast cancer in female 01/19/2012  . Cancer of upper-outer quadrant of female breast 01/09/2012  . Type 2 diabetes, uncontrolled, with neuropathy 12/27/2010  . RESTLESS LEGS SYNDROME 12/27/2010  . Diabetic polyneuropathy 12/27/2010  . DIABETES MELLITUS, TYPE II, UNCONTROLLED 09/14/2010  . HYPERLIPIDEMIA, MIXED, WITH HIGH HDL 09/14/2010  . Essential hypertension, benign 09/14/2010  . PERSONAL HISTORY OF COLONIC POLYPS 09/14/2010   History   Social  History  . Marital Status: Married    Spouse Name: N/A    Number of Children: N/A  . Years of Education: N/A   Occupational History  . Retired     Social History Main Topics  . Smoking status: Never Smoker   . Smokeless tobacco: Never Used  . Alcohol Use: No  . Drug Use: No  . Sexual Activity: Not on file   Other Topics Concern  . Not on file   Social History Narrative   Former Chief Strategy Officer. Married to Strang who is a Theme park manager and who underwent a Heart Transplant at [facility] in May 2011.  2 children and 1 grandchild.      Daily caffeine     Ms. Flippo's family history includes Colon cancer in her paternal grandfather; Diabetes in her daughter, mother, and sister; Heart disease in her father, maternal uncle, other, and sister; Hypertension in her father, mother, and other; Lung cancer in her maternal grandfather; Thyroid disease in her sister.      Objective:    Filed Vitals:   12/07/14 1455  BP: 118/58  Pulse: 76    Physical Exam  well-developed older white female in no acute distress, somewhat chronically ill-appearing blood pressure 118/58 pulse 76 height 5 foot 7 weight 281 or D9.3. HEENT; nontraumatic normocephalic EOMI PERRLA sclera anicteric, positive hepatic fetor, Supple no JVD, Cardiovascular; regular rate and rhythm with S1-S2 no murmur or gallop, Pulmonary; clear bilaterally, Abdomen ;obese soft no palpable mass or hepatis or megaly no focal tenderness, bowel sounds are present, Rectal; exam she has external hemorrhoidal tags present, on digital exam no gross blood, palpable edematous hemorrhoid, Extremities; no clubbing cyanosis or edema skin warm and dry, Psych; mood and affect appropriate     Assessment & Plan:   #1 64 yo female with symptomatic internal hemorrhoids and intermittent rectal bleeding- no varices  At time of colonoscopy 2014 #2 decompensated  cirrhosis - etiology unknown - followed at [facility] - Dr. Mosetta Pigeon #3 chronic  hepatic encephalopathy #4 OSA #5 Hx adenomatous colon polyps- due for f/u 2019 #6Diverticulosis #7 Htn #8 DM #9 hx breast cancer  Plan; Anamantle cream 2.5% 3-4 x daily x 10 days then as needs, rectal care reviewed Follow up in 4-6 weeks Dr Hilarie Fredrickson to establish Pt asked to call in 2 weeks -if sxs not improved will reassess- may need a Flex to re-look prior to any attempt at banding  Daisy  S Esterwood PA-C 12/07/2014   Addendum: Reviewed and agree with initial management. Would agree that in the setting of cirrhosis with portal hypertension direct visualization would need to be performed before any consideration of banding to exclude rectal varices. Jerene Bears, MD

## 2014-12-07 NOTE — Telephone Encounter (Signed)
Patient does not have a preference for new GI MD. She is having problems with bleeding hemorrhoids. Scheduled with Nicoletta Ba, PA today at 3:00 PM.

## 2014-12-16 ENCOUNTER — Encounter: Payer: Self-pay | Admitting: Endocrinology

## 2014-12-16 ENCOUNTER — Ambulatory Visit (INDEPENDENT_AMBULATORY_CARE_PROVIDER_SITE_OTHER): Payer: BLUE CROSS/BLUE SHIELD | Admitting: Endocrinology

## 2014-12-16 VITALS — BP 136/72 | HR 69 | Resp 16 | Ht 67.75 in | Wt 290.5 lb

## 2014-12-16 DIAGNOSIS — E1165 Type 2 diabetes mellitus with hyperglycemia: Secondary | ICD-10-CM

## 2014-12-16 DIAGNOSIS — E1140 Type 2 diabetes mellitus with diabetic neuropathy, unspecified: Secondary | ICD-10-CM

## 2014-12-16 DIAGNOSIS — IMO0002 Reserved for concepts with insufficient information to code with codable children: Secondary | ICD-10-CM

## 2014-12-16 DIAGNOSIS — E782 Mixed hyperlipidemia: Secondary | ICD-10-CM

## 2014-12-16 DIAGNOSIS — E1142 Type 2 diabetes mellitus with diabetic polyneuropathy: Secondary | ICD-10-CM

## 2014-12-16 DIAGNOSIS — I10 Essential (primary) hypertension: Secondary | ICD-10-CM

## 2014-12-16 LAB — POCT GLUCOSE (DEVICE FOR HOME USE)

## 2014-12-16 NOTE — Assessment & Plan Note (Signed)
A1c far from goal. Having some mid day hypoglycemia.  Discussed eating at consistent times and taking her U-500 prior to eating,-take insulin injections at 7:30 am, 1:30pm, and 6pm  Change U-500 to 23+1 scale with morning and 27+ 1 at mid day and 17+1 scale with supper.  Check sugars 3-4 times daily.  Notify if problems with high/low sugars especially after dose change done today.  She will also try to work better on her diet and exercise. RTC~2 months.

## 2014-12-16 NOTE — Progress Notes (Signed)
Pre visit review using our clinic review tool, if applicable. No additional management support is needed unless otherwise documented below in the visit note. 

## 2014-12-16 NOTE — Patient Instructions (Signed)
Check sugars 4 x daily ( before each meal and at bedtime).  Record them in a log book and bring that/meter to next appointment.    Blood Glucose (mg/dL)  Breakfast  (Units U500 Insulin)  Lunch  (Units U500 Insulin)  Supper  (Units U500 Insulin)  Nighttime (Units U500 Insulin)   <70 Treat the low blood sugar.  Recheck blood glucose  in 15 mins. If sugar is more than 70, then take the number of units of insulin in the 70-90 row, if before a meal.   70-90  14  16  10   0  91-130 (Base Dose)  23  27  17   0   131-150  24  28  18   0   151-200  25  29  19   0   201-250  26  30  20  0   251-300  27  31  21  0   301-350  28  32  22 0   351-400  29  33  23 0   401-450  30  34  24 0   >450  31  35  25  0

## 2014-12-16 NOTE — Assessment & Plan Note (Signed)
Recent LDL close to target.  Continue fish oil.

## 2014-12-16 NOTE — Progress Notes (Signed)
REASON FOR VISIT- Daisy Larson is a 64 y.o.-year-old female, here for follow up management of Type 2 Diabetes Mellitus, uncontrolled, with complications ( diagnosed around 2003 , is now on U-500 insulin. Has complications of  neuropathy).   Last seen by me Dec 2015.  * Metformin  taken off due to cirrhosis.  * Tried Victoza in the past, but requested to be taken off due to fear of developing cancer while on this medication.  * Was on Levemir and Novolog prior to switching to U-500. Requiring high doses of insulin due to round the clock lactulose use due to her liver condition.  * had an interim episode of hemorrhoidal bleeding and vaginal candidiasis. Requests refill on Fluconazole if she were to need it.    Currently taking  -Humulin U-500 at 25+1 scale with BF and lunch and 15+1 scale with supper. Since the last visit, the patient has tried to take the U500 at with her meals, but finds that she is going low prior to lunch and snacking often through the day.  Breakfast is usually around 8 AM, lunch between 1-2  PM , and dinner between 6 to 8 PM.  Last few hemoglobin A1cs are as follows-  Lab Results  Component Value Date   HGBA1C 9.1* 10/01/2014   HGBA1C 7.8* 06/23/2014   HGBA1C 9.6* 01/20/2014     Patient checks her sugars 3-4 times daily with a  One touch verio glucometer. Reports that the test strips are being wasted by the meter recently.  By meter review her sugars are-  PREMEAL Breakfast Lunch Dinner Bedtime Overall  Glucose range: 142-278 48-488 99-452 324-375   Mean/median:         Hypoglycemia-  Few recent lows mid day.   she has hypoglycemia awareness.     Dietary Habits- Has been eating consistently and limiting carbs. Lately, hasn't done so well and eating "emotionally" and more comfort foods due to the weather. Exercise- Nothing scheduled.  Looking to go back on her exercise program with the bike at home-hasn't done that since last time. Weight- Weight has  been  uptrending recently, but she has a lot of weight fluctuations due to her leg edema for which diuretic doses are being adjusted. Wt Readings from Last 3 Encounters:  12/16/14 290 lb 8 oz (131.77 kg)  12/07/14 281 lb 8 oz (127.688 kg)  11/17/14 284 lb (128.822 kg)    Diabetes Complications-  Nephropathy-- No  CKD, last BUN/creatinine- Had recent labs with Dr Print production planner at [facility] recently.  Lab Results  Component Value Date   BUN 18 09/22/2014   CREATININE 1.4* 09/22/2014    Lab Results  Component Value Date   GFR 72.93 11/04/2012   Lab Results  Component Value Date   MICRALBCREAT 0.3 10/01/2014     Retinopathy- No,  Gets regular eye exams-UTD May 2015.Has rapid development of cataracts in the past year, and left sided surgery done since last time Neuropathy- Has numbness and tingling in her feet. known neuropathy and BK numbness B/L and [place]. Follows up with podiatry- sees new doctor now.  Is on gabapentin. Also, having some sensory changes in upper extremities related to her trigger fingers. Recently had surgery for this as well.  Associated history - No history of CAD or prior stroke. No hyperlipidemia. No hypothyroidism. her last set of lipids were-  Lab Results  Component Value Date   CHOL 173 10/01/2014   HDL 45.50 10/01/2014   LDLCALC 113* 10/01/2014  TRIG 72.0 10/01/2014   CHOLHDL 4 10/01/2014   her last TSH was  Lab Results  Component Value Date   TSH 1.57 10/01/2014   Denies weight loss. Has been feeling "hot" all the time and increased sweating. Diarrhea related to chronic lactulose use. Denies tremors, palpitations.   I have reviewed the patient's past medical history, medications and allergies.   HTN- BP well controlled today on current regimen. Regimen includes Lisinopril.   Non-toxic Multinodular Goiter-  The patient had a nodule FNAed last Fall 2015, and results were benign. reviewed records from [facility].  Previously nodules were being  monitored by Dr Chalmers Cater.  Left of midline thyroid nodule FNA was benign ( 13 x 7 x 38mm) done at [facility], Nov 18th 2014   Last Thyroid US 08/18/2013:[place] [facility] Denies noticing any enlargement in region of thyroid , change in voice or lumps in neck. Reports occasional choking episodes and trouble swallowing.   REVIEW OF SYSTEMS- Review of Systems- [ x ]  Complains of    [  ]  denies [ x ] Recent weight change [ x ]  Fatigue [  ] polydipsia [  ] polyuria [  ]  nocturia [ x ]  vision difficulty ( b/l blurry) [  ] chest pain [ x ] shortness of breath [  x] leg swelling [  ] cough [ ]  nausea/vomiting [ x ] diarrhea [ x ] constipation [  ] abdominal pain [ x ]  tingling/numbness in extremities [  ]  concern with feet ( wounds/sores)    PHYSICAL EXAM- BP 136/72 mmHg  Pulse 69  Resp 16  Ht 5' 7.75" (1.721 m)  Wt 290 lb 8 oz (131.77 kg)  BMI 44.49 kg/m2  SpO2 99% Wt Readings from Last 3 Encounters:  12/16/14 290 lb 8 oz (131.77 kg)  12/07/14 281 lb 8 oz (127.688 kg)  11/17/14 284 lb (128.822 kg)   Exam: deferred  ASSESSMENT/PLAN-     Problem List Items Addressed This Visit      Cardiovascular and Mediastinum   Essential hypertension    Filed Vitals:   10/01/14 1057  BP: 124/68  Pulse: 76   BP well controlled on current regimen. Continue.    Urine MA normal 09/2014          Endocrine   Type 2 diabetes, uncontrolled, with neuropathy - Primary    A1c far from goal. Having some mid day hypoglycemia.  Discussed eating at consistent times and taking her U-500 prior to eating,-take insulin injections at 7:30 am, 1:30pm, and 6pm  Change U-500 to 23+1 scale with morning and 27+ 1 at mid day and 17+1 scale with supper.  Check sugars 3-4 times daily.  Notify if problems with high/low sugars especially after dose change done today.  She will also try to work better on her diet and exercise. RTC~2 months.               Nervous and Auditory   Diabetic  polyneuropathy     Regular foot care. Patient  Follows up regularly with Podiatry. She is on gabapentin and therapy is being monitored by them.              Other   HYPERLIPIDEMIA, MIXED, WITH HIGH HDL    Recent LDL close to target.  Continue fish oil.             Given new verio and mini meters for them to try. They will  call in request for test strips for the meter they like.  Rx for fluconazole not given today, as she should have some left over from the recent Rx given by UC in Jan.    [place] 12/16/2014, 4:36 PM

## 2014-12-16 NOTE — Assessment & Plan Note (Signed)
Regular foot care. Patient  Follows up regularly with Podiatry. She is on gabapentin and therapy is being monitored by them.

## 2014-12-16 NOTE — Assessment & Plan Note (Signed)
Filed Vitals:   10/01/14 1057  BP: 124/68  Pulse: 76   BP well controlled on current regimen. Continue.    Urine MA normal 09/2014

## 2014-12-28 ENCOUNTER — Encounter: Payer: Self-pay | Admitting: Endocrinology

## 2015-01-05 ENCOUNTER — Other Ambulatory Visit: Payer: Self-pay | Admitting: *Deleted

## 2015-01-05 DIAGNOSIS — C50419 Malignant neoplasm of upper-outer quadrant of unspecified female breast: Secondary | ICD-10-CM

## 2015-01-06 ENCOUNTER — Other Ambulatory Visit (HOSPITAL_BASED_OUTPATIENT_CLINIC_OR_DEPARTMENT_OTHER): Payer: BLUE CROSS/BLUE SHIELD

## 2015-01-06 DIAGNOSIS — C50419 Malignant neoplasm of upper-outer quadrant of unspecified female breast: Secondary | ICD-10-CM

## 2015-01-06 DIAGNOSIS — C50411 Malignant neoplasm of upper-outer quadrant of right female breast: Secondary | ICD-10-CM

## 2015-01-06 LAB — COMPREHENSIVE METABOLIC PANEL (CC13)
ALK PHOS: 184 U/L — AB (ref 40–150)
ALT: 44 U/L (ref 0–55)
AST: 63 U/L — ABNORMAL HIGH (ref 5–34)
Albumin: 3.2 g/dL — ABNORMAL LOW (ref 3.5–5.0)
Anion Gap: 11 mEq/L (ref 3–11)
BILIRUBIN TOTAL: 1.35 mg/dL — AB (ref 0.20–1.20)
BUN: 19 mg/dL (ref 7.0–26.0)
CHLORIDE: 102 meq/L (ref 98–109)
CO2: 24 mEq/L (ref 22–29)
Calcium: 9.8 mg/dL (ref 8.4–10.4)
Creatinine: 1.3 mg/dL — ABNORMAL HIGH (ref 0.6–1.1)
EGFR: 44 mL/min/{1.73_m2} — AB (ref >90)
Glucose: 263 mg/dl — ABNORMAL HIGH (ref 70–140)
Potassium: 4.8 mEq/L (ref 3.5–5.1)
SODIUM: 137 meq/L (ref 136–145)
Total Protein: 7.6 g/dL (ref 6.4–8.3)

## 2015-01-06 LAB — CBC WITH DIFFERENTIAL/PLATELET
BASO%: 0.6 % (ref 0.0–2.0)
BASOS ABS: 0 10*3/uL (ref 0.0–0.1)
EOS ABS: 0.1 10*3/uL (ref 0.0–0.5)
EOS%: 2 % (ref 0.0–7.0)
HCT: 40.4 % (ref 34.8–46.6)
HEMOGLOBIN: 14 g/dL (ref 11.6–15.9)
LYMPH%: 20.1 % (ref 14.0–49.7)
MCH: 33.9 pg (ref 25.1–34.0)
MCHC: 34.7 g/dL (ref 31.5–36.0)
MCV: 97.8 fL (ref 79.5–101.0)
MONO#: 0.7 10*3/uL (ref 0.1–0.9)
MONO%: 9.7 % (ref 0.0–14.0)
NEUT%: 67.6 % (ref 38.4–76.8)
NEUTROS ABS: 4.7 10*3/uL (ref 1.5–6.5)
Platelets: 121 10*3/uL — ABNORMAL LOW (ref 145–400)
RBC: 4.13 10*6/uL (ref 3.70–5.45)
RDW: 13.7 % (ref 11.2–14.5)
WBC: 7 10*3/uL (ref 3.9–10.3)
lymph#: 1.4 10*3/uL (ref 0.9–3.3)

## 2015-01-12 ENCOUNTER — Telehealth: Payer: Self-pay | Admitting: Physician Assistant

## 2015-01-12 MED ORDER — HYDROCORTISONE ACETATE 25 MG RE SUPP: 25.0000 mg | Freq: Two times a day (BID) | RECTAL | Status: DC

## 2015-01-12 NOTE — Telephone Encounter (Signed)
Pt states her hemorrhoids are much worse. Requests to be seen but wants some other meds to help prior to being seen. Pt scheduled to see Nicoletta Ba PA Thursday at 2pm. Pt aware of appt. Per Nicoletta Ba PA script sent to pharmacy for Anusol HC supp.

## 2015-01-13 ENCOUNTER — Ambulatory Visit (HOSPITAL_BASED_OUTPATIENT_CLINIC_OR_DEPARTMENT_OTHER): Payer: BLUE CROSS/BLUE SHIELD | Admitting: Nurse Practitioner

## 2015-01-13 ENCOUNTER — Other Ambulatory Visit: Payer: Self-pay | Admitting: *Deleted

## 2015-01-13 ENCOUNTER — Ambulatory Visit (HOSPITAL_BASED_OUTPATIENT_CLINIC_OR_DEPARTMENT_OTHER): Payer: BLUE CROSS/BLUE SHIELD

## 2015-01-13 ENCOUNTER — Ambulatory Visit: Payer: BC Managed Care – PPO | Admitting: Oncology

## 2015-01-13 ENCOUNTER — Encounter: Payer: Self-pay | Admitting: Nurse Practitioner

## 2015-01-13 ENCOUNTER — Ambulatory Visit: Payer: BC Managed Care – PPO

## 2015-01-13 ENCOUNTER — Telehealth: Payer: Self-pay | Admitting: Oncology

## 2015-01-13 VITALS — BP 109/63 | HR 78 | Temp 97.5°F | Resp 19 | Ht 67.75 in | Wt 288.9 lb

## 2015-01-13 DIAGNOSIS — M8580 Other specified disorders of bone density and structure, unspecified site: Secondary | ICD-10-CM | POA: Diagnosis not present

## 2015-01-13 DIAGNOSIS — Z853 Personal history of malignant neoplasm of breast: Secondary | ICD-10-CM

## 2015-01-13 DIAGNOSIS — C50411 Malignant neoplasm of upper-outer quadrant of right female breast: Secondary | ICD-10-CM | POA: Diagnosis not present

## 2015-01-13 DIAGNOSIS — C50911 Malignant neoplasm of unspecified site of right female breast: Secondary | ICD-10-CM

## 2015-01-13 DIAGNOSIS — C50419 Malignant neoplasm of upper-outer quadrant of unspecified female breast: Secondary | ICD-10-CM

## 2015-01-13 MED ORDER — ANASTROZOLE 1 MG PO TABS: 1.0000 mg | ORAL_TABLET | Freq: Every day | ORAL | Status: DC

## 2015-01-13 MED ORDER — ZOLEDRONIC ACID 4 MG/100ML IV SOLN
4.0000 mg | Freq: Once | INTRAVENOUS | Status: AC
  Administered 2015-01-13: 4 mg via INTRAVENOUS
  Filled 2015-01-13: qty 100

## 2015-01-13 MED ORDER — SODIUM CHLORIDE 0.9 % IV SOLN
Freq: Once | INTRAVENOUS | Status: AC
  Administered 2015-01-13: 16:00:00 via INTRAVENOUS

## 2015-01-13 NOTE — Addendum Note (Signed)
Addended by: Marcelino Duster on: 01/13/2015 06:46 PM   Modules accepted: Orders

## 2015-01-13 NOTE — Progress Notes (Signed)
ID: Daisy Larson   DOB: 1951-05-12  MR#: [id number]  NOI#:[id number]  PCP:  Donnie Coffin, MD GYN:   SUR:  Madilyn Hook, OD RADONC:  Eppie Gibson, MD OTHER:  Bevelyn Buckles, MD;  Kara Mead, MD  CHIEF COMPLAINT:  Hs of Right Breast Cancer CURRENT TREATMENT: anastrozole daily   BREAST CANCER HISTORY: The patient had annual mammography 12/29/2011 at [facility], showing  an 11 mm focal nodular density in the upper outer quadrant of the right breast which had increased in size as compared to the prior study a year prior. Ultrasound showed no sonographic abnormality. Stereotactic biopsy was performed 01/03/2012 the month and the pathology showed ([id number]) and invasive ductal carcinoma, grade 2, with extracellular mucin, 100% estrogen receptor and progesterone receptor positive, with an MIB-1 of 8 and no HER-2 amplification. The patient underwent bilateral breast MRI 01/09/2012 showing a solitary enhancing mass in the lateral aspect of the right breast measuring 1.3 cm. There was no enlarged axillary or internal mammary adenopathy and no other abnormality was noted.  The patient was seen at the multidisciplinary breast cancer clinic 01/10/2012 with further treatments as detailed below  INTERVAL HISTORY:  Daisy Larson returns for follow up of her breast cancer, accompanied by her husband and older daughter. She has been on anastrozole since June 2013 and is tolerating this well. She denies hot flashes or vaginal changes. She has chronic joint and back pain secondary to arthritis, and these pains have not intensified since starting this drug. The recent interval history is remarkable for hemorrhoids. She was given anusol and a lidocaine ointment to apply to her rectum. This is much improved but still tender.    REVIEW OF SYSTEMS:  Daisy Larson denies fevers, chills, nausea or vomiting. She has regular bowel movements with the use of miralax, and lactulose for her liver. Her appetite is healthy. She denies mouth  sores or rashes. She has shortness of breath with exertion and fatigue, but denies chest pain, cough, or palpitations. She has a history of a right bundle branch block. She takes tramadol PRN for her headaches. Her blood sugars are poorly controlled and she takes gabapentin for her neuropathy. She frequently drops items she is holding as a result. She complains of "charcot toes" to her right foot that makes it difficult to ambulate. She does not exercise regularly. A detailed review of systems is otherwise stable.   PAST MEDICAL HISTORY: Past Medical History  Diagnosis Date  . Cirrhosis   . Multiple thyroid nodules   . History of stomach ulcers   . Hernia   . Colon polyps 12/02/2012    TUBULAR ADENOMAS (X2) and Hyperplastic (X1)  . Neuropathy of foot   . Restless legs   . Complication of anesthesia     WITH SECON HERNIA REPAIR 2012 [facility]  DIFFICULTY O2 SAT DROPPING ADMIT TO HOSPITAL   . Bundle branch block   . Hypertension   . Angina     COSTOCONDRITIS    . Shortness of breath     WITH EXERTION   . Diabetes mellitus   . Hepatitis      Did Not have hepatitis but needed Hep A and Hep B injections2012 SPOTS ON LIVER  DR Alonza Bogus  [phone]  . GERD (gastroesophageal reflux disease)     ULCERS  . Hyperthyroidism     NODULES ON THYROID  (DR BALEN 37  12-609)  . Goiter   . Headache(784.0)     MIGRAINES  . Neuromuscular disorder  PERIPH NEUROPATHY   . Anxiety   . Depression   . Breast cancer 01/19/12     right breast lumpectomy/ER/PR positibe,her-2 neg.  . Allergy     tape  . S/P radiation therapy 02/19/12 - 04/01/12    Right Breast: 5000 cgy/25 Fractions with Boost/ 1000 cGy/5 Fractions  . Iron deficiency anemia 10/16/2012  . Heart murmur   . Gastritis   She underwent liver biopsy 03/28/2011, at [facility], showing evidence of early cirrhosis. There was mildly active chronic hepatitis (grade 1). She also has a history of thyroid nodules which have been stable on  serial ultrasounds according to the patient  PAST SURGICAL HISTORY: Past Surgical History  Procedure Laterality Date  . Appendectomy    . Tonsillectomy    . Cesarean section    . Tubal ligation    . Abdominal hysterectomy    . Back surgery    . Hernia repair      X 2  . Anal fissure repair    . Carpal tunnel release      LEFT   . Colonoscopy w/ polypectomy  12/02/2012  . Breast surgery      BIOPSY  Right Breast -  Sentinel Lymph Nodes  . Liver biopsy  03/28/11    [facility] - Cirrhosis  . Spinal fusion    . Esophagogastroduodenoscopy  12/02/2012    Gastritis  . Hand surgery Bilateral     FAMILY HISTORY Family History  Problem Relation Age of Onset  . Lung cancer Maternal Grandfather   . Colon cancer Paternal Grandfather   . Diabetes Mother   . Hypertension Mother   . Heart disease Maternal Uncle   . Diabetes Sister   . Heart disease Sister   . Thyroid disease Sister   . Heart disease Father   . Hypertension Father   . Diabetes Daughter   . Hypertension Other   . Heart disease Other    the patient's father died at the age of 55, following a fall. The patient's mother died at the age of 74. 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 The patient had no brothers, 3 sisters, one of whom is present at the initial visit. There is no history of breast or ovarian cancer in the family, but one niece was diagnosed with colon cancer at the age of 19. Her paternal grandfather died from colon cancer. Her maternal grandfather died from lung cancer.  GYNECOLOGIC HISTORY: She had menarche at age 23. She went through menopause approximately age 39. She took hormone replacement on total recently. She is GX P2, first pregnancy to term age 31.  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 Her husband of 40+ years, Daisy Larson, is a Theme park manager. He underwent heart transplant at [facility] at May 2011 and is doing  very well except for pain from costochondritis. Their children are Daisy L[place] who [place] in [place] and is a Geophysicist/field seismologist, and [name],  also living in [place], who works as a Physiological scientist. The patient has one grandchild. She attends an independent [employer]: in place  HEALTH MAINTENANCE: (Updated January 2015) History  Substance Use Topics  . Smoking status: Never Smoker   . Smokeless tobacco: Never Used  . Alcohol Use: No     Colonoscopy: 2014, Dr. Sharlett Iles  PAP: November 2014  Bone density: June 2013  at [facility], osteopenia with a T    score of -1.9  Lipid panel:  Not on file, Dr. Eulas Post  Allergies  Allergen Reactions  . Other Rash    Blisters Per Patient - she has been told she can not have general anesthesia "unless life-threatening"  . Tape   . Oxymorphone     lethargic  . Barbiturates Dermatitis and Other (See Comments)    Drowsiness, patient states that Barbiturates & Narcotics make her extremely sleepy and drowsy 24 Apr 2013 Drowsiness  "sleeps forever"    Current Outpatient Prescriptions  Medication Sig Dispense Refill  . aspirin 81 MG tablet Take 81 mg by mouth daily.    Marland Kitchen b complex vitamins capsule Take 1 capsule by mouth daily.     . B Complex-C (SUPER B COMPLEX PO) Take 1 tablet by mouth daily.    Marland Kitchen buPROPion (WELLBUTRIN XL) 300 MG 24 hr tablet Take 1 tablet by mouth Daily.    . calcium-vitamin D (OSCAL WITH D) 500-200 MG-UNIT per tablet Take 1 tablet by mouth daily.    . Cinnamon 500 MG capsule Take 500 mg by mouth daily.     . citalopram (CELEXA) 10 MG tablet 20 mg.     . DULoxetine (CYMBALTA) 60 MG capsule Take 60 mg by mouth daily.    . furosemide (LASIX) 40 MG tablet     . gabapentin (NEURONTIN) 800 MG tablet 800 mg 3 (three) times daily.     Marland Kitchen glucose blood test strip Use as instructed 1 each by Other route as needed for other. Use as instructed Dx: E11.65 300 each 1   . Grape Seed Extract 50 MG CAPS Take by mouth daily.     . hydrocortisone (ANUSOL-HC) 25 MG suppository Place 1 suppository (25 mg total) rectally 2 (two) times daily. 12 suppository 0  . hydrocortisone-pramoxine (ANALPRAM-HC) 2.5-1 % rectal cream Place rectally as needed for hemorrhoids. 30 g 0  . insulin regular human CONCENTRATED (HUMULIN R) 500 UNIT/ML SOLN injection Use three times daily with meals. Max dose 80 units per day. This is equivalent to 400 units of U-100 insulin per day. (Patient taking differently: Use three times daily with meals. Max dose 80 units per day. This is equivalent to 400 units of U-100 insulin per day. Pt takes medication per sliding scale.) 60 mL 3  . Insulin Syringe-Needle U-100 (B-D INS SYR ULTRAFINE 1CC/31G) 31G X 5/16" 1 ML MISC Use as Directed Three Times Daily. 300 each 1  . Insulin Syringe-Needle U-100 31G X 15/64" 0.3 ML MISC     . lactulose (CEPHULAC) 20 G packet Take 20 g by mouth 3 (three) times daily.    Marland Kitchen lidocaine-hydrocortisone (ANAMANTEL HC) 3-0.5 % CREA Use small application of cream 4 times daily for 2 weeks then as needed. 28.3 g 3  . lisinopril (PRINIVIL,ZESTRIL) 20 MG tablet Take 20 mg by mouth every morning. Take in the morning    . loratadine (CLARITIN) 10 MG tablet Take 10 mg by mouth daily.    . Magnesium Oxide 400 MG CAPS Take 400 mg by mouth daily.     . meclizine (ANTIVERT) 12.5 MG tablet Take 12.5 mg by mouth 3 (three) times daily as needed.    . Milk Thistle 500 MG CAPS Take 1,000 mg by mouth daily.     . MULTIPLE VITAMIN PO Take 1 tablet by mouth daily.    . Omega-3 Fatty Acids (EQL OMEGA 3 FISH OIL) 1400 MG CAPS Take 1  capsule by mouth daily.    . pantoprazole (PROTONIX) 40 MG tablet     . polyethylene glycol (MIRALAX / GLYCOLAX) packet Take 17 g by mouth daily.    . pramipexole (MIRAPEX) 1 MG tablet Take 2 mg by mouth at bedtime.     . propranolol (INDERAL LA) 60 MG 24 hr capsule Take 60 mg by mouth daily. Take at night    .  spironolactone (ALDACTONE) 50 MG tablet     . traMADol (ULTRAM) 50 MG tablet Take 50 mg by mouth every 6 (six) hours as needed for pain.    . vitamin B-12 (CYANOCOBALAMIN) 1000 MCG tablet Take 1,000 mcg by mouth daily.    Marland Kitchen anastrozole (ARIMIDEX) 1 MG tablet Take 1 tablet (1 mg total) by mouth daily. 90 tablet 3  . fluticasone (FLONASE) 50 MCG/ACT nasal spray Frequency:PRN   Dosage:50   MCG  Instructions:  Note:Dose: 50 MCG     No current facility-administered medications for this visit.    OBJECTIVE: Middle-aged white woman who appears tired and anxious  Filed Vitals:   01/13/15 1423  BP: 109/63  Pulse: 78  Temp: 97.5 F (36.4 C)  Resp: 19     Body mass index is 44.24 kg/(m^2).    ECOG FS: 2 Filed Weights   01/13/15 1423  Weight: 288 lb 14.4 oz (131.044 kg)   Physical Exam:  Skin: warm, dry  HEENT: sclerae anicteric, conjunctivae pink, oropharynx clear. No thrush or mucositis.  Lymph Nodes: No cervical or supraclavicular lymphadenopathy  Lungs: clear to auscultation bilaterally, no rales, wheezes, or rhonci  Heart: regular rate and rhythm  Abdomen: obsee, soft, non tender, positive bowel sounds  Musculoskeletal: No focal spinal tenderness, trace edema to bilateral lower extremities  Neuro: non focal, well oriented, positive affect  Breast: right breast status post lumpectomy and radiation. Tender to palpation. No evidence of recurrent disease. Right axilla benign. Left breast unremarkable.    LAB RESULTS: Lab Results  Component Value Date   WBC 7.0 01/06/2015   NEUTROABS 4.7 01/06/2015   HGB 14.0 01/06/2015   HCT 40.4 01/06/2015   MCV 97.8 01/06/2015   PLT 121* 01/06/2015      Chemistry      Component Value Date/Time   NA 137 01/06/2015 1355   NA 135* 09/22/2014   NA 139 11/04/2012 1020   K 4.8 01/06/2015 1355   K 4.5 09/22/2014   CL 81* 02/25/2013 1300   CL 103 11/04/2012 1020   CO2 24 01/06/2015 1355   CO2 29 11/04/2012 1020   BUN 19.0 01/06/2015 1355    BUN 18 09/22/2014   BUN 10 11/04/2012 1020   CREATININE 1.3* 01/06/2015 1355   CREATININE 1.4* 09/22/2014   CREATININE 0.8 11/04/2012 1020   GLU 360 09/22/2014      Component Value Date/Time   CALCIUM 9.8 01/06/2015 1355   CALCIUM 9.2 11/04/2012 1020   ALKPHOS 184* 01/06/2015 1355   ALKPHOS 152* 09/22/2014   AST 63* 01/06/2015 1355   AST 57* 09/22/2014   ALT 44 01/06/2015 1355   ALT 49* 09/22/2014   BILITOT 1.35* 01/06/2015 1355   BILITOT 1.1 11/04/2012 1020        STUDIES:  Most recent mamm[facility] at [facility] on 06/08/2014 was unremarkable.  Most recent bone density at [facility] on 06/08/2014 showed osteopenia, with T score of -2.1.    ASSESSMENT: 64 year [place]   (1) status post right lumpectomy 01/19/2012 for a T1c N0, stage IA invasive  mucinous carcinoma, grade 1, estrogen 99% and progesterone 100% receptor positive, with an MIB of 8, and no HER-2 amplification.  (2) completed radiation 04/01/2012  (3) on anastrozole since June 2013  (4)  iron deficiency anemia, s/p ferumoxytol x2 December 2013  (5) osteopenia, L fem neck T -1.9 on June 2013.  Receives zoledronic acid on an annual basis, first dose in February 2014   (6)  Multiple comorbitities including uncontrolled diabetes, cirrhosis, and hypertension.  PLAN:  As far as her breast cancer is concerned, Daisy Larson is doing well. She is now 2 years out from her definitive surgery with no evidence of recurrent disease. She is tolerating the anastrozole well and will continue this drug for 5 years of antiestrogen therapy. Of course she has multiple comorbidities that take "center stage" with regards to her health. She visits with her internal medicine physician regarding her liver next week.   Emberlin will proceed with her yearly dose of zolendronate at the completion of this visit. She will next be due for a bone density scan in August 2017. Her next mammogram will be due in August of this year. She will  return for labs, an office visit, and her next zolendronate dose next March. She understands and agrees with this plan. She knows the goal of treatment in her case is cure. She has been encouraged to call with any issues that might arise before her next visit here.    Laurie Panda, NP   01/13/2015

## 2015-01-13 NOTE — Patient Instructions (Signed)

## 2015-01-13 NOTE — Telephone Encounter (Signed)
Appointments made and avs printed for patient °

## 2015-01-14 ENCOUNTER — Encounter: Payer: Self-pay | Admitting: Physician Assistant

## 2015-01-14 ENCOUNTER — Ambulatory Visit (INDEPENDENT_AMBULATORY_CARE_PROVIDER_SITE_OTHER): Payer: BLUE CROSS/BLUE SHIELD | Admitting: Physician Assistant

## 2015-01-14 VITALS — BP 122/60 | HR 76 | Ht 67.75 in | Wt 285.4 lb

## 2015-01-14 DIAGNOSIS — K6289 Other specified diseases of anus and rectum: Secondary | ICD-10-CM

## 2015-01-14 DIAGNOSIS — K7469 Other cirrhosis of liver: Secondary | ICD-10-CM

## 2015-01-14 DIAGNOSIS — K625 Hemorrhage of anus and rectum: Secondary | ICD-10-CM

## 2015-01-14 MED ORDER — HYDROCORTISONE ACETATE 25 MG RE SUPP: RECTAL | Status: DC

## 2015-01-14 NOTE — Patient Instructions (Addendum)
Keep your appointment with Dr. Zenovia Jarred on 02-01-2015 at 2:45 PM.  You can get Recticare cream at [facility] Apply rectally  4-5 times daily for discomfort.  We have given you a coupon.   Continue the Anusol HC Suppositories at bedtime.   You have been scheduled for a flexible sigmoidoscopy. Please follow the written instructions given to you at your visit today. If you use inhalers (even only as needed), please bring them with you on the day of your procedure.

## 2015-01-14 NOTE — Progress Notes (Addendum)
Patient ID: JODA BRAATZ, female   DOB: [birth year], 64 y.o.   MRN: [id number]   Subjective:    Patient ID: Daisy Larson, female    DOB: [birth year], 64 y.o.   MRN: [id number]  HPI Daisy Larson is a pleasant 64 year old white female pre-her prior patient of Dr. Sharlett Iles spell established with Dr.Pyrtle who I saw in the office in early February 2016 for complaints of rectal bleeding and discomfort. Patient has history of right breast cancer, morbid obesity, adult-onset diabetes mellitus, hypertension, sleep apnea and decompensated cirrhosis of unclear etiology. She is followed at [facility] by Dr. Halford Chessman for hepatology. She had had chronic problems in the past with encephalopathy and is currently on Chronulac and Xifaxan. Last EGD was done in February 2014 no varices noted at that time Last colonoscopy February 2014 4 sessile polyps which were adenomatous and severe diverticulosis. Noted to have large external hemorrhoids. The time of last office visit it was felt that she had asymptomatic internal hemorrhoid on a she was tender to exam with a palpable edematous hemorrhoid. She was started on AnaMantle HC cream 3-4 times daily and asked to follow-up in a few weeks. She comes back in today stating that she's had continuous problems and really does not feel any better. She has ongoing intermittent rectal pressure and discomfort and intermittent bright red blood per rectum. She's usually sitting on a doughnut device to help with her discomfort. She had called yesterday was started on Anusol [facility] suppositories and says that seems to be helping. She denies any severe discomfort with bowel movements but rather complains of chest pressure.  Review of Systems Pertinent positive and negative review of systems were noted in the above HPI section.  All other review of systems was otherwise negative.  Outpatient Encounter Prescriptions as of 01/14/2015  Medication Sig  . anastrozole (ARIMIDEX) 1 MG tablet Take  1 tablet (1 mg total) by mouth daily.  Marland Kitchen aspirin 81 MG tablet Take 81 mg by mouth daily.  Marland Kitchen b complex vitamins capsule Take 1 capsule by mouth daily.   . B Complex-C (SUPER B COMPLEX PO) Take 1 tablet by mouth daily.  Marland Kitchen buPROPion (WELLBUTRIN XL) 300 MG 24 hr tablet Take 1 tablet by mouth Daily.  . calcium-vitamin D (OSCAL WITH D) 500-200 MG-UNIT per tablet Take 1 tablet by mouth daily.  . Cinnamon 500 MG capsule Take 500 mg by mouth daily.   . citalopram (CELEXA) 10 MG tablet 20 mg.   . DULoxetine (CYMBALTA) 60 MG capsule Take 60 mg by mouth daily.  . fluticasone (FLONASE) 50 MCG/ACT nasal spray Frequency:PRN   Dosage:50   MCG  Instructions:  Note:Dose: 50 MCG  . furosemide (LASIX) 40 MG tablet   . gabapentin (NEURONTIN) 800 MG tablet 800 mg 3 (three) times daily.   Marland Kitchen glucose blood test strip Use as instructed 1 each by Other route as needed for other. Use as instructed Dx: E11.65  . Grape Seed Extract 50 MG CAPS Take by mouth daily.   . hydrocortisone (ANUSOL-HC) 25 MG suppository Use 1 suppository rectally at bedtime.  . hydrocortisone-pramoxine (ANALPRAM-HC) 2.5-1 % rectal cream Place rectally as needed for hemorrhoids.  . insulin regular human CONCENTRATED (HUMULIN R) 500 UNIT/ML SOLN injection Use three times daily with meals. Max dose 80 units per day. This is equivalent to 400 units of U-100 insulin per day. (Patient taking differently: Use three times daily with meals. Max dose 80 units per day. This is equivalent to 400 units  of U-100 insulin per day. Pt takes medication per sliding scale.)  . Insulin Syringe-Needle U-100 (B-D INS SYR ULTRAFINE 1CC/31G) 31G X 5/16" 1 ML MISC Use as Directed Three Times Daily.  . Insulin Syringe-Needle U-100 31G X 15/64" 0.3 ML MISC   . lactulose (CEPHULAC) 20 G packet Take 20 g by mouth 3 (three) times daily.  Marland Kitchen lidocaine-hydrocortisone (ANAMANTEL HC) 3-0.5 % CREA Use small application of cream 4 times daily for 2 weeks then as needed.  Marland Kitchen lisinopril  (PRINIVIL,ZESTRIL) 20 MG tablet Take 20 mg by mouth every morning. Take in the morning  . loratadine (CLARITIN) 10 MG tablet Take 10 mg by mouth daily.  . Magnesium Oxide 400 MG CAPS Take 400 mg by mouth daily.   . meclizine (ANTIVERT) 12.5 MG tablet Take 12.5 mg by mouth 3 (three) times daily as needed.  . Milk Thistle 500 MG CAPS Take 1,000 mg by mouth daily.   . MULTIPLE VITAMIN PO Take 1 tablet by mouth daily.  . Omega-3 Fatty Acids (EQL OMEGA 3 FISH OIL) 1400 MG CAPS Take 1 capsule by mouth daily.  . pantoprazole (PROTONIX) 40 MG tablet   . polyethylene glycol (MIRALAX / GLYCOLAX) packet Take 17 g by mouth daily.  . pramipexole (MIRAPEX) 1 MG tablet Take 2 mg by mouth at bedtime.   . propranolol (INDERAL LA) 60 MG 24 hr capsule Take 60 mg by mouth daily. Take at night  . spironolactone (ALDACTONE) 50 MG tablet   . traMADol (ULTRAM) 50 MG tablet Take 50 mg by mouth every 6 (six) hours as needed for pain.  . vitamin B-12 (CYANOCOBALAMIN) 1000 MCG tablet Take 1,000 mcg by mouth daily.  . [DISCONTINUED] hydrocortisone (ANUSOL-HC) 25 MG suppository Place 1 suppository (25 mg total) rectally 2 (two) times daily.  . [DISCONTINUED] anastrozole (ARIMIDEX) 1 MG tablet Take 1 tablet (1 mg total) by mouth daily.   Allergies  Allergen Reactions  . Other Rash    Blisters Per Patient - she has been told she can not have general anesthesia "unless life-threatening"  . Tape   . Oxymorphone     lethargic  . Barbiturates Dermatitis and Other (See Comments)    Drowsiness, patient states that Barbiturates & Narcotics make her extremely sleepy and drowsy 24 Apr 2013 Drowsiness  "sleeps forever"   Patient Active Problem List   Diagnosis Date Noted  . Hx of Bell's palsy 08/27/2014  . Essential hypertension 08/27/2014  . History of cardiac dysrhythmia 08/27/2014  . Charcot's joint of right foot 08/27/2014  . Chronic hypercapnic respiratory failure 08/27/2014  . Long-term insulin use 08/27/2014  .  Depression, major, recurrent 08/27/2014  . Nontoxic multinodular goiter 06/24/2014  . Osteopenia 11/13/2013  . Thrombocytopenia, unspecified 05/12/2013  . Iron deficiency anemia 10/16/2012  . Encephalopathy acute 10/09/2012  . OSA (obstructive sleep apnea) 10/09/2012  . 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 Pica in adults 10/08/2012  . Anemia 10/08/2012  . Syncope 10/07/2012  . History of breast cancer in female 01/19/2012  . Cancer of upper-outer quadrant of female breast 01/09/2012  . Type 2 diabetes, uncontrolled, with neuropathy 12/27/2010  . RESTLESS LEGS SYNDROME 12/27/2010  . Diabetic polyneuropathy 12/27/2010  . DIABETES MELLITUS, TYPE II, UNCONTROLLED 09/14/2010  . HYPERLIPIDEMIA, MIXED, WITH HIGH HDL 09/14/2010  . Essential hypertension, benign 09/14/2010  . PERSONAL HISTORY OF COLONIC POLYPS 09/14/2010   History   Social History  . Marital Status: Married    Spouse Name: N/A  . Number of  Children: N/A  . Years of Education: N/A   Occupational History  . Retired     Social History Main Topics  . Smoking status: Never Smoker   . Smokeless tobacco: Never Used  . Alcohol Use: No  . Drug Use: No  . Sexual Activity: Not on file   Other Topics Concern  . Not on file   Social History Narrative   Former Chief Strategy Officer. Married to [place] who is a Theme park manager and who underwent a Heart Transplant at [facility] in May 2011.  2 children and 1 grandchild.      Daily caffeine     Ms. Sangalang's family history includes Colon cancer in her paternal grandfather; Diabetes in her daughter, mother, and sister; Heart disease in her father, maternal uncle, other, and sister; Hypertension in her father, mother, and other; Lung cancer in her maternal grandfather; Thyroid disease in her sister.      Objective:    Filed Vitals:   01/14/15 1357  BP: 122/60  Pulse: 76    Physical Exam  well-developed white female in no acute distress, pleasant blood pressure 122/60 pulse  76 height 5 foot 7 weight 285, BMI 43.7. HEENT nontraumatic normocephalic EOMI PERRLA sclera anicteric, Cardiovascular; regular rate and rhythm with S1-S2 no murmur or gallop, Pulmonary;clear bilaterally, Abdomen; obese soft nontender nondistended, Rectal ;exam multiple external hemorrhoidal tags she may have a small sentinel pile on digital exam she is tender with palpable fullness just inside the rectum and possible fissure, ext;no clubbing cyanosis or edema skin warm dry, Psych; mood and affect normal and appropriate       Assessment & Plan:   #1 64 yo female with decompensated cirrhosis with persistent rectal bleeding and rectal pressure-unresponsive to  treatment for internal hemorrhoids thus far. Because of hx of cirrhosis ,pt will require Flex to better visualize rectum and assure no rectal varices have developed before considering a banding procedure. Also can assess for fissure as  Exam difficult with multiple external hemorrhoids as well #2 AODM with neuropathy #3 adenomatous polyps- follow up colon 2019 #4 hepatic encephalopathy #5 hx breast CA #6 morbid obesity #7 sleep apnea  Plan; Continue Anusol hc supp q hs Add recticare 5% lidocaine 4-5 x daily  Schedule for Flex sigmoidoscopy with Dr Hilarie Fredrickson . Procedure discussed in deatil with pt and she is agreeable to proceed.   Amy S Esterwood PA-C 01/14/2015   Cc: Alroy Dust, L.Marlou Sa, MD  Addendum: Reviewed and agree with initial management. Jerene Bears, MD

## 2015-01-15 ENCOUNTER — Encounter: Payer: Self-pay | Admitting: *Deleted

## 2015-01-18 ENCOUNTER — Other Ambulatory Visit (INDEPENDENT_AMBULATORY_CARE_PROVIDER_SITE_OTHER): Payer: BLUE CROSS/BLUE SHIELD

## 2015-01-18 ENCOUNTER — Encounter: Payer: Self-pay | Admitting: Internal Medicine

## 2015-01-18 ENCOUNTER — Ambulatory Visit (AMBULATORY_SURGERY_CENTER): Payer: BLUE CROSS/BLUE SHIELD | Admitting: Internal Medicine

## 2015-01-18 ENCOUNTER — Other Ambulatory Visit: Payer: Self-pay

## 2015-01-18 VITALS — BP 119/67 | HR 63 | Temp 97.3°F | Resp 21

## 2015-01-18 DIAGNOSIS — K625 Hemorrhage of anus and rectum: Secondary | ICD-10-CM

## 2015-01-18 DIAGNOSIS — K7460 Unspecified cirrhosis of liver: Secondary | ICD-10-CM

## 2015-01-18 LAB — CBC
HEMATOCRIT: 35.7 % — AB (ref 36.0–46.0)
Hemoglobin: 12.3 g/dL (ref 12.0–15.0)
MCHC: 34.5 g/dL (ref 30.0–36.0)
MCV: 96.7 fl (ref 78.0–100.0)
Platelets: 96 10*3/uL — ABNORMAL LOW (ref 150.0–400.0)
RBC: 3.69 Mil/uL — ABNORMAL LOW (ref 3.87–5.11)
RDW: 14.1 % (ref 11.5–15.5)
WBC: 6.2 10*3/uL (ref 4.0–10.5)

## 2015-01-18 LAB — PROTIME-INR
INR: 1.1 ratio — ABNORMAL HIGH (ref 0.8–1.0)
PROTHROMBIN TIME: 12.2 s (ref 9.6–13.1)

## 2015-01-18 LAB — GLUCOSE, CAPILLARY
GLUCOSE-CAPILLARY: 120 mg/dL — AB (ref 70–99)
Glucose-Capillary: 47 mg/dL — ABNORMAL LOW (ref 70–99)
Glucose-Capillary: 69 mg/dL — ABNORMAL LOW (ref 70–99)

## 2015-01-18 MED ORDER — AMBULATORY NON FORMULARY MEDICATION: Status: DC

## 2015-01-18 MED ORDER — SODIUM CHLORIDE 0.9 % IV SOLN: 500.0000 mL | INTRAVENOUS | Status: DC

## 2015-01-18 MED ORDER — DEXTROSE 50 % IV SOLN
25.0000 mL | Freq: Once | INTRAVENOUS | Status: AC
  Administered 2015-01-18 (×2): 25 mL via INTRAVENOUS

## 2015-01-18 MED ORDER — DEXTROSE 5 % IV SOLN: INTRAVENOUS | Status: DC

## 2015-01-18 NOTE — Patient Instructions (Signed)
Discharge instructions given. Handout on diverticulosis. Office will send medication and put lab order. Resume previous medications. YOU HAD AN ENDOSCOPIC PROCEDURE TODAY AT [facility]:   Refer to the procedure report that was given to you for any specific questions about what was found during the examination.  If the procedure report does not answer your questions, please call your gastroenterologist to clarify.  If you requested that your care partner not be given the details of your procedure findings, then the procedure report has been included in a sealed envelope for you to review at your convenience later.  YOU SHOULD EXPECT: Some feelings of bloating in the abdomen. Passage of more gas than usual.  Walking can help get rid of the air that was put into your GI tract during the procedure and reduce the bloating. If you had a lower endoscopy (such as a colonoscopy or flexible sigmoidoscopy) you may notice spotting of blood in your stool or on the toilet paper. If you underwent a bowel prep for your procedure, you may not have a normal bowel movement for a few days.  Please Note:  You might notice some irritation and congestion in your nose or some drainage.  This is from the oxygen used during your procedure.  There is no need for concern and it should clear up in a day or so.  SYMPTOMS TO REPORT IMMEDIATELY:   Following lower endoscopy (colonoscopy or flexible sigmoidoscopy):  Excessive amounts of blood in the stool  Significant tenderness or worsening of abdominal pains  Swelling of the abdomen that is new, acute  Fever of 100F or higher   For urgent or emergent issues, a gastroenterologist can be reached at any hour by calling [phone].   DIET: Your first meal following the procedure should be a small meal and then it is ok to progress to your normal diet. Heavy or fried foods are harder to digest and may make you feel nauseous or bloated.  Likewise, meals  heavy in dairy and vegetables can increase bloating.  Drink plenty of fluids but you should avoid alcoholic beverages for 24 hours.  ACTIVITY:  You should plan to take it easy for the rest of today and you should NOT DRIVE or use heavy machinery until tomorrow (because of the sedation medicines used during the test).    FOLLOW UP: Our staff will call the number listed on your records the next business day following your procedure to check on you and address any questions or concerns that you may have regarding the information given to you following your procedure. If we do not reach you, we will leave a message.  However, if you are feeling well and you are not experiencing any problems, there is no need to return our call.  We will assume that you have returned to your regular daily activities without incident.  If any biopsies were taken you will be contacted by phone or by letter within the next 1-3 weeks.  Please call us at [phone] if you have not heard about the biopsies in 3 weeks.    SIGNATURES/CONFIDENTIALITY: You and/or your care partner have signed paperwork which will be entered into your electronic medical record.  These signatures attest to the fact that that the information above on your After Visit Summary has been reviewed and is understood.  Full responsibility of the confidentiality of this discharge information lies with you and/or your care-partner.

## 2015-01-18 NOTE — Progress Notes (Signed)
A/ox3, pleased with MAC, report to RN 

## 2015-01-18 NOTE — Op Note (Signed)
[facility] [facility]  [address] [place], [address]   FLEXIBLE SIGMOIDOSCOPY PROCEDURE REPORT  PATIENT: Daisy Larson, Daisy Larson  MR#: [id number] BIRTHDATE: 1951-10-29 , 34  yrs. old GENDER: female ENDOSCOPIST: Jerene Bears, MD PROCEDURE DATE:  01/18/2015 PROCEDURE:   Sigmoidoscopy, diagnostic ASA CLASS:   Class III INDICATIONS:rectal bleeding. MEDICATIONS: Monitored anesthesia care and Propofol 130 mg IV  DESCRIPTION OF PROCEDURE:   After the risks benefits and alternatives of the procedure were thoroughly explained, informed consent was obtained.  Digital exam revealed several skin tags, prolapsed internal hemorrhoid and external hemorrhoids. The LB 180 Scope [id number]  endoscope was introduced through the anus  and advanced to the sigmoid colon , The exam was Without limitations. The overall prep quality was adequate.   The instrument was then slowly withdrawn as the mucosa was fully examined.    COLON FINDINGS: There was moderate diverticulosis noted in the distal sigmoid colon.   The colon mucosa was otherwise normal.  No rectal varices were seen.  Retroflexed views revealed internal and external hemorrhoids.   The scope was then withdrawn from the patient and the procedure terminated.  COMPLICATIONS: There were no immediate complications.  ENDOSCOPIC IMPRESSION: 1.   Moderate diverticulosis was noted in the distal sigmoid colon 2.   The colon mucosa was otherwise normal 3.   Internal and external hemorrhoids.  No rectal varices seen.  RECOMMENDATIONS: 1.  Continue current medications 2.  Add nitroglycerin ointment 0.125% three times daily to anal canal x 6 weeks, then office follow-up 3.  Check CBC, INR today   eSigned:  Jerene Bears, MD 01/18/2015 8:27 AM   HO:[id number] Alroy Dust, MD The Patient

## 2015-01-19 ENCOUNTER — Telehealth: Payer: Self-pay | Admitting: *Deleted

## 2015-01-19 NOTE — Telephone Encounter (Signed)
  Follow up Call-  Call back number 01/18/2015 12/02/2012  Post procedure Call Back phone  # [phone] [place] PHONE.  Permission to leave phone message Yes Yes     Patient questions:  Do you have a fever, pain , or abdominal swelling? No. Pain Score  0 *  Have you tolerated food without any problems? Yes.    Have you been able to return to your normal activities? Yes.    Do you have any questions about your discharge instructions: Diet   No. Medications  No. Follow up visit  No.  Do you have questions or concerns about your Care? No.  Actions: * If pain score is 4 or above: No action needed, pain <4.

## 2015-01-20 NOTE — Telephone Encounter (Signed)
Error

## 2015-01-25 ENCOUNTER — Telehealth: Payer: Self-pay | Admitting: *Deleted

## 2015-01-25 NOTE — Telephone Encounter (Signed)
-----   Message from Jerene Bears, MD sent at 01/21/2015  6:04 PM EDT ----- She does not need this appt, but rather in 6 weeks in banding slot Cont NTG ointment until followup (given after recent flex) Thanks JMP  ----- Message -----    From: Larina Bras, CMA    Sent: 01/21/2015   1:56 PM      To: Jerene Bears, MD  Dr Hilarie Fredrickson- This patient is on your schedule for 02/01/15 for internal/external hemorrhoids and cirrhosis.... However, it appears she was seen by an extender on 01/14/15 for the same and has since had a flex sig.... Does she need to keep appointment with you?

## 2015-01-25 NOTE — Telephone Encounter (Signed)
Spoke to patient, she verbalizes understanding that we will cancel 02/01/15 appointment and instead schedule her for hemorrhoidal banding on May 5th @ 9:15 am.

## 2015-02-01 ENCOUNTER — Ambulatory Visit: Payer: BLUE CROSS/BLUE SHIELD | Admitting: Internal Medicine

## 2015-02-05 ENCOUNTER — Encounter: Payer: Self-pay | Admitting: *Deleted

## 2015-02-16 ENCOUNTER — Ambulatory Visit: Payer: BLUE CROSS/BLUE SHIELD | Admitting: Endocrinology

## 2015-02-18 ENCOUNTER — Ambulatory Visit (INDEPENDENT_AMBULATORY_CARE_PROVIDER_SITE_OTHER): Payer: BLUE CROSS/BLUE SHIELD | Admitting: Endocrinology

## 2015-02-18 ENCOUNTER — Encounter: Payer: Self-pay | Admitting: Endocrinology

## 2015-02-18 VITALS — BP 122/72 | HR 65 | Resp 16 | Ht 67.75 in | Wt 291.4 lb

## 2015-02-18 DIAGNOSIS — E1310 Other specified diabetes mellitus with ketoacidosis without coma: Secondary | ICD-10-CM

## 2015-02-18 DIAGNOSIS — E1142 Type 2 diabetes mellitus with diabetic polyneuropathy: Secondary | ICD-10-CM

## 2015-02-18 DIAGNOSIS — E1165 Type 2 diabetes mellitus with hyperglycemia: Secondary | ICD-10-CM

## 2015-02-18 DIAGNOSIS — IMO0002 Reserved for concepts with insufficient information to code with codable children: Secondary | ICD-10-CM

## 2015-02-18 DIAGNOSIS — E1110 Type 2 diabetes mellitus with ketoacidosis without coma: Secondary | ICD-10-CM

## 2015-02-18 DIAGNOSIS — I10 Essential (primary) hypertension: Secondary | ICD-10-CM | POA: Diagnosis not present

## 2015-02-18 DIAGNOSIS — E1140 Type 2 diabetes mellitus with diabetic neuropathy, unspecified: Secondary | ICD-10-CM | POA: Diagnosis not present

## 2015-02-18 LAB — HEMOGLOBIN A1C: HEMOGLOBIN A1C: 6.7 % — AB (ref 4.6–6.5)

## 2015-02-18 MED ORDER — GLUCOSE BLOOD VI STRP: ORAL_STRIP | Status: DC

## 2015-02-18 NOTE — Patient Instructions (Addendum)
Check sugars 4 x daily ( before each meal and at bedtime).  Record them in a log book and bring that/meter to next appointment.    Blood Glucose (mg/dL)  Breakfast  (Units U500 Insulin)  Lunch  (Units U500 Insulin)  Supper  (Units U500 Insulin)  Nighttime (Units U500 Insulin)   <70 Treat the low blood sugar.  Recheck blood glucose  in 15 mins. If sugar is more than 70, then take the number of units of insulin in the 70-90 row, if before a meal.   70-90  15  17   9   0  91-130 (Base Dose)  25  28  15   0   131-150  26  29  16   0   151-200  27  30  17   0   201-250  28  31  18  0   251-300  29  32  19 1   301-350  30  33  20 2   351-400  31  34  21  3   401-450  32  35  22 4   >450  33  36  23  5        Please come back for a follow-up appointment in 6 weeks

## 2015-02-18 NOTE — Progress Notes (Signed)
Pre visit review using our clinic review tool, if applicable. No additional management support is needed unless otherwise documented below in the visit note. 

## 2015-02-18 NOTE — Progress Notes (Signed)
REASON FOR VISIT- Daisy Larson is a 64 y.o.-year-old female, here for follow up management of Type 2 Diabetes Mellitus, uncontrolled, with complications ( diagnosed around 2003 , is now on U-500 insulin. Has complications of  Neuropathy, Stage 3 CKD).   Last seen by me Feb 2016.  * Metformin  taken off due to cirrhosis.  * Tried Victoza in the past, but requested to be taken off due to fear of developing cancer while on this medication.  * Was on Levemir and Novolog prior to switching to U-500. Requiring high doses of insulin due to round the clock lactulose use due to her liver condition.  * husband had stroke recently , so she has been stressed . Her with daughter today, who has been helping with her care They are watching over her eating habits more closely, and she has started to have better numbers  Currently taking  -Humulin U-500 at 23+1 scale with BF and 27+1 with lunch and 17+1 scale with supper. Since the last visit, the patient has tried to take the U500 at with her meals.  Breakfast is usually around 8 AM, lunch between 1-2  PM , and dinner between 6 to 8 PM.  Last few hemoglobin A1cs are as follows-  Lab Results  Component Value Date   HGBA1C 6.7* 02/18/2015   HGBA1C 9.1* 10/01/2014   HGBA1C 7.8* 06/23/2014     Patient checks her sugars 3-4 times daily with a  One touch verio glucometer. Hasn't used her new meter as yet. By meter  download  her sugars are-  PREMEAL Breakfast Lunch Dinner Bedtime Overall  Glucose range: 86-309 91-376 91-391 83-259   Mean/median:         Hypoglycemia-  Few recent lows end of day,lowest recently was 56.   she has hypoglycemia awareness.     Dietary Habits- Has been eating consistently and limiting carbs. Done better with diet. Exercise- Nothing scheduled.  Looking to go back on her exercise program with the bike at home-hasn't done that since last time. Weight- Weight has been  uptrending recently, but she has a lot of weight  fluctuations due to her leg edema for which diuretic doses are being adjusted. Wt Readings from Last 3 Encounters:  02/18/15 291 lb 6.4 oz (132.178 kg)  01/14/15 285 lb 6 oz (129.445 kg)  01/13/15 288 lb 14.4 oz (131.044 kg)    Diabetes Complications-  Nephropathy-- Yes, Stage 3  CKD, last BUN/creatinine- GFR 44 recently Lab Results  Component Value Date   BUN 19.0 01/06/2015   CREATININE 1.3* 01/06/2015    Lab Results  Component Value Date   GFR 72.93 11/04/2012   Lab Results  Component Value Date   MICRALBCREAT 0.3 10/01/2014     Retinopathy- No,  Gets regular eye exams-UTD May 2015.Has rapid development of cataracts in the past year, and left sided surgery done since last time. Due for repeat exam Neuropathy- Has numbness and tingling in her feet. known neuropathy and BK numbness B/L and [place]. Follows up with podiatry- sees new doctor now.  Is on gabapentin. Also, having some sensory changes in upper extremities related to her trigger fingers. Recently had surgery for this as well.  Associated history - No history of CAD or prior stroke. No hyperlipidemia. No hypothyroidism. her last set of lipids were-  Lab Results  Component Value Date   CHOL 173 10/01/2014   HDL 45.50 10/01/2014   LDLCALC 113* 10/01/2014   TRIG 72.0 10/01/2014  CHOLHDL 4 10/01/2014   her last TSH was  Lab Results  Component Value Date   TSH 1.57 10/01/2014   Denies weight loss. Has been feeling "hot" all the time and increased sweating. Diarrhea related to chronic lactulose use. Denies tremors, palpitations.   I have reviewed the patient's past medical history, medications and allergies.   HTN- BP well controlled today on current regimen. Regimen includes Lisinopril.   Non-toxic Multinodular Goiter-  The patient had a nodule FNAed last Fall 2014, and results were benign. reviewed records from [place].  Previously nodules were being monitored by Dr Chalmers Cater.  Left of midline thyroid nodule  FNA was benign ( 13 x 7 x 34mm) done at [facility], Nov 18th 2014   Last Thyroid US [place] [facility]: Nov 2015 Denies noticing any enlargement in region of thyroid , change in voice or lumps in neck. Reports occasional choking episodes and trouble swallowing.   Review of Systems- [ x ]  Complains of    [  ]  denies [ x ] Recent weight change [ x ]  Fatigue [  ] polydipsia [  ] polyuria [  ]  nocturia [  ]  vision difficulty [  ] chest pain [  ] shortness of breath [x  ] leg swelling [  ] cough [  ] nausea/vomiting [  ] diarrhea [x  ] constipation [  ] abdominal pain [  x]  tingling/numbness in extremities [ x ]  concern with feet ( wounds/sores)-calluses-visits podiatry     PHYSICAL EXAM- BP 122/72 mmHg  Pulse 65  Resp 16  Ht 5' 7.75" (1.721 m)  Wt 291 lb 6.4 oz (132.178 kg)  BMI 44.63 kg/m2  SpO2 99% Wt Readings from Last 3 Encounters:  02/18/15 291 lb 6.4 oz (132.178 kg)  01/14/15 285 lb 6 oz (129.445 kg)  01/13/15 288 lb 14.4 oz (131.044 kg)   Exam: deferred  ASSESSMENT/PLAN-     Problem List Items Addressed This Visit      Cardiovascular and Mediastinum   Essential hypertension    Filed Vitals:   10/01/14 1057  BP: 124/68  Pulse: 76   BP well controlled on current regimen. Continue.    Urine MA normal 09/2014            Endocrine   Type 2 diabetes, uncontrolled, with neuropathy - Primary    Last A1c far from goal. Sugars are starting to look better. Update A1c Encouraged her to continue eating better. Start using the new meter.  Discussed U500 pen vs vial use, and she is comfortable with continuing the vial.  Refills sent.   Discussed eating at consistent times and taking her U-500 prior to eating,-take insulin injections at 7:30 am, 1:30pm, and 6pm  Change U-500 to 25+1 scale with morning and 28+ 1 at mid day and 15+1 scale with supper.  Check sugars 3-4 times daily.  Notify if problems with high/low sugars especially after dose change done today.                 Relevant Medications   insulin regular human CONCENTRATED (HUMULIN R) 500 UNIT/ML SOLN injection   Other Relevant Orders   Hemoglobin A1c (Completed)     Nervous and Auditory   Diabetic polyneuropathy     Regular foot care. Patient  Follows up regularly with Podiatry. She is on gabapentin and therapy is being monitored by them.  Relevant Medications   insulin regular human CONCENTRATED (HUMULIN R) 500 UNIT/ML SOLN injection    Other Visit Diagnoses    DM (diabetes mellitus) type 2, uncontrolled, with ketoacidosis        Relevant Medications    glucose blood test strip    insulin regular human CONCENTRATED (HUMULIN R) 500 UNIT/ML SOLN injection         Daisy Larson 02/19/2015, 4:58 PM

## 2015-02-19 ENCOUNTER — Other Ambulatory Visit: Payer: Self-pay | Admitting: Endocrinology

## 2015-02-19 MED ORDER — INSULIN REGULAR HUMAN (CONC) 500 UNIT/ML ~~LOC~~ SOLN: SUBCUTANEOUS | Status: DC

## 2015-02-19 MED ORDER — ONETOUCH DELICA LANCETS 33G MISC: Status: AC

## 2015-02-19 MED ORDER — "INSULIN SYRINGE-NEEDLE U-100 31G X 5/16"" 0.5 ML MISC": Status: DC

## 2015-02-19 NOTE — Assessment & Plan Note (Signed)
Last A1c far from goal. Sugars are starting to look better. Update A1c Encouraged her to continue eating better. Start using the new meter.  Discussed U500 pen vs vial use, and she is comfortable with continuing the vial.  Refills sent.   Discussed eating at consistent times and taking her U-500 prior to eating,-take insulin injections at 7:30 am, 1:30pm, and 6pm  Change U-500 to 25+1 scale with morning and 28+ 1 at mid day and 15+1 scale with supper.  Check sugars 3-4 times daily.  Notify if problems with high/low sugars especially after dose change done today.

## 2015-02-19 NOTE — Assessment & Plan Note (Signed)
Filed Vitals:   10/01/14 1057  BP: 124/68  Pulse: 76   BP well controlled on current regimen. Continue.    Urine MA normal 09/2014

## 2015-02-19 NOTE — Assessment & Plan Note (Signed)
Regular foot care. Patient  Follows up regularly with Podiatry. She is on gabapentin and therapy is being monitored by them.

## 2015-03-04 ENCOUNTER — Encounter: Payer: Self-pay | Admitting: Internal Medicine

## 2015-03-04 ENCOUNTER — Ambulatory Visit (INDEPENDENT_AMBULATORY_CARE_PROVIDER_SITE_OTHER): Payer: BLUE CROSS/BLUE SHIELD | Admitting: Internal Medicine

## 2015-03-04 VITALS — BP 100/54 | HR 64 | Ht 67.75 in | Wt 283.1 lb

## 2015-03-04 DIAGNOSIS — K648 Other hemorrhoids: Secondary | ICD-10-CM | POA: Diagnosis not present

## 2015-03-04 MED ORDER — AMBULATORY NON FORMULARY MEDICATION: Status: AC

## 2015-03-04 NOTE — Progress Notes (Signed)
Patient ID: Daisy Larson, female   DOB: April 07, 1951, 64 y.o.   MRN: [id number]  Patient presents for consideration of hemorrhoidal banding due to hemorrhoidal prolapse, intermittent bleeding and history of hemorrhoids. She has a history of cirrhosis and was recently seen by [facility] liver clinic. She was happy with this appointment. Recent flexible sigmoidoscopy ruled out rectal varices. Internal and external hemorrhoids were noted. INR recently 1.1 After flexible sigmoidoscopy she was started on nitroglycerin 0.125% 3 times daily for anal fissure. She is use this intermittently but not consistently. Pain with passing stool has improved however   PROCEDURE NOTE: The patient presents with symptomatic grade 2-3  hemorrhoids, requesting rubber band ligation of her hemorrhoidal disease.  All risks, benefits and alternative forms of therapy were described and informed consent was obtained.   The anorectum was pre-medicated with 0.25% nitroglycerin ointment The decision was made to band the let lateral (LL) internal hemorrhoid, and the [place] was used to perform band ligation without complication.  Digital anorectal examination was then performed to assure proper positioning of the band, and to adjust the banded tissue as required.  The patient was discharged home without pain or other issues.  Dietary and behavioral recommendations were given and along with follow-up instructions.     The following adjunctive treatments were recommended: Continue nitroglycerin 0.125% 3 times daily to the anal canal until banding session #2 Continue lactulose and MiraLAX to avoid constipation   The patient will return in 2-4 weeks for follow-up and possible additional banding as required. No complications were encountered and the patient tolerated the procedure well.

## 2015-03-04 NOTE — Patient Instructions (Addendum)
You have been scheduled for your 2nd hemorrhoidal banding on 04/22/15 @ 4:00 pm with Dr Hilarie Fredrickson.  We have sent a prescription for nitroglycerin 0.125% gel to [facility]. You should apply a pea size amount to your rectum three times daily x 6-8 weeks.  [facility]'s information is below: Address: [address] [place], [place] [address]  [phone]   HEMORRHOID BANDING PROCEDURE    FOLLOW-UP CARE   1. The procedure you have had should have been relatively painless since the banding of the area involved does not have nerve endings and there is no pain sensation.  The rubber band cuts off the blood supply to the hemorrhoid and the band may fall off as soon as 48 hours after the banding (the band may occasionally be seen in the toilet bowl following a bowel movement). You may notice a temporary feeling of fullness in the rectum which should respond adequately to plain Tylenol or Motrin.  2. Following the banding, avoid strenuous exercise that evening and resume full activity the next day.  A sitz bath (soaking in a warm tub) or bidet is soothing, and can be useful for cleansing the area after bowel movements.     3. To avoid constipation, take two tablespoons of natural wheat bran, natural oat bran, flax, Benefiber or any over the counter fiber supplement and increase your water intake to 7-8 glasses daily.    4. Unless you have been prescribed anorectal medication, do not put anything inside your rectum for two weeks: No suppositories, enemas, fingers, etc.  5. Occasionally, you may have more bleeding than usual after the banding procedure.  This is often from the untreated hemorrhoids rather than the treated one.  Don't be concerned if there is a tablespoon or so of blood.  If there is more blood than this, lie flat with your bottom higher than your head and apply an ice pack to the area. If the bleeding does not stop within a half an hour or if you feel faint, call  our office at [phone] or go to the emergency room.  6. Problems are not common; however, if there is a substantial amount of bleeding, severe pain, chills, fever or difficulty passing urine (very rare) or other problems, you should call us at [phone] or report to the nearest emergency room.  7. Do not stay seated continuously for more than 2-3 hours for a day or two after the procedure.  Tighten your buttock muscles 10-15 times every two hours and take 10-15 deep breaths every 1-2 hours.  Do not spend more than a few minutes on the toilet if you cannot empty your bowel; instead re-visit the toilet at a later time.

## 2015-04-01 ENCOUNTER — Ambulatory Visit (INDEPENDENT_AMBULATORY_CARE_PROVIDER_SITE_OTHER): Payer: BLUE CROSS/BLUE SHIELD | Admitting: Endocrinology

## 2015-04-01 ENCOUNTER — Encounter: Payer: Self-pay | Admitting: Endocrinology

## 2015-04-01 VITALS — BP 122/64 | HR 82 | Ht 67.75 in | Wt 286.2 lb

## 2015-04-01 DIAGNOSIS — E1140 Type 2 diabetes mellitus with diabetic neuropathy, unspecified: Secondary | ICD-10-CM

## 2015-04-01 DIAGNOSIS — E1165 Type 2 diabetes mellitus with hyperglycemia: Secondary | ICD-10-CM

## 2015-04-01 DIAGNOSIS — I10 Essential (primary) hypertension: Secondary | ICD-10-CM | POA: Diagnosis not present

## 2015-04-01 DIAGNOSIS — IMO0002 Reserved for concepts with insufficient information to code with codable children: Secondary | ICD-10-CM

## 2015-04-01 MED ORDER — INSULIN REGULAR HUMAN (CONC) 500 UNIT/ML ~~LOC~~ SOLN: SUBCUTANEOUS | Status: DC

## 2015-04-01 NOTE — Progress Notes (Signed)
REASON FOR VISIT- Daisy Larson is a 64 y.o.-year-old female, here for follow up management of Type 2 Diabetes Mellitus, uncontrolled, with complications ( diagnosed around 2003 , is now on U-500 insulin. Has complications of  Neuropathy, Stage 3 CKD).   Last seen by me April 2016.  * Metformin  taken off due to cirrhosis.  * Tried Victoza in the past, but requested to be taken off due to fear of developing cancer while on this medication.  * Was on Levemir and Novolog prior to switching to U-500. Requiring high doses of insulin due to round the clock lactulose use due to her liver condition.  * husband had stroke recently , so she has been stressed . Her with daughter today, who has been helping with her care They are watching over her eating habits more closely, and she has started to have better numbers *lost 2 vials of insulin recently when she was in [place].   Currently taking  -Humulin U-500 at 25+1 scale with BF and 28+1 with lunch and 15+1 scale with supper. Since the last visit, the patient has tried to take the U500 at with her meals, but lately hasn't been able to keep up with this. Sometimes taking it after the meals or in between meals, when she remembers. Supper times shot sometimes at night time as well.   Breakfast is usually around 8 AM, lunch between 1-2  PM , and dinner between 6 to 8 PM.  Last few hemoglobin A1cs are as follows-  Lab Results  Component Value Date   HGBA1C 6.7* 02/18/2015   HGBA1C 9.1* 10/01/2014   HGBA1C 7.8* 06/23/2014     Patient checks her sugars 3-4 times daily with a  One touch verio glucometer. Hasn't used her new meter as yet. By meter  download  her sugars are-  PREMEAL Breakfast Lunch Dinner Bedtime Overall  Glucose range: 51-242 94-438 75-409 274-441   Mean/median:         Hypoglycemia-  Few recent lows mostly at start of day,lowest recently was 51.   she has hypoglycemia awareness.     Dietary Habits- Had been eating  consistently and limiting carbs. Now not doing so well.  Exercise- Nothing scheduled.  Looking to go back on her exercise program with the bike at home-hasn't done that since last time. Weight- Weight has been  uptrending recently, but she has a lot of weight fluctuations due to her leg edema for which diuretic doses are being adjusted. Wt Readings from Last 3 Encounters:  04/01/15 286 lb 4 oz (129.842 kg)  03/04/15 283 lb 2 oz (128.425 kg)  02/18/15 291 lb 6.4 oz (132.178 kg)    Diabetes Complications-  Nephropathy-- Yes, Stage 3  CKD, last BUN/creatinine- GFR 44 recently Lab Results  Component Value Date   BUN 19.0 01/06/2015   CREATININE 1.3* 01/06/2015    Lab Results  Component Value Date   GFR 72.93 11/04/2012   Lab Results  Component Value Date   MICRALBCREAT 0.3 10/01/2014     Retinopathy- No,  Gets regular eye exams-UTD May 2015.Has rapid development of cataracts in the past year, and left sided surgery done since last time. Due for repeat exam Neuropathy- Has numbness and tingling in her feet. known neuropathy and BK numbness B/L and [place]. Follows up with podiatry- sees new doctor now.  Is on gabapentin. Also, having some sensory changes in upper extremities related to her trigger fingers. Recently had surgery for this as well.  Associated history - No history of CAD or prior stroke. No hyperlipidemia. No hypothyroidism. her last set of lipids were-  Lab Results  Component Value Date   CHOL 173 10/01/2014   HDL 45.50 10/01/2014   LDLCALC 113* 10/01/2014   TRIG 72.0 10/01/2014   CHOLHDL 4 10/01/2014   her last TSH was  Lab Results  Component Value Date   TSH 1.57 10/01/2014   Denies weight loss. Has been feeling "hot" all the time and increased sweating. Diarrhea related to chronic lactulose use. Denies tremors, palpitations.   I have reviewed the patient's past medical history, medications and allergies.   HTN- BP well controlled today on current regimen.  Regimen includes Lisinopril.   Non-toxic Multinodular Goiter-  The patient had a nodule FNAed last Fall 2014, and results were benign. reviewed records from [place].  Previously nodules were being monitored by Dr Chalmers Cater.  Left of midline thyroid nodule FNA was benign ( 13 x 7 x 41mm) done at [facility], Nov 18th 2014   Last Thyroid US [place] imaging: Nov 2015 Denies noticing any enlargement in region of thyroid , change in voice or lumps in neck. Reports occasional choking episodes and trouble swallowing.   Review of Systems- [ x ]  Complains of    [  ]  denies [ x ] Recent weight change [ x ]  Fatigue [  ] polydipsia [  ] polyuria [  ]  nocturia [  ]  vision difficulty [  ] chest pain [ x ] shortness of breath [x  ] leg swelling [  ] cough [  ] nausea/vomiting [  ] diarrhea [  ] constipation [  ] abdominal pain [  x]  tingling/numbness in extremities [  ]  concern with feet ( wounds/sores)-calluses-visits podiatry     PHYSICAL EXAM- BP 122/64 mmHg  Pulse 82  Ht 5' 7.75" (1.721 m)  Wt 286 lb 4 oz (129.842 kg)  BMI 43.84 kg/m2  SpO2 97% Wt Readings from Last 3 Encounters:  04/01/15 286 lb 4 oz (129.842 kg)  03/04/15 283 lb 2 oz (128.425 kg)  02/18/15 291 lb 6.4 oz (132.178 kg)   Exam: deferred  ASSESSMENT/PLAN-     Problem List Items Addressed This Visit      Cardiovascular and Mediastinum   Essential hypertension, benign    Filed Vitals:   10/01/14 1057  BP: 124/68  Pulse: 76   BP well controlled on current regimen. Continue.    Urine MA normal 09/2014              Endocrine   Type 2 diabetes, uncontrolled, with neuropathy - Primary    Last A1c was excellent. Sugars were getting better, now with altered U500 injection and meal time schedule, there is lot more fluctuation. Also, some of these sugars are postprandial and after lactulose.  I have asked her to study the trend in her sugars were she to take them prior to lactulose or after lactulose.  She  will work on taking her insulin prior to meal time and not in between. Take pm shot at supper time and not at bedtime.  Notify me if she continues to have lows.  Given her the "free sample coupon" for Humulin 500 along with a printed script for insulin.  She will try to fill her insulin at the pharmacy.     Discussed eating at consistent times and taking her U-500 prior to eating,-take insulin injections at 7:30 am,  1:30pm, and 6pm  Continue U-500 to 25+1 scale with morning and 28+ 1 at mid day and 15+1 scale with supper.  Check sugars 3-4 times daily.                  Relevant Medications   insulin regular human CONCENTRATED (HUMULIN R) 500 UNIT/ML SOLN injection      RTC 6 weeks. She is going to decide about her follow up care, whether she wants to stay with [facility] or go to [facility]. Explained that I am transferring out of state.    Daisy Larson 04/01/2015, 2:44 PM

## 2015-04-01 NOTE — Progress Notes (Signed)
Pre visit review using our clinic review tool, if applicable. No additional management support is needed unless otherwise documented below in the visit note. 

## 2015-04-01 NOTE — Patient Instructions (Signed)
Check sugars 4 x daily ( before each meal and at bedtime).  Record them in a log book and bring that/meter to next appointment.   Take U500 per scale prior to meals. Dont take in between. Try taking lactulose prior to sugar checks and see where your sugars are after meals. Also, try taking your sugar check prior to lactulose and assess your post meal sugars.   ?Transferring to Dr Cruzita Lederer  Or Dr Dwyane Dee, or she is considering transfer to [facility].  Please come back for a follow-up appointment in 6 weeks

## 2015-04-01 NOTE — Assessment & Plan Note (Signed)
Filed Vitals:   10/01/14 1057  BP: 124/68  Pulse: 76   BP well controlled on current regimen. Continue.    Urine MA normal 09/2014

## 2015-04-01 NOTE — Assessment & Plan Note (Signed)
Last A1c was excellent. Sugars were getting better, now with altered U500 injection and meal time schedule, there is lot more fluctuation. Also, some of these sugars are postprandial and after lactulose.  I have asked her to study the trend in her sugars were she to take them prior to lactulose or after lactulose.  She will work on taking her insulin prior to meal time and not in between. Take pm shot at supper time and not at bedtime.  Notify me if she continues to have lows.  Given her the "free sample coupon" for Humulin 500 along with a printed script for insulin.  She will try to fill her insulin at the pharmacy.     Discussed eating at consistent times and taking her U-500 prior to eating,-take insulin injections at 7:30 am, 1:30pm, and 6pm  Continue U-500 to 25+1 scale with morning and 28+ 1 at mid day and 15+1 scale with supper.  Check sugars 3-4 times daily.

## 2015-04-05 ENCOUNTER — Telehealth: Payer: Self-pay

## 2015-04-05 NOTE — Telephone Encounter (Signed)
Spoke to pt, states she needs a Rx for the U500 pen #1 box sent to [facility] so that she can use the coupon. The Rx that was sent previously did not allow pt to use the coupon. I don't know how to send this in, thanks!

## 2015-04-05 NOTE — Telephone Encounter (Signed)
The pt called hoping to speak with the CMA regarding medication Dr.Phadke prescribed.  She not specifically say what the medication reaction was.

## 2015-04-05 NOTE — Telephone Encounter (Signed)
Please ask her to use the gold coupon that I gave her first, not the other one that brings down the copay.  If the gold coupon doesn't work, then will change to Pen form. thanks

## 2015-04-05 NOTE — Telephone Encounter (Signed)
Pt states she used the gold coupon and it doesn't work.

## 2015-04-06 ENCOUNTER — Other Ambulatory Visit: Payer: Self-pay | Admitting: *Deleted

## 2015-04-06 ENCOUNTER — Telehealth: Payer: Self-pay | Admitting: *Deleted

## 2015-04-06 DIAGNOSIS — IMO0002 Reserved for concepts with insufficient information to code with codable children: Secondary | ICD-10-CM

## 2015-04-06 DIAGNOSIS — E1165 Type 2 diabetes mellitus with hyperglycemia: Principal | ICD-10-CM

## 2015-04-06 DIAGNOSIS — E1140 Type 2 diabetes mellitus with diabetic neuropathy, unspecified: Secondary | ICD-10-CM

## 2015-04-06 MED ORDER — INSULIN REGULAR HUMAN (CONC) 500 UNIT/ML ~~LOC~~ SOLN: SUBCUTANEOUS | Status: DC

## 2015-04-06 NOTE — Telephone Encounter (Signed)
Left message for pt's daughter to return my call

## 2015-04-06 NOTE — Telephone Encounter (Signed)
Please could you find out the details regarding this call. I don't think we can get her the free vial of U500 since we dont have samples and the gold coupon doesn't work.

## 2015-04-06 NOTE — Telephone Encounter (Signed)
pts daughter Janace Hoard called requesting a return call from only Dr Howell Rucks in reference to pts medication.  Please return call

## 2015-04-07 ENCOUNTER — Encounter: Payer: Self-pay | Admitting: Gastroenterology

## 2015-04-07 ENCOUNTER — Telehealth: Payer: Self-pay | Admitting: Family Medicine

## 2015-04-07 MED ORDER — INSULIN REGULAR HUMAN (CONC) 500 UNIT/ML ~~LOC~~ SOLN: SUBCUTANEOUS | Status: DC

## 2015-04-07 NOTE — Telephone Encounter (Signed)
Per our discussion, looks like the patient got everything cleared with the insurance and she is able to get her insulin.

## 2015-04-07 NOTE — Telephone Encounter (Signed)
See other annotation

## 2015-04-07 NOTE — Telephone Encounter (Signed)
This has been handled, see other encounter.

## 2015-04-07 NOTE — Telephone Encounter (Signed)
Pt called to request a rx for Humulin R 500 units for 3 month supply. Please advise pt/msn

## 2015-04-07 NOTE — Telephone Encounter (Signed)
Spoke to daughter, states the pharmacy is telling her she needs a PA for the insulin. The pharmacy faxed a PA request but it was for the Humulin R u-100. I called patient's insurance, [facility], pt is due for a refill 04/13/15. I spoke to 4 different people, none of which could help me get the pt an override so she can get her insulin early. They continually handed me off to a different number until I was back to where I started. Dr. Howell Rucks was notified, she will call pharmacy to see what needs to be done. Pt's daughter notified of status, she states she has enough insulin until tomorrow.

## 2015-04-07 NOTE — Addendum Note (Signed)
Addended by: Wynonia Lawman E on: 04/07/2015 02:03 PM   Modules accepted: Orders

## 2015-04-07 NOTE — Telephone Encounter (Signed)
Pt's daughter called back, states pt was on the phone with her insurance for an hour, but they have given her a vial to hold her over until her next refill is due. Pt is requesting a 90 day supply, [facility].  [facility]. Rx sent to pharmacy by escript

## 2015-04-07 NOTE — Telephone Encounter (Signed)
Left message, requesting call back if she is still needing assistance from our office.

## 2015-04-22 ENCOUNTER — Encounter: Payer: Self-pay | Admitting: Internal Medicine

## 2015-04-22 ENCOUNTER — Ambulatory Visit (INDEPENDENT_AMBULATORY_CARE_PROVIDER_SITE_OTHER): Payer: BLUE CROSS/BLUE SHIELD | Admitting: Internal Medicine

## 2015-04-22 VITALS — BP 112/50 | HR 76 | Ht 67.75 in | Wt 289.1 lb

## 2015-04-22 DIAGNOSIS — K648 Other hemorrhoids: Secondary | ICD-10-CM | POA: Diagnosis not present

## 2015-04-22 NOTE — Progress Notes (Signed)
Patient ID: Daisy Larson, female   DOB: 1951/09/15, 64 y.o.   MRN: [id number] Patient presents for consideration of hemorrhoidal banding #2 due to hemorrhoidal prolapse, intermittent bleeding. Initial hemorrhoidal band placed on 03/04/2015. She did very well. She had minor rectal bleeding one day 24 hours after band placed but no further bleeding. Still some prolapse She used topical nitroglycerin several times since last visit but not on a regular basis. She denies pain with defecation.  PROCEDURE NOTE: The patient presents with symptomatic grade 2-3  hemorrhoids, requesting rubber band ligation of her hemorrhoidal disease.  All risks, benefits and alternative forms of therapy were described and informed consent was obtained.   The anorectum was pre-medicated with 0.125% nitroglycerin ointment The decision was made to band the RA (LL banded 03/04/15) internal hemorrhoid, and the Center was used to perform band ligation without complication.  Digital anorectal examination was then performed to assure proper positioning of the band, and to adjust the banded tissue as required.  The patient was discharged home without pain or other issues.  Dietary and behavioral recommendations were given and along with follow-up instructions.     The following adjunctive treatments were recommended: Continue lactulose and MiraLAX to avoid constipation, if loose stools would drop MiraLAX first Repeat banding appointment already scheduled for 05/17/2015 if necessary for third band placement  No complications were encountered and the patient tolerated the procedure well.

## 2015-04-22 NOTE — Patient Instructions (Signed)

## 2015-04-28 ENCOUNTER — Ambulatory Visit (INDEPENDENT_AMBULATORY_CARE_PROVIDER_SITE_OTHER): Payer: BLUE CROSS/BLUE SHIELD | Admitting: Endocrinology

## 2015-04-28 VITALS — BP 142/62 | HR 76 | Resp 16 | Ht 67.75 in | Wt 290.4 lb

## 2015-04-28 DIAGNOSIS — E1165 Type 2 diabetes mellitus with hyperglycemia: Secondary | ICD-10-CM

## 2015-04-28 DIAGNOSIS — E1140 Type 2 diabetes mellitus with diabetic neuropathy, unspecified: Secondary | ICD-10-CM | POA: Diagnosis not present

## 2015-04-28 DIAGNOSIS — IMO0002 Reserved for concepts with insufficient information to code with codable children: Secondary | ICD-10-CM

## 2015-04-28 DIAGNOSIS — I10 Essential (primary) hypertension: Secondary | ICD-10-CM

## 2015-04-28 LAB — HM DIABETES FOOT EXAM: HM Diabetic Foot Exam: ABNORMAL

## 2015-04-28 NOTE — Patient Instructions (Addendum)
DM shoes paperwork filled out  Follow up with Dr Dwyane Dee,

## 2015-04-28 NOTE — Progress Notes (Signed)
Pre visit review using our clinic review tool, if applicable. No additional management support is needed unless otherwise documented below in the visit note. 

## 2015-04-28 NOTE — Progress Notes (Signed)
REASON FOR VISIT- Daisy Larson is a 64 y.o.-year-old female, here for follow up management of Type 2 Diabetes Mellitus, uncontrolled, with complications ( diagnosed around 2003 , is now on U-500 insulin. Has complications of  Neuropathy, Stage 3 CKD).   Last seen by me June 2016. Early visit today to discuss DM foot wear paperwork. Recd staroid injection for plantar fascitiis 2 days ago  * Metformin  taken off due to cirrhosis.  * Tried Victoza in the past, but requested to be taken off due to fear of developing cancer while on this medication.  * Was on Levemir and Novolog prior to switching to U-500. Requiring high doses of insulin due to round the clock lactulose use due to her liver condition.  * husband had stroke recently , so she has been stressed . Her with daughter today, who has been helping with her care They are watching over her eating habits more closely, and she has started to have better numbers *lost 2 vials of insulin recently when she was in [place].   Currently taking  -Humulin U-500 at 25+1 scale with BF and 28+1 with lunch and 15+1 scale with supper. Since the last visit, the patient has tried to take the U500 at with her meals.   Breakfast is usually around 8 AM, lunch between 1-2  PM , and dinner between 6 to 8 PM.  Last few hemoglobin A1cs are as follows-  Lab Results  Component Value Date   HGBA1C 6.7* 02/18/2015   HGBA1C 9.1* 10/01/2014   HGBA1C 7.8* 06/23/2014     Patient checks her sugars 3-4 times daily with a  One touch verio glucometer. Hasn't used her new meter as yet. By meter  download  her sugars are-  PREMEAL Breakfast Lunch Dinner Bedtime Overall  Glucose range:     n/a  Mean/median:         Hypoglycemia-  Few recent lows ,lowest recently was n/a.   she has hypoglycemia awareness.     Dietary Habits- Had been eating consistently and limiting carbs. Now doing better.  Exercise- Nothing scheduled.  Looking to go back on her exercise  program with the bike at home-hasn't done that since last time. Weight- Weight has been  uptrending recently, but she has a lot of weight fluctuations due to her leg edema for which diuretic doses are being adjusted. Wt Readings from Last 3 Encounters:  04/28/15 290 lb 6.4 oz (131.725 kg)  04/22/15 289 lb 2 oz (131.146 kg)  04/01/15 286 lb 4 oz (129.842 kg)    Diabetes Complications-  Nephropathy-- Yes, Stage 3  CKD, last BUN/creatinine- GFR 44 recently Lab Results  Component Value Date   BUN 19.0 01/06/2015   CREATININE 1.3* 01/06/2015    Lab Results  Component Value Date   GFR 72.93 11/04/2012   Lab Results  Component Value Date   MICRALBCREAT 0.3 10/01/2014     Retinopathy- No,  Gets regular eye exams-UTD May 2015.Has rapid development of cataracts in the past year, and left sided surgery done since last time. Due for repeat exam Neuropathy- Has numbness and tingling in her feet. known neuropathy and BK numbness B/L and [place]. Follows up with podiatry- sees new doctor now.  Is on gabapentin. Also, having some sensory changes in upper extremities related to her trigger fingers. Recently had surgery for this as well.  Associated history - No history of CAD or prior stroke. No hyperlipidemia. No hypothyroidism. her last set of lipids  were-  Lab Results  Component Value Date   CHOL 173 10/01/2014   HDL 45.50 10/01/2014   LDLCALC 113* 10/01/2014   TRIG 72.0 10/01/2014   CHOLHDL 4 10/01/2014   her last TSH was  Lab Results  Component Value Date   TSH 1.57 10/01/2014   Denies weight loss. Has been feeling "hot" all the time and increased sweating. Diarrhea related to chronic lactulose use. Denies tremors, palpitations.   I have reviewed the patient's past medical history, medications and allergies.   HTN- BP well controlled today on current regimen. Regimen includes Lisinopril.   Non-toxic Multinodular Goiter-  The patient had a nodule FNAed last Fall 2014, and results  were benign. reviewed records from [place].  Previously nodules were being monitored by Dr Chalmers Cater.  Left of midline thyroid nodule FNA was benign ( 13 x 7 x 56mm) done at [facility], Nov 18th 2014   Last Thyroid US [place] imaging: Nov 2015 Denies noticing any enlargement in region of thyroid , change in voice or lumps in neck. Reports occasional choking episodes and trouble swallowing.    PHYSICAL EXAM- BP 142/62 mmHg  Pulse 76  Resp 16  Ht 5' 7.75" (1.721 m)  Wt 290 lb 6.4 oz (131.725 kg)  BMI 44.47 kg/m2  SpO2 98% Wt Readings from Last 3 Encounters:  04/28/15 290 lb 6.4 oz (131.725 kg)  04/22/15 289 lb 2 oz (131.146 kg)  04/01/15 286 lb 4 oz (129.842 kg)   GENERAL: No acute distress, well developed EXTREMITIES:     There is 1+edema. 2+ DP pulses  NEUROLOGICAL:     Grossly intact other than below.            Diabetic foot exam done with shoes and socks removed: absent Normal Monofilament testing bilaterally ( can feel at few places on left sole). +deformities of toes.  Nails dystrophic. Skin normal color.  Dry skin. Preulcerative callus right medial foot, with calluses bilaterally as marked on paperwork ( see scanned)    ASSESSMENT/PLAN-     Problem List Items Addressed This Visit      Cardiovascular and Mediastinum   Essential hypertension - Primary    Filed Vitals:   10/01/14 1057  BP: 124/68  Pulse: 76   BP usually well controlled on current regimen. Continue.    Urine MA normal 09/2014                Endocrine   Type 2 diabetes, uncontrolled, with neuropathy    Unable to make any changes to insulin as no sugar data available.  Last A1c well controlled. She was reminded to bring her sugar log and meter to next appointment.  Paperwork filled out for DM footwear. The patient will take these to the [place] office.         RTC next month.  Explained that I am transferring out of state and she has follow up visit set up with my colleagues at [facility].     Daisy Larson 04/30/2015, 3:22 PM

## 2015-04-30 NOTE — Assessment & Plan Note (Signed)
Unable to make any changes to insulin as no sugar data available.  Last A1c well controlled. She was reminded to bring her sugar log and meter to next appointment.  Paperwork filled out for DM footwear. The patient will take these to the [place] office.

## 2015-04-30 NOTE — Assessment & Plan Note (Signed)
Filed Vitals:   10/01/14 1057  BP: 124/68  Pulse: 76   BP usually well controlled on current regimen. Continue.    Urine MA normal 09/2014

## 2015-05-04 ENCOUNTER — Encounter: Payer: Self-pay | Admitting: Endocrinology

## 2015-05-04 ENCOUNTER — Other Ambulatory Visit: Payer: Self-pay | Admitting: *Deleted

## 2015-05-04 ENCOUNTER — Telehealth: Payer: Self-pay | Admitting: Endocrinology

## 2015-05-04 ENCOUNTER — Ambulatory Visit (INDEPENDENT_AMBULATORY_CARE_PROVIDER_SITE_OTHER): Payer: BLUE CROSS/BLUE SHIELD | Admitting: Endocrinology

## 2015-05-04 VITALS — BP 126/68 | HR 72 | Temp 98.3°F | Resp 16 | Ht 67.75 in | Wt 291.6 lb

## 2015-05-04 DIAGNOSIS — E1165 Type 2 diabetes mellitus with hyperglycemia: Secondary | ICD-10-CM

## 2015-05-04 DIAGNOSIS — IMO0002 Reserved for concepts with insufficient information to code with codable children: Secondary | ICD-10-CM

## 2015-05-04 DIAGNOSIS — G629 Polyneuropathy, unspecified: Secondary | ICD-10-CM

## 2015-05-04 DIAGNOSIS — T383X5A Adverse effect of insulin and oral hypoglycemic [antidiabetic] drugs, initial encounter: Secondary | ICD-10-CM | POA: Diagnosis not present

## 2015-05-04 DIAGNOSIS — E160 Drug-induced hypoglycemia without coma: Secondary | ICD-10-CM

## 2015-05-04 DIAGNOSIS — E1142 Type 2 diabetes mellitus with diabetic polyneuropathy: Secondary | ICD-10-CM

## 2015-05-04 MED ORDER — GLUCAGON (RDNA) 1 MG IJ KIT: 1.0000 mg | PACK | Freq: Once | INTRAMUSCULAR | Status: AC | PRN

## 2015-05-04 NOTE — Progress Notes (Addendum)
Patient ID: Daisy Larson, female   DOB: [birth year], 64 y.o.   MRN: [id number]   Reason for Appointment: Type II Diabetes and recent low blood sugars   History of Present Illness   DIAGNOSIS:  Type 2 Diabetes Mellitus, uncontrolled, with complications ( diagnosed around 2003).   Previous history: She had been on various oral hypoglycemic drugs at onset and because of poor control was switched to insulin about 2-1/2 years ago * Metformin  taken off due to cirrhosis.  * Tried Victoza in the past, but requested to be taken off due to fear of developing cancer while on this medication. Initially was taking premixed insulin without adequate control, detailed history not available  Recent history:  Insulin regimen: Humulin U-500 at meals 3 times a day: 23-27-17 units as base dose + extra insulin up to 8 units based on blood sugar level  She has been taking U-500 insulin with the syringe for about a year. Was on Levemir and Novolog prior to switching to U-500. Overall control has been relatively better with last A1c 6.7, previously higher She was seen last month by her endocrinologist but no changes made in her insulin as she did not have her monitor for download  at 25+1 scale with BF and 28+1 with lunch and 15+1 scale with supper has tried to take the U-500 at with her meals  Patient was seen urgently today because of episode of severe hypoglycemia last night at about 2 AM At that time her husband found her in the bathroom and she was not responding well However he managed to bring her blood sugar up from baseline level of 29 with 3 glasses of juice which she was able to drink  Current blood sugar patterns and problems identified:  She has had overnight hypoglycemia twice in the last 2 weeks, once on 7/2 and another last night  She has had hypoglycemia overnight and fasting as above but only once had low blood sugar in the afternoon on 7/2   Her fasting blood sugars have  been markedly variable over the last 2 weeks.  She has had low blood sugars waking up in the morning at least twice last month   She did have a steroid injection for plantar fasciitis sometime last month and this may have caused blood sugars over 400   Although her blood sugars are generally higher in the last week of June the started coming down on 6/30   She generally has progressively higher blood sugars in the afternoon and evening with readings averaging over 250 at all the time slots   She does not check any blood sugars after supper although checks the blood sugars at different times of the day and occasionally early morning      Oral hypoglycemic drugs:  none        Side effects from medications: None         Proper timing of medications in relation to meals: Yes.          Monitors blood glucose: Once a day.    Glucometer: One Touch.          Blood Glucose readings from meter download:  Mean values apply above for all meters except median for One Touch  PRE-MEAL Fasting Lunch Dinner Bedtime Overall  Glucose range:  50-335   185-434   185-491    29-601  Mean/median:      249  Meals: 3 meals per day.  Breakfast is usually around 8 AM, lunch between 1-2  PM , and dinner between 6 to 8 PM.        Physical activity: exercise: Unable to do any           Weight control:  Wt Readings from Last 3 Encounters:  05/04/15 291 lb 9.6 oz (132.269 kg)  04/28/15 290 lb 6.4 oz (131.725 kg)  04/22/15 289 lb 2 oz (131.146 kg)          Complications:     Diabetes labs:  Lab Results  Component Value Date   HGBA1C 6.7* 02/18/2015   HGBA1C 9.1* 10/01/2014   HGBA1C 7.8* 06/23/2014   Lab Results  Component Value Date   MICROALBUR 0.3 10/01/2014   LDLCALC 113* 10/01/2014   CREATININE 1.3* 01/06/2015       Medication List       This list is accurate as of: 05/04/15  9:25 PM.  Always use your most recent med list.               AMBULATORY NON FORMULARY MEDICATION    Medication Name: Nitroglycerin ointment 0.125% Apply pea-size amount internally 4 times daily     anastrozole 1 MG tablet  Commonly known as:  ARIMIDEX  Take 1 tablet (1 mg total) by mouth daily.     aspirin 81 MG tablet  Take 81 mg by mouth daily.     b complex vitamins capsule  Take 1 capsule by mouth daily.     buPROPion 300 MG 24 hr tablet  Commonly known as:  WELLBUTRIN XL  Take 1 tablet by mouth Daily.     calcium carbonate 600 MG Tabs tablet  Commonly known as:  OS-CAL  Take 1,200 mg by mouth.     Cinnamon 500 MG capsule  Take 500 mg by mouth daily.     citalopram 20 MG tablet  Commonly known as:  CELEXA  Take 1 tablet by mouth daily.     DULoxetine 60 MG capsule  Commonly known as:  CYMBALTA  Take 60 mg by mouth daily.     EQL OMEGA 3 FISH OIL 1400 MG Caps  Take 1 capsule by mouth daily.     fluticasone 50 MCG/ACT nasal spray  Commonly known as:  FLONASE  Frequency:PRN   Dosage:50   MCG  Instructions:  Note:Dose: 50 MCG     furosemide 40 MG tablet  Commonly known as:  LASIX  Take 20-40 mg by mouth daily as needed.     gabapentin 800 MG tablet  Commonly known as:  NEURONTIN  800 mg 3 (three) times daily.     GENADUR Liqd  Apply 1 application topically daily.     glucagon 1 MG injection  Commonly known as:  GLUCAGON EMERGENCY  Inject 1 mg into the vein once as needed.     glucose blood test strip  Use as instructed 1 each by Other route as needed for other. Use as instructed Dx: E11.65     Grape Seed Extract 50 MG Caps  Take by mouth daily.     hydrocortisone 25 MG suppository  Commonly known as:  ANUSOL-HC  Use 1 suppository rectally at bedtime.     hydrocortisone-pramoxine 2.5-1 % rectal cream  Commonly known as:  ANALPRAM HC  Place rectally as needed for hemorrhoids.     insulin regular human CONCENTRATED 500 UNIT/ML injection  Commonly known as:  HUMULIN R  Use three  times daily with meals per sliding scale. Max dose 80 units per day.  This is equivalent to 400 units of U-100 insulin per day.     Insulin Syringe-Needle U-100 31G X 5/16" 0.5 ML Misc  Commonly known as:  B-D INS SYRINGE 0.5CC/31GX5/16  Use for insulin administration three times daily     JUBLIA 10 % Soln  Generic drug:  Efinaconazole  Apply 1 application topically daily.     lactulose 20 G packet  Commonly known as:  CEPHULAC  Take 20 g by mouth 3 (three) times daily.     lidocaine-hydrocortisone 3-0.5 % Crea  Commonly known as:  ANAMANTEL HC  Use small application of cream 4 times daily for 2 weeks then as needed.     lisinopril 20 MG tablet  Commonly known as:  PRINIVIL,ZESTRIL  Take 20 mg by mouth every morning. Take in the morning     loratadine 10 MG tablet  Commonly known as:  CLARITIN  Take 10 mg by mouth daily.     Magnesium Oxide 400 MG Caps  Take 400 mg by mouth daily.     meclizine 12.5 MG tablet  Commonly known as:  ANTIVERT  Take 12.5 mg by mouth 3 (three) times daily as needed.     Milk Thistle 500 MG Caps  Take 1,000 mg by mouth daily.     MULTIPLE VITAMIN PO  Take 1 tablet by mouth daily.     ONETOUCH DELICA LANCETS 54U Misc  Use to test sugars 4 x daily.     pantoprazole 40 MG tablet  Commonly known as:  PROTONIX  Take 40 mg by mouth at bedtime.     polyethylene glycol packet  Commonly known as:  MIRALAX / GLYCOLAX  Take 17 g by mouth daily.     pramipexole 1 MG tablet  Commonly known as:  MIRAPEX  Take 2 mg by mouth at bedtime.     propranolol ER 60 MG 24 hr capsule  Commonly known as:  INDERAL LA  Take 60 mg by mouth daily. Take at night     spironolactone 50 MG tablet  Commonly known as:  ALDACTONE  Take 50 mg by mouth 2 (two) times daily.     traMADol 50 MG tablet  Commonly known as:  ULTRAM  Take 50 mg by mouth every 6 (six) hours as needed for pain.     XIFAXAN 550 MG Tabs tablet  Generic drug:  rifaximin  Take 1 tablet by mouth 2 (two) times daily.        Allergies:  Allergies    Allergen Reactions  . Other Rash    Blisters Per Patient - she has been told she can not have general anesthesia "unless life-threatening"  . Tape   . Oxymorphone     lethargic  . Barbiturates Dermatitis and Other (See Comments)    Drowsiness, patient states that Barbiturates & Narcotics make her extremely sleepy and drowsy 24 Apr 2013 Drowsiness  "sleeps forever"    Past Medical History  Diagnosis Date  . Cirrhosis   . Multiple thyroid nodules   . History of stomach ulcers   . Hernia   . Colon polyps 12/02/2012    TUBULAR ADENOMAS (X2) and Hyperplastic (X1)  . Neuropathy of foot   . Restless legs   . Complication of anesthesia     WITH SECON HERNIA REPAIR 2012 [facility]  DIFFICULTY O2 SAT DROPPING ADMIT TO HOSPITAL   . Bundle branch block   .  Hypertension   . Angina     COSTOCONDRITIS    . Shortness of breath     WITH EXERTION   . Diabetes mellitus   . Hepatitis      Did Not have hepatitis but needed Hep A and Hep B injections2012 SPOTS ON LIVER  DR Alonza Bogus  [phone]  . GERD (gastroesophageal reflux disease)     ULCERS  . Hyperthyroidism     NODULES ON THYROID  (DR BALEN 37  12-609)  . Goiter   . Headache(784.0)     MIGRAINES  . Neuromuscular disorder     PERIPH NEUROPATHY   . Anxiety   . Depression   . Breast cancer 01/19/12     right breast lumpectomy/ER/PR positibe,her-2 neg.  . Allergy     tape  . S/P radiation therapy 02/19/12 - 04/01/12    Right Breast: 5000 cgy/25 Fractions with Boost/ 1000 cGy/5 Fractions  . Iron deficiency anemia 10/16/2012  . Gastritis   . External hemorrhoids   . Diverticulosis   . Sleep apnea   . Bell's palsy 2014    twice  . Heart murmur   . Breast cancer 2011  . Short of breath on exertion     Past Surgical History  Procedure Laterality Date  . Appendectomy    . Tonsillectomy    . Cesarean section    . Tubal ligation    . Abdominal hysterectomy    . Back surgery    . Hernia repair      X 2  . Anal fissure  repair    . Carpal tunnel release      LEFT   . Colonoscopy w/ polypectomy  12/02/2012  . Breast surgery      BIOPSY  Right Breast -  Sentinel Lymph Nodes  . Liver biopsy  03/28/11    [facility] - Cirrhosis  . Spinal fusion    . Esophagogastroduodenoscopy  12/02/2012    Gastritis  . Hand surgery Bilateral     Family History  Problem Relation Age of Onset  . Lung cancer Maternal Grandfather   . Colon cancer Paternal Grandfather   . Diabetes Mother   . Hypertension Mother   . Heart disease Maternal Uncle   . Diabetes Sister   . Heart disease Sister   . Thyroid disease Sister   . Heart disease Father   . Hypertension Father   . Diabetes Daughter   . Hypertension Other   . Heart disease Other     Social History:  reports that she has never smoked. She has never used smokeless tobacco. She reports that she does not drink alcohol or use illicit drugs.  Review of Systems:  Edema legs has been a problem, recently controlled with diuretics including Lasix and Aldactone  Cirrhosis of liver, long-standing and followed at [facility].  Has significant hepatic insufficiency and is taking lactulose Also on milk thistle  Hypertension:  treated with lisinopril 20 mg  Has history of allergic rhinitis.  Lipids:   Lab Results  Component Value Date   CHOL 173 10/01/2014   HDL 45.50 10/01/2014   LDLCALC 113* 10/01/2014   TRIG 72.0 10/01/2014   CHOLHDL 4 10/01/2014   NEUROPATHY: Has numbness in his legs extending up to the middle of the lower leg Symptoms controlled with gabapentin and Cymbalta   Last foot exam in 6/16 showed the following:  absent Normal Monofilament testing bilaterally ( can feel at few places on left sole). +deformities  of toes. Nails dystrophic. Skin normal color. Dry skin. Preulcerative callus right medial foot, with calluses bilaterally  Reportedly also has Charcot foot She is requesting a wheelchair today because of difficulty  ambulation  Positive for history of sleep apnea  Complains of significant fatigue  History of depression, taking Celexa and Cymbalta  History of reflux treated with Protonix  Past history of vertigo  Review of Systems  Constitutional: Negative for weight loss.  Eyes: Negative for blurred vision.  Respiratory: Negative for shortness of breath.   Cardiovascular: Positive for leg swelling.  Gastrointestinal: Negative for abdominal pain.  Genitourinary: Negative for dysuria.  Neurological: Positive for tingling.  Endo/Heme/Allergies: Negative for polydipsia.  Psychiatric/Behavioral: Positive for depression.       Examination:   BP 126/68 mmHg  Pulse 72  Temp(Src) 98.3 F (36.8 C)  Resp 16  Ht 5' 7.75" (1.721 m)  Wt 291 lb 9.6 oz (132.269 kg)  BMI 44.66 kg/m2  SpO2 98%  Body mass index is 44.66 kg/(m^2).   She is alert She has 1+ pedal edema  ASSESSMENT/ PLAN:    Diabetes type 2:  Blood glucose control Has been very erratic over the last 2-3 weeks Generally the patient has had a tendency to low blood sugars in the mornings and overnight Generally has moderately high blood sugars in the afternoon and evenings before lunch and supper with average blood sugar generally in the 300 range See history of present illness for detailed discussion of his current management, blood sugar patterns and problems identified  Most of her variability in blood sugars related to her taking the U-500 insulin and also adjusting the dose based on the blood sugar level and not on the meal size.  She sometimes does not eat lunch at all and mealtimes are variable  She may have had some higher blood sugars last month from getting steroids for plantar fasciitis Not clear if her insulin metabolism is affected by her cirrhosis but likely has tendency to fasting hypoglycemia because of decreased gluconeogenesis Also probably getting excessive insulin at suppertime since she has tendency to lower  readings overnight  Recently had severe episode of hypoglycemia overnight; discussed with the husband that he should be able to give Glucagon if this occurs again especially if it is difficult to have her take oral glucose.  Explained to him how this is used   For now the patient will need to reduce her insulin significantly at suppertime and increase the doses at breakfast and lunch  She will reduce her insulin at lunch time if not eating more than a small snack  Insulin doses were explained to the patient and written instructions given  She will take extra insulin only when blood sugars are over 150 with additional unit for every 50 mg or so not exceeding 6 units total extra insulin  Also emphasized the need to try and take the insulin 30 minutes before eating which she is not doing  Discussed onset and duration of action of U-500 insulin  Discussed when to check the blood sugars and to check sugars after supper and bedtime to help adjust her evening dose also  Will review her blood sugars by phone on Friday again  Have consistent bedtime snack  Since she has a large supply of the U-500 insulin will continue her regimen as such currently but would favor changing her regimen to [place] or [place] along with mealtime Humalog U-200 insulin or an insulin pump for better control  She  may be also a candidate for using the V-go pump with U-500 insulin for simplicity   Patient Instructions  Take insulin 30 min before eating if possible  BASE DOSES: 26 AT BREAKFAST; 32 at lunch if eating but 20 if not eating and 10 at supper  Check some sugars at bedtime also  Call readings on Friday     Counseling time on subjects discussed above is over 50% of today's 40 minute visit   Tyriq Moragne 05/04/2015, 9:25 PM

## 2015-05-04 NOTE — Telephone Encounter (Signed)
error 

## 2015-05-04 NOTE — Patient Instructions (Signed)
Take insulin 30 min before eating if possible  BASE DOSES: 26 AT BREAKFAST; 32 at lunch if eating but 20 if not eating and 10 at supper  Check some sugars at bedtime also  Call readings on Friday

## 2015-05-05 ENCOUNTER — Encounter: Payer: Self-pay | Admitting: Endocrinology

## 2015-05-13 ENCOUNTER — Ambulatory Visit: Payer: BLUE CROSS/BLUE SHIELD | Admitting: Endocrinology

## 2015-05-17 ENCOUNTER — Encounter: Payer: Self-pay | Admitting: Internal Medicine

## 2015-05-17 ENCOUNTER — Ambulatory Visit (INDEPENDENT_AMBULATORY_CARE_PROVIDER_SITE_OTHER): Payer: BLUE CROSS/BLUE SHIELD | Admitting: Internal Medicine

## 2015-05-17 VITALS — BP 110/58 | HR 104 | Ht 67.75 in | Wt 287.5 lb

## 2015-05-17 DIAGNOSIS — K642 Third degree hemorrhoids: Secondary | ICD-10-CM

## 2015-05-17 DIAGNOSIS — K648 Other hemorrhoids: Secondary | ICD-10-CM | POA: Diagnosis not present

## 2015-05-17 NOTE — Patient Instructions (Signed)

## 2015-05-17 NOTE — Progress Notes (Signed)
Patient ID: Daisy Larson, female   DOB: 1950/12/28, 64 y.o.   MRN: [id number]  Patient presents for consideration of hemorrhoidal banding #3 due to hemorrhoidal prolapse with intermittent bleeding Initial band placed on 03/04/2015 followed by band #2 on 04/21/2014. She did very well after first and second band placement. Much reduction in prolapse and resolution of bleeding  PROCEDURE NOTE: The patient presents with symptomatic grade 2-3 internal hemorrhoids, requesting rubber band ligation of her hemorrhoidal disease.  All risks, benefits and alternative forms of therapy were described and informed consent was obtained.   The anorectum was pre-medicated with 0.125% after glycerin ointment The decision was made to band the right posterior internal hemorrhoid (left lateral and right anterior previously banded), and the Metolius was used to perform band ligation without complication.  Digital anorectal examination was then performed to assure proper positioning of the band, and to adjust the banded tissue as required.  The patient was discharged home without pain or other issues.  Dietary and behavioral recommendations were given and along with follow-up instructions.       The patient will return in as needed for recurrent prolapse or rectal bleeding symptoms No complications were encountered and the patient tolerated the procedure well.

## 2015-05-28 ENCOUNTER — Telehealth: Payer: Self-pay | Admitting: Oncology

## 2015-05-28 NOTE — Telephone Encounter (Signed)
Left message to confirm appointment change from 03/16 to 03/23

## 2015-06-10 ENCOUNTER — Encounter: Payer: Self-pay | Admitting: Endocrinology

## 2015-06-10 ENCOUNTER — Ambulatory Visit: Payer: BLUE CROSS/BLUE SHIELD | Admitting: Endocrinology

## 2015-06-10 ENCOUNTER — Ambulatory Visit (INDEPENDENT_AMBULATORY_CARE_PROVIDER_SITE_OTHER): Payer: Medicare Other | Admitting: Endocrinology

## 2015-06-10 ENCOUNTER — Other Ambulatory Visit: Payer: Self-pay | Admitting: *Deleted

## 2015-06-10 VITALS — BP 94/60 | HR 67 | Temp 97.7°F | Resp 16 | Ht 63.75 in | Wt 297.0 lb

## 2015-06-10 DIAGNOSIS — IMO0002 Reserved for concepts with insufficient information to code with codable children: Secondary | ICD-10-CM

## 2015-06-10 DIAGNOSIS — E1165 Type 2 diabetes mellitus with hyperglycemia: Secondary | ICD-10-CM

## 2015-06-10 DIAGNOSIS — I10 Essential (primary) hypertension: Secondary | ICD-10-CM

## 2015-06-10 LAB — COMPREHENSIVE METABOLIC PANEL
ALBUMIN: 3.2 g/dL — AB (ref 3.5–5.2)
ALK PHOS: 186 U/L — AB (ref 39–117)
ALT: 31 U/L (ref 0–35)
AST: 41 U/L — ABNORMAL HIGH (ref 0–37)
BUN: 19 mg/dL (ref 6–23)
CALCIUM: 9.8 mg/dL (ref 8.4–10.5)
CO2: 30 mEq/L (ref 19–32)
Chloride: 102 mEq/L (ref 96–112)
Creatinine, Ser: 1 mg/dL (ref 0.40–1.20)
GFR: 59.14 mL/min — ABNORMAL LOW (ref >60.00)
Glucose, Bld: 164 mg/dL — ABNORMAL HIGH (ref 70–99)
Potassium: 4.3 mEq/L (ref 3.5–5.1)
Sodium: 138 mEq/L (ref 135–145)
TOTAL PROTEIN: 6.9 g/dL (ref 6.0–8.3)
Total Bilirubin: 1.1 mg/dL (ref 0.2–1.2)

## 2015-06-10 LAB — HEMOGLOBIN A1C: HEMOGLOBIN A1C: 6.5 % (ref 4.6–6.5)

## 2015-06-10 NOTE — Patient Instructions (Addendum)
Stay with same doses but base dose will be 12 at dinner Take the insulin 30 min before meals  Check blood sugars on waking up .Marland Kitchen 5-6 .Marland Kitchen times a week Also check blood sugars about 2 hours after a meal and do this after different meals by rotation  Recommended blood sugar levels on waking up is 90-130 and about 2 hours after meal is 140-180 Please bring blood sugar monitor to each visit.

## 2015-06-10 NOTE — Progress Notes (Signed)
Patient ID: Daisy Larson, female   DOB: 1951/08/15, 64 y.o.   MRN: [id number]   Reason for Appointment: Type II Diabetes and follow-up of recent changes  History of Present Illness   DIAGNOSIS:  Type 2 Diabetes Mellitus, uncontrolled, with complications ( diagnosed around 2003).   Previous history: She had been on various oral hypoglycemic drugs at onset and because of poor control was switched to insulin about 2-1/2 years ago * Metformin  taken off due to cirrhosis.  * Tried Victoza in the past, but requested to be taken off due to fear of developing cancer while on this medication. Initially was taking premixed insulin without adequate control, detailed history not available Was on Levemir and Novolog prior to switching to U-500.  Recent history:   Insulin regimen: Humulin U-500 at meals 3 times a day: BASE DOSES: 26 AT BREAKFAST; 32 at lunch if eating but 20 if not eating and 10 at supper Correction doses: 151-200 = +1   201-250 =+2    251-300 =+4    301-350 =+6    351+   =+8   She has been taking U-500 insulin with the syringe for about a year.  Overall control has been relatively better with using this 3 times a day  On her initial visit here she was having excessive overnight hypoglycemia and was taking her insulin based only on pre-meal blood sugar She had relatively significant hypoglycemic episode in the night with glucose 29 She would also adjust the dose based on the level of blood sugar with a complicated correction dosage chart given by previous endocrinologist  Her insulin dose was changed to reduce her coverage at suppertime only 10 units of the U-500 and she was given alternatives doses at lunchtime to take depending on whether she would eat lunch or not She has been following the instructions fairly well but did not bring her blood sugar monitor for download today; not clear what her blood sugar patterns are  Current blood sugar patterns  and problems identified:  She has had some low sugars in the mornings waking up until last week but this week has not had any low sugars in the mornings; this was after her hospitalization for worsening liver function  In fact her a.m. sugars are now mostly near-normal, today 212  Lunchtime blood sugars are usually in the low 100s but occasionally up to 200  She thinks her bedtime sugars may be still over 200 recently  Although she has been told to take her insulin 30 minute before eating she is not doing this consistently   She has not had any extreme higher blood sugars as she was having before     Oral hypoglycemic drugs:  none        Side effects from medications: None         Proper timing of medications in relation to meals: Yes.          Monitors blood glucose: about 3 times a day    Glucometer: One Touch.          Blood Glucose readings from recall are as above         Meals: 3 meals per day.  Breakfast is usually around 8 AM, lunch between 1-2  PM , and dinner between 6 to 8 PM.        Physical activity: exercise: Unable to do any           Weight control:  Wt Readings from Last 3 Encounters:  06/10/15 297 lb (134.718 kg)  05/17/15 287 lb 8 oz (130.409 kg)  05/04/15 291 lb 9.6 oz (132.269 kg)           Diabetes labs:  Lab Results  Component Value Date   HGBA1C 6.7* 02/18/2015   HGBA1C 9.1* 10/01/2014   HGBA1C 7.8* 06/23/2014   Lab Results  Component Value Date   MICROALBUR 0.3 10/01/2014   LDLCALC 113* 10/01/2014   CREATININE 1.3* 01/06/2015       Medication List       This list is accurate as of: 06/10/15 11:26 AM.  Always use your most recent med list.               AMBULATORY NON FORMULARY MEDICATION  Medication Name: Nitroglycerin ointment 0.125% Apply pea-size amount internally 4 times daily     anastrozole 1 MG tablet  Commonly known as:  ARIMIDEX  Take 1 tablet (1 mg total) by mouth daily.     aspirin 81 MG tablet  Take 81 mg by mouth  daily.     b complex vitamins capsule  Take 1 capsule by mouth daily.     buPROPion 300 MG 24 hr tablet  Commonly known as:  WELLBUTRIN XL  Take 1 tablet by mouth Daily.     calcium carbonate 600 MG Tabs tablet  Commonly known as:  OS-CAL  Take 1,200 mg by mouth.     Cinnamon 500 MG capsule  Take 500 mg by mouth daily.     citalopram 20 MG tablet  Commonly known as:  CELEXA  Take 1 tablet by mouth daily.     DULoxetine 30 MG capsule  Commonly known as:  CYMBALTA  Take 30 mg by mouth daily.     DULoxetine 60 MG capsule  Commonly known as:  CYMBALTA  Take 60 mg by mouth daily.     EQL OMEGA 3 FISH OIL 1400 MG Caps  Take 1 capsule by mouth daily.     fluticasone 50 MCG/ACT nasal spray  Commonly known as:  FLONASE  Frequency:PRN   Dosage:50   MCG  Instructions:  Note:Dose: 50 MCG     furosemide 40 MG tablet  Commonly known as:  LASIX  Take 20 mg by mouth 2 (two) times daily.     gabapentin 800 MG tablet  Commonly known as:  NEURONTIN  800 mg 3 (three) times daily.     GENADUR Liqd  Apply 1 application topically daily.     glucagon 1 MG injection  Commonly known as:  GLUCAGON EMERGENCY  Inject 1 mg into the vein once as needed.     glucose blood test strip  Use as instructed 1 each by Other route as needed for other. Use as instructed Dx: E11.65     Grape Seed Extract 50 MG Caps  Take by mouth daily.     hydrocortisone 25 MG suppository  Commonly known as:  ANUSOL-HC  Use 1 suppository rectally at bedtime.     hydrocortisone-pramoxine 2.5-1 % rectal cream  Commonly known as:  ANALPRAM HC  Place rectally as needed for hemorrhoids.     insulin regular human CONCENTRATED 500 UNIT/ML injection  Commonly known as:  HUMULIN R  Use three times daily with meals per sliding scale. Max dose 80 units per day. This is equivalent to 400 units of U-100 insulin per day.     Insulin Syringe-Needle U-100 31G X 5/16" 0.5 ML Misc  Commonly known as:  B-D INS SYRINGE  0.5CC/31GX5/16  Use for insulin administration three times daily     JUBLIA 10 % Soln  Generic drug:  Efinaconazole  Apply 1 application topically daily.     lactulose 20 G packet  Commonly known as:  CEPHULAC  Take 20 g by mouth 3 (three) times daily.     lidocaine-hydrocortisone 3-0.5 % Crea  Commonly known as:  ANAMANTEL HC  Use small application of cream 4 times daily for 2 weeks then as needed.     lisinopril 20 MG tablet  Commonly known as:  PRINIVIL,ZESTRIL  Take 20 mg by mouth every morning. Take in the morning     loratadine 10 MG tablet  Commonly known as:  CLARITIN  Take 10 mg by mouth daily.     Magnesium Oxide 400 MG Caps  Take 400 mg by mouth daily.     meclizine 12.5 MG tablet  Commonly known as:  ANTIVERT  Take 12.5 mg by mouth 3 (three) times daily as needed.     Milk Thistle 500 MG Caps  Take 1,000 mg by mouth daily.     MULTIPLE VITAMIN PO  Take 1 tablet by mouth daily.     ONETOUCH DELICA LANCETS 63Z Misc  Use to test sugars 4 x daily.     pantoprazole 40 MG tablet  Commonly known as:  PROTONIX  Take 40 mg by mouth at bedtime.     polyethylene glycol packet  Commonly known as:  MIRALAX / GLYCOLAX  Take 17 g by mouth daily.     pramipexole 1 MG tablet  Commonly known as:  MIRAPEX  Take 2 mg by mouth at bedtime.     propranolol ER 60 MG 24 hr capsule  Commonly known as:  INDERAL LA  Take 60 mg by mouth daily. Take at night     spironolactone 50 MG tablet  Commonly known as:  ALDACTONE  Take 50 mg by mouth 2 (two) times daily.     traMADol 50 MG tablet  Commonly known as:  ULTRAM  Take 50 mg by mouth every 6 (six) hours as needed for pain.     XIFAXAN 550 MG Tabs tablet  Generic drug:  rifaximin  Take 1 tablet by mouth 2 (two) times daily.        Allergies:  Allergies  Allergen Reactions  . Other Rash    Blisters Per Patient - she has been told she can not have general anesthesia "unless life-threatening"  . Tape   .  Oxymorphone     lethargic  . Barbiturates Dermatitis and Other (See Comments)    Drowsiness, patient states that Barbiturates & Narcotics make her extremely sleepy and drowsy 24 Apr 2013 Drowsiness  "sleeps forever"    Past Medical History  Diagnosis Date  . Cirrhosis   . Multiple thyroid nodules   . History of stomach ulcers   . Hernia   . Colon polyps 12/02/2012    TUBULAR ADENOMAS (X2) and Hyperplastic (X1)  . Neuropathy of foot   . Restless legs   . Complication of anesthesia     WITH SECON HERNIA REPAIR 2012 [facility]  DIFFICULTY O2 SAT DROPPING ADMIT TO HOSPITAL   . Bundle branch block   . Hypertension   . Angina     COSTOCONDRITIS    . Shortness of breath     WITH EXERTION   . Diabetes mellitus   . Hepatitis  Did Not have hepatitis but needed Hep A and Hep B injections2012 SPOTS ON LIVER  DR Alonza Bogus  [id number]  . GERD (gastroesophageal reflux disease)     ULCERS  . Hyperthyroidism     NODULES ON THYROID  (DR BALEN 37  12-609)  . Goiter   . Headache(784.0)     MIGRAINES  . Neuromuscular disorder     PERIPH NEUROPATHY   . Anxiety   . Depression   . Breast cancer 01/19/12     right breast lumpectomy/ER/PR positibe,her-2 neg.  . Allergy     tape  . S/P radiation therapy 02/19/12 - 04/01/12    Right Breast: 5000 cgy/25 Fractions with Boost/ 1000 cGy/5 Fractions  . Iron deficiency anemia 10/16/2012  . Gastritis   . External hemorrhoids   . Diverticulosis   . Sleep apnea   . Bell's palsy 2014    twice  . Heart murmur   . Breast cancer 2011  . Short of breath on exertion     Past Surgical History  Procedure Laterality Date  . Appendectomy    . Tonsillectomy    . Cesarean section    . Tubal ligation    . Abdominal hysterectomy    . Back surgery    . Hernia repair      X 2  . Anal fissure repair    . Carpal tunnel release      LEFT   . Colonoscopy w/ polypectomy  12/02/2012  . Breast surgery      BIOPSY  Right Breast -  Sentinel Lymph Nodes    . Liver biopsy  03/28/11    [facility] - Cirrhosis  . Spinal fusion    . Esophagogastroduodenoscopy  12/02/2012    Gastritis  . Hand surgery Bilateral     Family History  Problem Relation Age of Onset  . Lung cancer Maternal Grandfather   . Colon cancer Paternal Grandfather   . Diabetes Mother   . Hypertension Mother   . Heart disease Maternal Uncle   . Diabetes Sister   . Heart disease Sister   . Thyroid disease Sister   . Heart disease Father   . Hypertension Father   . Diabetes Daughter   . Hypertension Other   . Heart disease Other     Social History:  reports that she has never smoked. She has never used smokeless tobacco. She reports that she does not drink alcohol or use illicit drugs.  Review of Systems:  Edema legs has been a problem, recently controlled with diuretics including Lasix and Aldactone  Cirrhosis of liver, long-standing and followed at [facility].  Has significant hepatic insufficiency and is taking lactulose Also on milk thistle  Hypertension:  treated with lisinopril 20 mg   Lipids:   Lab Results  Component Value Date   CHOL 173 10/01/2014   HDL 45.50 10/01/2014   LDLCALC 113* 10/01/2014   TRIG 72.0 10/01/2014   CHOLHDL 4 10/01/2014   NEUROPATHY: Has numbness in his legs extending up to the middle of the lower leg Symptoms controlled with gabapentin and Cymbalta   Last foot exam in 6/16 showed the following:  absent Normal Monofilament testing bilaterally ( can feel at few places on left sole). +deformities of toes. Nails dystrophic. Skin normal color. Dry skin. Preulcerative callus right medial foot, with calluses bilaterally  Reportedly also has Charcot foot   ROS     Examination:   BP 94/60 mmHg  Pulse 67  Temp(Src)  97.7 F (36.5 C)  Resp 16  Ht 5' 3.75" (1.619 m)  Wt 297 lb (134.718 kg)  BMI 51.40 kg/m2  SpO2 96%  Body mass index is 51.4 kg/(m^2).     ASSESSMENT/ PLAN:    Diabetes type 2:  Blood  glucose control difficult to assess as she did not bring her blood sugar monitor for download today See history of present illness for detailed discussion of his current management, blood sugar patterns and problems identified More recently her blood sugars are relatively better except mostly high at bedtime with current use of 10 units of U-500 insulin at suppertime She has had intercurrent medical problems and variable hepatic function which has affected her blood sugar control and action of insulin Recommendations:  Have consistent bedtime snack  Increase suppertime dose to 30 minutes  Take insulin consistently 30 minutes before eating  To call when her blood sugar is extremely high or low consistently  Needs education from diabetes educator and also review meal planning  Consider switching her insulin when her U-500 insulin at home is almost finished  Check A1c today  Discussed when to check her blood sugar  Since she has a large supply of the U-500 insulin will continue her regimen as such currently but would favor changing her regimen to [place] or [place] along with mealtime Humalog U-200 insulin or an insulin pump for better control  She may be also a candidate for using the V-go pump with U-500 insulin for simplicity  Counseling time on subjects discussed above is over 50% of today's 25 minute visit  Patient Instructions  Stay with same doses but base dose will be 12 at dinner Take the insulin 30 min before meals  Check blood sugars on waking up .Marland Kitchen 5-6 .Marland Kitchen times a week Also check blood sugars about 2 hours after a meal and do this after different meals by rotation  Recommended blood sugar levels on waking up is 90-130 and about 2 hours after meal is 140-180 Please bring blood sugar monitor to each visit.       Daisy Larson 06/10/2015, 11:26 AM   Addendum: A1c 6.5, electrolytes normal

## 2015-06-10 NOTE — Progress Notes (Signed)
Quick Note:  Please let patient know that the A1c is improved at 6.5, potassium normal, liver tests slightly better  ______

## 2015-06-15 ENCOUNTER — Ambulatory Visit: Payer: PRIVATE HEALTH INSURANCE | Admitting: Nutrition

## 2015-06-23 ENCOUNTER — Encounter: Payer: Medicare Other | Attending: Endocrinology | Admitting: Nutrition

## 2015-06-23 DIAGNOSIS — E1142 Type 2 diabetes mellitus with diabetic polyneuropathy: Secondary | ICD-10-CM | POA: Diagnosis not present

## 2015-06-23 DIAGNOSIS — Z713 Dietary counseling and surveillance: Secondary | ICD-10-CM | POA: Diagnosis not present

## 2015-06-23 NOTE — Patient Instructions (Signed)
Test blood sugars at bedtime.  If less than 140, eat 2 Saltine crackers with peanut butter

## 2015-06-23 NOTE — Progress Notes (Signed)
Pt. Did not bring meter.  Says FBSs are around 149, acL: 149-175, acS: 180-200 Wheel chair bound,   Typical day: 6AM up drinks only water 9AM: breakfast of 2 eggs with bacon, or egg and cheese sandwich, or grits and cheese, with water to drink 1-2PM  Some days eats no lunch, and other days eats soup or sandwich with water to drink 6-8PM; Meat 4-6 ounces, 3 veg. (one starchy), with water to drink.  No bread Rare snacking at HS, occasional crackers with peanut butter Denies sweets  Low blood sugars.  Has had 2 since seeing Dr. Dwyane Dee at [facility] - Kmi:  55, 60.    Plan: Test blood sugar at HS.  IF less than 140, have 2 crackers with peanut butter  She was shown the V-go and we discussed how it works and how to wear it.  She is interested, and filled out a benefit paper that I faxed over to them.  She will call me if she can afford this to try for 5 days.  She had no final questions.

## 2015-06-23 NOTE — Progress Notes (Deleted)
Subjective:     Patient ID: Daisy Larson, female   DOB: [birth year], 64 y.o.   MRN: [id number]  HPI   Review of Systems     Objective:   Physical Exam     Assessment:     ***    Plan:     ***

## 2015-06-28 ENCOUNTER — Telehealth: Payer: Self-pay | Admitting: Endocrinology

## 2015-06-28 NOTE — Telephone Encounter (Signed)
Pt husband needs to know what was said in the meeting with the pt and the nurse, there is some confusion regarding the [id number]

## 2015-06-28 NOTE — Telephone Encounter (Signed)
Daisy Larson,  Please see below and if you can, please call her husband to explain.  Thanks,  Suanne Marker

## 2015-06-28 NOTE — Telephone Encounter (Signed)
Daisy Larson had questions about the V-go device his wife brought home.  She could not remember what the device was or what to do with this.  I explained as much as could about this device and told him I would send him a brochure in the mail for him to look at.  He will then discuss this with Dr. Dwyane Dee at next visit, in 2 weeks.  He also said that he got a call from [employer], and they told him that he will have to pay a "250 dollar deduct able", and he can not afford this. He was not very clear about whether this was a monthly expense, or just the deduct able that he needs to pay.

## 2015-06-29 ENCOUNTER — Other Ambulatory Visit: Payer: Self-pay | Admitting: *Deleted

## 2015-06-29 ENCOUNTER — Telehealth: Payer: Self-pay | Admitting: Endocrinology

## 2015-06-29 DIAGNOSIS — E1110 Type 2 diabetes mellitus with ketoacidosis without coma: Secondary | ICD-10-CM

## 2015-06-29 MED ORDER — GLUCOSE BLOOD VI STRP: ORAL_STRIP | Status: DC

## 2015-06-29 NOTE — Telephone Encounter (Signed)
Pt needs refills test strips for 4 times a day , please call into [facility] in [place]

## 2015-07-06 ENCOUNTER — Other Ambulatory Visit: Payer: Self-pay | Admitting: *Deleted

## 2015-07-15 ENCOUNTER — Encounter: Payer: Self-pay | Admitting: Endocrinology

## 2015-07-15 ENCOUNTER — Ambulatory Visit (INDEPENDENT_AMBULATORY_CARE_PROVIDER_SITE_OTHER): Payer: Medicare Other | Admitting: Endocrinology

## 2015-07-15 VITALS — BP 124/66 | HR 68 | Temp 98.5°F | Resp 16 | Ht 63.75 in

## 2015-07-15 DIAGNOSIS — E1165 Type 2 diabetes mellitus with hyperglycemia: Secondary | ICD-10-CM

## 2015-07-15 DIAGNOSIS — E785 Hyperlipidemia, unspecified: Secondary | ICD-10-CM

## 2015-07-15 DIAGNOSIS — IMO0002 Reserved for concepts with insufficient information to code with codable children: Secondary | ICD-10-CM

## 2015-07-15 NOTE — Patient Instructions (Addendum)
Take am insulin on waking up  30--30--6 for sugars <150 before meals

## 2015-07-15 NOTE — Progress Notes (Signed)
Patient ID: Daisy Larson, female   DOB: 1950/12/31, 64 y.o.   MRN: [id number]   Reason for Appointment: Type II Diabetes and follow-up of recent changes  History of Present Illness   DIAGNOSIS:  Type 2 Diabetes Mellitus, uncontrolled, with complications ( diagnosed around 2003).   Previous history: She had been on various oral hypoglycemic drugs at onset and because of poor control was switched to insulin about 2-1/2 years ago * Metformin  taken off due to cirrhosis.  * Tried Victoza in the past, but requested to be taken off due to fear of developing cancer while on this medication. Initially was taking premixed insulin without adequate control, detailed history not available Was on Levemir and Novolog prior to switching to U-500.  Recent history:   Insulin regimen: Humulin U-500 at meals 3 times a day:  BASE DOSES: 26 AT BREAKFAST; 32 at lunch if eating but 20 if not eating and 10 at supper Correction doses: 151-200 = +1   201-250 =+2   251-300 =+4   301-350 =+6   351+   =+8   She has been taking U-500 insulin with the syringe 3 times a day for about a year with relatively good control.   On her initial consultation she was having overnight hypoglycemia and was taking her insulin based only on pre-meal blood sugar using  a complicated correction dosage chart given by previous endocrinologist  Her insulin dose was changed to reduce her coverage at suppertime to only 10 units of the U-500 and she was given alternatives doses at lunchtime to take depending on whether she would eat lunch or not She was also told to take her insulin 30 minutes before eating More recently she said that she is again starting to have some low sugars overnight and she has cut back on her suppertime insulin coverage 50% in the last week or so With this her fasting blood sugars are not as low early morning  Current blood sugar patterns and problems identified:  She has had low  sugars at about 5-6 AM and has to documented low sugars in the last 2 weeks  Otherwise her morning sugars are fairly good  She tends to have some small snacks before her breakfast which she is eating around 9-10 AM and her blood sugars are usually higher at 10 AM when she takes her insulin  Blood sugars are HIGH fairly consistently in the early afternoon  Also has some high readings after supper when she checks them after about 8 PM  Her A1c appears to be lower than expected for her blood sugars which are now averaging 193 recently     Oral hypoglycemic drugs:  none        Side effects from medications: None         Proper timing of medications in relation to meals: Yes.          Monitors blood glucose: about 3 times a day    Glucometer: One Touch.          Blood Glucose readings   Mean values apply above for all meters except median for One Touch  PRE-MEAL Fasting Lunch Dinner Bedtime Overall  Glucose range:  36-191   131-348   88-413   96-357    Mean/median:      193            Meals: 3 meals per day.  Breakfast is usually around 8 AM, lunch between 1-2  PM , and dinner between 6 to 8 PM.        Physical activity: exercise: Unable to do any           Weight control:  Wt Readings from Last 3 Encounters:  06/10/15 297 lb (134.718 kg)  05/17/15 287 lb 8 oz (130.409 kg)  05/04/15 291 lb 9.6 oz (132.269 kg)           Diabetes labs:  Lab Results  Component Value Date   HGBA1C 6.5 06/10/2015   HGBA1C 6.7* 02/18/2015   HGBA1C 9.1* 10/01/2014   Lab Results  Component Value Date   MICROALBUR 0.3 10/01/2014   LDLCALC 113* 10/01/2014   CREATININE 1.00 06/10/2015       Medication List       This list is accurate as of: 07/15/15 11:59 PM.  Always use your most recent med list.               AMBULATORY NON FORMULARY MEDICATION  Medication Name: Nitroglycerin ointment 0.125% Apply pea-size amount internally 4 times daily     anastrozole 1 MG tablet  Commonly known  as:  ARIMIDEX  Take 1 tablet (1 mg total) by mouth daily.     aspirin 81 MG tablet  Take 81 mg by mouth daily.     b complex vitamins capsule  Take 1 capsule by mouth daily.     buPROPion 300 MG 24 hr tablet  Commonly known as:  WELLBUTRIN XL  Take 1 tablet by mouth Daily.     calcium carbonate 600 MG Tabs tablet  Commonly known as:  OS-CAL  Take 1,200 mg by mouth.     Cinnamon 500 MG capsule  Take 500 mg by mouth daily.     citalopram 20 MG tablet  Commonly known as:  CELEXA  Take 1 tablet by mouth daily.     DULoxetine 30 MG capsule  Commonly known as:  CYMBALTA  Take 30 mg by mouth daily.     DULoxetine 60 MG capsule  Commonly known as:  CYMBALTA  Take 60 mg by mouth daily.     EQL OMEGA 3 FISH OIL 1400 MG Caps  Take 1 capsule by mouth daily.     fluticasone 50 MCG/ACT nasal spray  Commonly known as:  FLONASE  Frequency:PRN   Dosage:50   MCG  Instructions:  Note:Dose: 50 MCG     furosemide 40 MG tablet  Commonly known as:  LASIX  Take 20 mg by mouth 2 (two) times daily.     gabapentin 800 MG tablet  Commonly known as:  NEURONTIN  800 mg 3 (three) times daily.     GENADUR Liqd  Apply 1 application topically daily.     glucagon 1 MG injection  Commonly known as:  GLUCAGON EMERGENCY  Inject 1 mg into the vein once as needed.     glucose blood test strip  Use as instructed to check blood sugar 3 times per day Dx: E11.65     Grape Seed Extract 50 MG Caps  Take by mouth daily.     hydrocortisone-pramoxine 2.5-1 % rectal cream  Commonly known as:  ANALPRAM HC  Place rectally as needed for hemorrhoids.     insulin regular human CONCENTRATED 500 UNIT/ML injection  Commonly known as:  HUMULIN R  Use three times daily with meals per sliding scale. Max dose 80 units per day. This is equivalent to 400 units of U-100 insulin per day.  Insulin Syringe-Needle U-100 31G X 5/16" 0.5 ML Misc  Commonly known as:  B-D INS SYRINGE 0.5CC/31GX5/16  Use for insulin  administration three times daily     JUBLIA 10 % Soln  Generic drug:  Efinaconazole  Apply 1 application topically daily.     lactulose 20 G packet  Commonly known as:  CEPHULAC  Take 20 g by mouth 3 (three) times daily.     lidocaine-hydrocortisone 3-0.5 % Crea  Commonly known as:  ANAMANTEL HC  Use small application of cream 4 times daily for 2 weeks then as needed.     lisinopril 20 MG tablet  Commonly known as:  PRINIVIL,ZESTRIL  Take 20 mg by mouth every morning. Take in the morning     loratadine 10 MG tablet  Commonly known as:  CLARITIN  Take 10 mg by mouth daily.     Magnesium Oxide 400 MG Caps  Take 400 mg by mouth daily.     meclizine 12.5 MG tablet  Commonly known as:  ANTIVERT  Take 12.5 mg by mouth 3 (three) times daily as needed.     Milk Thistle 500 MG Caps  Take 1,000 mg by mouth daily.     MULTIPLE VITAMIN PO  Take 1 tablet by mouth daily.     ONETOUCH DELICA LANCETS 38B Misc  Use to test sugars 4 x daily.     pantoprazole 40 MG tablet  Commonly known as:  PROTONIX  Take 40 mg by mouth at bedtime.     polyethylene glycol packet  Commonly known as:  MIRALAX / GLYCOLAX  Take 17 g by mouth daily.     pramipexole 1 MG tablet  Commonly known as:  MIRAPEX  Take 2 mg by mouth at bedtime.     propranolol ER 60 MG 24 hr capsule  Commonly known as:  INDERAL LA  Take 60 mg by mouth daily. Take at night     spironolactone 50 MG tablet  Commonly known as:  ALDACTONE  Take 50 mg by mouth 2 (two) times daily.     traMADol 50 MG tablet  Commonly known as:  ULTRAM  Take 50 mg by mouth every 6 (six) hours as needed for pain.     XIFAXAN 550 MG Tabs tablet  Generic drug:  rifaximin  Take 1 tablet by mouth 2 (two) times daily.        Allergies:  Allergies  Allergen Reactions  . Other Rash    Blisters Per Patient - she has been told she can not have general anesthesia "unless life-threatening"  . Tape   . Oxymorphone     lethargic  .  Barbiturates Dermatitis and Other (See Comments)    Drowsiness, patient states that Barbiturates & Narcotics make her extremely sleepy and drowsy 24 Apr 2013 Drowsiness  "sleeps forever"    Past Medical History  Diagnosis Date  . Cirrhosis   . Multiple thyroid nodules   . History of stomach ulcers   . Hernia   . Colon polyps 12/02/2012    TUBULAR ADENOMAS (X2) and Hyperplastic (X1)  . Neuropathy of foot   . Restless legs   . Complication of anesthesia     WITH SECON HERNIA REPAIR 2012 [facility]  DIFFICULTY O2 SAT DROPPING ADMIT TO HOSPITAL   . Bundle branch block   . Hypertension   . Angina     COSTOCONDRITIS    . Shortness of breath     WITH EXERTION   .  Diabetes mellitus   . Hepatitis      Did Not have hepatitis but needed Hep A and Hep B injections2012 SPOTS ON LIVER  DR Alonza Bogus  [phone]  . GERD (gastroesophageal reflux disease)     ULCERS  . Hyperthyroidism     NODULES ON THYROID  (DR BALEN 37  12-609)  . Goiter   . Headache(784.0)     MIGRAINES  . Neuromuscular disorder     PERIPH NEUROPATHY   . Anxiety   . Depression   . Breast cancer 01/19/12     right breast lumpectomy/ER/PR positibe,her-2 neg.  . Allergy     tape  . S/P radiation therapy 02/19/12 - 04/01/12    Right Breast: 5000 cgy/25 Fractions with Boost/ 1000 cGy/5 Fractions  . Iron deficiency anemia 10/16/2012  . Gastritis   . External hemorrhoids   . Diverticulosis   . Sleep apnea   . Bell's palsy 2014    twice  . Heart murmur   . Breast cancer 2011  . Short of breath on exertion     Past Surgical History  Procedure Laterality Date  . Appendectomy    . Tonsillectomy    . Cesarean section    . Tubal ligation    . Abdominal hysterectomy    . Back surgery    . Hernia repair      X 2  . Anal fissure repair    . Carpal tunnel release      LEFT   . Colonoscopy w/ polypectomy  12/02/2012  . Breast surgery      BIOPSY  Right Breast -  Sentinel Lymph Nodes  . Liver biopsy  03/28/11     [facility] - Cirrhosis  . Spinal fusion    . Esophagogastroduodenoscopy  12/02/2012    Gastritis  . Hand surgery Bilateral     Family History  Problem Relation Age of Onset  . Lung cancer Maternal Grandfather   . Colon cancer Paternal Grandfather   . Diabetes Mother   . Hypertension Mother   . Heart disease Maternal Uncle   . Diabetes Sister   . Heart disease Sister   . Thyroid disease Sister   . Heart disease Father   . Hypertension Father   . Diabetes Daughter   . Hypertension Other   . Heart disease Other     Social History:  reports that she has never smoked. She has never used smokeless tobacco. She reports that she does not drink alcohol or use illicit drugs.  Review of Systems:  Edema legs has been  controlled with diuretics including Lasix and Aldactone  Cirrhosis of liver, long-standing and followed at [facility].  Has significant hepatic insufficiency and is taking lactulose Also on milk thistle.  Recent ALT slightly better  Lab Results  Component Value Date   ALT 31 06/10/2015   ALT 44 01/06/2015    Hypertension:  treated with lisinopril 20 mg   Lipids: She has not been on medication because of abnormal liver functions  Lab Results  Component Value Date   CHOL 173 10/01/2014   HDL 45.50 10/01/2014   LDLCALC 113* 10/01/2014   TRIG 72.0 10/01/2014   CHOLHDL 4 10/01/2014   NEUROPATHY: Has numbness in his legs extending up to the middle of the lower leg Symptoms controlled with gabapentin and Cymbalta   Last foot exam in 6/16 showed the following:  absent Normal Monofilament testing bilaterally ( can feel at few places on left sole). +  deformities of toes. Nails dystrophic. Skin normal color. Dry skin. Preulcerative callus right medial foot, with calluses bilaterally  Reportedly also has Charcot foot   ROS     Examination:   BP 124/66 mmHg  Pulse 68  Temp(Src) 98.5 F (36.9 C)  Resp 16  Ht 5' 3.75" (1.619 m)  Wt   SpO2 95%   There is no weight on file to calculate BMI.     ASSESSMENT/ PLAN:    Diabetes type 2:  Blood glucose control is again very erratic See history of present illness for detailed discussion of his current management, blood sugar patterns and problems identified Today since she has brought her monitor for download have been able to see her blood sugar patterns   As discussed above she has had tendency to overnight hypoglycemia, high readings late morning and afternoon and some high readings postprandially after supper also Today she is wanting to discontinue her U-500 insulin after her supply runs out because of the high cost of the medication in the donut hole She also does not want to try the V-go pump because of expense Also would not be able to afford the newer basal insulins   Discussed that the best option for the patient would be to use an insulin pump as this would be more effective in controlling her blood sugars and reduced variability and hypoglycemia overnight.  She could potentially use Novolin R in the pump Discussed basic principles of the insulin pump and she will need to have a C-peptide done to verify that she is eligible for  Meanwhile will have her reduce her coverage of insulin at suppertime to prevent hypoglycemia overnight but increase the dose in the mornings She also needs to take her morning insulin early in the morning to prevent rise in blood sugar at about 10-11 AM  HYPERLIPIDEMIA: She should be able to take lipid-lowering drugs but she is still hesitant because of her liver dysfunction  Counseling time on subjects discussed above is over 50% of today's 25 minute visit  Patient Instructions  Take am insulin on waking up  30--30--6 for sugars <150 before meals      Mayre Bury 07/16/2015, 5:03 PM   Addendum: A1c 6.5, electrolytes normal

## 2015-08-20 ENCOUNTER — Other Ambulatory Visit: Payer: Self-pay | Admitting: *Deleted

## 2015-08-20 ENCOUNTER — Telehealth: Payer: Self-pay | Admitting: Endocrinology

## 2015-08-20 MED ORDER — "INSULIN SYRINGE-NEEDLE U-100 31G X 5/16"" 0.5 ML MISC": Status: DC

## 2015-08-20 NOTE — Telephone Encounter (Signed)
Patients daughter called and would like a refill sent to her pharmacy   Rx: Syringes and needles (nano)?    Pharm[facility]: [facility]    Thank You

## 2015-08-20 NOTE — Telephone Encounter (Signed)
RX SENT

## 2015-08-24 ENCOUNTER — Other Ambulatory Visit: Payer: Self-pay | Admitting: *Deleted

## 2015-08-24 ENCOUNTER — Telehealth: Payer: Self-pay | Admitting: Endocrinology

## 2015-08-24 DIAGNOSIS — E1310 Other specified diabetes mellitus with ketoacidosis without coma: Secondary | ICD-10-CM

## 2015-08-24 MED ORDER — GLUCOSE BLOOD VI STRP: ORAL_STRIP | Status: DC

## 2015-08-24 NOTE — Telephone Encounter (Signed)
Patient need a prescription for test strips for the verio meter, patient ran out so quickly because have had so many low b/s. please advise

## 2015-08-25 ENCOUNTER — Encounter: Payer: Self-pay | Admitting: Oncology

## 2015-08-25 ENCOUNTER — Encounter: Payer: Self-pay | Admitting: Endocrinology

## 2015-08-26 ENCOUNTER — Telehealth: Payer: Self-pay | Admitting: *Deleted

## 2015-08-26 ENCOUNTER — Emergency Department (HOSPITAL_BASED_OUTPATIENT_CLINIC_OR_DEPARTMENT_OTHER): Payer: Medicare Other

## 2015-08-26 ENCOUNTER — Emergency Department (HOSPITAL_BASED_OUTPATIENT_CLINIC_OR_DEPARTMENT_OTHER)
Admission: EM | Admit: 2015-08-26 | Discharge: 2015-08-26 | Disposition: A | Discharge status: Home / Self Care | Payer: Medicare Other | Attending: Emergency Medicine | Admitting: Emergency Medicine

## 2015-08-26 ENCOUNTER — Other Ambulatory Visit: Payer: Self-pay | Admitting: *Deleted

## 2015-08-26 ENCOUNTER — Encounter (HOSPITAL_BASED_OUTPATIENT_CLINIC_OR_DEPARTMENT_OTHER): Payer: Self-pay

## 2015-08-26 DIAGNOSIS — S2232XA Fracture of one rib, left side, initial encounter for closed fracture: Secondary | ICD-10-CM | POA: Diagnosis not present

## 2015-08-26 DIAGNOSIS — F329 Major depressive disorder, single episode, unspecified: Secondary | ICD-10-CM | POA: Insufficient documentation

## 2015-08-26 DIAGNOSIS — W1839XA Other fall on same level, initial encounter: Secondary | ICD-10-CM | POA: Diagnosis not present

## 2015-08-26 DIAGNOSIS — K2970 Gastritis, unspecified, without bleeding: Secondary | ICD-10-CM | POA: Insufficient documentation

## 2015-08-26 DIAGNOSIS — G5790 Unspecified mononeuropathy of unspecified lower limb: Secondary | ICD-10-CM | POA: Insufficient documentation

## 2015-08-26 DIAGNOSIS — G709 Myoneural disorder, unspecified: Secondary | ICD-10-CM | POA: Insufficient documentation

## 2015-08-26 DIAGNOSIS — Z862 Personal history of diseases of the blood and blood-forming organs and certain disorders involving the immune mechanism: Secondary | ICD-10-CM | POA: Insufficient documentation

## 2015-08-26 DIAGNOSIS — Z7982 Long term (current) use of aspirin: Secondary | ICD-10-CM | POA: Diagnosis not present

## 2015-08-26 DIAGNOSIS — Y9289 Other specified places as the place of occurrence of the external cause: Secondary | ICD-10-CM | POA: Diagnosis not present

## 2015-08-26 DIAGNOSIS — Z7951 Long term (current) use of inhaled steroids: Secondary | ICD-10-CM | POA: Diagnosis not present

## 2015-08-26 DIAGNOSIS — K219 Gastro-esophageal reflux disease without esophagitis: Secondary | ICD-10-CM | POA: Diagnosis not present

## 2015-08-26 DIAGNOSIS — I209 Angina pectoris, unspecified: Secondary | ICD-10-CM | POA: Diagnosis not present

## 2015-08-26 DIAGNOSIS — Z794 Long term (current) use of insulin: Secondary | ICD-10-CM | POA: Insufficient documentation

## 2015-08-26 DIAGNOSIS — W19XXXA Unspecified fall, initial encounter: Secondary | ICD-10-CM

## 2015-08-26 DIAGNOSIS — Z79899 Other long term (current) drug therapy: Secondary | ICD-10-CM | POA: Insufficient documentation

## 2015-08-26 DIAGNOSIS — R011 Cardiac murmur, unspecified: Secondary | ICD-10-CM | POA: Diagnosis not present

## 2015-08-26 DIAGNOSIS — Y9389 Activity, other specified: Secondary | ICD-10-CM | POA: Diagnosis not present

## 2015-08-26 DIAGNOSIS — S299XXA Unspecified injury of thorax, initial encounter: Secondary | ICD-10-CM | POA: Diagnosis present

## 2015-08-26 DIAGNOSIS — I10 Essential (primary) hypertension: Secondary | ICD-10-CM | POA: Insufficient documentation

## 2015-08-26 DIAGNOSIS — Z853 Personal history of malignant neoplasm of breast: Secondary | ICD-10-CM | POA: Diagnosis not present

## 2015-08-26 DIAGNOSIS — C50411 Malignant neoplasm of upper-outer quadrant of right female breast: Secondary | ICD-10-CM

## 2015-08-26 DIAGNOSIS — C50911 Malignant neoplasm of unspecified site of right female breast: Secondary | ICD-10-CM

## 2015-08-26 DIAGNOSIS — F419 Anxiety disorder, unspecified: Secondary | ICD-10-CM | POA: Insufficient documentation

## 2015-08-26 DIAGNOSIS — Z86010 Personal history of colonic polyps: Secondary | ICD-10-CM | POA: Insufficient documentation

## 2015-08-26 DIAGNOSIS — E119 Type 2 diabetes mellitus without complications: Secondary | ICD-10-CM | POA: Insufficient documentation

## 2015-08-26 DIAGNOSIS — G2581 Restless legs syndrome: Secondary | ICD-10-CM | POA: Diagnosis not present

## 2015-08-26 DIAGNOSIS — Y998 Other external cause status: Secondary | ICD-10-CM | POA: Diagnosis not present

## 2015-08-26 MED ORDER — TRAMADOL HCL 50 MG PO TABS: 50.0000 mg | ORAL_TABLET | Freq: Four times a day (QID) | ORAL | Status: DC | PRN

## 2015-08-26 MED ORDER — TRAMADOL HCL 50 MG PO TABS
50.0000 mg | ORAL_TABLET | Freq: Once | ORAL | Status: AC
  Administered 2015-08-26: 50 mg via ORAL
  Filled 2015-08-26: qty 1

## 2015-08-26 MED ORDER — "INSULIN SYRINGE-NEEDLE U-100 31G X 5/16"" 0.3 ML MISC": Status: DC

## 2015-08-26 MED ORDER — ANASTROZOLE 1 MG PO TABS: 1.0000 mg | ORAL_TABLET | Freq: Every day | ORAL | Status: DC

## 2015-08-26 NOTE — ED Provider Notes (Signed)
CSN: [id number]     Arrival date & time 08/26/15  1239 History   First MD Initiated Contact with Patient 08/26/15 1309     Chief Complaint  Patient presents with  . Fall     (Consider location/radiation/quality/duration/timing/severity/associated sxs/prior Treatment) Patient is a 64 y.o. female presenting with fall.  Fall This is a recurrent problem. The current episode started yesterday. Episode frequency: once. The problem has been resolved. Associated symptoms include chest pain (left lateral). Pertinent negatives include no abdominal pain and no shortness of breath. Exacerbated by: Moving, coughing, deep breaths. Relieved by: Being still. Treatments tried: Tramadol. The treatment provided moderate relief.    Past Medical History  Diagnosis Date  . Cirrhosis ([place])   . Multiple thyroid nodules   . History of stomach ulcers   . Hernia   . Colon polyps 12/02/2012    TUBULAR ADENOMAS (X2) and Hyperplastic (X1)  . Neuropathy of foot   . Restless legs   . Complication of anesthesia     WITH SECON HERNIA REPAIR 2012 [facility]  DIFFICULTY O2 SAT DROPPING ADMIT TO HOSPITAL   . Bundle branch block   . Hypertension   . Angina     COSTOCONDRITIS    . Shortness of breath     WITH EXERTION   . Diabetes mellitus   . Hepatitis      Did Not have hepatitis but needed Hep A and Hep B injections2012 SPOTS ON LIVER  DR Alonza Bogus  [phone]  . GERD (gastroesophageal reflux disease)     ULCERS  . Hyperthyroidism     NODULES ON THYROID  (DR BALEN 37  12-609)  . Goiter   . Headache(784.0)     MIGRAINES  . Neuromuscular disorder ([place])     PERIPH NEUROPATHY   . Anxiety   . Depression   . Breast cancer ([place]) 01/19/12     right breast lumpectomy/ER/PR positibe,her-2 neg.  . Allergy     tape  . S/P radiation therapy 02/19/12 - 04/01/12    Right Breast: 5000 cgy/25 Fractions with Boost/ 1000 cGy/5 Fractions  . Iron deficiency anemia 10/16/2012  . Gastritis   . External hemorrhoids   .  Diverticulosis   . Sleep apnea   . Bell's palsy 2014    twice  . Heart murmur   . Breast cancer ([place]) 2011  . Short of breath on exertion    Past Surgical History  Procedure Laterality Date  . Appendectomy    . Tonsillectomy    . Cesarean section    . Tubal ligation    . Abdominal hysterectomy    . Back surgery    . Hernia repair      X 2  . Anal fissure repair    . Carpal tunnel release      LEFT   . Colonoscopy w/ polypectomy  12/02/2012  . Breast surgery      BIOPSY  Right Breast -  Sentinel Lymph Nodes  . Liver biopsy  03/28/11    [facility] - Cirrhosis  . Spinal fusion    . Esophagogastroduodenoscopy  12/02/2012    Gastritis  . Hand surgery Bilateral    Family History  Problem Relation Age of Onset  . Lung cancer Maternal Grandfather   . Colon cancer Paternal Grandfather   . Diabetes Mother   . Hypertension Mother   . Heart disease Maternal Uncle   . Diabetes Sister   . Heart disease Sister   . Thyroid  disease Sister   . Heart disease Father   . Hypertension Father   . Diabetes Daughter   . Hypertension Other   . Heart disease Other    Social History  Substance Use Topics  . Smoking status: Never Smoker   . Smokeless tobacco: Never Used  . Alcohol Use: No   OB History    Gravida Para Term Preterm AB TAB SAB Ectopic Multiple Living   2 2              Obstetric Comments    Menarch age16G2, 39, parity age 75, Use of HRT short period of time     Review of Systems  Respiratory: Negative for shortness of breath.   Cardiovascular: Positive for chest pain (left lateral).  Gastrointestinal: Negative for abdominal pain.  All other systems reviewed and are negative.     Allergies  Other; Tape; Oxymorphone; and Barbiturates  Home Medications   Prior to Admission medications   Medication Sig Start Date End Date Taking? Authorizing Provider  AMBULATORY NON FORMULARY MEDICATION Medication Name: Nitroglycerin ointment 0.125% Apply pea-size  amount internally 4 times daily 03/04/15   Jerene Bears, MD  anastrozole (ARIMIDEX) 1 MG tablet Take 1 tablet (1 mg total) by mouth daily. 01/13/15   Laurie Panda, NP  aspirin 81 MG tablet Take 81 mg by mouth daily.    Historical Provider, MD  b complex vitamins capsule Take 1 capsule by mouth daily.     Historical Provider, MD  buPROPion (WELLBUTRIN XL) 300 MG 24 hr tablet Take 1 tablet by mouth Daily. 07/08/12   Historical Provider, MD  calcium carbonate (OS-CAL) 600 MG TABS tablet Take 1,200 mg by mouth.    Historical Provider, MD  Cinnamon 500 MG capsule Take 500 mg by mouth daily.     Historical Provider, MD  citalopram (CELEXA) 20 MG tablet Take 1 tablet by mouth daily. 03/13/15   Historical Provider, MD  Dermatological Products, [place]. (GENADUR) LIQD Apply 1 application topically daily. 04/20/15   Historical Provider, MD  DULoxetine (CYMBALTA) 30 MG capsule Take 30 mg by mouth daily.    Historical Provider, MD  DULoxetine (CYMBALTA) 60 MG capsule Take 60 mg by mouth daily.    Historical Provider, MD  fluticasone (FLONASE) 50 MCG/ACT nasal spray Frequency:PRN   Dosage:50   MCG  Instructions:  Note:Dose: 50 MCG 05/04/11   Historical Provider, MD  furosemide (LASIX) 40 MG tablet Take 20 mg by mouth 2 (two) times daily.  02/21/13   Historical Provider, MD  gabapentin (NEURONTIN) 800 MG tablet 800 mg 3 (three) times daily.  12/21/11   Historical Provider, MD  glucagon (GLUCAGON EMERGENCY) 1 MG injection Inject 1 mg into the vein once as needed. 05/04/15   Elayne Snare, MD  glucose blood test strip Use as instructed to check blood sugar 3 times per day Dx: E11.65 08/24/15   Elayne Snare, MD  Grape Seed Extract 50 MG CAPS Take by mouth daily.     Historical Provider, MD  hydrocortisone-pramoxine [facility]) 2.5-1 % rectal cream Place rectally as needed for hemorrhoids. 12/02/12   Sable Feil, MD  insulin regular human CONCENTRATED (HUMULIN R) 500 UNIT/ML SOLN injection Use three times daily with meals per  sliding scale. Max dose 80 units per day. This is equivalent to 400 units of U-100 insulin per day. 04/07/15   Haydee Monica, MD  Insulin Syringe-Needle U-100 (B-D INS SYRINGE 0.5CC/31GX5/16) 31G X 5/16" 0.5 ML MISC Use for insulin  administration three times daily 08/20/15   Elayne Snare, MD  JUBLIA 10 % SOLN Apply 1 application topically daily. 04/19/15   Historical Provider, MD  lactulose (CEPHULAC) 20 G packet Take 20 g by mouth 3 (three) times daily.    Historical Provider, MD  lidocaine-hydrocortisone (ANAMANTEL HC) 3-0.5 % CREA Use small application of cream 4 times daily for 2 weeks then as needed. 12/07/14   Amy S Esterwood, PA-C  lisinopril (PRINIVIL,ZESTRIL) 20 MG tablet Take 20 mg by mouth every morning. Take in the morning 11/24/11   Historical Provider, MD  loratadine (CLARITIN) 10 MG tablet Take 10 mg by mouth daily.    Historical Provider, MD  Magnesium Oxide 400 MG CAPS Take 400 mg by mouth daily.     Historical Provider, MD  meclizine (ANTIVERT) 12.5 MG tablet Take 12.5 mg by mouth 3 (three) times daily as needed.    Historical Provider, MD  Milk Thistle 500 MG CAPS Take 1,000 mg by mouth daily.  05/04/11   Historical Provider, MD  MULTIPLE VITAMIN PO Take 1 tablet by mouth daily.    Historical Provider, MD  Omega-3 Fatty Acids (EQL OMEGA 3 FISH OIL) 1400 MG CAPS Take 1 capsule by mouth daily.    Historical Provider, MD  [facility] DELICA LANCETS 58I MISC Use to test sugars 4 x daily. 02/19/15   Haydee Monica, MD  pantoprazole (PROTONIX) 40 MG tablet Take 40 mg by mouth at bedtime.  05/05/14   Historical Provider, MD  polyethylene glycol (MIRALAX / GLYCOLAX) packet Take 17 g by mouth daily.    Historical Provider, MD  pramipexole (MIRAPEX) 1 MG tablet Take 2 mg by mouth at bedtime.     Historical Provider, MD  propranolol (INDERAL LA) 60 MG 24 hr capsule Take 60 mg by mouth daily. Take at night 11/23/11   Historical Provider, MD  spironolactone (ALDACTONE) 50 MG tablet Take 50 mg by mouth 2  (two) times daily.  04/30/13   Historical Provider, MD  traMADol (ULTRAM) 50 MG tablet Take 50 mg by mouth every 6 (six) hours as needed for pain.    Historical Provider, MD  XIFAXAN 550 MG TABS tablet Take 1 tablet by mouth 2 (two) times daily. 04/06/15   Historical Provider, MD   BP 110/32 mmHg  Pulse 70  Temp(Src) 98.2 F (36.8 C) (Oral)  Resp 20  Ht '5\' 8"'  (1.727 m)  Wt 297 lb (134.718 kg)  BMI 45.17 kg/m2  SpO2 96% Physical Exam  Constitutional: She is oriented to person, place, and time. She appears well-developed and well-nourished. No distress.  HENT:  Head: Normocephalic and atraumatic. Head is without raccoon's eyes and without Battle's sign.  Nose: Nose normal.  Eyes: Conjunctivae and EOM are normal. Pupils are equal, round, and reactive to light. No scleral icterus.  Neck: No spinous process tenderness and no muscular tenderness present.  Cardiovascular: Normal rate, regular rhythm, normal heart sounds and intact distal pulses.   No murmur heard. Pulmonary/Chest: Effort normal and breath sounds normal. She has no rales. She exhibits no tenderness.    Abdominal: Soft. There is no tenderness. There is no rebound and no guarding.  Musculoskeletal: Normal range of motion. She exhibits no edema or tenderness.       Thoracic back: She exhibits no tenderness and no bony tenderness.       Lumbar back: She exhibits no tenderness and no bony tenderness.  No evidence of trauma to extremities, except as noted.  2+ distal  pulses.    Left lower extremity in brace.  Neurological: She is alert and oriented to person, place, and time.  Skin: Skin is warm and dry. No rash noted.  Psychiatric: She has a normal mood and affect.  Nursing note and vitals reviewed.   ED Course  Procedures (including critical care time) Labs Review Labs Reviewed - No data to display  Imaging Review Dg Ribs Unilateral W/chest Left  08/26/2015  CLINICAL DATA:  C/o fell forward onto Lt side yesterday on  hardwood floor and has pain in anterior lower ribs that radiates into her back with deep inspiration; BB placed at site of pain EXAM: LEFT RIBS AND CHEST - 3+ VIEW COMPARISON:  10/07/2012 FINDINGS: Heart size is normal. Lungs are clear. No pneumothorax or pleural effusion. Surgical clips overlie the right breast and axilla. Oblique views are performed still an acute fracture of the left lateral sixth rib. IMPRESSION: 1.  No evidence for acute cardiopulmonary abnormality. 2. Fracture of the left sixth rib. Electronically Signed   By: Nolon Nations M.D.   On: 08/26/2015 13:27   I have personally reviewed and evaluated these images and lab results as part of my medical decision-making.   EKG Interpretation None      MDM   Final diagnoses:  Fall, initial encounter  Left rib fracture, closed, initial encounter    64 year old female presenting secondary to left sided rib pain after a fall yesterday. She tripped over a shop vac. She denies other injuries. She did not hit her head or lose consciousness. Her plain film shows a left isolated rib fracture. This consistent with her pain and exam. She reports that she gets relief with tramadol, so will prescribe this. Advised her to use her incentive spirometer (she has one at home) follow-up with her primary doctor. Return precautions given.    Serita Grit, MD 08/26/15 [phone]

## 2015-08-26 NOTE — ED Notes (Signed)
Pt fell yesterday-pain to left rib area

## 2015-08-26 NOTE — Discharge Instructions (Signed)

## 2015-08-26 NOTE — Telephone Encounter (Signed)
No note

## 2015-08-27 ENCOUNTER — Other Ambulatory Visit: Payer: Self-pay | Admitting: *Deleted

## 2015-08-30 ENCOUNTER — Other Ambulatory Visit: Payer: Self-pay

## 2015-08-30 MED ORDER — "INSULIN SYRINGE-NEEDLE U-100 31G X 5/16"" 0.3 ML MISC": Status: DC

## 2015-09-01 ENCOUNTER — Other Ambulatory Visit: Payer: Self-pay | Admitting: *Deleted

## 2015-09-01 DIAGNOSIS — E1310 Other specified diabetes mellitus with ketoacidosis without coma: Secondary | ICD-10-CM

## 2015-09-01 MED ORDER — GLUCOSE BLOOD VI STRP: ORAL_STRIP | Status: AC

## 2015-09-01 NOTE — Telephone Encounter (Signed)
Please call in new script for 5 times a day verio meter, called into [facility] in [place]

## 2015-09-01 NOTE — Telephone Encounter (Signed)
rx was sent in 4 times per day

## 2015-09-02 ENCOUNTER — Other Ambulatory Visit: Payer: Self-pay | Admitting: *Deleted

## 2015-09-02 DIAGNOSIS — C50411 Malignant neoplasm of upper-outer quadrant of right female breast: Secondary | ICD-10-CM

## 2015-09-02 DIAGNOSIS — C50911 Malignant neoplasm of unspecified site of right female breast: Secondary | ICD-10-CM

## 2015-09-02 MED ORDER — ANASTROZOLE 1 MG PO TABS: 1.0000 mg | ORAL_TABLET | Freq: Every day | ORAL | Status: DC

## 2015-09-06 ENCOUNTER — Ambulatory Visit: Payer: BLUE CROSS/BLUE SHIELD | Admitting: Endocrinology

## 2015-09-13 ENCOUNTER — Other Ambulatory Visit: Payer: Self-pay | Admitting: *Deleted

## 2015-09-13 MED ORDER — "INSULIN SYRINGE-NEEDLE U-100 31G X 5/16"" 0.3 ML MISC": Status: DC

## 2015-09-14 ENCOUNTER — Encounter: Payer: Self-pay | Admitting: Endocrinology

## 2015-09-14 ENCOUNTER — Ambulatory Visit (INDEPENDENT_AMBULATORY_CARE_PROVIDER_SITE_OTHER): Payer: Medicare Other | Admitting: Endocrinology

## 2015-09-14 ENCOUNTER — Encounter: Payer: Medicare Other | Attending: Endocrinology | Admitting: Nutrition

## 2015-09-14 VITALS — BP 104/60 | HR 61 | Temp 98.5°F | Resp 16 | Ht 63.75 in

## 2015-09-14 DIAGNOSIS — Z713 Dietary counseling and surveillance: Secondary | ICD-10-CM | POA: Insufficient documentation

## 2015-09-14 DIAGNOSIS — E1165 Type 2 diabetes mellitus with hyperglycemia: Secondary | ICD-10-CM | POA: Diagnosis not present

## 2015-09-14 DIAGNOSIS — E785 Hyperlipidemia, unspecified: Secondary | ICD-10-CM | POA: Diagnosis not present

## 2015-09-14 DIAGNOSIS — Z794 Long term (current) use of insulin: Secondary | ICD-10-CM

## 2015-09-14 DIAGNOSIS — IMO0002 Reserved for concepts with insufficient information to code with codable children: Secondary | ICD-10-CM

## 2015-09-14 DIAGNOSIS — E1142 Type 2 diabetes mellitus with diabetic polyneuropathy: Secondary | ICD-10-CM | POA: Diagnosis present

## 2015-09-14 LAB — BASIC METABOLIC PANEL
BUN: 12 mg/dL (ref 6–23)
CALCIUM: 9.4 mg/dL (ref 8.4–10.5)
CHLORIDE: 102 meq/L (ref 96–112)
CO2: 35 mEq/L — ABNORMAL HIGH (ref 19–32)
CREATININE: 0.81 mg/dL (ref 0.40–1.20)
GFR: 75.36 mL/min (ref >60.00)
Glucose, Bld: 116 mg/dL — ABNORMAL HIGH (ref 70–99)
Potassium: 4.8 mEq/L (ref 3.5–5.1)
Sodium: 141 mEq/L (ref 135–145)

## 2015-09-14 LAB — MICROALBUMIN / CREATININE URINE RATIO
Creatinine,U: 123.8 mg/dL
MICROALB/CREAT RATIO: 1.7 mg/g (ref 0.0–30.0)
Microalb, Ur: 2.1 mg/dL — ABNORMAL HIGH (ref 0.0–1.9)

## 2015-09-14 LAB — URINALYSIS, ROUTINE W REFLEX MICROSCOPIC
Bilirubin Urine: NEGATIVE
Hgb urine dipstick: NEGATIVE
KETONES UR: NEGATIVE
Nitrite: NEGATIVE
PH: 7.5 (ref 5.0–8.0)
RBC / HPF: NONE SEEN (ref 0–?)
SPECIFIC GRAVITY, URINE: 1.015 (ref 1.000–1.030)
Total Protein, Urine: NEGATIVE
URINE GLUCOSE: NEGATIVE
UROBILINOGEN UA: 1 (ref 0.0–1.0)

## 2015-09-14 LAB — LIPID PANEL
CHOLESTEROL: 148 mg/dL (ref 0–200)
HDL: 41.5 mg/dL (ref >39.00)
LDL Cholesterol: 94 mg/dL (ref 0–99)
NonHDL: 106.65
TRIGLYCERIDES: 63 mg/dL (ref 0.0–149.0)
Total CHOL/HDL Ratio: 4
VLDL: 12.6 mg/dL (ref 0.0–40.0)

## 2015-09-14 NOTE — Progress Notes (Signed)
Discussed advantages and disadvantages of insulin pump therapy.  She was shown the [employer]tronic pump per Dr. Ronnie Derby orders.  We discussed how it works.  She is interested if her insurance will pay for it.  She had a C-Peptide done today and paperwork was filled out with insurance information, and it was faxed in.   I will let them know if C-Peptide is low and Medicare will pay for this.   Wife says that husband will be doing most of the workings of the pump, and I explained that she will also need to learn this, in case her husband is not available due to unforseen circumstance.  She agreed and they had no final questions.

## 2015-09-14 NOTE — Patient Instructions (Addendum)
Humulin U-500 at meals 3 times a day:   BASE DOSES 33 AT BREAKFAST; 28 at lunch if eating but 16 if not eating and 6 at supper  Correction doses: 151-200 = +1   201-250 =+2    251-300 =+3    301-350 =+5   351+   =+7  If sugar <90 reduce by 2 units  Always take insulin before meal, after eating if <80

## 2015-09-14 NOTE — Progress Notes (Signed)
Patient ID: Daisy Larson, female   DOB: Jun 07, 1951, 64 y.o.   MRN: [id number]   Reason for Appointment: Type II Diabetes   History of Present Illness   DIAGNOSIS:  Type 2 Diabetes Mellitus, uncontrolled, with complications ( diagnosed around 2003).   Previous history: She had been on various oral hypoglycemic drugs at onset and because of poor control was switched to insulin about 2-1/2 years ago * Metformin  taken off due to cirrhosis.  * Tried Victoza in the past, but requested to be taken off due to fear of developing cancer while on this medication. Initially was taking premixed insulin without adequate control, detailed history not available Was on Levemir and Novolog prior to switching to U-500.  Recent history:   Insulin regimen: Humulin U-500 at meals 3 times a day:  BASE DOSES:  30 AT BREAKFAST; 30 at lunch if eating but 20 if not eating and  6 at supper Correction doses: 151-200 = +1   201-250 =+2   251-300 =+4   301-350 =+6   351+   =+8   She has been taking U-500 insulin with the syringe 3 times a day for about a year   Her insulin dose was Reduced at suppertime on the last visit because of tendency to overnight hypoglycemia and she is supposed to be taking 6 units. She was also told to take her insulin 30 minutes before eating  Current blood sugar patterns and problems identified:  She has had  Marked variability in her blood sugars throughout the day and overnight also  She is having her insulin doses adjusted by her husband and is usually trying to take insulin at mealtimes based on pre-meal blood sugar and correction doses  Her fasting blood sugars are overall mildly increased but variable  Has only one low blood sugar of 66 waking up in the morning  She has had one episode of low blood sugar at 4 AM probably because she took her evening U-500 insulin at bedtime instead of suppertime tonight before  Husband thinks she is having  frequent low blood sugars but has only about 4 significant low sugars documented in the last 2 weeks  CORRECTION doses: These do not appear to be working consistently and occasionally will get low following correcting the high sugar at breakfast and once at lunch  COMPLIANCE with her suppertime glucose monitoring and insulin is somewhat variable and she will not take her insulin if she has not checked her sugar  Also appears that if her blood sugar is relatively low before eating she will not take any insulin until the blood sugar is going up higher later  This causes her blood sugar to be swinging from high to low  HIGHEST blood sugars on average are in the afternoons and early evening  Her A1c appears to be lower than expected for her blood sugars which are now averaging 193 recently     Meals: 3 meals per day.  Breakfast is usually around 8 AM, lunch between 1-2  PM , and dinner between 6 to 8 PM.  Oral hypoglycemic drugs:  none        Side effects from medications: None         Proper timing of medications in relation to meals: Yes.          Monitors blood glucose: about 3 times a day    Glucometer: One Touch.          Blood  Glucose readings   Mean values apply above for all meters except median for One Touch  PRE-MEAL Fasting  2-5 PM  Dinner Bedtime Overall  Glucose range:  66-429   52-329   60-367   70-393    Mean/median:  165   213    232   178+/-103                  Physical activity: exercise: Unable to do any           Weight control:  Wt Readings from Last 3 Encounters:  08/26/15 297 lb (134.718 kg)  06/10/15 297 lb (134.718 kg)  05/17/15 287 lb 8 oz (130.409 kg)           Diabetes labs:  Lab Results  Component Value Date   HGBA1C 6.5 06/10/2015   HGBA1C 6.7* 02/18/2015   HGBA1C 9.1* 10/01/2014   Lab Results  Component Value Date   MICROALBUR 2.1* 09/14/2015   [place] 94 09/14/2015   CREATININE 0.81 09/14/2015       Medication List       This  list is accurate as of: 09/14/15  4:12 PM.  Always use your most recent med list.               AMBULATORY NON FORMULARY MEDICATION  Medication Name: Nitroglycerin ointment 0.125% Apply pea-size amount internally 4 times daily     anastrozole 1 MG tablet  Commonly known as:  ARIMIDEX  Take 1 tablet (1 mg total) by mouth daily.     aspirin 81 MG tablet  Take 81 mg by mouth daily.     b complex vitamins capsule  Take 1 capsule by mouth daily.     buPROPion 300 MG 24 hr tablet  Commonly known as:  WELLBUTRIN XL  Take 1 tablet by mouth Daily.     calcium carbonate 600 MG Tabs tablet  Commonly known as:  OS-CAL  Take 1,200 mg by mouth.     Cinnamon 500 MG capsule  Take 500 mg by mouth daily.     citalopram 20 MG tablet  Commonly known as:  CELEXA  Take 1 tablet by mouth daily.     DULoxetine 30 MG capsule  Commonly known as:  CYMBALTA  Take 30 mg by mouth daily.     DULoxetine 60 MG capsule  Commonly known as:  CYMBALTA  Take 60 mg by mouth daily.     EQL OMEGA 3 FISH OIL 1400 MG Caps  Take 1 capsule by mouth daily.     fluticasone 50 MCG/ACT nasal spray  Commonly known as:  FLONASE  Frequency:PRN   Dosage:50   MCG  Instructions:  Note:Dose: 50 MCG     furosemide 40 MG tablet  Commonly known as:  LASIX  Take 20 mg by mouth 2 (two) times daily.     gabapentin 800 MG tablet  Commonly known as:  NEURONTIN  800 mg 3 (three) times daily.     GENADUR Liqd  Apply 1 application topically daily.     glucagon 1 MG injection  Commonly known as:  GLUCAGON EMERGENCY  Inject 1 mg into the vein once as needed.     glucose blood test strip  Use as instructed to check blood sugar 4 times per day Dx: E11.65     Grape Seed Extract 50 MG Caps  Take by mouth daily.     hydrocortisone-pramoxine 2.5-1 % rectal cream  Commonly known as:  ANALPRAM  HC  Place rectally as needed for hemorrhoids.     insulin regular human CONCENTRATED 500 UNIT/ML injection  Commonly known as:   HUMULIN R  Use three times daily with meals per sliding scale. Max dose 80 units per day. This is equivalent to 400 units of U-100 insulin per day.     Insulin Syringe-Needle U-100 31G X 5/16" 0.3 ML Misc  Commonly known as:  B-D INSULIN SYRINGE  Use three per day to inject insulin     JUBLIA 10 % Soln  Generic drug:  Efinaconazole  Apply 1 application topically daily.     lactulose 20 G packet  Commonly known as:  CEPHULAC  Take 20 g by mouth 3 (three) times daily.     lidocaine-hydrocortisone 3-0.5 % Crea  Commonly known as:  ANAMANTEL HC  Use small application of cream 4 times daily for 2 weeks then as needed.     lisinopril 20 MG tablet  Commonly known as:  PRINIVIL,ZESTRIL  Take 20 mg by mouth every morning. Take in the morning     loratadine 10 MG tablet  Commonly known as:  CLARITIN  Take 10 mg by mouth daily.     Magnesium Oxide 400 MG Caps  Take 400 mg by mouth daily.     meclizine 12.5 MG tablet  Commonly known as:  ANTIVERT  Take 12.5 mg by mouth 3 (three) times daily as needed.     Milk Thistle 500 MG Caps  Take 1,000 mg by mouth daily.     MULTIPLE VITAMIN PO  Take 1 tablet by mouth daily.     ONETOUCH DELICA LANCETS 95A Misc  Use to test sugars 4 x daily.     pantoprazole 40 MG tablet  Commonly known as:  PROTONIX  Take 40 mg by mouth at bedtime.     polyethylene glycol packet  Commonly known as:  MIRALAX / GLYCOLAX  Take 17 g by mouth daily.     pramipexole 1 MG tablet  Commonly known as:  MIRAPEX  Take 2 mg by mouth at bedtime.     propranolol ER 60 MG 24 hr capsule  Commonly known as:  INDERAL LA  Take 60 mg by mouth daily. Take at night     spironolactone 50 MG tablet  Commonly known as:  ALDACTONE  Take 50 mg by mouth 2 (two) times daily.     traMADol 50 MG tablet  Commonly known as:  ULTRAM  Take 1 tablet (50 mg total) by mouth every 6 (six) hours as needed for moderate pain or severe pain.     XIFAXAN 550 MG Tabs tablet    Generic drug:  rifaximin  Take 1 tablet by mouth 2 (two) times daily.        Allergies:  Allergies  Allergen Reactions  . Other Rash    Blisters Per Patient - she has been told she can not have general anesthesia "unless life-threatening"  . Tape   . Oxymorphone     lethargic  . Barbiturates Dermatitis and Other (See Comments)    Drowsiness, patient states that Barbiturates & Narcotics make her extremely sleepy and drowsy 24 Apr 2013 Drowsiness  "sleeps forever"    Past Medical History  Diagnosis Date  . Cirrhosis ([place])   . Multiple thyroid nodules   . History of stomach ulcers   . Hernia   . Colon polyps 12/02/2012    TUBULAR ADENOMAS (X2) and Hyperplastic (X1)  . Neuropathy of foot   .  Restless legs   . Complication of anesthesia     WITH SECON HERNIA REPAIR 2012 [facility]  DIFFICULTY O2 SAT DROPPING ADMIT TO HOSPITAL   . Bundle branch block   . Hypertension   . Angina     COSTOCONDRITIS    . Shortness of breath     WITH EXERTION   . Diabetes mellitus   . Hepatitis      Did Not have hepatitis but needed Hep A and Hep B injections2012 SPOTS ON LIVER  DR Alonza Bogus  [phone]  . GERD (gastroesophageal reflux disease)     ULCERS  . Hyperthyroidism     NODULES ON THYROID  (DR BALEN 37  12-609)  . Goiter   . Headache(784.0)     MIGRAINES  . Neuromuscular disorder ([place])     PERIPH NEUROPATHY   . Anxiety   . Depression   . Breast cancer [place]) 01/19/12     right breast lumpectomy/ER/PR positibe,her-2 neg.  . Allergy     tape  . S/P radiation therapy 02/19/12 - 04/01/12    Right Breast: 5000 cgy/25 Fractions with Boost/ 1000 cGy/5 Fractions  . Iron deficiency anemia 10/16/2012  . Gastritis   . External hemorrhoids   . Diverticulosis   . Sleep apnea   . Bell's palsy 2014    twice  . Heart murmur   . Breast cancer ([place]) 2011  . Short of breath on exertion     Past Surgical History  Procedure Laterality Date  . Appendectomy    . Tonsillectomy    .  Cesarean section    . Tubal ligation    . Abdominal hysterectomy    . Back surgery    . Hernia repair      X 2  . Anal fissure repair    . Carpal tunnel release      LEFT   . Colonoscopy w/ polypectomy  12/02/2012  . Breast surgery      BIOPSY  Right Breast -  Sentinel Lymph Nodes  . Liver biopsy  03/28/11    [facility] - Cirrhosis  . Spinal fusion    . Esophagogastroduodenoscopy  12/02/2012    Gastritis  . Hand surgery Bilateral     Family History  Problem Relation Age of Onset  . Lung cancer Maternal Grandfather   . Colon cancer Paternal Grandfather   . Diabetes Mother   . Hypertension Mother   . Heart disease Maternal Uncle   . Diabetes Sister   . Heart disease Sister   . Thyroid disease Sister   . Heart disease Father   . Hypertension Father   . Diabetes Daughter   . Hypertension Other   . Heart disease Other     Social History:  reports that she has never smoked. She has never used smokeless tobacco. She reports that she does not drink alcohol or use illicit drugs.  Review of Systems:  The following is a copy from previous record:  Edema legs has been  controlled with diuretics including Lasix and Aldactone  Cirrhosis of liver, long-standing and followed at [facility].  Has significant hepatic insufficiency and is taking lactulose Also on milk thistle.  Recent ALT slightly better  Lab Results  Component Value Date   ALT 31 06/10/2015   ALT 44 01/06/2015    Hypertension:  treated with lisinopril 20 mg   Lipids: She has not been on medication because of abnormal liver functions  Lab Results  Component Value  Date   CHOL 148 09/14/2015   HDL 41.50 09/14/2015   LDLCALC 94 09/14/2015   TRIG 63.0 09/14/2015   CHOLHDL 4 09/14/2015   NEUROPATHY: Has numbness in his legs extending up to the middle of the lower leg Symptoms controlled with gabapentin and Cymbalta   Last foot exam in 6/16 showed the following:  absent Normal Monofilament  testing bilaterally ( can feel at few places on left sole). +deformities of toes. Nails dystrophic. Skin normal color. Dry skin. Preulcerative callus right medial foot, with calluses bilaterally  Reportedly also has Charcot foot   ROS     Examination:   BP 104/60 mmHg  Pulse 61  Temp(Src) 98.5 F (36.9 C)  Resp 16  Ht 5' 3.75" (1.619 m)  Wt   SpO2 96%  There is no weight on file to calculate BMI.     ASSESSMENT/ PLAN:    Diabetes type 2:  Blood glucose control is again very erratic with her glucose fluctuating 9 standard deviation recently 103 See history of present illness for detailed discussion of his current management, blood sugar patterns and problems identified  As discussed above she has had variability in blood sugars at all times and her compliance with insulin and her day-to-day regimen is suboptimal Since she is fearful of hypoglycemia she does not take her premixed insulin when blood sugar is low normal especially at suppertime and this causes hyperglycemia. Occasional hypoglycemia eyes caused by taking her evening insulin at bedtime Also has occasional low sugars related to overcorrection of high readings  Also has REBOUND from low to high readings  Again she is wanting to discontinue her U-500 insulin after her supply runs out because of the high cost of the medication Avonex dear  Discussed that the best option for the patient would be to use an insulin pump as this would be more effective in controlling her blood sugars and reduced variability and hypoglycemia overnight.  She could  use Novolin R in the pump Discussed basic principles of the insulin pump with the nurse educator today Today will need to have a C-peptide done to verify that she is eligible for  Other option would be to have her take NPH and Regular Insulin and discussed that this may involve 5 injections  For now will continue her U-500 insulin before meals but increase her dose in the  morning, reduce it slightly at lunch and make sure she takes her insulin before supper; she can take it right after eating if blood sugars are near-normal at suppertime and no insulin at bedtime  Counseling time on subjects discussed above is over 50% of today's 25 minute visit  Patient Instructions  Humulin U-500 at meals 3 times a day:   BASE DOSES 33 AT BREAKFAST; 28 at lunch if eating but 16 if not eating and 6 at supper  Correction doses: 151-200 = +1   201-250 =+2    251-300 =+3    301-350 =+5   351+   =+7  If sugar <90 reduce by 2 units  Always take insulin before meal, after eating if <80      Verne Lanuza 09/14/2015, 4:12 PM

## 2015-09-15 LAB — C-PEPTIDE: C PEPTIDE: 0.93 ng/mL (ref 0.80–3.90)

## 2015-09-15 NOTE — Progress Notes (Signed)
Quick Note:  Please let patient know that the insulin level is low and she can qualify for insulin pump if she wants. Cholesterol is better ______

## 2015-11-02 ENCOUNTER — Telehealth: Payer: Self-pay | Admitting: *Deleted

## 2015-11-02 NOTE — Telephone Encounter (Signed)
I have left a voicemail for patient to call back. 

## 2015-11-02 NOTE — Telephone Encounter (Signed)
As long as she is following somewhere with gastroenterology for her cirrhosis this is sufficient

## 2015-11-02 NOTE — Telephone Encounter (Signed)
Called patient to schedule her for follow up with Dr Hilarie Fredrickson for cirrhosis. Patient states she sees Dr Gertie Fey @ [facility] for her "liver". Dr Hilarie Fredrickson, does she need to be seen both here and [facility] or does she just need follow up at [facility]?

## 2015-11-02 NOTE — Telephone Encounter (Signed)
-----   Message from Jerene Bears, MD sent at 11/02/2015 10:07 AM EST ----- This came today from [place] for follow-up cirrhosis I believe JMP ----- Message -----    From: Oda Kilts, CMA    Sent: 11/02/2015      To: Jerene Bears, MD    ----- Message -----    From: Jerene Bears, MD    Sent: 05/17/2015   6:41 PM      To: Oda Kilts, CMA  6 month OV for followup of cirrhosis JMP

## 2015-11-02 NOTE — Telephone Encounter (Signed)
Patient is advised of Dr Vena Rua recommendation. She verbalizes understanding. She will follow up at [facility] for her liver and would like to continue her care here for her other GI concerns.

## 2015-11-15 ENCOUNTER — Ambulatory Visit: Payer: PRIVATE HEALTH INSURANCE | Admitting: Endocrinology

## 2015-12-28 ENCOUNTER — Ambulatory Visit: Payer: PRIVATE HEALTH INSURANCE | Admitting: Internal Medicine

## 2016-01-13 ENCOUNTER — Other Ambulatory Visit: Payer: BLUE CROSS/BLUE SHIELD

## 2016-01-13 ENCOUNTER — Ambulatory Visit: Payer: BLUE CROSS/BLUE SHIELD

## 2016-01-13 ENCOUNTER — Ambulatory Visit: Payer: BLUE CROSS/BLUE SHIELD | Admitting: Oncology

## 2016-01-19 ENCOUNTER — Other Ambulatory Visit: Payer: Self-pay

## 2016-01-19 DIAGNOSIS — C50419 Malignant neoplasm of upper-outer quadrant of unspecified female breast: Secondary | ICD-10-CM

## 2016-01-20 ENCOUNTER — Other Ambulatory Visit (HOSPITAL_BASED_OUTPATIENT_CLINIC_OR_DEPARTMENT_OTHER): Payer: Medicare Other

## 2016-01-20 ENCOUNTER — Ambulatory Visit (HOSPITAL_BASED_OUTPATIENT_CLINIC_OR_DEPARTMENT_OTHER): Payer: Medicare Other | Admitting: Oncology

## 2016-01-20 ENCOUNTER — Other Ambulatory Visit: Payer: Self-pay

## 2016-01-20 ENCOUNTER — Telehealth: Payer: Self-pay | Admitting: Oncology

## 2016-01-20 ENCOUNTER — Ambulatory Visit (HOSPITAL_BASED_OUTPATIENT_CLINIC_OR_DEPARTMENT_OTHER): Payer: Medicare Other

## 2016-01-20 VITALS — BP 126/48 | HR 88 | Temp 98.4°F

## 2016-01-20 VITALS — BP 136/50 | HR 90 | Temp 97.9°F | Resp 18

## 2016-01-20 DIAGNOSIS — D509 Iron deficiency anemia, unspecified: Secondary | ICD-10-CM

## 2016-01-20 DIAGNOSIS — C50411 Malignant neoplasm of upper-outer quadrant of right female breast: Secondary | ICD-10-CM

## 2016-01-20 DIAGNOSIS — C50419 Malignant neoplasm of upper-outer quadrant of unspecified female breast: Secondary | ICD-10-CM

## 2016-01-20 DIAGNOSIS — Z853 Personal history of malignant neoplasm of breast: Secondary | ICD-10-CM

## 2016-01-20 DIAGNOSIS — M859 Disorder of bone density and structure, unspecified: Secondary | ICD-10-CM | POA: Diagnosis not present

## 2016-01-20 DIAGNOSIS — C50911 Malignant neoplasm of unspecified site of right female breast: Secondary | ICD-10-CM

## 2016-01-20 DIAGNOSIS — M8580 Other specified disorders of bone density and structure, unspecified site: Secondary | ICD-10-CM

## 2016-01-20 LAB — CBC WITH DIFFERENTIAL/PLATELET
BASO%: 0.4 % (ref 0.0–2.0)
Basophils Absolute: 0 10*3/uL (ref 0.0–0.1)
EOS%: 1.2 % (ref 0.0–7.0)
Eosinophils Absolute: 0 10*3/uL (ref 0.0–0.5)
HEMATOCRIT: 35.3 % (ref 34.8–46.6)
HGB: 11.5 g/dL — ABNORMAL LOW (ref 11.6–15.9)
LYMPH%: 26.7 % (ref 14.0–49.7)
MCH: 29 pg (ref 25.1–34.0)
MCHC: 32.6 g/dL (ref 31.5–36.0)
MCV: 88.9 fL (ref 79.5–101.0)
MONO#: 0.3 10*3/uL (ref 0.1–0.9)
MONO%: 10.9 % (ref 0.0–14.0)
NEUT%: 60.8 % (ref 38.4–76.8)
NEUTROS ABS: 1.5 10*3/uL (ref 1.5–6.5)
PLATELETS: 65 10*3/uL — AB (ref 145–400)
RBC: 3.97 10*6/uL (ref 3.70–5.45)
RDW: 15.5 % — ABNORMAL HIGH (ref 11.2–14.5)
WBC: 2.5 10*3/uL — AB (ref 3.9–10.3)
lymph#: 0.7 10*3/uL — ABNORMAL LOW (ref 0.9–3.3)

## 2016-01-20 LAB — COMPREHENSIVE METABOLIC PANEL
ALT: 46 U/L (ref 0–55)
ANION GAP: 8 meq/L (ref 3–11)
AST: 81 U/L — ABNORMAL HIGH (ref 5–34)
Albumin: 2.8 g/dL — ABNORMAL LOW (ref 3.5–5.0)
Alkaline Phosphatase: 261 U/L — ABNORMAL HIGH (ref 40–150)
BILIRUBIN TOTAL: 1.21 mg/dL — AB (ref 0.20–1.20)
BUN: 11.6 mg/dL (ref 7.0–26.0)
CALCIUM: 8.6 mg/dL (ref 8.4–10.4)
CO2: 25 mEq/L (ref 22–29)
CREATININE: 0.9 mg/dL (ref 0.6–1.1)
Chloride: 104 mEq/L (ref 98–109)
EGFR: 70 mL/min/{1.73_m2} — ABNORMAL LOW (ref >90)
Glucose: 257 mg/dl — ABNORMAL HIGH (ref 70–140)
Potassium: 4.5 mEq/L (ref 3.5–5.1)
Sodium: 137 mEq/L (ref 136–145)
TOTAL PROTEIN: 6.9 g/dL (ref 6.4–8.3)

## 2016-01-20 MED ORDER — ZOLEDRONIC ACID 4 MG/100ML IV SOLN
4.0000 mg | Freq: Once | INTRAVENOUS | Status: AC
  Administered 2016-01-20: 4 mg via INTRAVENOUS
  Filled 2016-01-20: qty 100

## 2016-01-20 MED ORDER — ANASTROZOLE 1 MG PO TABS: 1.0000 mg | ORAL_TABLET | Freq: Every day | ORAL | Status: DC

## 2016-01-20 NOTE — Progress Notes (Signed)
ID: Daisy Larson   DOB: 10-17-1951  MR#: [id number]  CSN#:[id number]  PCP:  [name], MD GYN:   SUR:  Madilyn Hook, OD RADONC:  Eppie Gibson, MD OTHER:  Bevelyn Buckles, MD;  Kara Mead, MD  CHIEF COMPLAINT:  Hs of Right Breast Cancer  CURRENT TREATMENT: anastrozole daily   BREAST CANCER HISTORY: From the original intake note:  The patient had annual mammography 12/29/2011 at [facility], showing  an 11 mm focal nodular density in the upper outer quadrant of the right breast which had increased in size as compared to the prior study a year prior. Ultrasound showed no sonographic abnormality. 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 The patient underwent bilateral breast MRI 01/09/2012 showing a solitary enhancing mass in the lateral aspect of the right breast measuring 1.3 cm. There was no enlarged axillary or internal mammary adenopathy and no other abnormality was noted.  The patient was seen at the multidisciplinary breast cancer clinic 01/10/2012 with further treatments as detailed below  INTERVAL HISTORY:  Daisy Larson returns for follow up of her estrogen receptor positive breast cancer, accompanied by her husband Vicente Serene. From a breast cancer point of view she is doing well. She is taking anastrozole with no side effects that she is aware of. They obtained that at a good price.  REVIEW OF SYSTEMS:  Everything else, however, according to Daisy Larson, is "going to pop". 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 She has significant pain secondary to this. She treats 7 with tramadol.  This does not cause constipation because she is on lactulose and MiraLAX as well as rifaximin for her encephalopathy. She is very fatigued has some sinus problems and hoarseness, her ankles swell, and she has chronic back pain which is however not more intense or persistent than before. She does not use of wheelchair at home although she is in a wheelchair today here. She has occasional headaches. She feels forgetful, anxious and depressed, although she hasn't 3 different antidepressants. Her husband, who himself is status post a heart transplant and several strokes is her primary caregiver and really she could not be home if it were not for his constant care. A detailed review of systems today was otherwise stable  PAST MEDICAL HISTORY: Past Medical History  Diagnosis Date  . Cirrhosis ([place])   . Multiple thyroid nodules   . History of stomach ulcers   . Hernia   . Colon polyps 12/02/2012    TUBULAR ADENOMAS (X2) and Hyperplastic (X1)  . Neuropathy of foot   . Restless legs   . Complication of anesthesia     WITH SECON HERNIA REPAIR 2012 [facility]  DIFFICULTY O2 SAT DROPPING ADMIT TO HOSPITAL   . Bundle branch block   . Hypertension   . Angina     COSTOCONDRITIS    . Shortness of breath     WITH EXERTION   . Diabetes mellitus   . Hepatitis      Did Not have hepatitis but needed Hep A and Hep B injections2012 SPOTS ON LIVER  DR Alonza Bogus  [phone]  . GERD (gastroesophageal reflux disease)  ULCERS  . Hyperthyroidism     NODULES ON THYROID  (DR BALEN 37  12-609)  . Goiter   . Headache(784.0)     MIGRAINES  . Neuromuscular disorder ([place])     PERIPH NEUROPATHY   . Anxiety   . Depression   . Breast cancer ([place]) 01/19/12     right breast lumpectomy/ER/PR positibe,her-2 neg.  . Allergy     tape  . S/P radiation therapy 02/19/12 - 04/01/12    Right Breast: 5000 cgy/25 Fractions with Boost/ 1000 cGy/5 Fractions  . Iron deficiency anemia 10/16/2012  . Gastritis   . External  hemorrhoids   . Diverticulosis   . Sleep apnea   . Bell's palsy 2014    twice  . Heart murmur   . Breast cancer ([place]) 2011  . Short of breath on exertion   She underwent liver biopsy 03/28/2011, at [facility], showing evidence of early cirrhosis. There was mildly active chronic hepatitis (grade 1). She also has a history of thyroid nodules which have been stable on serial ultrasounds according to the patient  PAST SURGICAL HISTORY: Past Surgical History  Procedure Laterality Date  . Appendectomy    . Tonsillectomy    . Cesarean section    . Tubal ligation    . Abdominal hysterectomy    . Back surgery    . Hernia repair      X 2  . Anal fissure repair    . Carpal tunnel release      LEFT   . Colonoscopy w/ polypectomy  12/02/2012  . Breast surgery      BIOPSY  Right Breast -  Sentinel Lymph Nodes  . Liver biopsy  03/28/11    [facility] - Cirrhosis  . Spinal fusion    . Esophagogastroduodenoscopy  12/02/2012    Gastritis  . Hand surgery Bilateral     FAMILY HISTORY Family History  Problem Relation Age of Onset  . Lung cancer Maternal Grandfather   . Colon cancer Paternal Grandfather   . Diabetes Mother   . Hypertension Mother   . Heart disease Maternal Uncle   . Diabetes Sister   . Heart disease Sister   . Thyroid disease Sister   . Heart disease Father   . Hypertension Father   . Diabetes Daughter   . Hypertension Other   . Heart disease Other    the patient's father died at the age of 29, following a fall. The patient's mother died at the age of 28. 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 The patient had no brothers, 3 sisters, one of whom is present at the initial visit. There is no history of breast or ovarian cancer in the family, but one niece was diagnosed with colon cancer at the age of 60. Her paternal grandfather died from colon cancer. Her maternal grandfather died from lung cancer.  GYNECOLOGIC HISTORY: She had  menarche at age 27. She went through menopause approximately age 41. She took hormone replacement on total recently. She is GX P2, first pregnancy to term age 40.  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 Her husband of 40+ years, Vicente Serene, is a Theme park manager. He underwent heart transplant at [facility] at May 2011 and is doing very well except for pain from costochondritis. Their children are Marquia Costello who lives in [place] and is a Geophysicist/field seismologist, and [employer],  also living in [place], who works as a Physiological scientist. The patient has one grandchild. She attends an independent [employer]: in place  HEALTH MAINTENANCE: (Updated January 2015) Social History  Substance Use Topics  . Smoking status: Never Smoker   . Smokeless tobacco: Never Used  . Alcohol Use: No     Colonoscopy: 2014, Dr. Sharlett Iles  PAP: November 2014  Bone density: June 2013 at [facility], osteopenia with a T    score of -1.9  Lipid panel:  Not on file, Dr. Eulas Post  Allergies  Allergen Reactions  . Other Rash    Blisters Per Patient - she has been told she can not have general anesthesia "unless life-threatening"  . Tape   . Oxymorphone     lethargic  . Barbiturates Dermatitis and Other (See Comments)    Drowsiness, patient states that Barbiturates & Narcotics make her extremely sleepy and drowsy 24 Apr 2013 Drowsiness  "sleeps forever"    Current Outpatient Prescriptions  Medication Sig Dispense Refill  . AMBULATORY NON FORMULARY MEDICATION Medication Name: Nitroglycerin ointment 0.125% Apply pea-size amount internally 4 times daily 60 g 0  . anastrozole (ARIMIDEX) 1 MG tablet Take 1 tablet (1 mg total) by mouth daily. 90 tablet 2  . aspirin 81 MG tablet Take 81 mg by mouth daily.    Marland Kitchen b complex vitamins capsule Take 1 capsule by mouth daily.     Marland Kitchen buPROPion (WELLBUTRIN XL) 300  MG 24 hr tablet Take 1 tablet by mouth Daily.    . calcium carbonate (OS-CAL) 600 MG TABS tablet Take 1,200 mg by mouth.    . Cinnamon 500 MG capsule Take 500 mg by mouth daily.     . citalopram (CELEXA) 20 MG tablet Take 1 tablet by mouth daily.    . DULoxetine (CYMBALTA) 60 MG capsule Take 60 mg by mouth daily.    . fluticasone (FLONASE) 50 MCG/ACT nasal spray Frequency:PRN   Dosage:50   MCG  Instructions:  Note:Dose: 50 MCG    . furosemide (LASIX) 40 MG tablet Take 20 mg by mouth 2 (two) times daily.     Marland Kitchen gabapentin (NEURONTIN) 800 MG tablet 800 mg 3 (three) times daily.     Marland Kitchen glucagon (GLUCAGON EMERGENCY) 1 MG injection Inject 1 mg into the vein once as needed. 1 each 1  . glucose blood test strip Use as instructed to check blood sugar 4 times per day Dx: E11.65 400 each 1  . Grape Seed Extract 50 MG CAPS Take by mouth daily.     . insulin regular human CONCENTRATED (HUMULIN R) 500 UNIT/ML SOLN injection Use three times daily with meals per sliding scale. Max dose 80 units per day. This is equivalent to 400 units of U-100 insulin per day. 60 mL 1  . Insulin Syringe-Needle U-100 (B-D INSULIN SYRINGE) 31G X 5/16" 0.3 ML MISC Use three per day to inject insulin 300 each 1  . lactulose (CEPHULAC) 20 G packet Take 20 g by mouth 3 (three) times daily.    Marland Kitchen lisinopril (PRINIVIL,ZESTRIL) 20 MG tablet Take 20 mg by mouth every morning. Take in the morning    . loratadine (CLARITIN) 10 MG tablet Take 10 mg by mouth daily.    . Magnesium Oxide 400 MG CAPS Take 400 mg by mouth daily.     . meclizine (ANTIVERT) 12.5 MG tablet Take 12.5 mg by mouth 3 (three) times daily as needed.    Marland Kitchen  Milk Thistle 500 MG CAPS Take 1,000 mg by mouth daily.     . MULTIPLE VITAMIN PO Take 1 tablet by mouth daily.    . Omega-3 Fatty Acids (EQL OMEGA 3 FISH OIL) 1400 MG CAPS Take 1 capsule by mouth daily.    Glory Rosebush DELICA LANCETS 65L MISC Use to test sugars 4 x daily. 100 each 11  . pantoprazole (PROTONIX) 40 MG tablet  Take 40 mg by mouth at bedtime.     . polyethylene glycol (MIRALAX / GLYCOLAX) packet Take 17 g by mouth daily.    . pramipexole (MIRAPEX) 1 MG tablet Take 2 mg by mouth at bedtime.     . propranolol (INDERAL LA) 60 MG 24 hr capsule Take 60 mg by mouth daily. Take at night    . spironolactone (ALDACTONE) 50 MG tablet Take 50 mg by mouth 2 (two) times daily.     . traMADol (ULTRAM) 50 MG tablet Take 1 tablet (50 mg total) by mouth every 6 (six) hours as needed for moderate pain or severe pain. 15 tablet 0  . XIFAXAN 550 MG TABS tablet Take 1 tablet by mouth 2 (two) times daily.     No current facility-administered medications for this visit.    OBJECTIVE: Middle-aged white woman examined in a wheelchair Filed Vitals:   01/20/16 1411  BP: 136/50  Pulse: 90  Temp: 97.9 F (36.6 C)  Resp: 18     There is no weight on file to calculate BMI.    ECOG FS: 2 Filed Weights    Sclerae unicteric, EOMs intact Oropharynx clear, no thrush or other lesions No cervical or supraclavicular adenopathy Lungs no rales or rhonchi Heart regular rate and rhythm Abd soft, obese, nontender, positive bowel sounds MSK no focal spinal tenderness, no upper extremity lymphedema Neuro: nonfocal, well oriented, friendly affect Breasts: The right breast is status post lumpectomy and radiation. There is no evidence of local recurrence. Right axilla is benign. The left breast is unremarkable    LAB RESULTS: Lab Results  Component Value Date   WBC 2.5* 01/20/2016   NEUTROABS 1.5 01/20/2016   HGB 11.5* 01/20/2016   HCT 35.3 01/20/2016   MCV 88.9 01/20/2016   PLT 65* 01/20/2016      Chemistry      Component Value Date/Time   NA 137 01/20/2016 1335   NA 141 09/14/2015 0808   NA 135* 09/22/2014   K 4.5 01/20/2016 1335   K 4.8 09/14/2015 0808   CL 102 09/14/2015 0808   CL 81* 02/25/2013 1300   CO2 25 01/20/2016 1335   CO2 35* 09/14/2015 0808   BUN 11.6 01/20/2016 1335   BUN 12 09/14/2015 0808   BUN  18 09/22/2014   CREATININE 0.9 01/20/2016 1335   CREATININE 0.81 09/14/2015 0808   CREATININE 1.4* 09/22/2014   GLU 360 09/22/2014      Component Value Date/Time   CALCIUM 8.6 01/20/2016 1335   CALCIUM 9.4 09/14/2015 0808   ALKPHOS 261* 01/20/2016 1335   ALKPHOS 186* 06/10/2015 1158   AST 81* 01/20/2016 1335   AST 41* 06/10/2015 1158   ALT 46 01/20/2016 1335   ALT 31 06/10/2015 1158   BILITOT 1.21* 01/20/2016 1335   BILITOT 1.1 06/10/2015 1158        STUDIES: Mammography at [facility] February 2017 reportedly benign. Left  ASSESSMENT: 65 y.o[place]   (1) status post right lumpectomy 01/19/2012 for a T1c N0, stage IA invasive mucinous carcinoma,  grade 1, estrogen 99% and progesterone 100% receptor positive, with an MIB of 8, and no HER-2 amplification.  (2) completed radiation 04/01/2012  (3) on anastrozole since June 2013  (4)  iron deficiency anemia, s/p ferumoxytol x2 December 2013  (5) osteopenia, L fem neck T -1.9 on June 2013.  Receives zoledronic acid on an annual basis, first dose in February 2014   (6)  Multiple comorbitities including uncontrolled diabetes, cirrhosis, and hypertension.  PLAN:  There is having significant problems related to her diabetes and cirrhosis. Likely this has not yet affected her kidneys and she has a good creatinine clearance today. Accordingly we are proceeding with her zolendronate. This is keeping her bone density at least stable.  We reviewed her labs. I think the low white count and platelet count is likely due to splenomegaly secondary to cirrhosis. We reviewed the circulation relevant to this. I don't think we need to make any changes from my point of view because of this.  She is going to see me one more time a year from now and assuming all is stable she will "graduate" from breast cancer follow-up.  Daisy Larson knows to call for any problems that may develop before that visit. Chauncey Cruel, MD   01/20/2016

## 2016-01-20 NOTE — Patient Instructions (Signed)

## 2016-01-20 NOTE — Telephone Encounter (Signed)
Gave patient avs report and appointments for March 2018.  °

## 2016-01-24 MED ORDER — ANASTROZOLE 1 MG PO TABS: 1.0000 mg | ORAL_TABLET | Freq: Every day | ORAL | Status: DC

## 2016-01-25 MED ORDER — ANASTROZOLE 1 MG PO TABS: 1.0000 mg | ORAL_TABLET | Freq: Every day | ORAL | Status: DC

## 2016-01-25 NOTE — Addendum Note (Signed)
Addended by: Cherylynn Ridges on: 01/25/2016 03:03 PM   Modules accepted: Orders

## 2016-01-25 NOTE — Telephone Encounter (Signed)
This nurse observed printed script.  Called patient to determine if mail order or retail.  Will send this request eRx to [facility] per patient request.

## 2016-05-22 ENCOUNTER — Other Ambulatory Visit: Payer: Self-pay | Admitting: Endocrinology

## 2016-05-25 ENCOUNTER — Telehealth: Payer: Self-pay | Admitting: Endocrinology

## 2016-06-13 ENCOUNTER — Encounter: Payer: Self-pay | Admitting: Oncology

## 2017-01-10 ENCOUNTER — Other Ambulatory Visit: Payer: Self-pay | Admitting: Endocrinology

## 2017-01-17 ENCOUNTER — Other Ambulatory Visit: Payer: Self-pay | Admitting: Adult Health

## 2017-01-17 DIAGNOSIS — C50411 Malignant neoplasm of upper-outer quadrant of right female breast: Secondary | ICD-10-CM

## 2017-01-17 DIAGNOSIS — Z170 Estrogen receptor positive status [ER+]: Principal | ICD-10-CM

## 2017-01-18 ENCOUNTER — Telehealth: Payer: Self-pay | Admitting: *Deleted

## 2017-01-18 ENCOUNTER — Ambulatory Visit: Payer: Medicare Other

## 2017-01-18 ENCOUNTER — Encounter: Payer: Self-pay | Admitting: Oncology

## 2017-01-18 ENCOUNTER — Ambulatory Visit: Payer: Medicare Other | Admitting: Oncology

## 2017-01-18 ENCOUNTER — Telehealth: Payer: Self-pay | Admitting: Oncology

## 2017-01-18 ENCOUNTER — Other Ambulatory Visit: Payer: Medicare Other

## 2017-01-18 NOTE — Telephone Encounter (Signed)
This RN received call from pt's daughter stating need to cancel appointment scheduled for today due to pt currently in the ICU at [facility] for " kidney failure ".  This RN sent inbox message to reschedule and will inform MD.

## 2017-01-18 NOTE — Telephone Encounter (Signed)
Left message re 5/3 appointments. Schedule mailed.

## 2017-02-02 ENCOUNTER — Other Ambulatory Visit: Payer: Self-pay | Admitting: *Deleted

## 2017-03-01 ENCOUNTER — Other Ambulatory Visit (HOSPITAL_BASED_OUTPATIENT_CLINIC_OR_DEPARTMENT_OTHER): Payer: Medicare Other

## 2017-03-01 ENCOUNTER — Ambulatory Visit (HOSPITAL_BASED_OUTPATIENT_CLINIC_OR_DEPARTMENT_OTHER): Payer: Medicare Other

## 2017-03-01 ENCOUNTER — Ambulatory Visit (HOSPITAL_BASED_OUTPATIENT_CLINIC_OR_DEPARTMENT_OTHER): Payer: Medicare Other | Admitting: Oncology

## 2017-03-01 VITALS — BP 122/37 | HR 69 | Temp 97.8°F | Resp 18 | Ht 63.75 in | Wt 271.2 lb

## 2017-03-01 DIAGNOSIS — Z853 Personal history of malignant neoplasm of breast: Secondary | ICD-10-CM

## 2017-03-01 DIAGNOSIS — M8580 Other specified disorders of bone density and structure, unspecified site: Secondary | ICD-10-CM | POA: Diagnosis present

## 2017-03-01 DIAGNOSIS — Z170 Estrogen receptor positive status [ER+]: Principal | ICD-10-CM

## 2017-03-01 DIAGNOSIS — M859 Disorder of bone density and structure, unspecified: Secondary | ICD-10-CM

## 2017-03-01 DIAGNOSIS — C50411 Malignant neoplasm of upper-outer quadrant of right female breast: Secondary | ICD-10-CM

## 2017-03-01 LAB — COMPREHENSIVE METABOLIC PANEL
ALT: 55 U/L (ref 0–55)
AST: 75 U/L — AB (ref 5–34)
Albumin: 2.9 g/dL — ABNORMAL LOW (ref 3.5–5.0)
Alkaline Phosphatase: 218 U/L — ABNORMAL HIGH (ref 40–150)
Anion Gap: 4 mEq/L (ref 3–11)
BUN: 22.4 mg/dL (ref 7.0–26.0)
CHLORIDE: 110 meq/L — AB (ref 98–109)
CO2: 24 mEq/L (ref 22–29)
Calcium: 9.3 mg/dL (ref 8.4–10.4)
Creatinine: 1.3 mg/dL — ABNORMAL HIGH (ref 0.6–1.1)
EGFR: 43 mL/min/{1.73_m2} — ABNORMAL LOW (ref >90)
GLUCOSE: 200 mg/dL — AB (ref 70–140)
POTASSIUM: 5.2 meq/L — AB (ref 3.5–5.1)
SODIUM: 138 meq/L (ref 136–145)
Total Bilirubin: 0.98 mg/dL (ref 0.20–1.20)
Total Protein: 6.8 g/dL (ref 6.4–8.3)

## 2017-03-01 LAB — CBC WITH DIFFERENTIAL/PLATELET
BASO%: 0.2 % (ref 0.0–2.0)
BASOS ABS: 0 10*3/uL (ref 0.0–0.1)
EOS%: 2.1 % (ref 0.0–7.0)
Eosinophils Absolute: 0.1 10*3/uL (ref 0.0–0.5)
HCT: 29.2 % — ABNORMAL LOW (ref 34.8–46.6)
HGB: 9.5 g/dL — ABNORMAL LOW (ref 11.6–15.9)
LYMPH%: 21.6 % (ref 14.0–49.7)
MCH: 32.1 pg (ref 25.1–34.0)
MCHC: 32.5 g/dL (ref 31.5–36.0)
MCV: 98.6 fL (ref 79.5–101.0)
MONO#: 0.4 10*3/uL (ref 0.1–0.9)
MONO%: 9.2 % (ref 0.0–14.0)
NEUT#: 2.9 10*3/uL (ref 1.5–6.5)
NEUT%: 66.9 % (ref 38.4–76.8)
Platelets: 80 10*3/uL — ABNORMAL LOW (ref 145–400)
RBC: 2.96 10*6/uL — AB (ref 3.70–5.45)
RDW: 14.7 % — ABNORMAL HIGH (ref 11.2–14.5)
WBC: 4.4 10*3/uL (ref 3.9–10.3)
lymph#: 0.9 10*3/uL (ref 0.9–3.3)

## 2017-03-01 MED ORDER — ZOLEDRONIC ACID 4 MG/100ML IV SOLN
4.0000 mg | Freq: Once | INTRAVENOUS | Status: AC
  Administered 2017-03-01: 4 mg via INTRAVENOUS
  Filled 2017-03-01: qty 100

## 2017-03-01 NOTE — Patient Instructions (Signed)

## 2017-03-01 NOTE — Progress Notes (Signed)
Okay to proceed with Zometa with Creatinine 1.3 per Dr. Jana Hakim.

## 2017-03-01 NOTE — Progress Notes (Signed)
ID: Daisy Larson   DOB: 19-Jul-1951  MR#: [id number]  CSN#:[id number]  PCP:  [name], MD GYN:   SUR:  Madilyn Hook, OD RADONC:  Eppie Gibson, MD OTHER:  Bevelyn Buckles, MD;  Kara Mead, MD  CHIEF COMPLAINT:  Hs of Right Breast Cancer  CURRENT TREATMENT: Completing 5 years of anastrozole  BREAST CANCER HISTORY: From the original intake note:  The patient had annual mammography 12/29/2011 at [facility], showing  an 11 mm focal nodular density in the upper outer quadrant of the right breast which had increased in size as compared to the prior study a year prior. Ultrasound showed no sonographic abnormality. 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 The patient underwent bilateral breast MRI 01/09/2012 showing a solitary enhancing mass in the lateral aspect of the right breast measuring 1.3 cm. There was no enlarged axillary or internal mammary adenopathy and no other abnormality was noted.  The patient was seen at the multidisciplinary breast cancer clinic 01/10/2012 with further treatments as detailed below  INTERVAL HISTORY:  Daisy Larson returns today for follow-up of her estrogen receptor positive breast cancer. The interval history is complex. She has had extensive foot surgery, has been essentially bedbound (or at any rate nonweightbearing) for more than 6 months, has lost weight, had an episode of renal failure requiring temporary hemodialysis, had encephalopathy secondary to her cirrhosis, and other issues. She has made a nice recovery and is now getting closer to her baseline. She is very motivated to exercise and try to be able to not need her motorized chair.  She tolerates anastrozole well.  Hot flashes and vaginal dryness are not a major issue. She never developed  the arthralgias or myalgias that many patients can experience on this medication. She obtains it at a good price.   REVIEW OF SYSTEMS:  A detailed review of systems today was otherwise stable  PAST MEDICAL HISTORY: Past Medical History:  Diagnosis Date  . Allergy    tape  . Angina    COSTOCONDRITIS    . Anxiety   . Bell's palsy 2014   twice  . Breast cancer ([place]) 01/19/12    right breast lumpectomy/ER/PR positibe,her-2 neg.  . Breast cancer ([place]) 2011  . Bundle branch block   . Cirrhosis ([place])   . Colon polyps 12/02/2012   TUBULAR ADENOMAS (X2) and Hyperplastic (X1)  . Complication of anesthesia    WITH SECON HERNIA REPAIR 2012 [facility]  DIFFICULTY O2 SAT DROPPING ADMIT TO HOSPITAL   . Depression   . Diabetes mellitus   . Diverticulosis   . External hemorrhoids   . Gastritis   . GERD (gastroesophageal reflux disease)    ULCERS  . Goiter   . Headache(784.0)    MIGRAINES  . Heart murmur   . Hepatitis     Did Not have hepatitis but needed Hep A and Hep B injections2012 SPOTS ON LIVER  DR Alonza Bogus  [phone]  . Hernia   . History of stomach ulcers   . Hypertension   . Hyperthyroidism    NODULES ON THYROID  (DR BALEN 37  12-609)  . Iron deficiency anemia 10/16/2012  . Multiple thyroid nodules   . Neuromuscular disorder ([place])    PERIPH NEUROPATHY   . Neuropathy of foot   . Restless legs   . S/P  radiation therapy 02/19/12 - 04/01/12   Right Breast: 5000 cgy/25 Fractions with Boost/ 1000 cGy/5 Fractions  . Short of breath on exertion   . Shortness of breath    WITH EXERTION   . Sleep apnea   She underwent liver biopsy 03/28/2011, at [facility], showing evidence of early cirrhosis. There was mildly active chronic hepatitis (grade 1). She also has a history of thyroid nodules which have been stable on serial ultrasounds according to the patient  PAST SURGICAL HISTORY: Past Surgical History:  Procedure Laterality Date  . ABDOMINAL HYSTERECTOMY    . ANAL  FISSURE REPAIR    . APPENDECTOMY    . BACK SURGERY    . BREAST SURGERY     BIOPSY  Right Breast -  [facility] Lymph Nodes  . CARPAL TUNNEL RELEASE     LEFT   . CESAREAN SECTION    . COLONOSCOPY W/ POLYPECTOMY  12/02/2012  . ESOPHAGOGASTRODUODENOSCOPY  12/02/2012   Gastritis  . HAND SURGERY Bilateral   . HERNIA REPAIR     X 2  . LIVER BIOPSY  03/28/11   [facility] - Cirrhosis  . SPINAL FUSION    . TONSILLECTOMY    . TUBAL LIGATION      FAMILY HISTORY Family History  Problem Relation Age of Onset  . Lung cancer Maternal Grandfather   . Colon cancer Paternal Grandfather   . Diabetes Mother   . Hypertension Mother   . Heart disease Maternal Uncle   . Diabetes Sister   . Heart disease Sister   . Thyroid disease Sister   . Heart disease Father   . Hypertension Father   . Diabetes Daughter   . Hypertension Other   . Heart disease Other    the patient's father died at the age of 65, following a fall. The patient's mother died at the age of 27. 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 The patient had no brothers, 3 sisters, one of whom is present at the initial visit. There is no history of breast or ovarian cancer in the family, but one niece was diagnosed with colon cancer at the age of 81. Her paternal grandfather died from colon cancer. Her maternal grandfather died from lung cancer.  GYNECOLOGIC HISTORY: She had menarche at age 89. She went through menopause approximately age 68. She took hormone replacement on total recently. She is GX P2, first pregnancy to term age 37.  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 Her husband of 40+ years, Daisy Larson, is a Theme park manager. He underwent heart transplant at [facility] at May 2011 and is doing very well except for pain from costochondritis. Their children are Daisy Larson who lives in [place] and is a Geophysicist/field seismologist, and Daisy Larson,  also living in [place], who works as a Physiological scientist. The patient has one grandchild. She attends an independent [employer]: in place  HEALTH MAINTENANCE: (Updated January 2015) Social History  Substance Use Topics  . Smoking status: Never Smoker  . Smokeless tobacco: Never Used  . Alcohol use No     Colonoscopy: 2014, Dr. Sharlett Iles  PAP: November 2014  Bone density: June 2013 [facility], osteopenia with a T    score of -1.9  Lipid panel:  Not on file, Dr. Eulas Post  Allergies  Allergen Reactions  . Other Rash  Blisters Per Patient - she has been told she can not have general anesthesia "unless life-threatening"  . Tape   . Oxymorphone     lethargic  . Barbiturates Dermatitis and Other (See Comments)    Drowsiness, patient states that Barbiturates & Narcotics make her extremely sleepy and drowsy 24 Apr 2013 Drowsiness  "sleeps forever"    Current Outpatient Prescriptions  Medication Sig Dispense Refill  . AMBULATORY NON FORMULARY MEDICATION Medication Name: Nitroglycerin ointment 0.125% Apply pea-size amount internally 4 times daily 60 g 0  . anastrozole (ARIMIDEX) 1 MG tablet Take 1 tablet (1 mg total) by mouth daily. 90 tablet 4  . aspirin 81 MG tablet Take 81 mg by mouth daily.    Marland Kitchen b complex vitamins capsule Take 1 capsule by mouth daily.     . BD INSULIN SYRINGE ULTRAFINE 31G X 5/16" 0.3 ML MISC USE  TO INJECT  INSULIN THREE TIMES DAILY 300 each 1  . buPROPion (WELLBUTRIN XL) 300 MG 24 hr tablet Take 1 tablet by mouth Daily.    . calcium carbonate (OS-CAL) 600 MG TABS tablet Take 1,200 mg by mouth.    . Cinnamon 500 MG capsule Take 500 mg by mouth daily.     . citalopram (CELEXA) 20 MG tablet Take 1 tablet by mouth daily.    . DULoxetine (CYMBALTA) 60 MG capsule Take 60 mg by mouth daily.    . fluticasone (FLONASE) 50 MCG/ACT nasal spray Frequency:PRN   Dosage:50   MCG  Instructions:  Note:Dose: 50 MCG    .  furosemide (LASIX) 40 MG tablet Take 20 mg by mouth 2 (two) times daily.     Marland Kitchen gabapentin (NEURONTIN) 800 MG tablet 800 mg 3 (three) times daily.     Marland Kitchen glucagon (GLUCAGON EMERGENCY) 1 MG injection Inject 1 mg into the vein once as needed. 1 each 1  . glucose blood test strip Use as instructed to check blood sugar 4 times per day Dx: E11.65 400 each 1  . Grape Seed Extract 50 MG CAPS Take by mouth daily.     . insulin regular human CONCENTRATED (HUMULIN R) 500 UNIT/ML SOLN injection Use three times daily with meals per sliding scale. Max dose 80 units per day. This is equivalent to 400 units of U-100 insulin per day. 60 mL 1  . lactulose (CEPHULAC) 20 G packet Take 20 g by mouth 3 (three) times daily.    Marland Kitchen lisinopril (PRINIVIL,ZESTRIL) 20 MG tablet Take 20 mg by mouth every morning. Take in the morning    . loratadine (CLARITIN) 10 MG tablet Take 10 mg by mouth daily.    . Magnesium Oxide 400 MG CAPS Take 400 mg by mouth daily.     . meclizine (ANTIVERT) 12.5 MG tablet Take 12.5 mg by mouth 3 (three) times daily as needed.    . Milk Thistle 500 MG CAPS Take 1,000 mg by mouth daily.     . MULTIPLE VITAMIN PO Take 1 tablet by mouth daily.    . Omega-3 Fatty Acids (EQL OMEGA 3 FISH OIL) 1400 MG CAPS Take 1 capsule by mouth daily.    Glory Rosebush DELICA LANCETS 51O MISC Use to test sugars 4 x daily. 100 each 11  . pantoprazole (PROTONIX) 40 MG tablet Take 40 mg by mouth at bedtime.     . polyethylene glycol (MIRALAX / GLYCOLAX) packet Take 17 g by mouth daily.    . pramipexole (MIRAPEX) 1 MG tablet Take 2 mg by mouth  at bedtime.     . propranolol (INDERAL LA) 60 MG 24 hr capsule Take 60 mg by mouth daily. Take at night    . spironolactone (ALDACTONE) 50 MG tablet Take 50 mg by mouth 2 (two) times daily.     . traMADol (ULTRAM) 50 MG tablet Take 1 tablet (50 mg total) by mouth every 6 (six) hours as needed for moderate pain or severe pain. 15 tablet 0  . XIFAXAN 550 MG TABS tablet Take 1 tablet by mouth  2 (two) times daily.     No current facility-administered medications for this visit.     OBJECTIVE: Middle-aged white woman Who appears older than stated age  45:   03/01/17 1040  BP: (!) 122/37  Pulse: 69  Resp: 18  Temp: 97.8 F (36.6 C)     Body mass index is 46.92 kg/m.    ECOG FS: 2 Filed Weights   03/01/17 1040  Weight: 271 lb 3.2 oz (123 kg)    Sclerae unicteric, pupils round and equal Oropharynx clear, slightly dry No cervical or supraclavicular adenopathy Lungs no rales or rhonchi Heart regular rate and rhythm Abd soft, obese, nontender, positive bowel sounds MSK no focal spinal tenderness, no upper extremity lymphedema Neuro: nonfocal, well oriented, positive affect Breasts: The right breast is undergone lumpectomy and radiation with no evidence of local recurrence. The left breast is unremarkable. Both axillae are benign.  LAB RESULTS: Lab Results  Component Value Date   WBC 4.4 03/01/2017   NEUTROABS 2.9 03/01/2017   HGB 9.5 (L) 03/01/2017   HCT 29.2 (L) 03/01/2017   MCV 98.6 03/01/2017   PLT 80 (L) 03/01/2017      Chemistry      Component Value Date/Time   NA 137 01/20/2016 1335   K 4.5 01/20/2016 1335   CL 102 09/14/2015 0808   CL 81 (L) 02/25/2013 1300   CO2 25 01/20/2016 1335   BUN 11.6 01/20/2016 1335   CREATININE 0.9 01/20/2016 1335   GLU 360 09/22/2014      Component Value Date/Time   CALCIUM 8.6 01/20/2016 1335   ALKPHOS 261 (H) 01/20/2016 1335   AST 81 (H) 01/20/2016 1335   ALT 46 01/20/2016 1335   BILITOT 1.21 (H) 01/20/2016 1335        STUDIES: DEXA scan at [facility] 06/13/2016 showed a T score of -2.0. This is essentially stable.   ASSESSMENT: 66 y.[place]   (1) status post right lumpectomy 01/19/2012 for a T1c N0, stage IA invasive mucinous carcinoma, grade 1, estrogen 99% and progesterone 100% receptor positive, with an MIB of 8, and no HER-2 amplification.  (2) completed radiation 04/01/2012  (3) on  anastrozole started 2013, completed 5 years May 2018  (4)  iron deficiency anemia, s/p ferumoxytol x2 December 2013  (5) osteopenia, L fem neck T -1.9 on June 2013.  Receives zoledronic acid on an annual basis, first dose in February 2014   (a) repeat bone density at [facility] 06/13/2016 showed a T score of -2.0.  (6)  Multiple comorbitities including uncontrolled diabetes, cirrhosis, and hypertension.  PLAN:  Daisy Larson is now 5 years out from definitive surgery for her breast cancer with no evidence of disease recurrence. This is very favorable.  She has completed 5 years of anastrozole. In some patients we are continuing anastrozole for a couple of extra years, but that is a very minimal further risk reduction and in node-negative patients I do not recommend it.  That  is particularly true in patients who do have problems with osteopenia as Daisy Larson does. She continues on zolendronate yearly and will receive her last dose today. That has essentially kept her bone density stable.  At this point I feel comfortable releasing her to her primary care physician. All she needs in terms of breast cancer follow-up is her yearly mammogram, which she usually obtains at [facility] in August, and a yearly physician breast exam.  I will be glad to see Daisy Larson at any point in the future if on when the need arises but as of now more making no further routine appointment for her here.   Chauncey Cruel, MD   03/01/2017

## 2017-03-16 ENCOUNTER — Emergency Department (HOSPITAL_COMMUNITY): Payer: Medicare Other

## 2017-03-16 ENCOUNTER — Encounter (HOSPITAL_COMMUNITY): Payer: Self-pay | Admitting: Emergency Medicine

## 2017-03-16 ENCOUNTER — Inpatient Hospital Stay (HOSPITAL_COMMUNITY)
Admission: EM | Admit: 2017-03-16 | Discharge: 2017-03-21 | DRG: 480 | Disposition: A | Discharge status: Skilled Nursing Facility | Payer: Medicare Other | Attending: Internal Medicine | Admitting: Internal Medicine

## 2017-03-16 ENCOUNTER — Inpatient Hospital Stay (HOSPITAL_COMMUNITY): Payer: Medicare Other

## 2017-03-16 DIAGNOSIS — G4733 Obstructive sleep apnea (adult) (pediatric): Secondary | ICD-10-CM | POA: Diagnosis present

## 2017-03-16 DIAGNOSIS — E0590 Thyrotoxicosis, unspecified without thyrotoxic crisis or storm: Secondary | ICD-10-CM | POA: Diagnosis present

## 2017-03-16 DIAGNOSIS — Z7982 Long term (current) use of aspirin: Secondary | ICD-10-CM

## 2017-03-16 DIAGNOSIS — E1140 Type 2 diabetes mellitus with diabetic neuropathy, unspecified: Secondary | ICD-10-CM | POA: Diagnosis not present

## 2017-03-16 DIAGNOSIS — E662 Morbid (severe) obesity with alveolar hypoventilation: Secondary | ICD-10-CM | POA: Diagnosis present

## 2017-03-16 DIAGNOSIS — IMO0002 Reserved for concepts with insufficient information to code with codable children: Secondary | ICD-10-CM | POA: Diagnosis present

## 2017-03-16 DIAGNOSIS — Z170 Estrogen receptor positive status [ER+]: Secondary | ICD-10-CM | POA: Diagnosis not present

## 2017-03-16 DIAGNOSIS — E875 Hyperkalemia: Secondary | ICD-10-CM | POA: Diagnosis present

## 2017-03-16 DIAGNOSIS — Z923 Personal history of irradiation: Secondary | ICD-10-CM

## 2017-03-16 DIAGNOSIS — E1165 Type 2 diabetes mellitus with hyperglycemia: Secondary | ICD-10-CM | POA: Diagnosis not present

## 2017-03-16 DIAGNOSIS — K766 Portal hypertension: Secondary | ICD-10-CM | POA: Diagnosis present

## 2017-03-16 DIAGNOSIS — E1122 Type 2 diabetes mellitus with diabetic chronic kidney disease: Secondary | ICD-10-CM | POA: Diagnosis not present

## 2017-03-16 DIAGNOSIS — K219 Gastro-esophageal reflux disease without esophagitis: Secondary | ICD-10-CM | POA: Diagnosis present

## 2017-03-16 DIAGNOSIS — Z419 Encounter for procedure for purposes other than remedying health state, unspecified: Secondary | ICD-10-CM

## 2017-03-16 DIAGNOSIS — G8911 Acute pain due to trauma: Secondary | ICD-10-CM

## 2017-03-16 DIAGNOSIS — Z09 Encounter for follow-up examination after completed treatment for conditions other than malignant neoplasm: Secondary | ICD-10-CM

## 2017-03-16 DIAGNOSIS — J9602 Acute respiratory failure with hypercapnia: Secondary | ICD-10-CM | POA: Diagnosis not present

## 2017-03-16 DIAGNOSIS — R9431 Abnormal electrocardiogram [ECG] [EKG]: Secondary | ICD-10-CM | POA: Diagnosis not present

## 2017-03-16 DIAGNOSIS — G2581 Restless legs syndrome: Secondary | ICD-10-CM | POA: Diagnosis present

## 2017-03-16 DIAGNOSIS — D649 Anemia, unspecified: Secondary | ICD-10-CM | POA: Diagnosis present

## 2017-03-16 DIAGNOSIS — S72142A Displaced intertrochanteric fracture of left femur, initial encounter for closed fracture: Principal | ICD-10-CM | POA: Diagnosis present

## 2017-03-16 DIAGNOSIS — E1142 Type 2 diabetes mellitus with diabetic polyneuropathy: Secondary | ICD-10-CM | POA: Diagnosis present

## 2017-03-16 DIAGNOSIS — F329 Major depressive disorder, single episode, unspecified: Secondary | ICD-10-CM | POA: Diagnosis present

## 2017-03-16 DIAGNOSIS — D696 Thrombocytopenia, unspecified: Secondary | ICD-10-CM | POA: Diagnosis present

## 2017-03-16 DIAGNOSIS — K7469 Other cirrhosis of liver: Secondary | ICD-10-CM | POA: Diagnosis present

## 2017-03-16 DIAGNOSIS — Z794 Long term (current) use of insulin: Secondary | ICD-10-CM

## 2017-03-16 DIAGNOSIS — Z981 Arthrodesis status: Secondary | ICD-10-CM

## 2017-03-16 DIAGNOSIS — I10 Essential (primary) hypertension: Secondary | ICD-10-CM | POA: Diagnosis present

## 2017-03-16 DIAGNOSIS — Z993 Dependence on wheelchair: Secondary | ICD-10-CM

## 2017-03-16 DIAGNOSIS — M25512 Pain in left shoulder: Secondary | ICD-10-CM

## 2017-03-16 DIAGNOSIS — J9601 Acute respiratory failure with hypoxia: Secondary | ICD-10-CM | POA: Diagnosis not present

## 2017-03-16 DIAGNOSIS — S72092A Other fracture of head and neck of left femur, initial encounter for closed fracture: Secondary | ICD-10-CM

## 2017-03-16 DIAGNOSIS — C50411 Malignant neoplasm of upper-outer quadrant of right female breast: Secondary | ICD-10-CM

## 2017-03-16 DIAGNOSIS — S72009A Fracture of unspecified part of neck of unspecified femur, initial encounter for closed fracture: Secondary | ICD-10-CM | POA: Diagnosis present

## 2017-03-16 DIAGNOSIS — W010XXA Fall on same level from slipping, tripping and stumbling without subsequent striking against object, initial encounter: Secondary | ICD-10-CM | POA: Diagnosis present

## 2017-03-16 DIAGNOSIS — Z01811 Encounter for preprocedural respiratory examination: Secondary | ICD-10-CM

## 2017-03-16 DIAGNOSIS — Z6837 Body mass index (BMI) 37.0-37.9, adult: Secondary | ICD-10-CM | POA: Diagnosis not present

## 2017-03-16 DIAGNOSIS — N183 Chronic kidney disease, stage 3 (moderate): Secondary | ICD-10-CM | POA: Diagnosis not present

## 2017-03-16 DIAGNOSIS — F419 Anxiety disorder, unspecified: Secondary | ICD-10-CM | POA: Diagnosis present

## 2017-03-16 DIAGNOSIS — M8580 Other specified disorders of bone density and structure, unspecified site: Secondary | ICD-10-CM | POA: Diagnosis present

## 2017-03-16 DIAGNOSIS — D62 Acute posthemorrhagic anemia: Secondary | ICD-10-CM | POA: Diagnosis not present

## 2017-03-16 DIAGNOSIS — M25552 Pain in left hip: Secondary | ICD-10-CM | POA: Diagnosis present

## 2017-03-16 DIAGNOSIS — M14672 Charcot's joint, left ankle and foot: Secondary | ICD-10-CM | POA: Diagnosis present

## 2017-03-16 DIAGNOSIS — J9622 Acute and chronic respiratory failure with hypercapnia: Secondary | ICD-10-CM | POA: Diagnosis not present

## 2017-03-16 DIAGNOSIS — K7290 Hepatic failure, unspecified without coma: Secondary | ICD-10-CM | POA: Diagnosis present

## 2017-03-16 DIAGNOSIS — W19XXXA Unspecified fall, initial encounter: Secondary | ICD-10-CM

## 2017-03-16 DIAGNOSIS — N179 Acute kidney failure, unspecified: Secondary | ICD-10-CM

## 2017-03-16 DIAGNOSIS — I4581 Long QT syndrome: Secondary | ICD-10-CM | POA: Diagnosis present

## 2017-03-16 DIAGNOSIS — E872 Acidosis: Secondary | ICD-10-CM | POA: Diagnosis present

## 2017-03-16 DIAGNOSIS — E119 Type 2 diabetes mellitus without complications: Secondary | ICD-10-CM

## 2017-03-16 DIAGNOSIS — D631 Anemia in chronic kidney disease: Secondary | ICD-10-CM | POA: Diagnosis not present

## 2017-03-16 DIAGNOSIS — Z79899 Other long term (current) drug therapy: Secondary | ICD-10-CM

## 2017-03-16 LAB — CBC WITH DIFFERENTIAL/PLATELET
BASOS PCT: 0 %
Basophils Absolute: 0 10*3/uL (ref 0.0–0.1)
EOS ABS: 0.1 10*3/uL (ref 0.0–0.7)
Eosinophils Relative: 3 %
HCT: 26.8 % — ABNORMAL LOW (ref 36.0–46.0)
HEMOGLOBIN: 8.7 g/dL — AB (ref 12.0–15.0)
Lymphocytes Relative: 18 %
Lymphs Abs: 0.9 10*3/uL (ref 0.7–4.0)
MCH: 31.8 pg (ref 26.0–34.0)
MCHC: 32.5 g/dL (ref 30.0–36.0)
MCV: 97.8 fL (ref 78.0–100.0)
Monocytes Absolute: 0.4 10*3/uL (ref 0.1–1.0)
Monocytes Relative: 8 %
Neutro Abs: 3.5 10*3/uL (ref 1.7–7.7)
Neutrophils Relative %: 71 %
Platelets: 112 10*3/uL — ABNORMAL LOW (ref 150–400)
RBC: 2.74 MIL/uL — ABNORMAL LOW (ref 3.87–5.11)
RDW: 15 % (ref 11.5–15.5)
WBC: 5 10*3/uL (ref 4.0–10.5)

## 2017-03-16 LAB — COMPREHENSIVE METABOLIC PANEL
ALK PHOS: 162 U/L — AB (ref 38–126)
ALT: 28 U/L (ref 14–54)
AST: 52 U/L — ABNORMAL HIGH (ref 15–41)
Albumin: 2.6 g/dL — ABNORMAL LOW (ref 3.5–5.0)
Anion gap: 8 (ref 5–15)
BILIRUBIN TOTAL: 0.6 mg/dL (ref 0.3–1.2)
BUN: 19 mg/dL (ref 6–20)
CALCIUM: 8.9 mg/dL (ref 8.9–10.3)
CO2: 26 mmol/L (ref 22–32)
CREATININE: 1.63 mg/dL — AB (ref 0.44–1.00)
Chloride: 106 mmol/L (ref 101–111)
GFR, EST AFRICAN AMERICAN: 37 mL/min — AB (ref >60)
GFR, EST NON AFRICAN AMERICAN: 32 mL/min — AB (ref >60)
Glucose, Bld: 235 mg/dL — ABNORMAL HIGH (ref 65–99)
Potassium: 5.2 mmol/L — ABNORMAL HIGH (ref 3.5–5.1)
Sodium: 140 mmol/L (ref 135–145)
TOTAL PROTEIN: 6.6 g/dL (ref 6.5–8.1)

## 2017-03-16 LAB — PROTIME-INR
INR: 1.11
Prothrombin Time: 14.4 seconds (ref 11.4–15.2)

## 2017-03-16 LAB — PREPARE RBC (CROSSMATCH)

## 2017-03-16 MED ORDER — LACTULOSE 10 GM/15ML PO SOLN: 20.0000 g | Freq: Three times a day (TID) | ORAL | Status: DC

## 2017-03-16 MED ORDER — INSULIN ASPART 100 UNIT/ML ~~LOC~~ SOLN
0.0000 [IU] | SUBCUTANEOUS | Status: DC
  Administered 2017-03-17: 2 [IU] via SUBCUTANEOUS
  Administered 2017-03-17 (×3): 1 [IU] via SUBCUTANEOUS
  Administered 2017-03-18: 2 [IU] via SUBCUTANEOUS
  Administered 2017-03-18: 5 [IU] via SUBCUTANEOUS
  Administered 2017-03-18: 7 [IU] via SUBCUTANEOUS
  Administered 2017-03-18: 1 [IU] via SUBCUTANEOUS
  Administered 2017-03-19 (×3): 7 [IU] via SUBCUTANEOUS
  Administered 2017-03-19: 9 [IU] via SUBCUTANEOUS
  Administered 2017-03-19: 5 [IU] via SUBCUTANEOUS
  Administered 2017-03-19: 7 [IU] via SUBCUTANEOUS
  Administered 2017-03-20: 3 [IU] via SUBCUTANEOUS
  Administered 2017-03-20 (×2): 5 [IU] via SUBCUTANEOUS
  Administered 2017-03-20 (×2): 3 [IU] via SUBCUTANEOUS
  Administered 2017-03-20: 5 [IU] via SUBCUTANEOUS
  Administered 2017-03-21 (×2): 3 [IU] via SUBCUTANEOUS
  Administered 2017-03-21: 5 [IU] via SUBCUTANEOUS
  Administered 2017-03-21: 2 [IU] via SUBCUTANEOUS

## 2017-03-16 MED ORDER — SODIUM CHLORIDE 0.9 % IV BOLUS (SEPSIS)
1000.0000 mL | Freq: Once | INTRAVENOUS | Status: AC
  Administered 2017-03-16: 1000 mL via INTRAVENOUS

## 2017-03-16 MED ORDER — GABAPENTIN 400 MG PO CAPS
800.0000 mg | ORAL_CAPSULE | Freq: Three times a day (TID) | ORAL | Status: DC
  Administered 2017-03-17 – 2017-03-21 (×10): 800 mg via ORAL
  Filled 2017-03-16 (×11): qty 2

## 2017-03-16 MED ORDER — FENTANYL CITRATE (PF) 100 MCG/2ML IJ SOLN
25.0000 ug | Freq: Once | INTRAMUSCULAR | Status: AC
  Administered 2017-03-16: 25 ug via INTRAVENOUS
  Filled 2017-03-16: qty 2

## 2017-03-16 MED ORDER — POLYETHYLENE GLYCOL 3350 17 G PO PACK
17.0000 g | PACK | Freq: Three times a day (TID) | ORAL | Status: DC
  Administered 2017-03-18 – 2017-03-21 (×9): 17 g via ORAL
  Filled 2017-03-16 (×9): qty 1

## 2017-03-16 MED ORDER — DULOXETINE HCL 60 MG PO CPEP
60.0000 mg | ORAL_CAPSULE | Freq: Every day | ORAL | Status: DC
  Administered 2017-03-18 – 2017-03-21 (×4): 60 mg via ORAL
  Filled 2017-03-16 (×2): qty 2
  Filled 2017-03-16: qty 1
  Filled 2017-03-16: qty 2
  Filled 2017-03-16: qty 1

## 2017-03-16 MED ORDER — INSULIN GLARGINE 100 UNIT/ML ~~LOC~~ SOLN
65.0000 [IU] | Freq: Every day | SUBCUTANEOUS | Status: DC
  Administered 2017-03-17 – 2017-03-20 (×4): 65 [IU] via SUBCUTANEOUS
  Filled 2017-03-16 (×5): qty 0.65

## 2017-03-16 MED ORDER — ACETAMINOPHEN 325 MG PO TABS: 650.0000 mg | ORAL_TABLET | Freq: Once | ORAL | Status: DC

## 2017-03-16 MED ORDER — BISACODYL 10 MG RE SUPP: 10.0000 mg | Freq: Every day | RECTAL | Status: DC | PRN

## 2017-03-16 MED ORDER — PROPRANOLOL HCL ER 60 MG PO CP24
60.0000 mg | ORAL_CAPSULE | Freq: Every day | ORAL | Status: DC
  Administered 2017-03-17 – 2017-03-21 (×4): 60 mg via ORAL
  Filled 2017-03-16 (×5): qty 1

## 2017-03-16 MED ORDER — SULFAMETHOXAZOLE-TRIMETHOPRIM 800-160 MG PO TABS
1.0000 | ORAL_TABLET | Freq: Two times a day (BID) | ORAL | Status: DC
  Administered 2017-03-17: 1 via ORAL
  Filled 2017-03-16 (×2): qty 1

## 2017-03-16 MED ORDER — PRAMIPEXOLE DIHYDROCHLORIDE 1 MG PO TABS
2.0000 mg | ORAL_TABLET | Freq: Every day | ORAL | Status: DC
  Administered 2017-03-17 – 2017-03-20 (×3): 2 mg via ORAL
  Filled 2017-03-16 (×5): qty 2

## 2017-03-16 MED ORDER — MORPHINE SULFATE (PF) 4 MG/ML IV SOLN
1.0000 mg | INTRAVENOUS | Status: DC | PRN
  Administered 2017-03-17: 2 mg via INTRAVENOUS
  Filled 2017-03-16: qty 1

## 2017-03-16 MED ORDER — FENTANYL CITRATE (PF) 100 MCG/2ML IJ SOLN: 50.0000 ug | Freq: Once | INTRAMUSCULAR | Status: DC

## 2017-03-16 MED ORDER — BUPROPION HCL ER (XL) 300 MG PO TB24
300.0000 mg | ORAL_TABLET | Freq: Every day | ORAL | Status: DC
  Administered 2017-03-18 – 2017-03-21 (×4): 300 mg via ORAL
  Filled 2017-03-16 (×5): qty 1

## 2017-03-16 MED ORDER — MORPHINE SULFATE (PF) 4 MG/ML IV SOLN
4.0000 mg | Freq: Once | INTRAVENOUS | Status: AC
  Administered 2017-03-16: 4 mg via INTRAVENOUS
  Filled 2017-03-16: qty 1

## 2017-03-16 MED ORDER — HYDROCODONE-ACETAMINOPHEN 5-325 MG PO TABS
1.0000 | ORAL_TABLET | Freq: Four times a day (QID) | ORAL | Status: DC | PRN
  Administered 2017-03-18 – 2017-03-20 (×4): 1 via ORAL
  Filled 2017-03-16 (×4): qty 1
  Filled 2017-03-16: qty 2

## 2017-03-16 MED ORDER — RIFAXIMIN 550 MG PO TABS
550.0000 mg | ORAL_TABLET | Freq: Two times a day (BID) | ORAL | Status: DC
  Administered 2017-03-17 – 2017-03-21 (×9): 550 mg via ORAL
  Filled 2017-03-16 (×10): qty 1

## 2017-03-16 MED ORDER — SODIUM CHLORIDE 0.9 % IV SOLN
Freq: Once | INTRAVENOUS | Status: AC
  Administered 2017-03-17: 01:00:00 via INTRAVENOUS

## 2017-03-16 MED ORDER — LORATADINE 10 MG PO TABS
10.0000 mg | ORAL_TABLET | Freq: Every day | ORAL | Status: DC
  Administered 2017-03-18 – 2017-03-21 (×4): 10 mg via ORAL
  Filled 2017-03-16 (×5): qty 1

## 2017-03-16 MED ORDER — METHOCARBAMOL 500 MG PO TABS
500.0000 mg | ORAL_TABLET | Freq: Four times a day (QID) | ORAL | Status: DC | PRN
  Administered 2017-03-16: 500 mg via ORAL
  Filled 2017-03-16: qty 1

## 2017-03-16 MED ORDER — MORPHINE SULFATE (PF) 4 MG/ML IV SOLN
4.0000 mg | Freq: Once | INTRAVENOUS | Status: AC
Start: 2017-03-16 — End: 2017-03-16
  Administered 2017-03-16: 4 mg via INTRAVENOUS
  Filled 2017-03-16: qty 1

## 2017-03-16 NOTE — ED Notes (Signed)
Pt still unable to urinate 

## 2017-03-16 NOTE — ED Provider Notes (Signed)
[facility] DEPT Provider Note   CSN: [id number] Arrival date & time: 03/16/17  1704     History   Chief Complaint Chief Complaint  Patient presents with  . Fall  . Hip Pain    HPI Daisy Larson is a 66 y.o. female.  HPI   Daisy Larson is a 66 y.o. female, with a history of DM, HTN, obesity, multiple fractures, and neuropathy, presenting to the ED with left hip injury that occurred just prior to arrival. Also endorses lower back pain. Patient states she slipped on the bandage in place on her left foot and fell onto her buttocks and lower back. She states she did not hit her head. Endorses 10 out of 10, throbbing pain. Patient adds that she has abnormal sensitivity to pain medications and anesthesia. She also notes that she has severe neuropathy in the left lower leg below the knee and requests imaging studies of this area as well.  Denies nausea/vomiting, LOC, neck pain, acute neuro deficits, or any other complaints.      Past Medical History:  Diagnosis Date  . Allergy    tape  . Angina    COSTOCONDRITIS    . Anxiety   . Bell's palsy 2014   twice  . Breast cancer ([place]) 01/19/12    right breast lumpectomy/ER/PR positibe,her-2 neg.  . Breast cancer ([place]) 2011  . Bundle branch block   . Cirrhosis ([place])   . Colon polyps 12/02/2012   TUBULAR ADENOMAS (X2) and Hyperplastic (X1)  . Complication of anesthesia    WITH SECON HERNIA REPAIR 2012 [facility]  DIFFICULTY O2 SAT DROPPING ADMIT TO HOSPITAL   . Depression   . Diabetes mellitus   . Diverticulosis   . External hemorrhoids   . Gastritis   . GERD (gastroesophageal reflux disease)    ULCERS  . Goiter   . Headache(784.0)    MIGRAINES  . Heart murmur   . Hepatitis     Did Not have hepatitis but needed Hep A and Hep B injections2012 SPOTS ON LIVER  DR Alonza Bogus  [phone]  . Hernia   . History of stomach ulcers   . Hypertension   . Hyperthyroidism    NODULES ON THYROID  (DR BALEN 37  12-609)  .  Iron deficiency anemia 10/16/2012  . Multiple thyroid nodules   . Neuromuscular disorder ([place])    PERIPH NEUROPATHY   . Neuropathy of foot   . Restless legs   . S/P radiation therapy 02/19/12 - 04/01/12   Right Breast: 5000 cgy/25 Fractions with Boost/ 1000 cGy/5 Fractions  . Short of breath on exertion   . Shortness of breath    WITH EXERTION   . Sleep apnea     Patient Active Problem List   Diagnosis Date Noted  . Hyperkalemia 03/16/2017  . Hx of Bell's palsy 08/27/2014  . Essential hypertension 08/27/2014  . History of cardiac dysrhythmia 08/27/2014  . Charcot's joint of right foot 08/27/2014  . Chronic hypercapnic respiratory failure ([place]) 08/27/2014  . Long-term insulin use ([place]) 08/27/2014  . Depression, major, recurrent ([place]) 08/27/2014  . Nontoxic multinodular goiter 06/24/2014  . Osteopenia 11/13/2013  . Thrombocytopenia, unspecified ([place]) 05/12/2013  . Iron deficiency anemia 10/16/2012  . Encephalopathy acute 10/09/2012  . OSA (obstructive sleep apnea) 10/09/2012  . 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 Pica in adults 10/08/2012  . Anemia 10/08/2012  . Syncope 10/07/2012  . History of breast cancer in female 01/19/2012  . Malignant  neoplasm of upper-outer quadrant of right breast in female, estrogen receptor positive ([place]) 01/09/2012  . Type 2 diabetes, uncontrolled, with neuropathy ([place]) 12/27/2010  . RESTLESS LEGS SYNDROME 12/27/2010  . Diabetic polyneuropathy ([place]) 12/27/2010  . DM2 (diabetes mellitus, type 2) ([place]) 09/14/2010  . HYPERLIPIDEMIA, MIXED, WITH HIGH HDL 09/14/2010  . Essential hypertension, benign 09/14/2010  . PERSONAL HISTORY OF COLONIC POLYPS 09/14/2010    Past Surgical History:  Procedure Laterality Date  . ABDOMINAL HYSTERECTOMY    . ANAL FISSURE REPAIR    . APPENDECTOMY    . BACK SURGERY    . BREAST SURGERY     BIOPSY  Right Breast -  Sentinel Lymph Nodes  . CARPAL TUNNEL RELEASE     LEFT   . CESAREAN SECTION    . COLONOSCOPY  W/ POLYPECTOMY  12/02/2012  . ESOPHAGOGASTRODUODENOSCOPY  12/02/2012   Gastritis  . HAND SURGERY Bilateral   . HERNIA REPAIR     X 2  . LIVER BIOPSY  03/28/11   [facility] - Cirrhosis  . SPINAL FUSION    . TONSILLECTOMY    . TUBAL LIGATION      OB History    Gravida Para Term Preterm AB Living   2 2           SAB TAB Ectopic Multiple Live Births                  Obstetric Comments    Menarch age38G2, 7, parity age 67, Use of HRT short period of time       Home Medications    Prior to Admission medications   Medication Sig Start Date End Date Taking? Authorizing Provider  anastrozole (ARIMIDEX) 1 MG tablet Take 1 tablet (1 mg total) by mouth daily. 01/25/16  Yes Magrinat, Virgie Dad, MD  aspirin 81 MG tablet Take 81 mg by mouth daily.   Yes [provider]  b complex vitamins capsule Take 1 capsule by mouth daily.    Yes [provider]  buPROPion (WELLBUTRIN XL) 300 MG 24 hr tablet Take 1 tablet by mouth Daily. 07/08/12  Yes [provider]  calcium carbonate (OS-CAL) 600 MG TABS tablet Take 1,200 mg by mouth.   Yes [provider]  Cinnamon 500 MG capsule Take 500 mg by mouth daily.    Yes [provider]  citalopram (CELEXA) 20 MG tablet Take 1 tablet by mouth daily. 03/13/15  Yes [provider]  DULoxetine (CYMBALTA) 60 MG capsule Take 60 mg by mouth daily.   Yes [provider]  fluticasone (FLONASE) 50 MCG/ACT nasal spray Frequency:PRN   Dosage:50   MCG  Instructions:  Note:Dose: 50 MCG 05/04/11  Yes [provider]  furosemide (LASIX) 40 MG tablet Take 20 mg by mouth 2 (two) times daily.  02/21/13  Yes [provider]  gabapentin (NEURONTIN) 800 MG tablet 800 mg 3 (three) times daily.  12/21/11  Yes [provider]  glucagon (GLUCAGON EMERGENCY) 1 MG injection Inject 1 mg into the vein once as needed. 05/04/15  Yes Elayne Snare, MD  Grape Seed Extract 50 MG CAPS Take by mouth daily.     Yes [provider]  insulin aspart protamine- aspart (NOVOLOG MIX 70/30) (70-30) 100 UNIT/ML injection Inject 120 Units into the skin daily.   Yes [provider]  insulin regular human CONCENTRATED (HUMULIN R) 500 UNIT/ML SOLN injection Use three times daily with meals per sliding scale. Max dose 80 units  per day. This is equivalent to 400 units of U-100 insulin per day. 04/07/15  Yes Phadke, Karsten Ro, MD  lactulose (CEPHULAC) 20 G packet Take 20 g by mouth 3 (three) times daily.   Yes [provider]  lisinopril (PRINIVIL,ZESTRIL) 20 MG tablet Take 20 mg by mouth every morning. Take in the morning 11/24/11  Yes [provider]  loratadine (CLARITIN) 10 MG tablet Take 10 mg by mouth daily.   Yes [provider]  Magnesium Oxide 400 MG CAPS Take 400 mg by mouth daily.    Yes [provider]  meclizine (ANTIVERT) 12.5 MG tablet Take 12.5 mg by mouth 3 (three) times daily as needed.   Yes [provider]  Milk Thistle 500 MG CAPS Take 1,000 mg by mouth daily.  05/04/11  Yes [provider]  MULTIPLE VITAMIN PO Take 1 tablet by mouth daily.   Yes [provider]  Omega-3 Fatty Acids (EQL OMEGA 3 FISH OIL) 1400 MG CAPS Take 1 capsule by mouth daily.   Yes [provider]  pantoprazole (PROTONIX) 40 MG tablet Take 40 mg by mouth at bedtime.  05/05/14  Yes [provider]  polyethylene glycol (MIRALAX / GLYCOLAX) packet Take 17 g by mouth daily.   Yes [provider]  pramipexole (MIRAPEX) 1 MG tablet Take 2 mg by mouth at bedtime.    Yes [provider]  propranolol (INDERAL LA) 60 MG 24 hr capsule Take 60 mg by mouth daily. Take at night 11/23/11  Yes [provider]  spironolactone (ALDACTONE) 50 MG tablet Take 50 mg by mouth 2 (two) times daily.  04/30/13  Yes [provider]  sulfamethoxazole-trimethoprim (BACTRIM DS,SEPTRA DS) 800-160 MG tablet Take 1 tablet by mouth 2 (two)  times daily. 03/09/17  Yes [provider]  traMADol (ULTRAM) 50 MG tablet Take 1 tablet (50 mg total) by mouth every 6 (six) hours as needed for moderate pain or severe pain. 08/26/15  Yes Wofford, Jenny Reichmann, MD  XIFAXAN 550 MG TABS tablet Take 1 tablet by mouth 2 (two) times daily. 04/06/15  Yes [provider]  [place] Medication Name: Nitroglycerin ointment 0.125% Apply pea-size amount internally 4 times daily 03/04/15   Pyrtle, Lajuan Lines, MD  BD INSULIN SYRINGE ULTRAFINE 31G X 5/16" 0.3 ML MISC USE  TO INJECT  INSULIN THREE TIMES DAILY 01/10/17   Elayne Snare, MD  glucose blood test strip Use as instructed to check blood sugar 4 times per day Dx: E11.65 09/01/15   Elayne Snare, MD  [facility] DELICA LANCETS 90Z MISC Use to test sugars 4 x daily. 02/19/15   Haydee Monica, MD    Family History Family History  Problem Relation Age of Onset  . Diabetes Mother   . Hypertension Mother   . Heart disease Father   . Hypertension Father   . Hypertension Other   . Heart disease Other   . Lung cancer Maternal Grandfather   . Colon cancer Paternal Grandfather   . Heart disease Maternal Uncle   . Diabetes Sister   . Heart disease Sister   . Thyroid disease Sister   . Diabetes Daughter     Social History Social History  Substance Use Topics  . Smoking status: Never Smoker  . Smokeless tobacco: Never Used  . Alcohol use No     Allergies   Other; Tape; Oxymorphone; and Barbiturates   Review of Systems Review of Systems  Respiratory: Negative for shortness  of breath.   Cardiovascular: Negative for chest pain.  Gastrointestinal: Negative for nausea and vomiting.  Musculoskeletal: Positive for arthralgias and back pain. Negative for neck pain.  Neurological: Negative for dizziness, syncope, weakness, light-headedness, numbness and headaches.  All other systems reviewed and are negative.    Physical Exam Updated Vital Signs BP (!) 109/52 (BP  Location: Left Arm)   Pulse 60   Temp 98.3 F (36.8 C) (Oral)   Resp 20   Ht '5\' 8"'  (1.727 m)   Wt 245 lb (111.1 kg)   SpO2 100%   BMI 37.25 kg/m   Physical Exam  Constitutional: She appears well-developed and well-nourished.  HENT:  Head: Normocephalic and atraumatic.  Nose: Nose normal.  Mouth/Throat: Oropharynx is clear and moist.  No noted area of swelling, tenderness, wound, instability, or crepitus noted to the face or scalp.  Eyes: Conjunctivae and EOM are normal. Pupils are equal, round, and reactive to light.  Neck: Normal range of motion. Neck supple.  Cardiovascular: Normal rate, regular rhythm, normal heart sounds and intact distal pulses.   Pulmonary/Chest: Effort normal and breath sounds normal. No respiratory distress.  Abdominal: Soft. There is no tenderness. There is no guarding.  Musculoskeletal: She exhibits tenderness. She exhibits no edema.  Left ankle and foot is bandaged. Tenderness to the left anterior and lateral hip with some possible shortening and external rotation noted. Tenderness to the midline lumbar spine without noted deformity or step-off. Exam complicated by patient's obesity. No tenderness noted to the midline thoracic or cervical spines.  Lymphadenopathy:    She has no cervical adenopathy.  Neurological: She is alert.  Patient endorses no acute sensory deficits. Able to wiggle her left toes. Strength 5 out of 5 in right lower and bilateral upper extremities.  Skin: Skin is warm and dry. Capillary refill takes less than 2 seconds. She is not diaphoretic.  Psychiatric: She has a normal mood and affect. Her behavior is normal.  Nursing note and vitals reviewed.    ED Treatments / Results  Labs (all labs ordered are listed, but only abnormal results are displayed) Labs Reviewed  COMPREHENSIVE METABOLIC PANEL - Abnormal; Notable for the following:       Result Value   Potassium 5.2 (*)    Glucose, Bld 235 (*)    Creatinine, Ser 1.63 (*)      Albumin 2.6 (*)    AST 52 (*)    Alkaline Phosphatase 162 (*)    GFR calc non Af Amer 32 (*)    GFR calc Af Amer 37 (*)    All other components within normal limits  CBC WITH DIFFERENTIAL/PLATELET - Abnormal; Notable for the following:    RBC 2.74 (*)    Hemoglobin 8.7 (*)    HCT 26.8 (*)    Platelets 112 (*)    All other components within normal limits  URINALYSIS, ROUTINE W REFLEX MICROSCOPIC  PROTIME-INR  TYPE AND SCREEN   Hemoglobin  Date Value Ref Range Status  03/16/2017 8.7 (L) 12.0 - 15.0 g/dL Final  01/18/2015 12.3 12.0 - 15.0 g/dL Final   HGB  Date Value Ref Range Status  03/01/2017 9.5 (L) 11.6 - 15.9 g/dL Final  01/20/2016 11.5 (L) 11.6 - 15.9 g/dL Final  01/06/2015 14.0 11.6 - 15.9 g/dL Final  05/13/2014 12.8 11.6 - 15.9 g/dL Final   BUN  Date Value Ref Range Status  03/16/2017 19 6 - 20 mg/dL Final  03/01/2017 22.4 7.0 - 26.0 mg/dL Final  01/20/2016 11.6 7.0 - 26.0 mg/dL Final  09/14/2015 12 6 - 23 mg/dL Final  06/10/2015 19 6 - 23 mg/dL Final  01/06/2015 19.0 7.0 - 26.0 mg/dL Final  09/22/2014 18 4 - 21 mg/dL Final  06/24/2014 10 4 - 21 mg/dL Final  05/13/2014 11.7 7.0 - 26.0 mg/dL Final   Creatinine  Date Value Ref Range Status  03/01/2017 1.3 (H) 0.6 - 1.1 mg/dL Final  01/20/2016 0.9 0.6 - 1.1 mg/dL Final  01/06/2015 1.3 (H) 0.6 - 1.1 mg/dL Final  09/22/2014 1.4 (A) 0.5 - 1.1 mg/dL Final  06/24/2014 0.9 0.5 - 1.1 mg/dL Final  05/13/2014 1.0 0.6 - 1.1 mg/dL Final   Creatinine, Ser  Date Value Ref Range Status  03/16/2017 1.63 (H) 0.44 - 1.00 mg/dL Final  09/14/2015 0.81 0.40 - 1.20 mg/dL Final  06/10/2015 1.00 0.40 - 1.20 mg/dL Final     EKG  EKG Interpretation None       Radiology Dg Thoracic Spine 2 View  Result Date: 03/16/2017 CLINICAL DATA:  Pt fell on L hip after slipping on bandaged part of L foot while searching for remote. Reports L lateral hip pain and lower back pain. Pt also states she has neuropathy to lower left leg  and is unsure if she broke anything. EXAM: THORACIC SPINE 2 VIEWS COMPARISON:  08/25/2016 FINDINGS: Body habitus and motion artifact reduced diagnostic sensitivity and specificity. Thoracic spondylosis. Upper thoracic spine not well depicted due to the body habitus and patient's shoulders. No obvious compression in the mid and lower thoracic spine. IMPRESSION: 1. No definite fracture or subluxation, upper thoracic spine not well seen on the lateral projection due to body habitus and motion artifact. 2. Mild thoracic spondylosis. Electronically Signed   By: Van Clines M.D.   On: 03/16/2017 19:11   Dg Lumbar Spine Complete  Result Date: 03/16/2017 CLINICAL DATA:  Pt fell on L hip after slipping on bandaged part of L foot while searching for remote. Reports L lateral hip pain and lower back pain. Pt also states she has neuropathy to lower left leg and is unsure if she broke anything. EXAM: LUMBAR SPINE - COMPLETE 4+ VIEW COMPARISON:  11/11/2012 CT abdomen FINDINGS: PLIF at T5-3 without complicating feature. Mild bony demineralization. The arcuate lines of the sacrum appear continuous. SI joints unremarkable. Clustered density in the right upper quadrant compatible with mulberry type gallstone. Bony detail is lost on the lateral projection due to body habitus, but there is no obvious vertebral collapse or malalignment. IMPRESSION: 1. No acute lumbar spine findings. 2. 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 3. Remote PLIF at L4-5. Electronically Signed   By: Van Clines M.D.   On: 03/16/2017 19:13   Dg Tibia/fibula Left  Result Date: 03/16/2017 CLINICAL DATA:  Pt fell on L hip after slipping on bandaged part of L foot while searching for remote. Reports L lateral hip pain and lower back pain. Pt also states she has neuropathy to lower left leg and is unsure if she broke anything. EXAM: LEFT TIBIA AND FIBULA - 2 VIEW COMPARISON:  Knee radiographs from 08/22/2012 FINDINGS: Periosteal  reaction and some deformity in the mid tibia and fibula, but with a chronic appearance possibly related to prior fractures and/ or hardware. Extensive bony hindfoot deformity, with presumed fracture or collapse of the talus, and fusion of the distal tibia and fibula with the calcaneus through 2 lag screws. There is considerable lucency around both lag screws suggesting loosening or infection,  and I can't exclude pseudoarticulation of the distal tibia and fibula with the irregular and partially collapsed calcaneus. Severe degeneration of the Chopart joint and poor visualization of the cuboid. Calcifications along the expected location of the Achilles tendon. IMPRESSION: 1. Extensive hindfoot deformity and collapse. Lucency around the hindfoot lag screws suggesting loosening or infection. 2. Chronic appearing periostitis in the mid mid shafts of the tibia and fibula extending to the ankle, with midshaft deformities possibly related to prior hardware. Electronically Signed   By: Van Clines M.D.   On: 03/16/2017 19:17   Dg Foot Complete Left  Result Date: 03/16/2017 CLINICAL DATA:  Pt fell on L hip after slipping on bandaged part of L foot while searching for remote. Reports L lateral hip pain and lower back pain. Pt also states she has neuropathy to lower left leg and is unsure if she broke anything. EXAM: LEFT FOOT - COMPLETE 3+ VIEW COMPARISON:  None. FINDINGS: Hindfoot collapse with nonvisualization of the talus, collapse and partial fragmentation of the navicular, severe arthropathy at the Chopart joint, severe arthropathy in the dorsal midfoot, and attempted fusion of the flattened calcaneus with the distal tibia and fibula with lucency along the rib lag screws suggesting loosening or infection. Cough probable pseudarthrosis at the articulation between the tibia/ fibula and the flattened calcaneus. Ossific fragments along the expected Achilles tendon location. Bony demineralization. No malalignment at  the Lisfranc joint. Transverse indistinct lucency in the distal phalanx great toe, age indeterminate fracture. IMPRESSION: 1. Complex collapse of the hindfoot and part of the midfoot. Probable loosening along the lag screws extending through the collapsed calcaneus into the tibia, and pseudarthrosis between the tibia/fibula and the collapsed calcaneus. 2. Age-indeterminate fracture of the distal phalanx great toe. 3. Bony demineralization. Electronically Signed   By: Van Clines M.D.   On: 03/16/2017 19:20   Dg Hip Unilat With Pelvis 2-3 Views Left  Result Date: 03/16/2017 CLINICAL DATA:  Pt fell on L hip after slipping on bandaged part of L foot while searching for remote. Reports L lateral hip pain and lower back pain. Pt also states she has neuropathy to lower left leg and is unsure if she broke anything. EXAM: DG HIP (WITH OR WITHOUT PELVIS) 2-3V LEFT COMPARISON:  None FINDINGS: PLIF at L4-5. Comminuted left intertrochanteric hip fracture, with separate lesser trochanteric fragment. Varus angulation at the fracture site. Bony demineralization in the pelvis. IMPRESSION: 1. Comminuted left hip intertrochanteric fracture. Electronically Signed   By: Van Clines M.D.   On: 03/16/2017 19:10    Procedures Procedures (including critical care time)  Medications Ordered in ED Medications  morphine 4 MG/ML injection 4 mg (not administered)  sodium chloride 0.9 % bolus 1,000 mL (not administered)  fentaNYL (SUBLIMAZE) injection 25 mcg (25 mcg Intravenous Given 03/16/17 1858)  morphine 4 MG/ML injection 4 mg (4 mg Intravenous Given 03/16/17 1945)     Initial Impression / Assessment and Plan / ED Course  I have reviewed the triage vital signs and the nursing notes.  Pertinent labs & imaging results that were available during my care of the patient were reviewed by me and considered in my medical decision making (see chart for details).  Clinical Course as of Mar 17 2039  Fri Mar 16, 2017    1935 Spoke with Dr. Wynelle Link, orthopedic surgeon, who requests medical admission and he will consult.  [SJ]  2030 Spoke with Dr. Roel Cluck, hospitalist, who agreed to admit the patient.  [SJ]  Clinical Course User Index [SJ] Lorayne Bender, PA-C    Patient presents after a mechanical fall. Comminuted intertrochanteric left hip fracture on x-ray. Admission via medicine with orthopedic consult. AKI noted and addressed. Anemia also noted and will be monitored. No indication for transfusion presently.  Findings and plan of care discussed with Antony Blackbird, MD. Dr. Sherry Ruffing personally evaluated and examined this patient.  Dr. Tonita Cong from [facility] orthopedics performed patient's lumbar spine surgery.  Final Clinical Impressions(s) / ED Diagnoses   Final diagnoses:  Fall  Closed comminuted fracture of left hip, initial encounter ([place])  AKI (acute kidney injury) [facility])    New Prescriptions New Prescriptions   No medications on file     Layla Maw 03/16/17 2041    Tegeler, Gwenyth Allegra, MD 03/26/17 [phone]

## 2017-03-16 NOTE — H&P (Addendum)
[name]:[id number] DOB: 09/29/1951 DOA: 03/16/2017     PCP: [name], MD   Outpatient Specialists: Magrinat  Patient coming from:   home Lives   With family   Chief Complaint: left hip pain  HPI: Daisy Larson is a 66 y.o. female with medical history significant of invasive ductal carcinoma, grade 2 diagnosed in 2013, extensive foot surgery,   renal failure requiring temporary hemodialysis (currently resolved), nonalcoholic cryptogenic cirrhosis, portal hypertension, hepatic encephalopathy , diabetes mellitus, depression, GERD, HTN, OSA, Diabetic Charct's arthropathy   Presented with mechanical fall as she slipped on the floor tripping on her bandages from her left foot wound. She fell on her buttocks lower back and left hip She was up and looking for TV remote. No LOC no trauma she is not on any anticoagulation EMS was called. 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  Reports no fever.   Patient she have had numerous complications with Charcot  "joint of the left foot with retained orthopedic hardware in ongoing infection patient is wheelchair bound at baseline. On 02/02/17 she undergone left heel wound debridement and placement of antibiotic beads. She continued to have some discharge from the wound which is green and milky. She was seen for this on May 11 and started on Bactrim double strength twice a day and follow-up with 1 week    Of note Per Patient - she has been told she can not have general anesthesia "unless life-threatening  Regarding pertinent Chronic problems: Breast cancer estrogen receptor positive sp Lumpectomy sp radiation and anastrozole followed by oncology currently in remission.  In March patient was admitted to [facility] with acute renal failure requiring hemodialysis  Osteopenia on zolendronate yearly  last infusion 03/02/2107  IN ER:  Temp (24hrs), Avg:98.3 F (36.8 C), Min:98.3 F (36.8 C), Max:98.3 F (36.8 C)       RR 18 100% BP 116/57 Na 140 K 5.2 Cr 1.63 up from 1.3 on the 3rd of May Alb 2.6 WBC 5.0 Hg 8.7 down from 9.5 on the 3rd of May  Plain films: of left foot Extensive bony hindfoot deformity Lucency around the hindfoot lag screws suggesting loosening or infection  LEft hip: Comminuted left hip intertrochanteric fracture Spine non acute Following Medications were ordered in ER: Medications  morphine 4 MG/ML injection 4 mg (not administered)  fentaNYL (SUBLIMAZE) injection 25 mcg (25 mcg Intravenous Given 03/16/17 1858)  morphine 4 MG/ML injection 4 mg (4 mg Intravenous Given 03/16/17 1945)     ER provider discussed case with:  Luseo with orthopedcis who will see patietn in consult  Hospitalist was called for admission for LEFT hip Fracture  Review of Systems:    Pertinent positives include: fall, left hip pain  Constitutional:  No weight loss, night sweats, Fevers, chills, fatigue, weight loss  HEENT:  No headaches, Difficulty swallowing,Tooth/dental problems,Sore throat,  No sneezing, itching, ear ache, nasal congestion, post nasal drip,  Cardio-vascular:  No chest pain, Orthopnea, PND, anasarca, dizziness, palpitations.no Bilateral lower extremity swelling  GI:  No heartburn, indigestion, abdominal pain, nausea, vomiting, diarrhea, change in bowel habits, loss of appetite, melena, blood in stool, hematemesis Resp:  no shortness of breath at rest. No dyspnea on exertion, No excess mucus, no productive cough, No non-productive cough, No coughing up of blood.No change in color of mucus.No wheezing. Skin:  no  rash or lesions. No jaundice GU:  no dysuria, change in color of urine, no urgency or frequency. No straining to urinate.  No flank pain.  Musculoskeletal:  No joint pain or no joint swelling. No decreased range of motion. No back pain.  Psych:  No  change in mood or affect. No depression or anxiety. No memory loss.  Neuro: no localizing neurological complaints, no tingling, no weakness, no double vision, no gait abnormality, no slurred speech, no confusion  As per HPI otherwise 10 point review of systems negative.   Past Medical History: Past Medical History:  Diagnosis Date  . Allergy    tape  . Angina    COSTOCONDRITIS    . Anxiety   . Bell's palsy 2014   twice  . Breast cancer ([place]) 01/19/12    right breast lumpectomy/ER/PR positibe,her-2 neg.  . Breast cancer ([place]) 2011  . Bundle branch block   . Cirrhosis ([place])   . Colon polyps 12/02/2012   TUBULAR ADENOMAS (X2) and Hyperplastic (X1)  . Complication of anesthesia    WITH SECON HERNIA REPAIR 2012 [facility]  DIFFICULTY O2 SAT DROPPING ADMIT TO HOSPITAL   . Depression   . Diabetes mellitus   . Diverticulosis   . External hemorrhoids   . Gastritis   . GERD (gastroesophageal reflux disease)    ULCERS  . Goiter   . Headache(784.0)    MIGRAINES  . Heart murmur   . Hepatitis     Did Not have hepatitis but needed Hep A and Hep B injections2012 SPOTS ON LIVER  DR Alonza Bogus  [phone]  . Hernia   . History of stomach ulcers   . Hypertension   . Hyperthyroidism    NODULES ON THYROID  (DR BALEN 37  12-609)  . Iron deficiency anemia 10/16/2012  . Multiple thyroid nodules   . Neuromuscular disorder ([place])    PERIPH NEUROPATHY   . Neuropathy of foot   . Restless legs   . S/P radiation therapy 02/19/12 - 04/01/12   Right Breast: 5000 cgy/25 Fractions with Boost/ 1000 cGy/5 Fractions  . Short of breath on exertion   . Shortness of breath    WITH EXERTION   . Sleep apnea    Past Surgical History:  Procedure Laterality Date  . ABDOMINAL HYSTERECTOMY    . ANAL FISSURE REPAIR    . APPENDECTOMY    . BACK SURGERY    . BREAST SURGERY     BIOPSY  Right Breast -  Sentinel Lymph Nodes  . CARPAL TUNNEL RELEASE     LEFT   . CESAREAN SECTION    . COLONOSCOPY W/ POLYPECTOMY   12/02/2012  . ESOPHAGOGASTRODUODENOSCOPY  12/02/2012   Gastritis  . HAND SURGERY Bilateral   . HERNIA REPAIR     X 2  . LIVER BIOPSY  03/28/11   [facility] - Cirrhosis  . SPINAL FUSION    . TONSILLECTOMY    . TUBAL LIGATION       Social History:  Ambulatory   wheelchair bound,     reports that she has never smoked. She has never used smokeless tobacco. She reports that she does not drink alcohol or use drugs.  Allergies:   Allergies  Allergen Reactions  . Other Rash    Blisters Per Patient - she has been told she can not have general anesthesia "unless life-threatening"  . Tape   . Oxymorphone     lethargic  . Barbiturates Dermatitis and Other (  See Comments)    Drowsiness, patient states that Barbiturates & Narcotics make her extremely sleepy and drowsy 24 Apr 2013 Drowsiness  "sleeps forever"       Family History:   Family History  Problem Relation Age of Onset  . Diabetes Mother   . Hypertension Mother   . Heart disease Father   . Hypertension Father   . Hypertension Other   . Heart disease Other   . Lung cancer Maternal Grandfather   . Colon cancer Paternal Grandfather   . Heart disease Maternal Uncle   . Diabetes Sister   . Heart disease Sister   . Thyroid disease Sister   . Diabetes Daughter     Medications: Prior to Admission medications   Medication Sig Start Date End Date Taking? Authorizing Provider  anastrozole (ARIMIDEX) 1 MG tablet Take 1 tablet (1 mg total) by mouth daily. 01/25/16  Yes Magrinat, Virgie Dad, MD  aspirin 81 MG tablet Take 81 mg by mouth daily.   Yes [provider]  b complex vitamins capsule Take 1 capsule by mouth daily.    Yes [provider]  buPROPion (WELLBUTRIN XL) 300 MG 24 hr tablet Take 1 tablet by mouth Daily. 07/08/12  Yes [provider]  calcium carbonate (OS-CAL) 600 MG TABS tablet Take 1,200 mg by mouth.   Yes [provider]  Cinnamon 500 MG capsule Take 500 mg by  mouth daily.    Yes [provider]  citalopram (CELEXA) 20 MG tablet Take 1 tablet by mouth daily. 03/13/15  Yes [provider]  DULoxetine (CYMBALTA) 60 MG capsule Take 60 mg by mouth daily.   Yes [provider]  fluticasone (FLONASE) 50 MCG/ACT nasal spray Frequency:PRN   Dosage:50   MCG  Instructions:  Note:Dose: 50 MCG 05/04/11  Yes [provider]  furosemide (LASIX) 40 MG tablet Take 20 mg by mouth 2 (two) times daily.  02/21/13  Yes [provider]  gabapentin (NEURONTIN) 800 MG tablet 800 mg 3 (three) times daily.  12/21/11  Yes [provider]  glucagon (GLUCAGON EMERGENCY) 1 MG injection Inject 1 mg into the vein once as needed. 05/04/15  Yes Elayne Snare, MD  Grape Seed Extract 50 MG CAPS Take by mouth daily.    Yes [provider]  insulin aspart protamine- aspart (NOVOLOG MIX 70/30) (70-30) 100 UNIT/ML injection Inject 120 Units into the skin daily.   Yes [provider]  insulin regular human CONCENTRATED (HUMULIN R) 500 UNIT/ML SOLN injection Use three times daily with meals per sliding scale. Max dose 80 units per day. This is equivalent to 400 units of U-100 insulin per day. 04/07/15  Yes Phadke, Karsten Ro, MD  lactulose (CEPHULAC) 20 G packet Take 20 g by mouth 3 (three) times daily.   Yes [provider]  lisinopril (PRINIVIL,ZESTRIL) 20 MG tablet Take 20 mg by mouth every morning. Take in the morning 11/24/11  Yes [provider]  loratadine (CLARITIN) 10 MG tablet Take 10 mg by mouth daily.   Yes [provider]  Magnesium Oxide 400 MG CAPS Take 400 mg by mouth daily.    Yes [provider]  meclizine (ANTIVERT) 12.5 MG tablet Take 12.5 mg by mouth 3 (three) times daily as needed.   Yes [provider]  Milk Thistle 500 MG CAPS Take 1,000 mg by mouth daily.  05/04/11  Yes [provider]  MULTIPLE VITAMIN PO Take 1 tablet by mouth daily.  Yes [provider]  Omega-3 Fatty Acids (EQL OMEGA 3 FISH OIL) 1400 MG CAPS Take 1 capsule by mouth daily.   Yes [provider]  pantoprazole (PROTONIX) 40 MG tablet Take 40 mg by mouth at bedtime.  05/05/14  Yes [provider]  polyethylene glycol (MIRALAX / GLYCOLAX) packet Take 17 g by mouth daily.   Yes [provider]  pramipexole (MIRAPEX) 1 MG tablet Take 2 mg by mouth at bedtime.    Yes [provider]  propranolol (INDERAL LA) 60 MG 24 hr capsule Take 60 mg by mouth daily. Take at night 11/23/11  Yes [provider]  spironolactone (ALDACTONE) 50 MG tablet Take 50 mg by mouth 2 (two) times daily.  04/30/13  Yes [provider]  sulfamethoxazole-trimethoprim (BACTRIM DS,SEPTRA DS) 800-160 MG tablet Take 1 tablet by mouth 2 (two) times daily. 03/09/17  Yes [provider]  traMADol (ULTRAM) 50 MG tablet Take 1 tablet (50 mg total) by mouth every 6 (six) hours as needed for moderate pain or severe pain. 08/26/15  Yes Wofford, Jenny Reichmann, MD  XIFAXAN 550 MG TABS tablet Take 1 tablet by mouth 2 (two) times daily. 04/06/15  Yes [provider]  [place] Medication Name: Nitroglycerin ointment 0.125% Apply pea-size amount internally 4 times daily 03/04/15   Pyrtle, Lajuan Lines, MD  BD INSULIN SYRINGE ULTRAFINE 31G X 5/16" 0.3 ML MISC USE  TO INJECT  INSULIN THREE TIMES DAILY 01/10/17   Elayne Snare, MD  glucose blood test strip Use as instructed to check blood sugar 4 times per day Dx: E11.65 09/01/15   Elayne Snare, MD  [facility] DELICA LANCETS 26J MISC Use to test sugars 4 x daily. 02/19/15   Haydee Monica, MD    Physical Exam: Patient Vitals for the past 24 hrs:  BP Temp Temp src Pulse Resp SpO2 Height Weight  03/16/17 1948 (!) 116/57 - - 63 18 100 % - -  03/16/17 1900 (!) 112/53 - - 64 18 100 % - -  03/16/17 1727 - - - - - - '5\' 8"'  (1.727 m) 111.1 kg (245 lb)  03/16/17 1725 (!) 109/52 98.3 F (36.8 C) Oral 60 20 100 % '5\' 8"'   (1.727 m) 111.1 kg (245 lb)    1. General:  in No Acute distress 2. Psychological: Alert and  Oriented 3. Head/ENT:   Moist  Mucous Membranes                          Head Non traumatic, neck supple                           Poor Dentition 4. SKIN:   decreased Skin turgor,  Skin clean Dry and intact no rash 5. Heart: Regular rate and rhythm no Murmur, Rub or gallop 6. Lungs:   no wheezes or crackles   7. Abdomen: Soft,  non-tender, Non distended obese 8. Lower extremities: no clubbing, cyanosis, or edema 9. Neurologically Grossly intact, moving all 4 extremities equally   10. MSK: decreased range of motion left LE   body mass index is 37.25 kg/m.  Labs on Admission:   Labs on Admission: I have personally reviewed following labs and imaging studies  CBC:  Recent Labs Lab 03/16/17 1903  WBC 5.0  NEUTROABS 3.5  HGB 8.7*  HCT 26.8*  MCV 97.8  PLT 112*   Basic  Metabolic Panel:  Recent Labs Lab 03/16/17 1903  NA 140  K 5.2*  CL 106  CO2 26  GLUCOSE 235*  BUN 19  CREATININE 1.63*  CALCIUM 8.9   GFR: Estimated Creatinine Clearance: 44.4 mL/min (A) (by C-G formula based on SCr of 1.63 mg/dL (H)). Liver Function Tests:  Recent Labs Lab 03/16/17 1903  AST 52*  ALT 28  ALKPHOS 162*  BILITOT 0.6  PROT 6.6  ALBUMIN 2.6*   No results for input(s): LIPASE, AMYLASE in the last 168 hours. No results for input(s): AMMONIA in the last 168 hours. Coagulation Profile: No results for input(s): INR, PROTIME in the last 168 hours. Cardiac Enzymes: No results for input(s): CKTOTAL, CKMB, CKMBINDEX, TROPONINI in the last 168 hours. BNP (last 3 results) No results for input(s): PROBNP in the last 8760 hours. HbA1C: No results for input(s): HGBA1C in the last 72 hours. CBG: No results for input(s): GLUCAP in the last 168 hours. Lipid Profile: No results for input(s): CHOL, HDL, LDLCALC, TRIG, CHOLHDL, LDLDIRECT in the last 72 hours. Thyroid Function Tests: No results  for input(s): TSH, T4TOTAL, FREET4, T3FREE, THYROIDAB in the last 72 hours. Anemia Panel: No results for input(s): VITAMINB12, FOLATE, FERRITIN, TIBC, IRON, RETICCTPCT in the last 72 hours. Urine analysis:   '@LABRCNTIP' (procalcitonin:4,lacticidven:4) )No results found for this or any previous visit (from the past 240 hour(s)).    UA  not ordered  Lab Results  Component Value Date   HGBA1C 6.5 06/10/2015    Estimated Creatinine Clearance: 44.4 mL/min (A) (by C-G formula based on SCr of 1.63 mg/dL (H)).  BNP (last 3 results) No results for input(s): PROBNP in the last 8760 hours.   ECG REPORT  Independently reviewed Rate: 129  Rhythm: sinus rhythm ST&T Change: No acute ischemic changes   QTC 551  Filed Weights   03/16/17 1725 03/16/17 1727  Weight: 111.1 kg (245 lb) 111.1 kg (245 lb)     Cultures: No results found for: SDES, SPECREQUEST, CULT, REPTSTATUS   Radiological Exams on Admission: Dg Thoracic Spine 2 View  Result Date: 03/16/2017 CLINICAL DATA:  Pt fell on L hip after slipping on bandaged part of L foot while searching for remote. Reports L lateral hip pain and lower back pain. Pt also states she has neuropathy to lower left leg and is unsure if she broke anything. EXAM: THORACIC SPINE 2 VIEWS COMPARISON:  08/25/2016 FINDINGS: Body habitus and motion artifact reduced diagnostic sensitivity and specificity. Thoracic spondylosis. Upper thoracic spine not well depicted due to the body habitus and patient's shoulders. No obvious compression in the mid and lower thoracic spine. IMPRESSION: 1. No definite fracture or subluxation, upper thoracic spine not well seen on the lateral projection due to body habitus and motion artifact. 2. Mild thoracic spondylosis. Electronically Signed   By: Van Clines M.D.   On: 03/16/2017 19:11   Dg Lumbar Spine Complete  Result Date: 03/16/2017 CLINICAL DATA:  Pt fell on L hip after slipping on bandaged part of L foot while searching  for remote. Reports L lateral hip pain and lower back pain. Pt also states she has neuropathy to lower left leg and is unsure if she broke anything. EXAM: LUMBAR SPINE - COMPLETE 4+ VIEW COMPARISON:  11/11/2012 CT abdomen FINDINGS: PLIF at Z6-6 without complicating feature. Mild bony demineralization. The arcuate lines of the sacrum appear continuous. SI joints unremarkable. Clustered density in the right upper quadrant compatible with mulberry type gallstone. Bony detail is lost on the  lateral projection due to body habitus, but there is no obvious vertebral collapse or malalignment. IMPRESSION: 1. No acute lumbar spine findings. 2. 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 3. Remote PLIF at L4-5. Electronically Signed   By: Van Clines M.D.   On: 03/16/2017 19:13   Dg Tibia/fibula Left  Result Date: 03/16/2017 CLINICAL DATA:  Pt fell on L hip after slipping on bandaged part of L foot while searching for remote. Reports L lateral hip pain and lower back pain. Pt also states she has neuropathy to lower left leg and is unsure if she broke anything. EXAM: LEFT TIBIA AND FIBULA - 2 VIEW COMPARISON:  Knee radiographs from 08/22/2012 FINDINGS: Periosteal reaction and some deformity in the mid tibia and fibula, but with a chronic appearance possibly related to prior fractures and/ or hardware. Extensive bony hindfoot deformity, with presumed fracture or collapse of the talus, and fusion of the distal tibia and fibula with the calcaneus through 2 lag screws. There is considerable lucency around both lag screws suggesting loosening or infection, and I can't exclude pseudoarticulation of the distal tibia and fibula with the irregular and partially collapsed calcaneus. Severe degeneration of the Chopart joint and poor visualization of the cuboid. Calcifications along the expected location of the Achilles tendon. IMPRESSION: 1. Extensive hindfoot deformity and collapse. Lucency around the hindfoot lag  screws suggesting loosening or infection. 2. Chronic appearing periostitis in the mid mid shafts of the tibia and fibula extending to the ankle, with midshaft deformities possibly related to prior hardware. Electronically Signed   By: Van Clines M.D.   On: 03/16/2017 19:17   Dg Foot Complete Left  Result Date: 03/16/2017 CLINICAL DATA:  Pt fell on L hip after slipping on bandaged part of L foot while searching for remote. Reports L lateral hip pain and lower back pain. Pt also states she has neuropathy to lower left leg and is unsure if she broke anything. EXAM: LEFT FOOT - COMPLETE 3+ VIEW COMPARISON:  None. FINDINGS: Hindfoot collapse with nonvisualization of the talus, collapse and partial fragmentation of the navicular, severe arthropathy at the Chopart joint, severe arthropathy in the dorsal midfoot, and attempted fusion of the flattened calcaneus with the distal tibia and fibula with lucency along the rib lag screws suggesting loosening or infection. Cough probable pseudarthrosis at the articulation between the tibia/ fibula and the flattened calcaneus. Ossific fragments along the expected Achilles tendon location. Bony demineralization. No malalignment at the Lisfranc joint. Transverse indistinct lucency in the distal phalanx great toe, age indeterminate fracture. IMPRESSION: 1. Complex collapse of the hindfoot and part of the midfoot. Probable loosening along the lag screws extending through the collapsed calcaneus into the tibia, and pseudarthrosis between the tibia/fibula and the collapsed calcaneus. 2. Age-indeterminate fracture of the distal phalanx great toe. 3. Bony demineralization. Electronically Signed   By: Van Clines M.D.   On: 03/16/2017 19:20   Dg Hip Unilat With Pelvis 2-3 Views Left  Result Date: 03/16/2017 CLINICAL DATA:  Pt fell on L hip after slipping on bandaged part of L foot while searching for remote. Reports L lateral hip pain and lower back pain. Pt also states  she has neuropathy to lower left leg and is unsure if she broke anything. EXAM: DG HIP (WITH OR WITHOUT PELVIS) 2-3V LEFT COMPARISON:  None FINDINGS: PLIF at L4-5. Comminuted left intertrochanteric hip fracture, with separate lesser trochanteric fragment. Varus angulation at the fracture site. Bony demineralization in the pelvis. IMPRESSION: 1.  Comminuted left hip intertrochanteric fracture. Electronically Signed   By: Van Clines M.D.   On: 03/16/2017 19:10    Chart has been reviewed    Assessment/Plan  66 y.o. female with medical history significant of invasive ductal carcinoma, grade 2 diagnosed in 2013, extensive foot surgery,   renal failure requiring temporary hemodialysis (currently resolved), nonalcoholic cryptogenic cirrhosis, portal hypertension, hepatic encephalopathy , diabetes mellitus, depression, GERD, HTN, OSA, Diabetic Charct's arthropathy admitted for left hip fracture  Present on Admission: . Hip fracture ([place]) - patient with multiple medical problems unable to assess MET. A she is moderate to intermediate risk denies current chest pain or shortness of breath, has had orthopedic procedures in the past that she tolerated well. Patient have had trouble waking up from general anesthesia in the past likely secondary to renal insufficiency as well as hepatic insufficiency would take his in consideration regarding choice of anesthesia. Otherwise as per orthopedics . Essential hypertension stable continue home medications . Type 2 diabetes, uncontrolled, with neuropathy ([place]) order sliding scale change70/30 to Lantus . Hyperkalemia - mild will hold potassium and repeat hold spironolactone for now . Thrombocytopenia, unspecified ([place]) - likely secondary to cirrhosis stable . RESTLESS LEGS SYNDROME - continue home medications . OSA (obstructive sleep apnea) -CPAP ordered . Diabetic polyneuropathy ([facility]) stable chronic continue Neurontin . Anemia - orthopedics will transfuse 1  unit prior to or . Charcot ankle, left - has been treated outpatient by podiatry continue Bactrim . 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 Prolonged QT interval - - will monitor on tele avoid QT prolonging medications, rehydrate correct electrolytes History of breast cancer currently in remission no longer on chemotherapy discussed with oncology   aware of the patient   Other plan as per orders.  DVT prophylaxis:  SCD       Code Status:  FULL CODE   as per patient    Family Communication:   Family  at  Bedside  plan of care was discussed with  Daughter Husband  Disposition Plan:    likely will need placement for rehabilitation                          Would benefit from PT/OT eval prior to DC  Order after been cleared by orthopedics                       Social Work    Diabetes coordinator    Nutrition  consulted                          Consults called: Orthpedics    Admission status:   inpatient       Level of care    tele          I have spent a total of 66 min on this admission   extra time was spent to discuss case with orthopedics  [place] 03/16/2017, 11:49 PM    [facility]  Pager [phone]   after 2 AM please page floor coverage PA If 7AM-7PM, please contact the day team taking care of the patient  [email or web address]  Password [email or web address]

## 2017-03-16 NOTE — Consult Note (Signed)
Reason for Consult:Left femur fracture Referring Physician: Dr. Delila Pereyra Daisy Larson is an 66 y.o. female.  HPI: 66 yo female who was walking approximately 10 foot distance and had a mechanical fall onto her left side. Denies any LOC, dizziness, light headedness, SOB, chest pain, palpitations, headache, leading to fall. Did not have head injury with fall. Main complaint is left hip pain. Also has developed pain in anterior left shoulder since arrival to hospital. Has severe neuropathy left leg thus does not know if she has any new numbness or tingling in the leg. Able to move toes. 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 She was supposed to be wearing a boot on her left foot for ambulation but was not wearing it when she fell tonight.We are consulted for management of her femur fracture  Past Medical History:  Diagnosis Date  . Allergy    tape  . Angina    COSTOCONDRITIS    . Anxiety   . Bell's palsy 2014   twice  . Breast cancer ([place]) 01/19/12    right breast lumpectomy/ER/PR positibe,her-2 neg.  . Breast cancer ([place]) 2011  . Bundle branch block   . Cirrhosis ([place])   . Colon polyps 12/02/2012   TUBULAR ADENOMAS (X2) and Hyperplastic (X1)  . Complication of anesthesia    WITH SECON HERNIA REPAIR 2012 [facility]  DIFFICULTY O2 SAT DROPPING ADMIT TO HOSPITAL   . Depression   . Diabetes mellitus   . Diverticulosis   . External hemorrhoids   . Gastritis   . GERD (gastroesophageal reflux disease)    ULCERS  . Goiter   . Headache(784.0)    MIGRAINES  . Heart murmur   . Hepatitis     Did Not have hepatitis but needed Hep A and Hep B injections2012 SPOTS ON LIVER  DR Alonza Bogus  [phone]  . Hernia   . History of stomach ulcers   . Hypertension   . Hyperthyroidism    NODULES ON THYROID  (DR BALEN 37  12-609)  . Iron deficiency anemia 10/16/2012  . Multiple thyroid nodules   . Neuromuscular disorder ([place])     PERIPH NEUROPATHY   . Neuropathy of foot   . Restless legs   . S/P radiation therapy 02/19/12 - 04/01/12   Right Breast: 5000 cgy/25 Fractions with Boost/ 1000 cGy/5 Fractions  . Short of breath on exertion   . Shortness of breath    WITH EXERTION   . Sleep apnea     Past Surgical History:  Procedure Laterality Date  . ABDOMINAL HYSTERECTOMY    . ANAL FISSURE REPAIR    . APPENDECTOMY    . BACK SURGERY    . BREAST SURGERY     BIOPSY  Right Breast -  [facility] Lymph Nodes  . CARPAL TUNNEL RELEASE     LEFT   . CESAREAN SECTION    . COLONOSCOPY W/ POLYPECTOMY  12/02/2012  . ESOPHAGOGASTRODUODENOSCOPY  12/02/2012   Gastritis  . HAND SURGERY Bilateral   . HERNIA REPAIR     X 2  . LIVER BIOPSY  03/28/11   [facility] - Cirrhosis  . SPINAL FUSION    . TONSILLECTOMY    . TUBAL LIGATION      Family History  Problem Relation Age of Onset  . Diabetes Mother   . Hypertension Mother   . Heart disease Father   . Hypertension Father   . Hypertension  Other   . Heart disease Other   . Lung cancer Maternal Grandfather   . Colon cancer Paternal Grandfather   . Heart disease Maternal Uncle   . Diabetes Sister   . Heart disease Sister   . Thyroid disease Sister   . Diabetes Daughter     Social History:  reports that she has never smoked. She has never used smokeless tobacco. She reports that she does not drink alcohol or use drugs.  Allergies:  Allergies  Allergen Reactions  . Other Rash    Blisters Per Patient - she has been told she can not have general anesthesia "unless life-threatening"  . Tape   . Oxymorphone     lethargic  . Barbiturates Dermatitis and Other (See Comments)    Drowsiness, patient states that Barbiturates & Narcotics make her extremely sleepy and drowsy 24 Apr 2013 Drowsiness  "sleeps forever"    Medications: I have reviewed the patient's current medications.  Results for orders placed or performed during the hospital encounter of 03/16/17  (from the past 48 hour(s))  Comprehensive metabolic panel     Status: Abnormal   Collection Time: 03/16/17  7:03 PM  Result Value Ref Range   Sodium 140 135 - 145 mmol/L   Potassium 5.2 (H) 3.5 - 5.1 mmol/L   Chloride 106 101 - 111 mmol/L   CO2 26 22 - 32 mmol/L   Glucose, Bld 235 (H) 65 - 99 mg/dL   BUN 19 6 - 20 mg/dL   Creatinine, Ser 1.63 (H) 0.44 - 1.00 mg/dL   Calcium 8.9 8.9 - 10.3 mg/dL   Total Protein 6.6 6.5 - 8.1 g/dL   Albumin 2.6 (L) 3.5 - 5.0 g/dL   AST 52 (H) 15 - 41 U/L   ALT 28 14 - 54 U/L   Alkaline Phosphatase 162 (H) 38 - 126 U/L   Total Bilirubin 0.6 0.3 - 1.2 mg/dL   GFR calc non Af Amer 32 (L) >60 mL/min   GFR calc Af Amer 37 (L) >60 mL/min    Comment: (NOTE) The eGFR has been calculated using the CKD EPI equation. This calculation has not been validated in all clinical situations. eGFR's persistently <60 mL/min signify possible Chronic Kidney Disease.    Anion gap 8 5 - 15  CBC with Differential     Status: Abnormal   Collection Time: 03/16/17  7:03 PM  Result Value Ref Range   WBC 5.0 4.0 - 10.5 K/uL   RBC 2.74 (L) 3.87 - 5.11 MIL/uL   Hemoglobin 8.7 (L) 12.0 - 15.0 g/dL   HCT 26.8 (L) 36.0 - 46.0 %   MCV 97.8 78.0 - 100.0 fL   MCH 31.8 26.0 - 34.0 pg   MCHC 32.5 30.0 - 36.0 g/dL   RDW 15.0 11.5 - 15.5 %   Platelets 112 (L) 150 - 400 K/uL    Comment: SPECIMEN CHECKED FOR CLOTS PLATELET COUNT CONFIRMED BY SMEAR    Neutrophils Relative % 71 %   Neutro Abs 3.5 1.7 - 7.7 K/uL   Lymphocytes Relative 18 %   Lymphs Abs 0.9 0.7 - 4.0 K/uL   Monocytes Relative 8 %   Monocytes Absolute 0.4 0.1 - 1.0 K/uL   Eosinophils Relative 3 %   Eosinophils Absolute 0.1 0.0 - 0.7 K/uL   Basophils Relative 0 %   Basophils Absolute 0.0 0.0 - 0.1 K/uL  Protime-INR     Status: None   Collection Time: 03/16/17  7:03 PM  Result  Value Ref Range   Prothrombin Time 14.4 11.4 - 15.2 seconds   INR 1.11   Type and screen [place]     Status: None    Collection Time: 03/16/17  7:49 PM  Result Value Ref Range   ABO/RH(D) A POS    Antibody Screen NEG    Sample Expiration 03/19/2017     Dg Chest 1 View  Result Date: 03/16/2017 CLINICAL DATA:  Preop for hip fracture. EXAM: CHEST 1 VIEW COMPARISON:  08/26/2015 FINDINGS: Borderline cardiomegaly. Chronic left sixth and seventh rib fractures with callus. No pneumonic consolidation, effusion or pneumothorax. IMPRESSION: No pneumonic consolidation. Electronically Signed   By: Ashley Royalty M.D.   On: 03/16/2017 21:10   Dg Thoracic Spine 2 View  Result Date: 03/16/2017 CLINICAL DATA:  Pt fell on L hip after slipping on bandaged part of L foot while searching for remote. Reports L lateral hip pain and lower back pain. Pt also states she has neuropathy to lower left leg and is unsure if she broke anything. EXAM: THORACIC SPINE 2 VIEWS COMPARISON:  08/25/2016 FINDINGS: Body habitus and motion artifact reduced diagnostic sensitivity and specificity. Thoracic spondylosis. Upper thoracic spine not well depicted due to the body habitus and patient's shoulders. No obvious compression in the mid and lower thoracic spine. IMPRESSION: 1. No definite fracture or subluxation, upper thoracic spine not well seen on the lateral projection due to body habitus and motion artifact. 2. Mild thoracic spondylosis. Electronically Signed   By: Van Clines M.D.   On: 03/16/2017 19:11   Dg Lumbar Spine Complete  Result Date: 03/16/2017 CLINICAL DATA:  Pt fell on L hip after slipping on bandaged part of L foot while searching for remote. Reports L lateral hip pain and lower back pain. Pt also states she has neuropathy to lower left leg and is unsure if she broke anything. EXAM: LUMBAR SPINE - COMPLETE 4+ VIEW COMPARISON:  11/11/2012 CT abdomen FINDINGS: PLIF at G2-5 without complicating feature. Mild bony demineralization. The arcuate lines of the sacrum appear continuous. SI joints unremarkable. Clustered density in the  right upper quadrant compatible with mulberry type gallstone. Bony detail is lost on the lateral projection due to body habitus, but there is no obvious vertebral collapse or malalignment. IMPRESSION: 1. No acute lumbar spine findings. 2. 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 3. Remote PLIF at L4-5. Electronically Signed   By: Van Clines M.D.   On: 03/16/2017 19:13   Dg Tibia/fibula Left  Result Date: 03/16/2017 CLINICAL DATA:  Pt fell on L hip after slipping on bandaged part of L foot while searching for remote. Reports L lateral hip pain and lower back pain. Pt also states she has neuropathy to lower left leg and is unsure if she broke anything. EXAM: LEFT TIBIA AND FIBULA - 2 VIEW COMPARISON:  Knee radiographs from 08/22/2012 FINDINGS: Periosteal reaction and some deformity in the mid tibia and fibula, but with a chronic appearance possibly related to prior fractures and/ or hardware. Extensive bony hindfoot deformity, with presumed fracture or collapse of the talus, and fusion of the distal tibia and fibula with the calcaneus through 2 lag screws. There is considerable lucency around both lag screws suggesting loosening or infection, and I can't exclude pseudoarticulation of the distal tibia and fibula with the irregular and partially collapsed calcaneus. Severe degeneration of the Chopart joint and poor visualization of the cuboid. Calcifications along the expected location of the Achilles tendon. IMPRESSION: 1.  Extensive hindfoot deformity and collapse. Lucency around the hindfoot lag screws suggesting loosening or infection. 2. Chronic appearing periostitis in the mid mid shafts of the tibia and fibula extending to the ankle, with midshaft deformities possibly related to prior hardware. Electronically Signed   By: Van Clines M.D.   On: 03/16/2017 19:17   Dg Foot Complete Left  Result Date: 03/16/2017 CLINICAL DATA:  Pt fell on L hip after slipping on bandaged part of L  foot while searching for remote. Reports L lateral hip pain and lower back pain. Pt also states she has neuropathy to lower left leg and is unsure if she broke anything. EXAM: LEFT FOOT - COMPLETE 3+ VIEW COMPARISON:  None. FINDINGS: Hindfoot collapse with nonvisualization of the talus, collapse and partial fragmentation of the navicular, severe arthropathy at the Chopart joint, severe arthropathy in the dorsal midfoot, and attempted fusion of the flattened calcaneus with the distal tibia and fibula with lucency along the rib lag screws suggesting loosening or infection. Cough probable pseudarthrosis at the articulation between the tibia/ fibula and the flattened calcaneus. Ossific fragments along the expected Achilles tendon location. Bony demineralization. No malalignment at the Lisfranc joint. Transverse indistinct lucency in the distal phalanx great toe, age indeterminate fracture. IMPRESSION: 1. Complex collapse of the hindfoot and part of the midfoot. Probable loosening along the lag screws extending through the collapsed calcaneus into the tibia, and pseudarthrosis between the tibia/fibula and the collapsed calcaneus. 2. Age-indeterminate fracture of the distal phalanx great toe. 3. Bony demineralization. Electronically Signed   By: Van Clines M.D.   On: 03/16/2017 19:20   Dg Hip Unilat With Pelvis 2-3 Views Left  Result Date: 03/16/2017 CLINICAL DATA:  Pt fell on L hip after slipping on bandaged part of L foot while searching for remote. Reports L lateral hip pain and lower back pain. Pt also states she has neuropathy to lower left leg and is unsure if she broke anything. EXAM: DG HIP (WITH OR WITHOUT PELVIS) 2-3V LEFT COMPARISON:  None FINDINGS: PLIF at L4-5. Comminuted left intertrochanteric hip fracture, with separate lesser trochanteric fragment. Varus angulation at the fracture site. Bony demineralization in the pelvis. IMPRESSION: 1. Comminuted left hip intertrochanteric fracture.  Electronically Signed   By: Van Clines M.D.   On: 03/16/2017 19:10    ROS Blood pressure (!) 114/39, pulse 65, temperature 98.3 F (36.8 C), temperature source Oral, resp. rate 15, height _0  (1.727 m), weight 111.1 kg (245 lb), SpO2 95 %. Physical Exam Physical Examination: General appearance - alert, well appearing, and in no distress Mental status - alert, oriented to person, place, and time Neurological - alert, oriented, normal speech, no focal findings or movement disorder noted Left lower extremity slightly short and externally rotated, tender left hip, pelvis stable to compression and distraction, wiggles toes, 2 + DP pulse, pain on any attempted hip motion' abrasion over left knee' no knee effusion or tenderness, has dressing over left foot Slight tenderness over left AC joint. No pain on passive shoulder motion except elevation above 90, no deformity, no tenderness cervical spine,   Assessment/Plan: Left intertrochanteric femur fracture- Will require operative fixation to allow for potential ambulation again. She has multiple medical issues with significant anemia which should be optimized  prior to surgery. Discussed with Dr. Roel Cluck and will order one unit PRBC transfusion now. Surgery will be tomorrow or day after by one of my partners.  Gearlean Alf 03/16/2017, 9:47 PM

## 2017-03-16 NOTE — ED Triage Notes (Signed)
Pt from home via EMS.  Pt fell on L hip after slipping on bandaged part of L foot while searching for remote.  Reports L hip pain and lower back pain.  No obvious deformity or rotation or shortening.  No LOC or head trauma.  Hx cirrhosis & breast cancer & DM.  A&Ox4.  Not on any blood thinners. VS: 128/70, 77 bpm, 98% RA, CBG 209

## 2017-03-16 NOTE — ED Notes (Signed)
Unable to collect labs at this time pt currently not in room

## 2017-03-16 NOTE — ED Notes (Signed)
Rt restricted extremity from cancer, applied band. Applied fall risk band and socks.

## 2017-03-16 NOTE — ED Notes (Signed)
Bed: WA07 Expected date:  Expected time:  Means of arrival:  Comments: Hold for EMS

## 2017-03-16 NOTE — ED Notes (Signed)
Pt unable to urinate with female urinal, states she wants to try again later.

## 2017-03-16 NOTE — ED Notes (Signed)
Pt unable to pee.  States she will let us know when she needs to go

## 2017-03-17 DIAGNOSIS — J9602 Acute respiratory failure with hypercapnia: Secondary | ICD-10-CM

## 2017-03-17 DIAGNOSIS — J9601 Acute respiratory failure with hypoxia: Secondary | ICD-10-CM

## 2017-03-17 LAB — SURGICAL PCR SCREEN
MRSA, PCR: NEGATIVE
Staphylococcus aureus: NEGATIVE

## 2017-03-17 LAB — URINALYSIS, ROUTINE W REFLEX MICROSCOPIC
Bilirubin Urine: NEGATIVE
Glucose, UA: NEGATIVE mg/dL
Hgb urine dipstick: NEGATIVE
KETONES UR: NEGATIVE mg/dL
LEUKOCYTES UA: NEGATIVE
NITRITE: NEGATIVE
PH: 6 (ref 5.0–8.0)
Protein, ur: NEGATIVE mg/dL
Specific Gravity, Urine: 1.011 (ref 1.005–1.030)

## 2017-03-17 LAB — BASIC METABOLIC PANEL
Anion gap: 7 (ref 5–15)
BUN: 17 mg/dL (ref 6–20)
CALCIUM: 8.5 mg/dL — AB (ref 8.9–10.3)
CO2: 27 mmol/L (ref 22–32)
CREATININE: 1.35 mg/dL — AB (ref 0.44–1.00)
Chloride: 103 mmol/L (ref 101–111)
GFR calc non Af Amer: 40 mL/min — ABNORMAL LOW (ref >60)
GFR, EST AFRICAN AMERICAN: 46 mL/min — AB (ref >60)
GLUCOSE: 140 mg/dL — AB (ref 65–99)
Potassium: 5.4 mmol/L — ABNORMAL HIGH (ref 3.5–5.1)
Sodium: 137 mmol/L (ref 135–145)

## 2017-03-17 LAB — CBC
HEMATOCRIT: 31.6 % — AB (ref 36.0–46.0)
Hemoglobin: 10.1 g/dL — ABNORMAL LOW (ref 12.0–15.0)
MCH: 31.5 pg (ref 26.0–34.0)
MCHC: 32 g/dL (ref 30.0–36.0)
MCV: 98.4 fL (ref 78.0–100.0)
Platelets: 125 10*3/uL — ABNORMAL LOW (ref 150–400)
RBC: 3.21 MIL/uL — ABNORMAL LOW (ref 3.87–5.11)
RDW: 15.2 % (ref 11.5–15.5)
WBC: 8.3 10*3/uL (ref 4.0–10.5)

## 2017-03-17 LAB — GLUCOSE, CAPILLARY
GLUCOSE-CAPILLARY: 137 mg/dL — AB (ref 65–99)
GLUCOSE-CAPILLARY: 142 mg/dL — AB (ref 65–99)
GLUCOSE-CAPILLARY: 147 mg/dL — AB (ref 65–99)
Glucose-Capillary: 108 mg/dL — ABNORMAL HIGH (ref 65–99)
Glucose-Capillary: 125 mg/dL — ABNORMAL HIGH (ref 65–99)
Glucose-Capillary: 145 mg/dL — ABNORMAL HIGH (ref 65–99)
Glucose-Capillary: 154 mg/dL — ABNORMAL HIGH (ref 65–99)
Glucose-Capillary: 156 mg/dL — ABNORMAL HIGH (ref 65–99)

## 2017-03-17 LAB — BLOOD GAS, ARTERIAL
ACID-BASE EXCESS: 2.5 mmol/L — AB (ref 0.0–2.0)
Acid-Base Excess: 1.9 mmol/L (ref 0.0–2.0)
Bicarbonate: 28.4 mmol/L — ABNORMAL HIGH (ref 20.0–28.0)
Bicarbonate: 28.4 mmol/L — ABNORMAL HIGH (ref 20.0–28.0)
DELIVERY SYSTEMS: POSITIVE
DRAWN BY: 295031
Drawn by: 103701
EXPIRATORY PAP: 7
FIO2: 40
Inspiratory PAP: 14
Mode: POSITIVE
O2 Content: 2 L/min
O2 SAT: 98 %
O2 Saturation: 93 %
PATIENT TEMPERATURE: 98.6
PCO2 ART: 54 mmHg — AB (ref 32.0–48.0)
PH ART: 7.341 — AB (ref 7.350–7.450)
PO2 ART: 106 mmHg (ref 83.0–108.0)
Patient temperature: 98.6
pCO2 arterial: 58.5 mmHg — ABNORMAL HIGH (ref 32.0–48.0)
pH, Arterial: 7.307 — ABNORMAL LOW (ref 7.350–7.450)
pO2, Arterial: 71.5 mmHg — ABNORMAL LOW (ref 83.0–108.0)

## 2017-03-17 LAB — ALBUMIN: Albumin: 2.7 g/dL — ABNORMAL LOW (ref 3.5–5.0)

## 2017-03-17 LAB — AMMONIA: AMMONIA: 49 umol/L — AB (ref 9–35)

## 2017-03-17 LAB — SODIUM, URINE, RANDOM: Sodium, Ur: 136 mmol/L

## 2017-03-17 LAB — CREATININE, URINE, RANDOM: Creatinine, Urine: 54.73 mg/dL

## 2017-03-17 LAB — ABO/RH: ABO/RH(D): A POS

## 2017-03-17 MED ORDER — CLINDAMYCIN PHOSPHATE 600 MG/50ML IV SOLN
600.0000 mg | Freq: Three times a day (TID) | INTRAVENOUS | Status: DC
  Administered 2017-03-17 – 2017-03-21 (×12): 600 mg via INTRAVENOUS
  Filled 2017-03-17 (×14): qty 50

## 2017-03-17 MED ORDER — CHLORHEXIDINE GLUCONATE 4 % EX LIQD
4.0000 "application " | Freq: Once | CUTANEOUS | Status: DC
  Filled 2017-03-17: qty 60

## 2017-03-17 MED ORDER — MORPHINE SULFATE (PF) 4 MG/ML IV SOLN
1.0000 mg | Freq: Once | INTRAVENOUS | Status: AC
  Administered 2017-03-17: 1 mg via INTRAVENOUS
  Filled 2017-03-17: qty 1

## 2017-03-17 MED ORDER — CEFAZOLIN SODIUM-DEXTROSE 2-4 GM/100ML-% IV SOLN
2.0000 g | INTRAVENOUS | Status: AC
  Administered 2017-03-18: 2 g via INTRAVENOUS
  Filled 2017-03-17: qty 100

## 2017-03-17 MED ORDER — POVIDONE-IODINE 10 % EX SWAB: 2.0000 "application " | Freq: Once | CUTANEOUS | Status: DC

## 2017-03-17 MED ORDER — LACTULOSE 10 GM/15ML PO SOLN
30.0000 g | Freq: Three times a day (TID) | ORAL | Status: DC
  Administered 2017-03-17 – 2017-03-21 (×10): 30 g via ORAL
  Filled 2017-03-17 (×10): qty 45

## 2017-03-17 NOTE — Anesthesia Preprocedure Evaluation (Addendum)
Anesthesia Evaluation  Patient identified by MRN, date of birth, ID band Patient awake    Reviewed: Allergy & Precautions, NPO status , Patient's Chart, lab work & pertinent test results  History of Anesthesia Complications (+) history of anesthetic complications ("sats dropped at [facility]")  Airway Mallampati: III  TM Distance: <3 FB Neck ROM: Full    Dental  (+) Dental Advisory Given, Poor Dentition, Chipped   Pulmonary sleep apnea and Continuous Positive Airway Pressure Ventilation ,    Pulmonary exam normal breath sounds clear to auscultation       Cardiovascular hypertension, Pt. on medications Normal cardiovascular exam Rhythm:Regular Rate:Normal     Neuro/Psych  Headaches, PSYCHIATRIC DISORDERS Anxiety Depression  Neuromuscular disease    GI/Hepatic PUD, GERD  Medicated,(+) Cirrhosis       ,   Endo/Other  diabetes, Insulin DependentHyperthyroidism   Renal/GU Renal InsufficiencyRenal diseaseK+ 5.7     Musculoskeletal  (+) Arthritis ,   Abdominal   Peds  Hematology  (+) Blood dyscrasia, anemia , Plt 125k   Anesthesia Other Findings Day of surgery medications reviewed with the patient.  Right breast cancer s/p lumpectomy, radiation  Reproductive/Obstetrics                           Anesthesia Physical Anesthesia Plan  ASA: III  Anesthesia Plan: General   Post-op Pain Management:    Induction: Intravenous  Airway Management Planned: Oral ETT  Additional Equipment:   Intra-op Plan:   Post-operative Plan: Extubation in OR  Informed Consent:   Plan Discussed with: CRNA  Anesthesia Plan Comments:       Anesthesia Quick Evaluation

## 2017-03-17 NOTE — Progress Notes (Signed)
PROGRESS NOTE  Daisy Larson VOH:[id number] DOB: 07-29-1951 DOA: 03/16/2017 PCP: [name], MD   LOS: 1 day   Brief Narrative: 66 year old female with history of invasive carcinoma, Charcot foot and diabetic arthropathy status post several surgical interventions, complicated by intra-articular infection status post intra-articular bead placement about 3 weeks ago, currently on Bactrim.  She also has a history of nonalcoholic cirrhosis for which she is followed by hepatology at [facility].  She comes to the hospital status post mechanical fall with a hip fracture.  Assessment & Plan: Active Problems:   DM2 (diabetes mellitus, type 2) (HCC)   Type 2 diabetes, uncontrolled, with neuropathy (HCC)   RESTLESS LEGS SYNDROME   Diabetic polyneuropathy (HCC)   Malignant neoplasm of upper-outer quadrant of right breast in female, estrogen receptor positive (HCC)   Anemia   OSA (obstructive sleep apnea)   Thrombocytopenia, unspecified (HCC)   Essential hypertension   Hyperkalemia   Charcot ankle, left   Other cirrhosis of liver (HCC)   Hip fracture (HCC)   Prolonged QT interval   Hip fracture -Orthopedic surgery consulted, appreciate input.  I do not think she is ready for surgery today given acute encephalopathy and hypercarbic respiratory failure requiring stepdown transfer, but anticipate ready for the operating room tomorrow  Liver cirrhosis -Nonalcoholic, now with very mild hepatic encephalopathy -Ammonia level borderline elevated, continue home lactulose as well as rifaximin.  She took doses this morning  Respiratory acidosis with acute hypercarbic respiratory failure -Patient will need BiPAP, transferred to stepdown, because is likely multifactorial she has not wore her CPAP overnight and received morphine for pain which can depress respiratory status and a component of hepatic encephalopathy  Obstructive sleep apnea -Ensure that CPAP is on tonight  Acute  encephalopathy -Likely multifactorial, component of hepatic encephalopathy as well as hypercarbic respiratory failure -Give a trial of BiPAP in stepdown -Minimize morphine  Thrombocytopenia -Likely due to liver cirrhosis currently platelets above 100K  Left foot wounds -This is postsurgery, wound care consulted, continue Betadine as per family recommendations as they have been doing at home, since she is on BiPAP and cannot take Bactrim, prefer clindamycin IV for now  Hyperkalemia -Hold spironolactone, hold Bactrim  Acute kidney injury -?  In the setting of Bactrim, improving -Hold Bactrim for now  Restless leg syndrome -Continue home medications  Diabetes mellitus and diabetic neuropathy -Continue sliding scale, continue Neurontin    DVT prophylaxis: SCDs Code Status: Full code Family Communication: Discussed extensively with patient's daughter and husband at bedside Disposition Plan: Transfer to stepdown  Consultants:   Orthopedic surgery  Procedures:   BiPAP: yes  Antimicrobials:  Bactrim before admission >> 5/19  Clindamycin 5/19>>  Subjective: -Drowsy, confused and lethargic.  Cannot reliably answer any questions  Objective: Vitals:   03/17/17 0807 03/17/17 1158 03/17/17 1200 03/17/17 1204  BP: 131/74     Pulse: 72  73 76  Resp: 18  20   Temp: 97.7 F (36.5 C) 98.3 F (36.8 C)    TempSrc: Axillary Oral    SpO2: 97%  96% 96%  Weight:      Height:        Intake/Output Summary (Last 24 hours) at 03/17/17 1251 Last data filed at 03/17/17 0640  Gross per 24 hour  Intake              360 ml  Output             1550 ml  Net            -  1190 ml   Filed Weights   03/16/17 1725 03/16/17 1727 03/16/17 2306  Weight: 111.1 kg (245 lb) 111.1 kg (245 lb) 111.1 kg (245 lb)    Examination:  Vitals:   03/17/17 0807 03/17/17 1158 03/17/17 1200 03/17/17 1204  BP: 131/74     Pulse: 72  73 76  Resp: 18  20   Temp: 97.7 F (36.5 C) 98.3 F (36.8 C)      TempSrc: Axillary Oral    SpO2: 97%  96% 96%  Weight:      Height:        Constitutional: Lethargic, opens eyes when prompted however falls back asleep when not engaged Eyes: PERRL, lids and conjunctivae normal Respiratory: clear to auscultation bilaterally, no wheezing, no crackles. Normal respiratory effort.  Shallow breathing Cardiovascular: Regular rate and rhythm, no murmurs / rubs / gallops. No LE edema. 2+ pedal pulses.  Abdomen: no tenderness. Bowel sounds positive.  Skin: Left foot with 2 subcentimeter wounds on lateral and medial aspect, no drainage, no surrounding cellulitis Neurologic: Moves all 4 extremities, does not follow commands consistently Psychiatric: Confused, alert to person only   Data Reviewed: I have personally reviewed following labs and imaging studies  CBC:  Recent Labs Lab 03/16/17 1903 03/17/17 0649  WBC 5.0 8.3  NEUTROABS 3.5  --   HGB 8.7* 10.1*  HCT 26.8* 31.6*  MCV 97.8 98.4  PLT 112* 329*   Basic Metabolic Panel:  Recent Labs Lab 03/16/17 1903 03/17/17 0649  NA 140 137  K 5.2* 5.4*  CL 106 103  CO2 26 27  GLUCOSE 235* 140*  BUN 19 17  CREATININE 1.63* 1.35*  CALCIUM 8.9 8.5*   GFR: Estimated Creatinine Clearance: 53.6 mL/min (A) (by C-G formula based on SCr of 1.35 mg/dL (H)). Liver Function Tests:  Recent Labs Lab 03/16/17 1903 03/17/17 0649  AST 52*  --   ALT 28  --   ALKPHOS 162*  --   BILITOT 0.6  --   PROT 6.6  --   ALBUMIN 2.6* 2.7*   No results for input(s): LIPASE, AMYLASE in the last 168 hours.  Recent Labs Lab 03/17/17 0920  AMMONIA 49*   Coagulation Profile:  Recent Labs Lab 03/16/17 1903  INR 1.11   Cardiac Enzymes: No results for input(s): CKTOTAL, CKMB, CKMBINDEX, TROPONINI in the last 168 hours. BNP (last 3 results) No results for input(s): PROBNP in the last 8760 hours. HbA1C: No results for input(s): HGBA1C in the last 72 hours. CBG:  Recent Labs Lab 03/17/17 0018 03/17/17 0340  03/17/17 0728 03/17/17 1124 03/17/17 1200  GLUCAP 125* 108* 137* 142* 154*   Lipid Profile: No results for input(s): CHOL, HDL, LDLCALC, TRIG, CHOLHDL, LDLDIRECT in the last 72 hours. Thyroid Function Tests: No results for input(s): TSH, T4TOTAL, FREET4, T3FREE, THYROIDAB in the last 72 hours. Anemia Panel: No results for input(s): VITAMINB12, FOLATE, FERRITIN, TIBC, IRON, RETICCTPCT in the last 72 hours. Urine analysis:    Component Value Date/Time   COLORURINE YELLOW 03/16/2017 2348   APPEARANCEUR CLEAR 03/16/2017 2348   LABSPEC 1.011 03/16/2017 2348   PHURINE 6.0 03/16/2017 2348   GLUCOSEU NEGATIVE 03/16/2017 2348   GLUCOSEU NEGATIVE 09/14/2015 0808   HGBUR NEGATIVE 03/16/2017 2348   BILIRUBINUR NEGATIVE 03/16/2017 2348   KETONESUR NEGATIVE 03/16/2017 2348   PROTEINUR NEGATIVE 03/16/2017 2348   UROBILINOGEN 1.0 09/14/2015 0808   NITRITE NEGATIVE 03/16/2017 2348   LEUKOCYTESUR NEGATIVE 03/16/2017 2348   Sepsis Labs: Invalid input(s): PROCALCITONIN, LACTICIDVEN  Recent Results (from the past 240 hour(s))  Surgical pcr screen     Status: None   Collection Time: 03/17/17 10:18 AM  Result Value Ref Range Status   MRSA, PCR NEGATIVE NEGATIVE Final   Staphylococcus aureus NEGATIVE NEGATIVE Final    Comment:        The Xpert SA Assay (FDA approved for NASAL specimens in patients over 50 years of age), is one component of a comprehensive surveillance program.  Test performance has been validated by [facility] for patients greater than or equal to 61 year old. It is not intended to diagnose infection nor to guide or monitor treatment.       Radiology Studies: Dg Chest 1 View  Result Date: 03/16/2017 CLINICAL DATA:  Preop for hip fracture. EXAM: CHEST 1 VIEW COMPARISON:  08/26/2015 FINDINGS: Borderline cardiomegaly. Chronic left sixth and seventh rib fractures with callus. No pneumonic consolidation, effusion or pneumothorax. IMPRESSION: No pneumonic consolidation.  Electronically Signed   By: Ashley Royalty M.D.   On: 03/16/2017 21:10   Dg Thoracic Spine 2 View  Result Date: 03/16/2017 CLINICAL DATA:  Pt fell on L hip after slipping on bandaged part of L foot while searching for remote. Reports L lateral hip pain and lower back pain. Pt also states she has neuropathy to lower left leg and is unsure if she broke anything. EXAM: THORACIC SPINE 2 VIEWS COMPARISON:  08/25/2016 FINDINGS: Body habitus and motion artifact reduced diagnostic sensitivity and specificity. Thoracic spondylosis. Upper thoracic spine not well depicted due to the body habitus and patient's shoulders. No obvious compression in the mid and lower thoracic spine. IMPRESSION: 1. No definite fracture or subluxation, upper thoracic spine not well seen on the lateral projection due to body habitus and motion artifact. 2. Mild thoracic spondylosis. Electronically Signed   By: Van Clines M.D.   On: 03/16/2017 19:11   Dg Lumbar Spine Complete  Result Date: 03/16/2017 CLINICAL DATA:  Pt fell on L hip after slipping on bandaged part of L foot while searching for remote. Reports L lateral hip pain and lower back pain. Pt also states she has neuropathy to lower left leg and is unsure if she broke anything. EXAM: LUMBAR SPINE - COMPLETE 4+ VIEW COMPARISON:  11/11/2012 CT abdomen FINDINGS: PLIF at W1-0 without complicating feature. Mild bony demineralization. The arcuate lines of the sacrum appear continuous. SI joints unremarkable. Clustered density in the right upper quadrant compatible with mulberry type gallstone. Bony detail is lost on the lateral projection due to body habitus, but there is no obvious vertebral collapse or malalignment. IMPRESSION: 1. No acute lumbar spine findings. 2. 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 3. Remote PLIF at L4-5. Electronically Signed   By: Van Clines M.D.   On: 03/16/2017 19:13   Dg Tibia/fibula Left  Result Date: 03/16/2017 CLINICAL DATA:  Pt  fell on L hip after slipping on bandaged part of L foot while searching for remote. Reports L lateral hip pain and lower back pain. Pt also states she has neuropathy to lower left leg and is unsure if she broke anything. EXAM: LEFT TIBIA AND FIBULA - 2 VIEW COMPARISON:  Knee radiographs from 08/22/2012 FINDINGS: Periosteal reaction and some deformity in the mid tibia and fibula, but with a chronic appearance possibly related to prior fractures and/ or hardware. Extensive bony hindfoot deformity, with presumed fracture or collapse of the talus, and fusion of the distal tibia and fibula with the calcaneus through 2 lag  screws. There is considerable lucency around both lag screws suggesting loosening or infection, and I can't exclude pseudoarticulation of the distal tibia and fibula with the irregular and partially collapsed calcaneus. Severe degeneration of the Chopart joint and poor visualization of the cuboid. Calcifications along the expected location of the Achilles tendon. IMPRESSION: 1. Extensive hindfoot deformity and collapse. Lucency around the hindfoot lag screws suggesting loosening or infection. 2. Chronic appearing periostitis in the mid mid shafts of the tibia and fibula extending to the ankle, with midshaft deformities possibly related to prior hardware. Electronically Signed   By: Van Clines M.D.   On: 03/16/2017 19:17   Dg Shoulder Left  Result Date: 03/16/2017 CLINICAL DATA:  Fall with left shoulder pain EXAM: LEFT SHOULDER - 2+ VIEW COMPARISON:  10/16/2012 FINDINGS: No acute displaced fracture or dislocation. Small calcifications adjacent to the humeral head suggesting calcific tendinitis. Mild AC joint degenerative change IMPRESSION: 1. No acute osseous abnormality 2. Degenerative change at the [facility] joint. Probable calcific tendinitis Electronically Signed   By: Donavan Foil M.D.   On: 03/16/2017 22:34   Dg Foot Complete Left  Result Date: 03/16/2017 CLINICAL DATA:  Pt fell on L hip  after slipping on bandaged part of L foot while searching for remote. Reports L lateral hip pain and lower back pain. Pt also states she has neuropathy to lower left leg and is unsure if she broke anything. EXAM: LEFT FOOT - COMPLETE 3+ VIEW COMPARISON:  None. FINDINGS: Hindfoot collapse with nonvisualization of the talus, collapse and partial fragmentation of the navicular, severe arthropathy at the Chopart joint, severe arthropathy in the dorsal midfoot, and attempted fusion of the flattened calcaneus with the distal tibia and fibula with lucency along the rib lag screws suggesting loosening or infection. Cough probable pseudarthrosis at the articulation between the tibia/ fibula and the flattened calcaneus. Ossific fragments along the expected Achilles tendon location. Bony demineralization. No malalignment at the Lisfranc joint. Transverse indistinct lucency in the distal phalanx great toe, age indeterminate fracture. IMPRESSION: 1. Complex collapse of the hindfoot and part of the midfoot. Probable loosening along the lag screws extending through the collapsed calcaneus into the tibia, and pseudarthrosis between the tibia/fibula and the collapsed calcaneus. 2. Age-indeterminate fracture of the distal phalanx great toe. 3. Bony demineralization. Electronically Signed   By: Van Clines M.D.   On: 03/16/2017 19:20   Dg Hip Unilat With Pelvis 2-3 Views Left  Result Date: 03/16/2017 CLINICAL DATA:  Pt fell on L hip after slipping on bandaged part of L foot while searching for remote. Reports L lateral hip pain and lower back pain. Pt also states she has neuropathy to lower left leg and is unsure if she broke anything. EXAM: DG HIP (WITH OR WITHOUT PELVIS) 2-3V LEFT COMPARISON:  None FINDINGS: PLIF at L4-5. Comminuted left intertrochanteric hip fracture, with separate lesser trochanteric fragment. Varus angulation at the fracture site. Bony demineralization in the pelvis. IMPRESSION: 1. Comminuted left hip  intertrochanteric fracture. Electronically Signed   By: Van Clines M.D.   On: 03/16/2017 19:10     Scheduled Meds: . acetaminophen  650 mg Oral Once  . buPROPion  300 mg Oral Daily  . DULoxetine  60 mg Oral Daily  . gabapentin  800 mg Oral TID  . insulin aspart  0-9 Units Subcutaneous Q4H  . insulin glargine  65 Units Subcutaneous QHS  . lactulose  30 g Oral TID  . loratadine  10 mg Oral Daily  .  polyethylene glycol  17 g Oral TID  . pramipexole  2 mg Oral QHS  . propranolol ER  60 mg Oral Daily  . rifaximin  550 mg Oral BID   Continuous Infusions:     Time spent: 35 minutes, more than 50% spent at bedside in 3 separate visits, coordinating care with the bedside nurse as well as the respiratory therapist, transferred to stepdown unit    Marzetta Board, MD, PhD [facility] Pager [phone]  If 7PM-7AM, please contact night-coverage [email or web address] Password [email or web address] 03/17/2017, 12:51 PM

## 2017-03-18 ENCOUNTER — Inpatient Hospital Stay (HOSPITAL_COMMUNITY): Payer: Medicare Other

## 2017-03-18 ENCOUNTER — Inpatient Hospital Stay (HOSPITAL_COMMUNITY): Payer: Medicare Other | Admitting: Certified Registered Nurse Anesthetist

## 2017-03-18 ENCOUNTER — Encounter (HOSPITAL_COMMUNITY)
Admission: EM | Disposition: A | Discharge status: Skilled Nursing Facility | Payer: Self-pay | Source: Home / Self Care | Attending: Internal Medicine

## 2017-03-18 DIAGNOSIS — S72142A Displaced intertrochanteric fracture of left femur, initial encounter for closed fracture: Secondary | ICD-10-CM | POA: Diagnosis present

## 2017-03-18 DIAGNOSIS — G8911 Acute pain due to trauma: Secondary | ICD-10-CM

## 2017-03-18 DIAGNOSIS — M25512 Pain in left shoulder: Secondary | ICD-10-CM

## 2017-03-18 HISTORY — PX: INTRAMEDULLARY (IM) NAIL INTERTROCHANTERIC: SHX5875

## 2017-03-18 LAB — CBC
HEMATOCRIT: 32 % — AB (ref 36.0–46.0)
Hemoglobin: 10 g/dL — ABNORMAL LOW (ref 12.0–15.0)
MCH: 30.9 pg (ref 26.0–34.0)
MCHC: 31.3 g/dL (ref 30.0–36.0)
MCV: 98.8 fL (ref 78.0–100.0)
PLATELETS: 110 10*3/uL — AB (ref 150–400)
RBC: 3.24 MIL/uL — ABNORMAL LOW (ref 3.87–5.11)
RDW: 15.3 % (ref 11.5–15.5)
WBC: 7.8 10*3/uL (ref 4.0–10.5)

## 2017-03-18 LAB — BLOOD GAS, ARTERIAL
Acid-Base Excess: 0.1 mmol/L (ref 0.0–2.0)
BICARBONATE: 24.3 mmol/L (ref 20.0–28.0)
Delivery systems: POSITIVE
Drawn by: 295031
EXPIRATORY PAP: 6
FIO2: 30
Inspiratory PAP: 12
Mode: POSITIVE
O2 Saturation: 97.5 %
PATIENT TEMPERATURE: 98.6
PO2 ART: 88.9 mmHg (ref 83.0–108.0)
pCO2 arterial: 40.1 mmHg (ref 32.0–48.0)
pH, Arterial: 7.4 (ref 7.350–7.450)

## 2017-03-18 LAB — GLUCOSE, CAPILLARY
GLUCOSE-CAPILLARY: 157 mg/dL — AB (ref 65–99)
GLUCOSE-CAPILLARY: 202 mg/dL — AB (ref 65–99)
GLUCOSE-CAPILLARY: 208 mg/dL — AB (ref 65–99)
GLUCOSE-CAPILLARY: 334 mg/dL — AB (ref 65–99)
Glucose-Capillary: 160 mg/dL — ABNORMAL HIGH (ref 65–99)
Glucose-Capillary: 293 mg/dL — ABNORMAL HIGH (ref 65–99)

## 2017-03-18 LAB — COMPREHENSIVE METABOLIC PANEL
ALBUMIN: 2.7 g/dL — AB (ref 3.5–5.0)
ALK PHOS: 144 U/L — AB (ref 38–126)
ALT: 27 U/L (ref 14–54)
AST: 44 U/L — AB (ref 15–41)
Anion gap: 7 (ref 5–15)
BUN: 18 mg/dL (ref 6–20)
CALCIUM: 8.3 mg/dL — AB (ref 8.9–10.3)
CO2: 25 mmol/L (ref 22–32)
CREATININE: 1.27 mg/dL — AB (ref 0.44–1.00)
Chloride: 105 mmol/L (ref 101–111)
GFR calc Af Amer: 50 mL/min — ABNORMAL LOW (ref >60)
GFR, EST NON AFRICAN AMERICAN: 43 mL/min — AB (ref >60)
GLUCOSE: 150 mg/dL — AB (ref 65–99)
Potassium: 5.7 mmol/L — ABNORMAL HIGH (ref 3.5–5.1)
Sodium: 137 mmol/L (ref 135–145)
Total Bilirubin: 1.6 mg/dL — ABNORMAL HIGH (ref 0.3–1.2)
Total Protein: 6.8 g/dL (ref 6.5–8.1)

## 2017-03-18 LAB — HEMOGLOBIN A1C
Hgb A1c MFr Bld: 6.6 % — ABNORMAL HIGH (ref 4.8–5.6)
Mean Plasma Glucose: 143 mg/dL

## 2017-03-18 LAB — PROTIME-INR
INR: 1.27
PROTHROMBIN TIME: 16 s — AB (ref 11.4–15.2)

## 2017-03-18 LAB — AMMONIA: Ammonia: 38 umol/L — ABNORMAL HIGH (ref 9–35)

## 2017-03-18 SURGERY — FIXATION, FRACTURE, INTERTROCHANTERIC, WITH INTRAMEDULLARY ROD
Anesthesia: General | Site: Hip | Laterality: Left

## 2017-03-18 MED ORDER — EPHEDRINE SULFATE 50 MG/ML IJ SOLN
INTRAMUSCULAR | Status: DC | PRN
  Administered 2017-03-18 (×2): 10 mg via INTRAVENOUS

## 2017-03-18 MED ORDER — SUGAMMADEX SODIUM 200 MG/2ML IV SOLN
INTRAVENOUS | Status: DC | PRN
  Administered 2017-03-18: 250 mg via INTRAVENOUS

## 2017-03-18 MED ORDER — SODIUM CHLORIDE 0.9 % IV SOLN
INTRAVENOUS | Status: DC | PRN
  Administered 2017-03-18: 08:00:00 via INTRAVENOUS

## 2017-03-18 MED ORDER — METOCLOPRAMIDE HCL 5 MG PO TABS: 5.0000 mg | ORAL_TABLET | Freq: Three times a day (TID) | ORAL | Status: DC | PRN

## 2017-03-18 MED ORDER — FENTANYL CITRATE (PF) 100 MCG/2ML IJ SOLN
INTRAMUSCULAR | Status: AC
  Filled 2017-03-18: qty 2

## 2017-03-18 MED ORDER — ISOPROPYL ALCOHOL 70 % SOLN
Status: AC
  Filled 2017-03-18: qty 480

## 2017-03-18 MED ORDER — PHENYLEPHRINE HCL 10 MG/ML IJ SOLN
INTRAMUSCULAR | Status: DC | PRN
  Administered 2017-03-18 (×5): 80 ug via INTRAVENOUS

## 2017-03-18 MED ORDER — MENTHOL 3 MG MT LOZG
1.0000 | LOZENGE | OROMUCOSAL | Status: DC | PRN
Start: 2017-03-18 — End: 2017-03-21
  Filled 2017-03-18: qty 9

## 2017-03-18 MED ORDER — ONDANSETRON HCL 4 MG/2ML IJ SOLN: 4.0000 mg | Freq: Four times a day (QID) | INTRAMUSCULAR | Status: DC | PRN

## 2017-03-18 MED ORDER — CEFAZOLIN SODIUM-DEXTROSE 2-4 GM/100ML-% IV SOLN
INTRAVENOUS | Status: AC
  Filled 2017-03-18: qty 100

## 2017-03-18 MED ORDER — ROCURONIUM BROMIDE 50 MG/5ML IV SOSY
PREFILLED_SYRINGE | INTRAVENOUS | Status: DC | PRN
  Administered 2017-03-18: 50 mg via INTRAVENOUS

## 2017-03-18 MED ORDER — MIDAZOLAM HCL 2 MG/2ML IJ SOLN
INTRAMUSCULAR | Status: AC
  Filled 2017-03-18: qty 2

## 2017-03-18 MED ORDER — ACETAMINOPHEN 325 MG PO TABS: 650.0000 mg | ORAL_TABLET | Freq: Four times a day (QID) | ORAL | Status: DC | PRN

## 2017-03-18 MED ORDER — KETAMINE HCL 10 MG/ML IJ SOLN
INTRAMUSCULAR | Status: AC
  Filled 2017-03-18: qty 1

## 2017-03-18 MED ORDER — PROPOFOL 10 MG/ML IV BOLUS
INTRAVENOUS | Status: AC
  Filled 2017-03-18: qty 20

## 2017-03-18 MED ORDER — DEXAMETHASONE SODIUM PHOSPHATE 10 MG/ML IJ SOLN
INTRAMUSCULAR | Status: DC | PRN
  Administered 2017-03-18: 10 mg via INTRAVENOUS

## 2017-03-18 MED ORDER — PHENOL 1.4 % MT LIQD
1.0000 | OROMUCOSAL | Status: DC | PRN
  Filled 2017-03-18: qty 177

## 2017-03-18 MED ORDER — METOCLOPRAMIDE HCL 5 MG/ML IJ SOLN: 5.0000 mg | Freq: Three times a day (TID) | INTRAMUSCULAR | Status: DC | PRN

## 2017-03-18 MED ORDER — PHENYLEPHRINE HCL 10 MG/ML IJ SOLN
INTRAVENOUS | Status: DC | PRN
  Administered 2017-03-18: 50 ug/min via INTRAVENOUS

## 2017-03-18 MED ORDER — GABAPENTIN 300 MG PO CAPS
ORAL_CAPSULE | ORAL | Status: AC
  Filled 2017-03-18: qty 1

## 2017-03-18 MED ORDER — LACTULOSE ENEMA
300.0000 mL | Freq: Once | ORAL | Status: AC
  Administered 2017-03-18: 300 mL via RECTAL
  Filled 2017-03-18: qty 300

## 2017-03-18 MED ORDER — LIDOCAINE 2% (20 MG/ML) 5 ML SYRINGE
INTRAMUSCULAR | Status: AC
  Filled 2017-03-18: qty 5

## 2017-03-18 MED ORDER — ACETAMINOPHEN 325 MG PO TABS
ORAL_TABLET | ORAL | Status: AC
  Filled 2017-03-18: qty 2

## 2017-03-18 MED ORDER — FENTANYL CITRATE (PF) 100 MCG/2ML IJ SOLN: 25.0000 ug | INTRAMUSCULAR | Status: DC | PRN

## 2017-03-18 MED ORDER — PROPOFOL 10 MG/ML IV BOLUS
INTRAVENOUS | Status: DC | PRN
  Administered 2017-03-18: 100 mg via INTRAVENOUS

## 2017-03-18 MED ORDER — SODIUM CHLORIDE 0.9 % IV SOLN
1.0000 g | Freq: Once | INTRAVENOUS | Status: AC
  Administered 2017-03-18: 1 g via INTRAVENOUS
  Filled 2017-03-18: qty 10

## 2017-03-18 MED ORDER — ACETAMINOPHEN 650 MG RE SUPP: 650.0000 mg | Freq: Four times a day (QID) | RECTAL | Status: DC | PRN

## 2017-03-18 MED ORDER — ACETAMINOPHEN 500 MG PO TABS
ORAL_TABLET | ORAL | Status: AC
  Filled 2017-03-18: qty 2

## 2017-03-18 MED ORDER — METHOCARBAMOL 1000 MG/10ML IJ SOLN
500.0000 mg | Freq: Once | INTRAVENOUS | Status: AC
  Administered 2017-03-18: 500 mg via INTRAVENOUS
  Filled 2017-03-18: qty 550

## 2017-03-18 MED ORDER — FENTANYL CITRATE (PF) 100 MCG/2ML IJ SOLN
INTRAMUSCULAR | Status: DC | PRN
  Administered 2017-03-18: 50 ug via INTRAVENOUS

## 2017-03-18 MED ORDER — ONDANSETRON HCL 4 MG/2ML IJ SOLN
INTRAMUSCULAR | Status: AC
  Filled 2017-03-18: qty 2

## 2017-03-18 MED ORDER — SODIUM POLYSTYRENE SULFONATE 15 GM/60ML PO SUSP
15.0000 g | Freq: Once | ORAL | Status: AC
  Administered 2017-03-18: 15 g via ORAL
  Filled 2017-03-18: qty 60

## 2017-03-18 MED ORDER — ONDANSETRON HCL 4 MG PO TABS: 4.0000 mg | ORAL_TABLET | Freq: Four times a day (QID) | ORAL | Status: DC | PRN

## 2017-03-18 MED ORDER — ENOXAPARIN SODIUM 40 MG/0.4ML ~~LOC~~ SOLN
40.0000 mg | SUBCUTANEOUS | Status: DC
  Administered 2017-03-19 – 2017-03-21 (×3): 40 mg via SUBCUTANEOUS
  Filled 2017-03-18 (×3): qty 0.4

## 2017-03-18 MED ORDER — CEFAZOLIN SODIUM-DEXTROSE 2-4 GM/100ML-% IV SOLN
2.0000 g | Freq: Four times a day (QID) | INTRAVENOUS | Status: AC
  Administered 2017-03-18 (×2): 2 g via INTRAVENOUS
  Filled 2017-03-18 (×3): qty 100

## 2017-03-18 MED ORDER — ISOPROPYL ALCOHOL 70 % SOLN
Status: DC | PRN
Start: 2017-03-18 — End: 2017-03-18
  Administered 2017-03-18: 1 via TOPICAL

## 2017-03-18 MED ORDER — DEXAMETHASONE SODIUM PHOSPHATE 10 MG/ML IJ SOLN
INTRAMUSCULAR | Status: AC
  Filled 2017-03-18: qty 1

## 2017-03-18 MED ORDER — SODIUM CHLORIDE 0.9 % IR SOLN
Status: DC | PRN
  Administered 2017-03-18: 1000 mL

## 2017-03-18 SURGICAL SUPPLY — 35 items
BAG ZIPLOCK 12X15 (MISCELLANEOUS) ×3 IMPLANT
BIT DRILL 4.3MMS DISTAL GRDTED (BIT) ×2 IMPLANT
CHLORAPREP W/TINT 26ML (MISCELLANEOUS) ×3 IMPLANT
COVER PERINEAL POST (MISCELLANEOUS) ×3 IMPLANT
COVER SURGICAL LIGHT HANDLE (MISCELLANEOUS) ×3 IMPLANT
DERMABOND ADVANCED (GAUZE/BANDAGES/DRESSINGS) ×4
DERMABOND ADVANCED .7 DNX12 (GAUZE/BANDAGES/DRESSINGS) ×2 IMPLANT
DRAPE C-ARM 42X120 X-RAY (DRAPES) ×3 IMPLANT
DRAPE C-ARMOR (DRAPES) ×3 IMPLANT
DRAPE SHEET LG 3/4 BI-LAMINATE (DRAPES) ×3 IMPLANT
DRAPE STERI IOBAN 125X83 (DRAPES) ×3 IMPLANT
DRAPE U-SHAPE 47X51 STRL (DRAPES) ×6 IMPLANT
DRILL 4.3MMS DISTAL GRADUATED (BIT) ×6
DRSG MEPILEX BORDER 4X4 (GAUZE/BANDAGES/DRESSINGS) ×9 IMPLANT
DRSG MEPILEX BORDER 4X8 (GAUZE/BANDAGES/DRESSINGS) IMPLANT
ELECT BLADE TIP CTD 4 INCH (ELECTRODE) ×3 IMPLANT
FACESHIELD WRAPAROUND (MASK) ×3 IMPLANT
GAUZE SPONGE 4X4 12PLY STRL (GAUZE/BANDAGES/DRESSINGS) ×3 IMPLANT
GLOVE BIO SURGEON STRL SZ8.5 (GLOVE) ×6 IMPLANT
GLOVE BIOGEL PI IND STRL 8.5 (GLOVE) ×1 IMPLANT
GLOVE BIOGEL PI INDICATOR 8.5 (GLOVE) ×2
GOWN SPEC L3 XXLG W/TWL (GOWN DISPOSABLE) ×3 IMPLANT
GUIDEPIN 3.2X17.5 THRD DISP (PIN) ×3 IMPLANT
GUIDEWIRE BALL NOSE 100CM (WIRE) ×3 IMPLANT
HFN LAG SCREW 10.5MM X 115MM (Orthopedic Implant) ×3 IMPLANT
HFN LH 130 DEG 11MM X 380MM (Orthopedic Implant) ×3 IMPLANT
KIT BASIN OR (CUSTOM PROCEDURE TRAY) ×3 IMPLANT
MANIFOLD NEPTUNE II (INSTRUMENTS) ×3 IMPLANT
MARKER SKIN DUAL TIP RULER LAB (MISCELLANEOUS) ×3 IMPLANT
PACK TOTAL JOINT WL LF (CUSTOM PROCEDURE TRAY) ×3 IMPLANT
SCREW BONE CORTICAL 5.0X42 (Screw) ×3 IMPLANT
SCREWDRIVER HEX TIP 3.5MM (MISCELLANEOUS) ×3 IMPLANT
SUT MNCRL AB 3-0 PS2 18 (SUTURE) ×3 IMPLANT
SUT VIC AB 1 CT1 36 (SUTURE) ×3 IMPLANT
YANKAUER SUCT BULB TIP NO VENT (SUCTIONS) ×3 IMPLANT

## 2017-03-18 NOTE — Interval H&P Note (Signed)
History and Physical Interval Note:  03/18/2017 7:39 AM  Daisy Larson  has presented today for surgery, with the diagnosis of left hip fracture  The various methods of treatment have been discussed with the patient and family. After consideration of risks, benefits and other options for treatment, the patient has consented to  Procedure(s): INTRAMEDULLARY (IM) NAIL INTERTROCHANTRIC (Left) as a surgical intervention .  The patient's history has been reviewed, patient examined, no change in status, stable for surgery.  I have reviewed the patient's chart and labs.  Questions were answered to the patient's satisfaction.    The risks, benefits, and alternatives were discussed with the patient. There are risks associated with the surgery including, but not limited to, problems with anesthesia (death), infection, differences in leg length/angulation/rotation, fracture of bones, loosening or failure of implants, malunion, nonunion, hematoma (blood accumulation) which may require surgical drainage, blood clots, pulmonary embolism, nerve injury (foot drop), and blood vessel injury. The patient understands these risks and elects to proceed.    Ricci Dirocco, Horald Pollen

## 2017-03-18 NOTE — Discharge Instructions (Signed)
°Dr. Shamyra Farias °Adult Hip & Knee Specialist °[facility] °[address] °[place], [place] [address] °[phone] ° ° °POSTOPERATIVE DIRECTIONS ° ° ° °Hip Rehabilitation, Guidelines Following Surgery  ° °WEIGHT BEARING °Partial weight bearing with assist device as directed.  touch down weight bearing ° ° °HOME CARE INSTRUCTIONS  °Remove items at home which could result in a fall. This includes throw rugs or furniture in walking pathways.  °Continue medications as instructed at time of discharge. °· You may have some home medications which will be placed on hold until you complete the course of blood thinner medication. °· 4 days after discharge, you may start showering. No tub baths or soaking your incisions. °Do not put on socks or shoes without following the instructions of your caregivers.   °Sit on chairs with arms. Use the chair arms to help push yourself up when arising.  °Arrange for the use of a toilet seat elevator so you are not sitting low.  °· Walk with walker as instructed.  °You may resume a sexual relationship in one month or when given the OK by your caregiver.  °Use walker as long as suggested by your caregivers.  °Avoid periods of inactivity such as sitting longer than an hour when not asleep. This helps prevent blood clots.  °You may return to work once you are cleared by your surgeon.  °Do not drive a car for 6 weeks or until released by your surgeon.  °Do not drive while taking narcotics.  °Wear elastic stockings for two weeks following surgery during the day but you may remove then at night.  °Make sure you keep all of your appointments after your operation with all of your doctors and caregivers. You should call the office at the above phone number and make an appointment for approximately two weeks after the date of your surgery. °Please pick up a stool softener and laxative for home use as long as you are requiring pain medications. °· ICE to the affected hip  every three hours for 30 minutes at a time and then as needed for pain and swelling. Continue to use ice on the hip for pain and swelling from surgery. You may notice swelling that will progress down to the foot and ankle.  This is normal after surgery.  Elevate the leg when you are not up walking on it.   °It is important for you to complete the blood thinner medication as prescribed by your doctor. °· Continue to use the breathing machine which will help keep your temperature down.  It is common for your temperature to cycle up and down following surgery, especially at night when you are not up moving around and exerting yourself.  The breathing machine keeps your lungs expanded and your temperature down. ° °RANGE OF MOTION AND STRENGTHENING EXERCISES  °These exercises are designed to help you keep full movement of your hip joint. Follow your caregiver's or physical therapist's instructions. Perform all exercises about fifteen times, three times per day or as directed. Exercise both hips, even if you have had only one joint replacement. These exercises can be done on a training (exercise) mat, on the floor, on a table or on a bed. Use whatever works the best and is most comfortable for you. Use music or television while you are exercising so that the exercises are a pleasant break in your day. This will make your life better with the exercises acting as a break in routine you can   look forward to.  °Lying on your back, slowly slide your foot toward your buttocks, raising your knee up off the floor. Then slowly slide your foot back down until your leg is straight again.  °Lying on your back spread your legs as far apart as you can without causing discomfort.  °Lying on your side, raise your upper leg and foot straight up from the floor as far as is comfortable. Slowly lower the leg and repeat.  °Lying on your back, tighten up the muscle in the front of your thigh (quadriceps muscles). You can do this by keeping your  leg straight and trying to raise your heel off the floor. This helps strengthen the largest muscle supporting your knee.  °Lying on your back, tighten up the muscles of your buttocks both with the legs straight and with the knee bent at a comfortable angle while keeping your heel on the floor.  ° °SKILLED REHAB INSTRUCTIONS: °If the patient is transferred to a skilled rehab facility following release from the hospital, a list of the current medications will be sent to the facility for the patient to continue.  When discharged from the skilled rehab facility, please have the facility set up the patient's Home Health Physical Therapy prior to being released. Also, the skilled facility will be responsible for providing the patient with their medications at time of release from the facility to include their pain medication and their blood thinner medication. If the patient is still at the rehab facility at time of the two week follow up appointment, the skilled rehab facility will also need to assist the patient in arranging follow up appointment in our office and any transportation needs. ° °MAKE SURE YOU:  °Understand these instructions.  °Will watch your condition.  °Will get help right away if you are not doing well or get worse. ° °Pick up stool softner and laxative for home use following surgery while on pain medications. °Daily dry dressing changes as needed. °In 4 days, you may remove your dressings and begin taking showers - no tub baths or soaking the incisions. °Continue to use ice for pain and swelling after surgery. °Do not use any lotions or creams on the incision until instructed by your surgeon. ° ° °

## 2017-03-18 NOTE — Anesthesia Procedure Notes (Signed)
Procedure Name: Intubation Date/Time: 03/18/2017 8:36 AM Performed by: West Pugh Pre-anesthesia Checklist: Patient identified, Emergency Drugs available, Suction available, Patient being monitored and Timeout performed Patient Re-evaluated:Patient Re-evaluated prior to inductionOxygen Delivery Method: Circle system utilized Preoxygenation: Pre-oxygenation with 100% oxygen Intubation Type: IV induction Ventilation: Mask ventilation without difficulty Laryngoscope Size: Glidescope and 4 Grade View: Grade I Tube type: Subglottic suction tube Tube size: 7.5 mm Number of attempts: 1 Airway Equipment and Method: Stylet Placement Confirmation: ETT inserted through vocal cords under direct vision,  positive ETCO2,  CO2 detector and breath sounds checked- equal and bilateral Secured at: 23 cm Tube secured with: Tape Dental Injury: Teeth and Oropharynx as per pre-operative assessment  Difficulty Due To: Difficulty was anticipated

## 2017-03-18 NOTE — Transfer of Care (Signed)
Immediate Anesthesia Transfer of Care Note  Patient: Daisy Larson  Procedure(s) Performed: Procedure(s): INTRAMEDULLARY (IM) NAIL INTERTROCHANTRIC (Left)  Patient Location: PACU  Anesthesia Type:General  Level of Consciousness:  sedated, patient cooperative and responds to stimulation  Airway & Oxygen Therapy:Patient Spontanous Breathing and Patient connected to face mask oxgen  Post-op Assessment:  Report given to PACU RN and Post -op Vital signs reviewed and stable  Post vital signs:  Reviewed and stable  Last Vitals:  Vitals:   03/18/17 0700 03/18/17 1018  BP: (!) 123/45 (!) 118/50  Pulse: 71 76  Resp: 15   Temp:      Complications: No apparent anesthesia complications

## 2017-03-18 NOTE — Op Note (Signed)
OPERATIVE REPORT  SURGEON: Rod Can, MD   ASSISTANT: Staff.  PREOPERATIVE DIAGNOSIS: Comminuted Left intertrochanteric femur fracture.   POSTOPERATIVE DIAGNOSIS: Comminuted Left intertrochanteric femur fracture.   PROCEDURE: Intramedullary fixation, Left femur.   IMPLANTS: Biomet Affixus hip fracture nail, 11 by 380 mm, 130. 10.5 x 115 mm lag screw. 5 x 42 mm distal interlocking screw 1.  ANESTHESIA:  General  ESTIMATED BLOOD LOSS: 200 mL.  ANTIBIOTICS: 2 g Ancef.  DRAINS: None.  COMPLICATIONS: None.   CONDITION: PACU - hemodynamically stable.  BRIEF CLINICAL NOTE: Daisy Larson is a 66 y.o. female who presented with a comminuted intertrochanteric femur fracture. The patient was admitted to the hospitalist service and underwent perioperative risk stratification and medical optimization. The risks, benefits, and alternatives to the procedure were explained, and the patient elected to proceed. 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  PROCEDURE IN DETAIL: Surgical site was marked by myself. The patient was taken to the operating room and anesthesia was induced on the bed. The patient was then transferred to the [facility] table and the nonoperative lower extremity was scissored underneath the operative side. The fracture was reduced with traction, internal rotation, and adduction. The hip was prepped and draped in the normal sterile surgical fashion. Timeout was called verifying side and site of surgery. Preop antibiotics were given with 60 minutes of beginning the procedure.  Fluoroscopy was used to define the patient's anatomy. A 4 cm incision was made just proximal to the tip of the greater trochanter. The awl was used to obtain the standard starting point for a trochanteric entry nail under fluoroscopic control. The guidepin was placed. The entry reamer was  used to open the proximal femur.  I placed the guidewire to the level of the physeal scar of the knee. I measured the length of the guidewire. Sequential reaming was performed up to a size 12.5 mm with excellent chatter. Therefore, a size 11 by 380 mm nail was selected and assembled to the jig on the back table. The nail was placed without any difficulty. Through a separate stab incision, the cannula was placed down to the bone in preparation for the cephalomedullary device. A guidepin was placed into the femoral head using AP and lateral fluoroscopy views. The pin was measured, and then reaming was performed to the appropriate depth. The lag screw was inserted to the appropriate depth. The fracture was compressed through the jig. The setscrew was tightened and then loosened one quarter turn. Using perfect circle technique, a distal interlocking screw was placed. The jig was removed. Final AP and lateral fluoroscopy views were obtained to confirm fracture reduction and hardware placement. Tip apex distance was appropriate. There was no chondral penetration.  The wounds were copiously irrigated with saline. The wound was closed in layers with #1 Vicryl for the fascia, 2-0 Monocryl for the deep dermal layer, and 3-0 Monocryl subcuticular stitch. Glue was applied to the skin. Once the glue was fully hardened, sterile dressing was applied. The patient was then awakened from anesthesia and taken to the PACU in stable condition. Sponge needle and instrument counts were correct at the end of the case 2. There were no known complications.  We will readmit the patient to the hospitalist. Weightbearing status will be touchdown weightbearing with a walker. We will begin Lovenox for DVT prophylaxis. The patient will work with physical therapy  and undergo disposition planning.

## 2017-03-18 NOTE — Progress Notes (Signed)
Pt unable to tell this RN current year and location where she is at.  Increased confusion noted from family members in room as well.  Informed Dr. Cruzita Lederer.  Bipap ordered as well as ABG. Irven Baltimore, RN

## 2017-03-18 NOTE — Progress Notes (Signed)
Pt. currently on BiPAP V-60, has an order for CPAP and uses CPAP at home, tolerating current settings well with no c/o, husband at bedside, pt. is receiving pain meds for broken hip which RT more comfortable with V-60 and back up rate, CPAP placed in room if BiPAP V-60 needed for another pt., RT to monitor.

## 2017-03-18 NOTE — Consult Note (Signed)
[place] Nurse wound consult note Reason for Consult: requires orders for wound care for chronic wound.  Patient is followed by her surgeons in [facility] for these wounds. Wound type:Healing, surgical (full thickness) Pressure Injury POA: No Measurement: left medial foot, distal measures 1cm x 1.4cm x 0.2cm and proximal measures 0.6cm round x 0.2cm. Wound bed:red, moist Drainage (amount, consistency, odor) scant serous Periwound:intact with evidence of previous wound jhealing Dressing procedure/placement/frequency: Will continue orders of MD in [place]:  Cleaning daily and then painting with betadine swabstick, dressing with a clean, dry gauze 2x2 and securing with paper tape.  I have added the floatation of heels. [place] nursing team will not follow, but will remain available to this patient, the nursing and medical teams.  Please re-consult if needed. Thanks, Maudie Flakes, MSN, RN, [place], Arther Abbott  Pager# [phone]

## 2017-03-18 NOTE — Progress Notes (Signed)
PROGRESS NOTE  Daisy Larson KCM:[id number] DOB: [birth year] DOA: 03/16/2017 PCP: [name], MD   LOS: 2 days   Brief Narrative: 66 year old female with history of invasive carcinoma, Charcot foot and diabetic arthropathy status post several surgical interventions, complicated by intra-articular infection status post intra-articular bead placement about 3 weeks ago, currently on Bactrim.  She also has a history of nonalcoholic cirrhosis for which she is followed by hepatology at [facility].  She comes to the hospital status post mechanical fall with a hip fracture.  Assessment & Plan: Active Problems:   DM2 (diabetes mellitus, type 2) (HCC)   Type 2 diabetes, uncontrolled, with neuropathy (HCC)   RESTLESS LEGS SYNDROME   Diabetic polyneuropathy (HCC)   Malignant neoplasm of upper-outer quadrant of right breast in female, estrogen receptor positive (HCC)   Anemia   OSA (obstructive sleep apnea)   Thrombocytopenia, unspecified (HCC)   Essential hypertension   Hyperkalemia   Charcot ankle, left   Other cirrhosis of liver (HCC)   Hip fracture (HCC)   Prolonged QT interval   Hip fracture -Orthopedic surgery consulted, appreciate input.  -looks better this morning, mental status is close to baseline, she is at moderate risk, d/w family, but no further interventions needed, OK for surgery from medicine standpoint  Liver cirrhosis -Nonalcoholic, now with very mild hepatic encephalopathy -Ammonia level borderline elevated, continue home lactulose as well as rifaximin once postop  Respiratory acidosis with acute hypercarbic respiratory failure -s/p BiPAP, ABG last night reassuring, clinically close to baseline this morning  Hyperkalemia -lagging due to Bactrim/AKI/Spironolactone -mild, will give IV calcium for now, Kayexalate post op  Obstructive sleep apnea -Ensure that CPAP is on tonight  Acute encephalopathy -Likely multifactorial, component of hepatic  encephalopathy as well as hypercarbic respiratory failure -improved  Thrombocytopenia -Likely due to liver cirrhosis currently platelets above 100K  Left foot wounds -This is postsurgery, wound care consulted, continue Betadine as per family recommendations as they have been doing at home -continue clindamycin   Acute kidney injury -?  In the setting of Bactrim, improving -Hold Bactrim for now -Cr improving, 1.27 this morning  Restless leg syndrome -Continue home medications  Diabetes mellitus and diabetic neuropathy -Continue sliding scale, continue Neurontin    DVT prophylaxis: SCDs Code Status: Full code Family Communication: Discussed extensively with patient's daughter and husband at bedside Disposition Plan: remain in SDU post op  Consultants:   Orthopedic surgery  Procedures:   BiPAP: yes  Antimicrobials:  Bactrim before admission >> 5/19  Clindamycin 5/19>>  Subjective: -alert to place, situation, name, got her birth date 1 day off. No chest pain/dyspnea  Objective: Vitals:   03/18/17 0300 03/18/17 0400 03/18/17 0500 03/18/17 0506  BP:      Pulse: 71 73 70   Resp: 19 20 17    Temp:    97.1 F (36.2 C)  TempSrc:    Axillary  SpO2: 97% 97% 99%   Weight:      Height:        Intake/Output Summary (Last 24 hours) at 03/18/17 0700 Last data filed at 03/18/17 0500  Gross per 24 hour  Intake              100 ml  Output             1150 ml  Net            -1050 ml   Filed Weights   03/16/17 1725 03/16/17 1727 03/16/17 2306  Weight: 111.1  kg (245 lb) 111.1 kg (245 lb) 111.1 kg (245 lb)    Examination:  Vitals:   03/18/17 0300 03/18/17 0400 03/18/17 0500 03/18/17 0506  BP:      Pulse: 71 73 70   Resp: 19 20 17    Temp:    97.1 F (36.2 C)  TempSrc:    Axillary  SpO2: 97% 97% 99%   Weight:      Height:       Constitutional: NAD, calm, comfortable, BiPAP on, talkative Eyes: lids and conjunctivae normal Respiratory: clear to auscultation  bilaterally, no wheezing, no crackles. Normal respiratory effort.  Cardiovascular: Regular rate and rhythm, no murmurs / rubs / gallops. No LE edema. 2+ pedal pulses.  Abdomen: no tenderness. Bowel sounds positive.  Skin: no rashes, lesions, ulcers. No induration Neurologic: non focal   Data Reviewed: I have personally reviewed following labs and imaging studies  CBC:  Recent Labs Lab 03/16/17 1903 03/17/17 0649 03/18/17 0345  WBC 5.0 8.3 7.8  NEUTROABS 3.5  --   --   HGB 8.7* 10.1* 10.0*  HCT 26.8* 31.6* 32.0*  MCV 97.8 98.4 98.8  PLT 112* 125* 248*   Basic Metabolic Panel:  Recent Labs Lab 03/16/17 1903 03/17/17 0649 03/18/17 0345  NA 140 137 137  K 5.2* 5.4* 5.7*  CL 106 103 105  CO2 26 27 25   GLUCOSE 235* 140* 150*  BUN 19 17 18   CREATININE 1.63* 1.35* 1.27*  CALCIUM 8.9 8.5* 8.3*   GFR: Estimated Creatinine Clearance: 57 mL/min (A) (by C-G formula based on SCr of 1.27 mg/dL (H)). Liver Function Tests:  Recent Labs Lab 03/16/17 1903 03/17/17 0649 03/18/17 0345  AST 52*  --  44*  ALT 28  --  27  ALKPHOS 162*  --  144*  BILITOT 0.6  --  1.6*  PROT 6.6  --  6.8  ALBUMIN 2.6* 2.7* 2.7*   No results for input(s): LIPASE, AMYLASE in the last 168 hours.  Recent Labs Lab 03/17/17 0920  AMMONIA 49*   Coagulation Profile:  Recent Labs Lab 03/16/17 1903 03/18/17 0345  INR 1.11 1.27   Cardiac Enzymes: No results for input(s): CKTOTAL, CKMB, CKMBINDEX, TROPONINI in the last 168 hours. BNP (last 3 results) No results for input(s): PROBNP in the last 8760 hours. HbA1C: No results for input(s): HGBA1C in the last 72 hours. CBG:  Recent Labs Lab 03/17/17 1200 03/17/17 1504 03/17/17 2047 03/17/17 2339 03/18/17 0422  GLUCAP 154* 145* 147* 156* 157*   Lipid Profile: No results for input(s): CHOL, HDL, LDLCALC, TRIG, CHOLHDL, LDLDIRECT in the last 72 hours. Thyroid Function Tests: No results for input(s): TSH, T4TOTAL, FREET4, T3FREE, THYROIDAB  in the last 72 hours. Anemia Panel: No results for input(s): VITAMINB12, FOLATE, FERRITIN, TIBC, IRON, RETICCTPCT in the last 72 hours. Urine analysis:    Component Value Date/Time   COLORURINE YELLOW 03/16/2017 2348   APPEARANCEUR CLEAR 03/16/2017 2348   LABSPEC 1.011 03/16/2017 2348   PHURINE 6.0 03/16/2017 2348   GLUCOSEU NEGATIVE 03/16/2017 2348   GLUCOSEU NEGATIVE 09/14/2015 0808   HGBUR NEGATIVE 03/16/2017 2348   BILIRUBINUR NEGATIVE 03/16/2017 2348   KETONESUR NEGATIVE 03/16/2017 2348   PROTEINUR NEGATIVE 03/16/2017 2348   UROBILINOGEN 1.0 09/14/2015 0808   NITRITE NEGATIVE 03/16/2017 2348   LEUKOCYTESUR NEGATIVE 03/16/2017 2348   Sepsis Labs: Invalid input(s): PROCALCITONIN, LACTICIDVEN  Recent Results (from the past 240 hour(s))  Surgical pcr screen     Status: None   Collection Time: 03/17/17  10:18 AM  Result Value Ref Range Status   MRSA, PCR NEGATIVE NEGATIVE Final   Staphylococcus aureus NEGATIVE NEGATIVE Final    Comment:        The Xpert SA Assay (FDA approved for NASAL specimens in patients over 68 years of age), is one component of a comprehensive surveillance program.  Test performance has been validated by [facility] for patients greater than or equal to 85 year old. It is not intended to diagnose infection nor to guide or monitor treatment.       Radiology Studies: Dg Chest 1 View  Result Date: 03/16/2017 CLINICAL DATA:  Preop for hip fracture. EXAM: CHEST 1 VIEW COMPARISON:  08/26/2015 FINDINGS: Borderline cardiomegaly. Chronic left sixth and seventh rib fractures with callus. No pneumonic consolidation, effusion or pneumothorax. IMPRESSION: No pneumonic consolidation. Electronically Signed   By: Ashley Royalty M.D.   On: 03/16/2017 21:10   Dg Thoracic Spine 2 View  Result Date: 03/16/2017 CLINICAL DATA:  Pt fell on L hip after slipping on bandaged part of L foot while searching for remote. Reports L lateral hip pain and lower back pain. Pt also  states she has neuropathy to lower left leg and is unsure if she broke anything. EXAM: THORACIC SPINE 2 VIEWS COMPARISON:  08/25/2016 FINDINGS: Body habitus and motion artifact reduced diagnostic sensitivity and specificity. Thoracic spondylosis. Upper thoracic spine not well depicted due to the body habitus and patient's shoulders. No obvious compression in the mid and lower thoracic spine. IMPRESSION: 1. No definite fracture or subluxation, upper thoracic spine not well seen on the lateral projection due to body habitus and motion artifact. 2. Mild thoracic spondylosis. Electronically Signed   By: Van Clines M.D.   On: 03/16/2017 19:11   Dg Lumbar Spine Complete  Result Date: 03/16/2017 CLINICAL DATA:  Pt fell on L hip after slipping on bandaged part of L foot while searching for remote. Reports L lateral hip pain and lower back pain. Pt also states she has neuropathy to lower left leg and is unsure if she broke anything. EXAM: LUMBAR SPINE - COMPLETE 4+ VIEW COMPARISON:  11/11/2012 CT abdomen FINDINGS: PLIF at L8-7 without complicating feature. Mild bony demineralization. The arcuate lines of the sacrum appear continuous. SI joints unremarkable. Clustered density in the right upper quadrant compatible with mulberry type gallstone. Bony detail is lost on the lateral projection due to body habitus, but there is no obvious vertebral collapse or malalignment. IMPRESSION: 1. No acute lumbar spine findings. 2. 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 3. Remote PLIF at L4-5. Electronically Signed   By: Van Clines M.D.   On: 03/16/2017 19:13   Dg Tibia/fibula Left  Result Date: 03/16/2017 CLINICAL DATA:  Pt fell on L hip after slipping on bandaged part of L foot while searching for remote. Reports L lateral hip pain and lower back pain. Pt also states she has neuropathy to lower left leg and is unsure if she broke anything. EXAM: LEFT TIBIA AND FIBULA - 2 VIEW COMPARISON:  Knee  radiographs from 08/22/2012 FINDINGS: Periosteal reaction and some deformity in the mid tibia and fibula, but with a chronic appearance possibly related to prior fractures and/ or hardware. Extensive bony hindfoot deformity, with presumed fracture or collapse of the talus, and fusion of the distal tibia and fibula with the calcaneus through 2 lag screws. There is considerable lucency around both lag screws suggesting loosening or infection, and I can't exclude pseudoarticulation of the distal tibia  and fibula with the irregular and partially collapsed calcaneus. Severe degeneration of the Chopart joint and poor visualization of the cuboid. Calcifications along the expected location of the Achilles tendon. IMPRESSION: 1. Extensive hindfoot deformity and collapse. Lucency around the hindfoot lag screws suggesting loosening or infection. 2. Chronic appearing periostitis in the mid mid shafts of the tibia and fibula extending to the ankle, with midshaft deformities possibly related to prior hardware. Electronically Signed   By: Van Clines M.D.   On: 03/16/2017 19:17   Dg Shoulder Left  Result Date: 03/16/2017 CLINICAL DATA:  Fall with left shoulder pain EXAM: LEFT SHOULDER - 2+ VIEW COMPARISON:  10/16/2012 FINDINGS: No acute displaced fracture or dislocation. Small calcifications adjacent to the humeral head suggesting calcific tendinitis. Mild AC joint degenerative change IMPRESSION: 1. No acute osseous abnormality 2. Degenerative change at the [facility] joint. Probable calcific tendinitis Electronically Signed   By: Donavan Foil M.D.   On: 03/16/2017 22:34   Dg Foot Complete Left  Result Date: 03/16/2017 CLINICAL DATA:  Pt fell on L hip after slipping on bandaged part of L foot while searching for remote. Reports L lateral hip pain and lower back pain. Pt also states she has neuropathy to lower left leg and is unsure if she broke anything. EXAM: LEFT FOOT - COMPLETE 3+ VIEW COMPARISON:  None. FINDINGS:  Hindfoot collapse with nonvisualization of the talus, collapse and partial fragmentation of the navicular, severe arthropathy at the Chopart joint, severe arthropathy in the dorsal midfoot, and attempted fusion of the flattened calcaneus with the distal tibia and fibula with lucency along the rib lag screws suggesting loosening or infection. Cough probable pseudarthrosis at the articulation between the tibia/ fibula and the flattened calcaneus. Ossific fragments along the expected Achilles tendon location. Bony demineralization. No malalignment at the Lisfranc joint. Transverse indistinct lucency in the distal phalanx great toe, age indeterminate fracture. IMPRESSION: 1. Complex collapse of the hindfoot and part of the midfoot. Probable loosening along the lag screws extending through the collapsed calcaneus into the tibia, and pseudarthrosis between the tibia/fibula and the collapsed calcaneus. 2. Age-indeterminate fracture of the distal phalanx great toe. 3. Bony demineralization. Electronically Signed   By: Van Clines M.D.   On: 03/16/2017 19:20   Dg Hip Unilat With Pelvis 2-3 Views Left  Result Date: 03/16/2017 CLINICAL DATA:  Pt fell on L hip after slipping on bandaged part of L foot while searching for remote. Reports L lateral hip pain and lower back pain. Pt also states she has neuropathy to lower left leg and is unsure if she broke anything. EXAM: DG HIP (WITH OR WITHOUT PELVIS) 2-3V LEFT COMPARISON:  None FINDINGS: PLIF at L4-5. Comminuted left intertrochanteric hip fracture, with separate lesser trochanteric fragment. Varus angulation at the fracture site. Bony demineralization in the pelvis. IMPRESSION: 1. Comminuted left hip intertrochanteric fracture. Electronically Signed   By: Van Clines M.D.   On: 03/16/2017 19:10     Scheduled Meds: . acetaminophen  650 mg Oral Once  . buPROPion  300 mg Oral Daily  . chlorhexidine  4 application Topical Once  . DULoxetine  60 mg Oral  Daily  . gabapentin  800 mg Oral TID  . insulin aspart  0-9 Units Subcutaneous Q4H  . insulin glargine  65 Units Subcutaneous QHS  . lactulose  30 g Oral TID  . loratadine  10 mg Oral Daily  . polyethylene glycol  17 g Oral TID  . povidone-iodine  2 application Topical  Once  . pramipexole  2 mg Oral QHS  . propranolol ER  60 mg Oral Daily  . rifaximin  550 mg Oral BID   Continuous Infusions: . calcium gluconate    . ceFAZolin    .  ceFAZolin (ANCEF) IV    . clindamycin (CLEOCIN) IV 600 mg (03/18/17 0530)    Marzetta Board, MD, PhD [facility] Pager [phone]  If 7PM-7AM, please contact night-coverage [email or web address] Password [email or web address] 03/18/2017, 7:00 AM

## 2017-03-18 NOTE — H&P (View-Only) (Signed)
Reason for Consult:Left femur fracture Referring Physician: Dr. Delila Pereyra Daisy Larson is an 66 y.o. female.  HPI: 66 yo female who was walking approximately 10 foot distance and had a mechanical fall onto her left side. Denies any LOC, dizziness, light headedness, SOB, chest pain, palpitations, headache, leading to fall. Did not have head injury with fall. Main complaint is left hip pain. Also has developed pain in anterior left shoulder since arrival to hospital. Has severe neuropathy left leg thus does not know if she has any new numbness or tingling in the leg. Able to move toes. 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 She was supposed to be wearing a boot on her left foot for ambulation but was not wearing it when she fell tonight.We are consulted for management of her femur fracture  Past Medical History:  Diagnosis Date  . Allergy    tape  . Angina    COSTOCONDRITIS    . Anxiety   . Bell's palsy 2014   twice  . Breast cancer ([place]) 01/19/12    right breast lumpectomy/ER/PR positibe,her-2 neg.  . Breast cancer ([place]) 2011  . Bundle branch block   . Cirrhosis ([place])   . Colon polyps 12/02/2012   TUBULAR ADENOMAS (X2) and Hyperplastic (X1)  . Complication of anesthesia    WITH SECON HERNIA REPAIR 2012 [facility]  DIFFICULTY O2 SAT DROPPING ADMIT TO HOSPITAL   . Depression   . Diabetes mellitus   . Diverticulosis   . External hemorrhoids   . Gastritis   . GERD (gastroesophageal reflux disease)    ULCERS  . Goiter   . Headache(784.0)    MIGRAINES  . Heart murmur   . Hepatitis     Did Not have hepatitis but needed Hep A and Hep B injections2012 SPOTS ON LIVER  DR Alonza Bogus  [phone]  . Hernia   . History of stomach ulcers   . Hypertension   . Hyperthyroidism    NODULES ON THYROID  (DR BALEN 37  12-609)  . Iron deficiency anemia 10/16/2012  . Multiple thyroid nodules   . Neuromuscular disorder ([place])     PERIPH NEUROPATHY   . Neuropathy of foot   . Restless legs   . S/P radiation therapy 02/19/12 - 04/01/12   Right Breast: 5000 cgy/25 Fractions with Boost/ 1000 cGy/5 Fractions  . Short of breath on exertion   . Shortness of breath    WITH EXERTION   . Sleep apnea     Past Surgical History:  Procedure Laterality Date  . ABDOMINAL HYSTERECTOMY    . ANAL FISSURE REPAIR    . APPENDECTOMY    . BACK SURGERY    . BREAST SURGERY     BIOPSY  Right Breast -  Sentinel Lymph Nodes  . CARPAL TUNNEL RELEASE     LEFT   . CESAREAN SECTION    . COLONOSCOPY W/ POLYPECTOMY  12/02/2012  . ESOPHAGOGASTRODUODENOSCOPY  12/02/2012   Gastritis  . HAND SURGERY Bilateral   . HERNIA REPAIR     X 2  . LIVER BIOPSY  03/28/11   [facility] - Cirrhosis  . SPINAL FUSION    . TONSILLECTOMY    . TUBAL LIGATION      Family History  Problem Relation Age of Onset  . Diabetes Mother   . Hypertension Mother   . Heart disease Father   . Hypertension Father   . Hypertension  Other   . Heart disease Other   . Lung cancer Maternal Grandfather   . Colon cancer Paternal Grandfather   . Heart disease Maternal Uncle   . Diabetes Sister   . Heart disease Sister   . Thyroid disease Sister   . Diabetes Daughter     Social History:  reports that she has never smoked. She has never used smokeless tobacco. She reports that she does not drink alcohol or use drugs.  Allergies:  Allergies  Allergen Reactions  . Other Rash    Blisters Per Patient - she has been told she can not have general anesthesia "unless life-threatening"  . Tape   . Oxymorphone     lethargic  . Barbiturates Dermatitis and Other (See Comments)    Drowsiness, patient states that Barbiturates & Narcotics make her extremely sleepy and drowsy 24 Apr 2013 Drowsiness  "sleeps forever"    Medications: I have reviewed the patient's current medications.  Results for orders placed or performed during the hospital encounter of 03/16/17  (from the past 48 hour(s))  Comprehensive metabolic panel     Status: Abnormal   Collection Time: 03/16/17  7:03 PM  Result Value Ref Range   Sodium 140 135 - 145 mmol/L   Potassium 5.2 (H) 3.5 - 5.1 mmol/L   Chloride 106 101 - 111 mmol/L   CO2 26 22 - 32 mmol/L   Glucose, Bld 235 (H) 65 - 99 mg/dL   BUN 19 6 - 20 mg/dL   Creatinine, Ser 1.63 (H) 0.44 - 1.00 mg/dL   Calcium 8.9 8.9 - 10.3 mg/dL   Total Protein 6.6 6.5 - 8.1 g/dL   Albumin 2.6 (L) 3.5 - 5.0 g/dL   AST 52 (H) 15 - 41 U/L   ALT 28 14 - 54 U/L   Alkaline Phosphatase 162 (H) 38 - 126 U/L   Total Bilirubin 0.6 0.3 - 1.2 mg/dL   GFR calc non Af Amer 32 (L) >60 mL/min   GFR calc Af Amer 37 (L) >60 mL/min    Comment: (NOTE) The eGFR has been calculated using the CKD EPI equation. This calculation has not been validated in all clinical situations. eGFR's persistently <60 mL/min signify possible Chronic Kidney Disease.    Anion gap 8 5 - 15  CBC with Differential     Status: Abnormal   Collection Time: 03/16/17  7:03 PM  Result Value Ref Range   WBC 5.0 4.0 - 10.5 K/uL   RBC 2.74 (L) 3.87 - 5.11 MIL/uL   Hemoglobin 8.7 (L) 12.0 - 15.0 g/dL   HCT 26.8 (L) 36.0 - 46.0 %   MCV 97.8 78.0 - 100.0 fL   MCH 31.8 26.0 - 34.0 pg   MCHC 32.5 30.0 - 36.0 g/dL   RDW 15.0 11.5 - 15.5 %   Platelets 112 (L) 150 - 400 K/uL    Comment: SPECIMEN CHECKED FOR CLOTS PLATELET COUNT CONFIRMED BY SMEAR    Neutrophils Relative % 71 %   Neutro Abs 3.5 1.7 - 7.7 K/uL   Lymphocytes Relative 18 %   Lymphs Abs 0.9 0.7 - 4.0 K/uL   Monocytes Relative 8 %   Monocytes Absolute 0.4 0.1 - 1.0 K/uL   Eosinophils Relative 3 %   Eosinophils Absolute 0.1 0.0 - 0.7 K/uL   Basophils Relative 0 %   Basophils Absolute 0.0 0.0 - 0.1 K/uL  Protime-INR     Status: None   Collection Time: 03/16/17  7:03 PM  Result  Value Ref Range   Prothrombin Time 14.4 11.4 - 15.2 seconds   INR 1.11   Type and screen [place]     Status: None    Collection Time: 03/16/17  7:49 PM  Result Value Ref Range   ABO/RH(D) A POS    Antibody Screen NEG    Sample Expiration 03/19/2017     Dg Chest 1 View  Result Date: 03/16/2017 CLINICAL DATA:  Preop for hip fracture. EXAM: CHEST 1 VIEW COMPARISON:  08/26/2015 FINDINGS: Borderline cardiomegaly. Chronic left sixth and seventh rib fractures with callus. No pneumonic consolidation, effusion or pneumothorax. IMPRESSION: No pneumonic consolidation. Electronically Signed   By: Ashley Royalty M.D.   On: 03/16/2017 21:10   Dg Thoracic Spine 2 View  Result Date: 03/16/2017 CLINICAL DATA:  Pt fell on L hip after slipping on bandaged part of L foot while searching for remote. Reports L lateral hip pain and lower back pain. Pt also states she has neuropathy to lower left leg and is unsure if she broke anything. EXAM: THORACIC SPINE 2 VIEWS COMPARISON:  08/25/2016 FINDINGS: Body habitus and motion artifact reduced diagnostic sensitivity and specificity. Thoracic spondylosis. Upper thoracic spine not well depicted due to the body habitus and patient's shoulders. No obvious compression in the mid and lower thoracic spine. IMPRESSION: 1. No definite fracture or subluxation, upper thoracic spine not well seen on the lateral projection due to body habitus and motion artifact. 2. Mild thoracic spondylosis. Electronically Signed   By: Van Clines M.D.   On: 03/16/2017 19:11   Dg Lumbar Spine Complete  Result Date: 03/16/2017 CLINICAL DATA:  Pt fell on L hip after slipping on bandaged part of L foot while searching for remote. Reports L lateral hip pain and lower back pain. Pt also states she has neuropathy to lower left leg and is unsure if she broke anything. EXAM: LUMBAR SPINE - COMPLETE 4+ VIEW COMPARISON:  11/11/2012 CT abdomen FINDINGS: PLIF at G2-5 without complicating feature. Mild bony demineralization. The arcuate lines of the sacrum appear continuous. SI joints unremarkable. Clustered density in the  right upper quadrant compatible with mulberry type gallstone. Bony detail is lost on the lateral projection due to body habitus, but there is no obvious vertebral collapse or malalignment. IMPRESSION: 1. No acute lumbar spine findings. 2. 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 3. Remote PLIF at L4-5. Electronically Signed   By: Van Clines M.D.   On: 03/16/2017 19:13   Dg Tibia/fibula Left  Result Date: 03/16/2017 CLINICAL DATA:  Pt fell on L hip after slipping on bandaged part of L foot while searching for remote. Reports L lateral hip pain and lower back pain. Pt also states she has neuropathy to lower left leg and is unsure if she broke anything. EXAM: LEFT TIBIA AND FIBULA - 2 VIEW COMPARISON:  Knee radiographs from 08/22/2012 FINDINGS: Periosteal reaction and some deformity in the mid tibia and fibula, but with a chronic appearance possibly related to prior fractures and/ or hardware. Extensive bony hindfoot deformity, with presumed fracture or collapse of the talus, and fusion of the distal tibia and fibula with the calcaneus through 2 lag screws. There is considerable lucency around both lag screws suggesting loosening or infection, and I can't exclude pseudoarticulation of the distal tibia and fibula with the irregular and partially collapsed calcaneus. Severe degeneration of the Chopart joint and poor visualization of the cuboid. Calcifications along the expected location of the Achilles tendon. IMPRESSION: 1.  Extensive hindfoot deformity and collapse. Lucency around the hindfoot lag screws suggesting loosening or infection. 2. Chronic appearing periostitis in the mid mid shafts of the tibia and fibula extending to the ankle, with midshaft deformities possibly related to prior hardware. Electronically Signed   By: Van Clines M.D.   On: 03/16/2017 19:17   Dg Foot Complete Left  Result Date: 03/16/2017 CLINICAL DATA:  Pt fell on L hip after slipping on bandaged part of L  foot while searching for remote. Reports L lateral hip pain and lower back pain. Pt also states she has neuropathy to lower left leg and is unsure if she broke anything. EXAM: LEFT FOOT - COMPLETE 3+ VIEW COMPARISON:  None. FINDINGS: Hindfoot collapse with nonvisualization of the talus, collapse and partial fragmentation of the navicular, severe arthropathy at the Chopart joint, severe arthropathy in the dorsal midfoot, and attempted fusion of the flattened calcaneus with the distal tibia and fibula with lucency along the rib lag screws suggesting loosening or infection. Cough probable pseudarthrosis at the articulation between the tibia/ fibula and the flattened calcaneus. Ossific fragments along the expected Achilles tendon location. Bony demineralization. No malalignment at the Lisfranc joint. Transverse indistinct lucency in the distal phalanx great toe, age indeterminate fracture. IMPRESSION: 1. Complex collapse of the hindfoot and part of the midfoot. Probable loosening along the lag screws extending through the collapsed calcaneus into the tibia, and pseudarthrosis between the tibia/fibula and the collapsed calcaneus. 2. Age-indeterminate fracture of the distal phalanx great toe. 3. Bony demineralization. Electronically Signed   By: Van Clines M.D.   On: 03/16/2017 19:20   Dg Hip Unilat With Pelvis 2-3 Views Left  Result Date: 03/16/2017 CLINICAL DATA:  Pt fell on L hip after slipping on bandaged part of L foot while searching for remote. Reports L lateral hip pain and lower back pain. Pt also states she has neuropathy to lower left leg and is unsure if she broke anything. EXAM: DG HIP (WITH OR WITHOUT PELVIS) 2-3V LEFT COMPARISON:  None FINDINGS: PLIF at L4-5. Comminuted left intertrochanteric hip fracture, with separate lesser trochanteric fragment. Varus angulation at the fracture site. Bony demineralization in the pelvis. IMPRESSION: 1. Comminuted left hip intertrochanteric fracture.  Electronically Signed   By: Van Clines M.D.   On: 03/16/2017 19:10    ROS Blood pressure (!) 114/39, pulse 65, temperature 98.3 F (36.8 C), temperature source Oral, resp. rate 15, height _0  (1.727 m), weight 111.1 kg (245 lb), SpO2 95 %. Physical Exam Physical Examination: General appearance - alert, well appearing, and in no distress Mental status - alert, oriented to person, place, and time Neurological - alert, oriented, normal speech, no focal findings or movement disorder noted Left lower extremity slightly short and externally rotated, tender left hip, pelvis stable to compression and distraction, wiggles toes, 2 + DP pulse, pain on any attempted hip motion' abrasion over left knee' no knee effusion or tenderness, has dressing over left foot Slight tenderness over left AC joint. No pain on passive shoulder motion except elevation above 90, no deformity, no tenderness cervical spine,   Assessment/Plan: Left intertrochanteric femur fracture- Will require operative fixation to allow for potential ambulation again. She has multiple medical issues with significant anemia which should be optimized  prior to surgery. Discussed with Dr. Roel Cluck and will order one unit PRBC transfusion now. Surgery will be tomorrow or day after by one of my partners.  Gearlean Alf 03/16/2017, 9:47 PM

## 2017-03-19 ENCOUNTER — Encounter (HOSPITAL_COMMUNITY): Payer: Self-pay | Admitting: Orthopedic Surgery

## 2017-03-19 DIAGNOSIS — J9622 Acute and chronic respiratory failure with hypercapnia: Secondary | ICD-10-CM

## 2017-03-19 LAB — CBC
HEMATOCRIT: 28.2 % — AB (ref 36.0–46.0)
Hemoglobin: 8.8 g/dL — ABNORMAL LOW (ref 12.0–15.0)
MCH: 30.6 pg (ref 26.0–34.0)
MCHC: 31.2 g/dL (ref 30.0–36.0)
MCV: 97.9 fL (ref 78.0–100.0)
PLATELETS: 133 10*3/uL — AB (ref 150–400)
RBC: 2.88 MIL/uL — AB (ref 3.87–5.11)
RDW: 14.7 % (ref 11.5–15.5)
WBC: 10.4 10*3/uL (ref 4.0–10.5)

## 2017-03-19 LAB — GLUCOSE, CAPILLARY
GLUCOSE-CAPILLARY: 302 mg/dL — AB (ref 65–99)
GLUCOSE-CAPILLARY: 299 mg/dL — AB (ref 65–99)
GLUCOSE-CAPILLARY: 338 mg/dL — AB (ref 65–99)
GLUCOSE-CAPILLARY: 328 mg/dL — AB (ref 65–99)
Glucose-Capillary: 358 mg/dL — ABNORMAL HIGH (ref 65–99)

## 2017-03-19 LAB — COMPREHENSIVE METABOLIC PANEL
ALK PHOS: 116 U/L (ref 38–126)
ALT: 20 U/L (ref 14–54)
ANION GAP: 8 (ref 5–15)
AST: 39 U/L (ref 15–41)
Albumin: 2.3 g/dL — ABNORMAL LOW (ref 3.5–5.0)
BILIRUBIN TOTAL: 1.2 mg/dL (ref 0.3–1.2)
BUN: 26 mg/dL — AB (ref 6–20)
CO2: 23 mmol/L (ref 22–32)
Calcium: 8.1 mg/dL — ABNORMAL LOW (ref 8.9–10.3)
Chloride: 109 mmol/L (ref 101–111)
Creatinine, Ser: 1.35 mg/dL — ABNORMAL HIGH (ref 0.44–1.00)
GFR calc Af Amer: 46 mL/min — ABNORMAL LOW (ref >60)
GFR, EST NON AFRICAN AMERICAN: 40 mL/min — AB (ref >60)
Glucose, Bld: 324 mg/dL — ABNORMAL HIGH (ref 65–99)
POTASSIUM: 5.2 mmol/L — AB (ref 3.5–5.1)
Sodium: 140 mmol/L (ref 135–145)
TOTAL PROTEIN: 6.3 g/dL — AB (ref 6.5–8.1)

## 2017-03-19 LAB — TYPE AND SCREEN
ABO/RH(D): A POS
Antibody Screen: NEGATIVE
UNIT DIVISION: 0

## 2017-03-19 LAB — BPAM RBC
Blood Product Expiration Date: 201806022359
ISSUE DATE / TIME: 201805190018
UNIT TYPE AND RH: 6200

## 2017-03-19 LAB — VITAMIN D 25 HYDROXY (VIT D DEFICIENCY, FRACTURES): Vit D, 25-Hydroxy: 27.3 ng/mL — ABNORMAL LOW (ref 30.0–100.0)

## 2017-03-19 MED ORDER — HYDROCODONE-ACETAMINOPHEN 5-325 MG PO TABS
1.0000 | ORAL_TABLET | Freq: Four times a day (QID) | ORAL | 0 refills | Status: DC | PRN
Start: 2017-03-19 — End: 2017-04-16

## 2017-03-19 MED ORDER — ENOXAPARIN SODIUM 40 MG/0.4ML ~~LOC~~ SOLN: 40.0000 mg | SUBCUTANEOUS | 0 refills | Status: DC

## 2017-03-19 MED ORDER — SODIUM POLYSTYRENE SULFONATE 15 GM/60ML PO SUSP
15.0000 g | Freq: Once | ORAL | Status: AC
  Administered 2017-03-19: 15 g via ORAL
  Filled 2017-03-19: qty 60

## 2017-03-19 NOTE — NC FL2 (Signed)
[place] LEVEL OF CARE SCREENING TOOL     IDENTIFICATION  Patient Name: Daisy Larson Birthdate: 1950-11-16 Sex: female Admission Date (Current Location): 03/16/2017  [facility] and [place] Number:  Herbalist and Address:  [facility],  [place] [address] [place]      Provider Number: [id number]  Attending Physician Name and Address:  Caren Griffins, MD  Relative Name and Phone Number:       Current Level of Care: Hospital Recommended Level of Care: [place] Prior Approval Number:    Date Approved/Denied:   PASRR Number:    Discharge Plan: SNF    Current Diagnoses: Patient Active Problem List   Diagnosis Date Noted  . Closed comminuted intertrochanteric fracture of left femur ([place]) 03/18/2017  . Hyperkalemia 03/16/2017  . Charcot ankle, left 03/16/2017  . Other cirrhosis of liver ([place]) 03/16/2017  . Hip fracture ([place]) 03/16/2017  . Prolonged QT interval 03/16/2017  . Hx of Bell's palsy 08/27/2014  . Essential hypertension 08/27/2014  . History of cardiac dysrhythmia 08/27/2014  . Charcot's joint of right foot 08/27/2014  . Chronic hypercapnic respiratory failure ([place]) 08/27/2014  . Long-term insulin use ([place]) 08/27/2014  . Depression, major, recurrent ([place]) 08/27/2014  . Nontoxic multinodular goiter 06/24/2014  . Osteopenia 11/13/2013  . Thrombocytopenia, unspecified ([place]) 05/12/2013  . Iron deficiency anemia 10/16/2012  . Encephalopathy acute 10/09/2012  . OSA (obstructive sleep apnea) 10/09/2012  . 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 Pica in adults 10/08/2012  . Anemia 10/08/2012  . Syncope 10/07/2012  . History of breast cancer in female 01/19/2012  . Malignant neoplasm of upper-outer quadrant of right breast in female, estrogen receptor positive ([place]) 01/09/2012  . Type 2 diabetes, uncontrolled, with neuropathy ([place]) 12/27/2010  . RESTLESS LEGS SYNDROME 12/27/2010  . Diabetic  polyneuropathy ([place]) 12/27/2010  . DM2 (diabetes mellitus, type 2) ([place]) 09/14/2010  . HYPERLIPIDEMIA, MIXED, WITH HIGH HDL 09/14/2010  . Essential hypertension, benign 09/14/2010  . PERSONAL HISTORY OF COLONIC POLYPS 09/14/2010    Orientation RESPIRATION BLADDER Height & Weight     Self, Time, Situation, Place  O2 (CPAP at night) Indwelling catheter Weight: 245 lb (111.1 kg) Height:  5\' 8"  (172.7 cm)  BEHAVIORAL SYMPTOMS/MOOD NEUROLOGICAL BOWEL NUTRITION STATUS  Other (Comment) (no behaviors)   Continent Diet (Heart Healthy)  AMBULATORY STATUS COMMUNICATION OF NEEDS Skin   Extensive Assist Verbally Other (Comment) (non pressure wound on left ankle)                       Personal Care Assistance Level of Assistance  Bathing, Feeding, Dressing Bathing Assistance: Limited assistance Feeding assistance: Independent Dressing Assistance: Limited assistance     Functional Limitations Info  Sight, Hearing, Speech Sight Info: Adequate Hearing Info: Adequate Speech Info: Adequate    SPECIAL CARE FACTORS FREQUENCY  PT (By licensed PT), OT (By licensed OT)     PT Frequency: 5x wk OT Frequency: 5x wk            Contractures Contractures Info: Not present    Additional Factors Info  Code Status Code Status Info: Full Code             Current Medications (03/19/2017):  This is the current hospital active medication list Current Facility-Administered Medications  Medication Dose Route Frequency Provider Last Rate Last Dose  . acetaminophen (TYLENOL) tablet 650 mg  650 mg Oral Q6H PRN Rod Can, MD  Or  . acetaminophen (TYLENOL) suppository 650 mg  650 mg Rectal Q6H PRN Swinteck, Aaron Edelman, MD      . bisacodyl (DULCOLAX) suppository 10 mg  10 mg Rectal Daily PRN Toy Baker, MD      . buPROPion (WELLBUTRIN XL) 24 hr tablet 300 mg  300 mg Oral Daily Doutova, Anastassia, MD   300 mg at 03/19/17 1040  . clindamycin (CLEOCIN) IVPB 600 mg  600 mg Intravenous  Q8H Caren Griffins, MD   Stopped at 03/19/17 0544  . DULoxetine (CYMBALTA) DR capsule 60 mg  60 mg Oral Daily Doutova, Anastassia, MD   60 mg at 03/19/17 1040  . enoxaparin (LOVENOX) injection 40 mg  40 mg Subcutaneous Q24H Rod Can, MD   40 mg at 03/19/17 0824  . gabapentin (NEURONTIN) capsule 800 mg  800 mg Oral TID Toy Baker, MD   800 mg at 03/19/17 1040  . HYDROcodone-acetaminophen (NORCO/VICODIN) 5-325 MG per tablet 1-2 tablet  1-2 tablet Oral Q6H PRN Toy Baker, MD   1 tablet at 03/18/17 0411  . insulin aspart (novoLOG) injection 0-9 Units  0-9 Units Subcutaneous Q4H Toy Baker, MD   7 Units at 03/19/17 1215  . insulin glargine (LANTUS) injection 65 Units  65 Units Subcutaneous QHS Toy Baker, MD   65 Units at 03/18/17 2224  . lactulose (CHRONULAC) 10 GM/15ML solution 30 g  30 g Oral TID Caren Griffins, MD   30 g at 03/19/17 1040  . loratadine (CLARITIN) tablet 10 mg  10 mg Oral Daily Doutova, Anastassia, MD   10 mg at 03/19/17 1040  . menthol-cetylpyridinium (CEPACOL) lozenge 3 mg  1 lozenge Oral PRN Swinteck, Aaron Edelman, MD       Or  . phenol (CHLORASEPTIC) mouth spray 1 spray  1 spray Mouth/Throat PRN Swinteck, Aaron Edelman, MD      . metoCLOPramide (REGLAN) tablet 5-10 mg  5-10 mg Oral Q8H PRN Swinteck, Aaron Edelman, MD       Or  . metoCLOPramide (REGLAN) injection 5-10 mg  5-10 mg Intravenous Q8H PRN Swinteck, Aaron Edelman, MD      . ondansetron (ZOFRAN) tablet 4 mg  4 mg Oral Q6H PRN Swinteck, Aaron Edelman, MD       Or  . ondansetron (ZOFRAN) injection 4 mg  4 mg Intravenous Q6H PRN Swinteck, Aaron Edelman, MD      . polyethylene glycol (MIRALAX / GLYCOLAX) packet 17 g  17 g Oral TID Toy Baker, MD   17 g at 03/19/17 1041  . pramipexole (MIRAPEX) tablet 2 mg  2 mg Oral QHS Toy Baker, MD   Stopped at 03/18/17 2226  . propranolol ER (INDERAL LA) 24 hr capsule 60 mg  60 mg Oral Daily Doutova, Anastassia, MD   60 mg at 03/19/17 1041  . rifaximin (XIFAXAN)  tablet 550 mg  550 mg Oral BID Toy Baker, MD   550 mg at 03/19/17 1041     Discharge Medications: Please see discharge summary for a list of discharge medications.  Relevant Imaging Results:  Relevant Lab Results:   Additional Information SS # [id number]  Aleksia Freiman, Randall An, LCSW

## 2017-03-19 NOTE — Care Management Note (Signed)
Case Management Note  Patient Details  Name: Daisy Larson MRN: [id number] Date of Birth: Mar 06, 1951  Subjective/Objective:                  POSTOPERATIVE DIAGNOSIS: Comminuted Left intertrochanteric femur fracture.   PROCEDURE: Intramedullary fixation, Left femur.    Action/Plan: Date:  Mar 19, 2017  Chart reviewed for concurrent status and case management needs.  Will continue to follow patient progress.  Discharge Planning: following for needs  Expected discharge date: [id number]  Velva Harman, BSN, [place], [place]   Expected Discharge Date:   (unknown)               Expected Discharge Plan:  [place]  In-House Referral:  Clinical Social Work  Discharge planning Services  CM Consult  Post Acute Care Choice:    Choice offered to:     DME Arranged:    DME Agency:     HH Arranged:    [place] Agency:     Status of Service:  In process, will continue to follow  If discussed at Long Length of Stay Meetings, dates discussed:    Additional Comments:  Leeroy Cha, RN 03/19/2017, 10:31 AM

## 2017-03-19 NOTE — Progress Notes (Signed)
Inpatient Diabetes Program Recommendations  AACE/ADA: New Consensus Statement on Inpatient Glycemic Control (2015)  Target Ranges:  Prepandial:   less than 140 mg/dL      Peak postprandial:   less than 180 mg/dL (1-2 hours)      Critically ill patients:  140 - 180 mg/dL   Review of Glycemic Control  Diabetes history: DM 2 Current orders for Inpatient glycemic control: Lantus 65 units, Novolog Sensitive Q4hours  Inpatient Diabetes Program Recommendations:    Glucose in the high 200-300 range. Glucose increases after meal times.  Consider increasing Lantus to 38 units BID (total 75 units daily)  Consider Novolog 5 units tid Meal coverage if at least 50% of meals are consumed.  Thanks,  Tama Headings RN, MSN, [facility] Inpatient Diabetes Coordinator Team Pager [phone] (8a-5p)

## 2017-03-19 NOTE — Progress Notes (Signed)
OT Cancellation Note  Patient Details Name: Daisy Larson MRN: [id number] DOB: 01-01-1951   Cancelled Treatment:    Reason Eval/Treat Not Completed: Other (comment)  Noted plan for  ST SNF- will defer OT eval to SNF [facility], [place]  [name] 03/19/2017, 12:50 PM

## 2017-03-19 NOTE — Progress Notes (Signed)
PROGRESS NOTE  Daisy Larson MLY:[id number] DOB: 01-12-1951 DOA: 03/16/2017 PCP: [name], MD   LOS: 3 days   Brief Narrative: 66 year old female with history of invasive carcinoma, Charcot foot and diabetic arthropathy status post several surgical interventions, complicated by intra-articular infection status post intra-articular bead placement about 3 weeks ago, currently on Bactrim.  She also has a history of nonalcoholic cirrhosis for which she is followed by hepatology at [facility].  She comes to the hospital status post mechanical fall with a hip fracture.  Assessment & Plan: Active Problems:   DM2 (diabetes mellitus, type 2) (HCC)   Type 2 diabetes, uncontrolled, with neuropathy (HCC)   RESTLESS LEGS SYNDROME   Diabetic polyneuropathy (HCC)   Malignant neoplasm of upper-outer quadrant of right breast in female, estrogen receptor positive (HCC)   Anemia   OSA (obstructive sleep apnea)   Thrombocytopenia, unspecified (HCC)   Essential hypertension   Hyperkalemia   Charcot ankle, left   Other cirrhosis of liver (HCC)   Hip fracture (HCC)   Prolonged QT interval   Closed comminuted intertrochanteric fracture of left femur (HCC)   Hip fracture -Orthopedic surgery consulted, appreciate input.  -She is status post intramedullary fixation on the left femur on 5/20 by Dr. [name]  Acute blood loss anemia -Expected postop, there is a component of chronic anemia from her liver disease, hemoglobin 8.8 this morning.  No need for transfusions, continue to monitor  Liver cirrhosis -Nonalcoholic -Ammonia level borderline elevated, continue home lactulose as well as rifaximin  -Mental status close to baseline this morning  Respiratory acidosis with acute hypercarbic respiratory failure -s/p BiPAP, ABG last night reassuring -Improved, continue nightly CPAP  Hyperkalemia -lagging due to Bactrim/AKI/Spironolactone -Mild, received Kayexalate yesterday, will  repeat that dose today  Obstructive sleep apnea -Ensure that CPAP is on tonight  Acute encephalopathy -Likely multifactorial, component of hepatic encephalopathy as well as hypercarbic respiratory failure -Back to baseline this morning  Thrombocytopenia -Likely due to liver cirrhosis currently platelets above 100K, stable, continue to monitor  Left foot wounds -This is postsurgery, wound care consulted, continue Betadine as per family recommendations as they have been doing at home -continue clindamycin   Acute kidney injury -?  In the setting of Bactrim, improving -Hold Bactrim for now -Creatinine slightly worse today at 1.35, but overall stable.  Monitor  Restless leg syndrome -Continue home medications  Diabetes mellitus and diabetic neuropathy -Continue sliding scale, continue Neurontin    DVT prophylaxis: SCDs Code Status: Full code Family Communication: Discussed with patient's daughter at bedside Disposition Plan: SNF when ready  Consultants:   Orthopedic surgery  Procedures:   BiPAP: yes  Antimicrobials:  Bactrim before admission >> 5/19  Clindamycin 5/19>>  Subjective: -BiPAP on, no complaints this morning.  Objective: Vitals:   03/19/17 0600 03/19/17 0700 03/19/17 0800 03/19/17 0900  BP: (!) 138/56 (!) 151/62 (!) 117/30 137/63  Pulse: 74 74 78 77  Resp: (!) 21 19 19 20   Temp:   98.4 F (36.9 C)   TempSrc:   Oral   SpO2: 99% 100% 97% 97%  Weight:      Height:        Intake/Output Summary (Last 24 hours) at 03/19/17 1123 Last data filed at 03/19/17 0736  Gross per 24 hour  Intake          1050.67 ml  Output              975 ml  Net  75.67 ml   Filed Weights   03/16/17 1725 03/16/17 1727 03/16/17 2306  Weight: 111.1 kg (245 lb) 111.1 kg (245 lb) 111.1 kg (245 lb)    Examination:  Vitals:   03/19/17 0600 03/19/17 0700 03/19/17 0800 03/19/17 0900  BP: (!) 138/56 (!) 151/62 (!) 117/30 137/63  Pulse: 74 74 78 77  Resp:  (!) 21 19 19 20   Temp:   98.4 F (36.9 C)   TempSrc:   Oral   SpO2: 99% 100% 97% 97%  Weight:      Height:       Constitutional: NAD, calm, comfortable Eyes:  lids and conjunctivae normal Respiratory: clear to auscultation bilaterally, no wheezing, no crackles. Normal respiratory effort.  Cardiovascular: Regular rate and rhythm, no murmurs / rubs / gallops. No LE edema Abdomen: no tenderness. Bowel sounds positive.  Neurologic: non focal   Data Reviewed: I have personally reviewed following labs and imaging studies  CBC:  Recent Labs Lab 03/16/17 1903 03/17/17 0649 03/18/17 0345 03/19/17 0345  WBC 5.0 8.3 7.8 10.4  NEUTROABS 3.5  --   --   --   HGB 8.7* 10.1* 10.0* 8.8*  HCT 26.8* 31.6* 32.0* 28.2*  MCV 97.8 98.4 98.8 97.9  PLT 112* 125* 110* 364*   Basic Metabolic Panel:  Recent Labs Lab 03/16/17 1903 03/17/17 0649 03/18/17 0345 03/19/17 0345  NA 140 137 137 140  K 5.2* 5.4* 5.7* 5.2*  CL 106 103 105 109  CO2 26 27 25 23   GLUCOSE 235* 140* 150* 324*  BUN 19 17 18  26*  CREATININE 1.63* 1.35* 1.27* 1.35*  CALCIUM 8.9 8.5* 8.3* 8.1*   GFR: Estimated Creatinine Clearance: 53.6 mL/min (A) (by C-G formula based on SCr of 1.35 mg/dL (H)). Liver Function Tests:  Recent Labs Lab 03/16/17 1903 03/17/17 0649 03/18/17 0345 03/19/17 0345  AST 52*  --  44* 39  ALT 28  --  27 20  ALKPHOS 162*  --  144* 116  BILITOT 0.6  --  1.6* 1.2  PROT 6.6  --  6.8 6.3*  ALBUMIN 2.6* 2.7* 2.7* 2.3*   No results for input(s): LIPASE, AMYLASE in the last 168 hours.  Recent Labs Lab 03/17/17 0920 03/18/17 1953  AMMONIA 49* 38*   Coagulation Profile:  Recent Labs Lab 03/16/17 1903 03/18/17 0345  INR 1.11 1.27   Cardiac Enzymes: No results for input(s): CKTOTAL, CKMB, CKMBINDEX, TROPONINI in the last 168 hours. BNP (last 3 results) No results for input(s): PROBNP in the last 8760 hours. HbA1C:  Recent Labs  03/17/17 0649  HGBA1C 6.6*   CBG:  Recent  Labs Lab 03/18/17 1254 03/18/17 1556 03/18/17 2022 03/19/17 0426 03/19/17 0756  GLUCAP 208* 293* 334* 302* 299*   Lipid Profile: No results for input(s): CHOL, HDL, LDLCALC, TRIG, CHOLHDL, LDLDIRECT in the last 72 hours. Thyroid Function Tests: No results for input(s): TSH, T4TOTAL, FREET4, T3FREE, THYROIDAB in the last 72 hours. Anemia Panel: No results for input(s): VITAMINB12, FOLATE, FERRITIN, TIBC, IRON, RETICCTPCT in the last 72 hours. Urine analysis:    Component Value Date/Time   COLORURINE YELLOW 03/16/2017 2348   APPEARANCEUR CLEAR 03/16/2017 2348   LABSPEC 1.011 03/16/2017 2348   PHURINE 6.0 03/16/2017 2348   GLUCOSEU NEGATIVE 03/16/2017 2348   GLUCOSEU NEGATIVE 09/14/2015 0808   HGBUR NEGATIVE 03/16/2017 2348   BILIRUBINUR NEGATIVE 03/16/2017 [place] 03/16/2017 2348   PROTEINUR NEGATIVE 03/16/2017 2348   UROBILINOGEN 1.0 09/14/2015 0808   NITRITE  NEGATIVE 03/16/2017 2348   LEUKOCYTESUR NEGATIVE 03/16/2017 2348   Sepsis Labs: Invalid input(s): PROCALCITONIN, LACTICIDVEN  Recent Results (from the past 240 hour(s))  Surgical pcr screen     Status: None   Collection Time: 03/17/17 10:18 AM  Result Value Ref Range Status   MRSA, PCR NEGATIVE NEGATIVE Final   Staphylococcus aureus NEGATIVE NEGATIVE Final    Comment:        The Xpert SA Assay (FDA approved for NASAL specimens in patients over 48 years of age), is one component of a comprehensive surveillance program.  Test performance has been validated by [facility] for patients greater than or equal to 74 year old. It is not intended to diagnose infection nor to guide or monitor treatment.       Radiology Studies: Pelvis Portable  Result Date: 03/18/2017 CLINICAL DATA:  Internal fixation left hip fracture EXAM: PORTABLE PELVIS 1-2 VIEWS COMPARISON:  03/16/2017 FINDINGS: Changes of internal fixation across the left femoral intertrochanteric fracture. Moderate displacement of the  lesser trochanter noted. Otherwise near anatomic alignment. No hardware complicating features. IMPRESSION: Internal fixation across the left femoral intertrochanteric fracture with displaced lesser trochanter. Electronically Signed   By: Rolm Baptise M.D.   On: 03/18/2017 11:27   Dg C-arm 61-120 Min-no Report  Result Date: 03/18/2017 Fluoroscopy was utilized by the requesting physician.  No radiographic interpretation.   Dg Femur Min 2 Views Left  Result Date: 03/18/2017 CLINICAL DATA:  ORIF left hip fracture EXAM: LEFT FEMUR 2 VIEWS; DG C-ARM 61-120 MIN-NO REPORT COMPARISON:  03/16/2017 FINDINGS: Internal fixation across the left femoral intertrochanteric fracture. Anatomic alignment. No hardware complicating feature. IMPRESSION: Internal fixation across the left intertrochanteric fracture. Electronically Signed   By: Rolm Baptise M.D.   On: 03/18/2017 09:57   Dg Femur Port Min 2 Views Left  Result Date: 03/18/2017 CLINICAL DATA:  Internal fixation EXAM: LEFT FEMUR PORTABLE 2 VIEWS COMPARISON:  03/16/2017 FINDINGS: Intramedullary nail and dynamic hip screw noted across the right femoral intertrochanteric fracture. Displacement of the lesser trochanter since. Otherwise near anatomic alignment. No hardware complicating feature. IMPRESSION: Internal fixation across the left femoral intertrochanteric fracture with displaced lesser trochanter. Electronically Signed   By: Rolm Baptise M.D.   On: 03/18/2017 11:27     Scheduled Meds: . buPROPion  300 mg Oral Daily  . DULoxetine  60 mg Oral Daily  . enoxaparin (LOVENOX) injection  40 mg Subcutaneous Q24H  . gabapentin  800 mg Oral TID  . insulin aspart  0-9 Units Subcutaneous Q4H  . insulin glargine  65 Units Subcutaneous QHS  . lactulose  30 g Oral TID  . loratadine  10 mg Oral Daily  . polyethylene glycol  17 g Oral TID  . pramipexole  2 mg Oral QHS  . propranolol ER  60 mg Oral Daily  . rifaximin  550 mg Oral BID   Continuous Infusions: .  clindamycin (CLEOCIN) IV Stopped (03/19/17 0544)    Marzetta Board, MD, PhD [facility] Pager [phone]  If 7PM-7AM, please contact night-coverage [email or web address] Password [facility] 03/19/2017, 11:23 AM

## 2017-03-19 NOTE — Evaluation (Signed)
Physical Therapy Evaluation Patient Details Name: Daisy Larson MRN: [id number] DOB: 27-Jan-1951 Today's Date: 03/19/2017   History of Present Illness  66 year old female with history of malignant neoplasm of upper-outer quadrant of right breast in female, Charcot foot and diabetic arthropathy status post several surgical interventions, complicated by intra-articular infection status post intra-articular bead placement about 3 weeks ago, currently on Bactrim, nonalcoholic cirrhosis and admitted after sustaining Comminuted Left intertrochanteric femur fracture now s/p L IM nail  Clinical Impression  Patient is s/p above surgery resulting in functional limitations due to the deficits listed below (see PT Problem List).  Patient will benefit from skilled PT to increase their independence and safety with mobility to allow discharge to the venue listed below.  Pt with decreased cognitive status following L hip surgery (daughter believes from nonalcoholic cirrhosis and anesthesia).  Recommend SNF upon d/c prior to home due to poor cognition, limited mobility, and increased pain with mobility.     Follow Up Recommendations SNF;Supervision/Assistance - 24 hour    Equipment Recommendations  None recommended by PT    Recommendations for Other Services       Precautions / Restrictions Precautions Precautions: Fall Required Braces or Orthoses: Other Brace/Splint Other Brace/Splint: pt requires diabetic shoe and "special" shoe for L foot s/p antibiotic beads  Restrictions Weight Bearing Restrictions: Yes LLE Weight Bearing: Touchdown weight bearing      Mobility  Bed Mobility Overal bed mobility: Needs Assistance Bed Mobility: Supine to Sit;Sit to Supine     Supine to sit: HOB elevated;Max assist Sit to supine: +2 for physical assistance;Total assist   General bed mobility comments: assist to bring LEs to EOB, assist for trunk upright, pt able to assist with scooting with multimodal  cues and support for L LE  Transfers                 General transfer comment: attempted however pt unable, also attempting to take weight on L LE  Ambulation/Gait                Stairs            Wheelchair Mobility    Modified Rankin (Stroke Patients Only)       Balance Overall balance assessment: Needs assistance;History of Falls Sitting-balance support: No upper extremity supported;Feet supported Sitting balance-Leahy Scale: Fair                                       Pertinent Vitals/Pain Pain Assessment: Faces Faces Pain Scale: Hurts even more Pain Location: L hip with movement Pain Descriptors / Indicators: Grimacing;Guarding Pain Intervention(s): Limited activity within patient's tolerance;Monitored during session;Repositioned;Ice applied    Home Living   Living Arrangements: Spouse/significant other   Type of Home: House Home Access: Stairs to enter   [employer] of Steps: 5 Home Layout: One level Home Equipment: Walker - 2 wheels;Electric scooter      Prior Function Level of Independence: Independent with assistive device(s)         Comments: typically uses scooter, had just been given okay to Joaquin on L foot (6 months NWB)     Hand Dominance        Extremity/Trunk Assessment        Lower Extremity Assessment Lower Extremity Assessment: Generalized weakness;LLE deficits/detail LLE Deficits / Details: L LE has been NWB for about 6 months, assist required for pain  Cervical / Trunk Assessment Cervical / Trunk Assessment: Normal  Communication   Communication: No difficulties  Cognition Arousal/Alertness: Awake/alert Behavior During Therapy: WFL for tasks assessed/performed Overall Cognitive Status: Impaired/Different from baseline Area of Impairment: Following commands                       Following Commands: Follows one step commands inconsistently       General Comments:  daughter answered most PLOF questions due to pt's cognition, pt attempting to follow commands however requires recueing and multimodal cues      General Comments      Exercises     Assessment/Plan    PT Assessment Patient needs continued PT services  PT Problem List Decreased strength;Decreased mobility;Decreased activity tolerance;Decreased balance;Decreased knowledge of use of DME;Decreased cognition;Pain       PT Treatment Interventions DME instruction;Gait training;Therapeutic exercise;Therapeutic activities;Functional mobility training;Patient/family education    PT Goals (Current goals can be found in the Care Plan section)  Acute Rehab PT Goals PT Goal Formulation: With patient/family Time For Goal Achievement: 04/02/17 Potential to Achieve Goals: Good    Frequency Min 4X/week   Barriers to discharge        Co-evaluation               AM-PAC PT "6 Clicks" Daily Activity  Outcome Measure Difficulty turning over in bed (including adjusting bedclothes, sheets and blankets)?: A Lot Difficulty moving from lying on back to sitting on the side of the bed? : A Lot Difficulty sitting down on and standing up from a chair with arms (e.g., wheelchair, bedside commode, etc,.)?: Total Help needed moving to and from a bed to chair (including a wheelchair)?: Total Help needed walking in hospital room?: Total Help needed climbing 3-5 steps with a railing? : Total 6 Click Score: 8    End of Session Equipment Utilized During Treatment: Gait belt Activity Tolerance: Patient tolerated treatment well Patient left: in bed;with call bell/phone within reach;with family/visitor present;with bed alarm set   PT Visit Diagnosis: Other abnormalities of gait and mobility (R26.89);Pain Pain - Right/Left: Left Pain - part of body: Hip    Time: 9357-0177 PT Time Calculation (min) (ACUTE ONLY): 21 min   Charges:   PT Evaluation $PT Eval Moderate Complexity: 1 Procedure     PT G  Codes:        Carmelia Bake, PT, DPT 03/19/2017 Pager: [phone]   York Ram E 03/19/2017, 11:57 AM

## 2017-03-19 NOTE — Progress Notes (Signed)
   Subjective:  Patient reports pain as mild to moderate.  No c/o.  Objective:   VITALS:   Vitals:   03/19/17 0427 03/19/17 0500 03/19/17 0600 03/19/17 0700  BP:  (!) 149/60 (!) 138/56 (!) 151/62  Pulse:  74 74 74  Resp:  18 (!) 21 19  Temp: 98.2 F (36.8 C)     TempSrc: Axillary     SpO2:  97% 99% 100%  Weight:      Height:        NAD ABD soft Dorsiflexion/Plantar flexion intact Incision: dressing C/D/I Compartment soft Left foot dressing intact   Lab Results  Component Value Date   WBC 10.4 03/19/2017   HGB 8.8 (L) 03/19/2017   HCT 28.2 (L) 03/19/2017   MCV 97.9 03/19/2017   PLT 133 (L) 03/19/2017   BMET    Component Value Date/Time   NA 140 03/19/2017 0345   NA 138 03/01/2017 1014   K 5.2 (H) 03/19/2017 0345   K 5.2 (H) 03/01/2017 1014   CL 109 03/19/2017 0345   CL 81 (L) 02/25/2013 1300   CO2 23 03/19/2017 0345   CO2 24 03/01/2017 1014   GLUCOSE 324 (H) 03/19/2017 0345   GLUCOSE 200 (H) 03/01/2017 1014   GLUCOSE 552 Repeated and Verified (HH) 02/25/2013 1300   BUN 26 (H) 03/19/2017 0345   BUN 22.4 03/01/2017 1014   CREATININE 1.35 (H) 03/19/2017 0345   CREATININE 1.3 (H) 03/01/2017 1014   CALCIUM 8.1 (L) 03/19/2017 0345   CALCIUM 9.3 03/01/2017 1014   GFRNONAA 40 (L) 03/19/2017 0345   GFRAA 46 (L) 03/19/2017 0345     Assessment/Plan: 1 Day Post-Op   Active Problems:   DM2 (diabetes mellitus, type 2) (HCC)   Type 2 diabetes, uncontrolled, with neuropathy (HCC)   RESTLESS LEGS SYNDROME   Diabetic polyneuropathy (HCC)   Malignant neoplasm of upper-outer quadrant of right breast in female, estrogen receptor positive (HCC)   Anemia   OSA (obstructive sleep apnea)   Thrombocytopenia, unspecified (HCC)   Essential hypertension   Hyperkalemia   Charcot ankle, left   Other cirrhosis of liver (HCC)   Hip fracture (HCC)   Prolonged QT interval   Closed comminuted intertrochanteric fracture of left femur (HCC)   TDWB LLE DVT ppx: lovenox x30  days, SCDs, TEDs PO pain control PT/OT Dipso: D/C planning, ok for dc when medically ready, f/u with podiatrist as planned   Daisy Larson 03/19/2017, 7:44 AM   Daisy Can, MD Cell [phone]

## 2017-03-19 NOTE — Clinical Social Work Placement (Signed)
   CLINICAL SOCIAL WORK PLACEMENT  NOTE  Date:  03/19/2017  Patient Details  Name: Daisy Larson MRN: [id number] Date of Birth: Apr 06, 1951  Clinical Social Work is seeking post-discharge placement for this patient at the [place] level of care (*CSW will initial, date and re-position this form in  chart as items are completed):  Yes   Patient/family provided with [place] Work Department's list of facilities offering this level of care within the geographic area requested by the patient (or if unable, by the patient's family).  Yes   Patient/family informed of their freedom to choose among providers that offer the needed level of care, that participate in Medicare, Medicaid or managed care program needed by the patient, have an available bed and are willing to accept the patient.  Yes   Patient/family informed of [place]'s ownership interest in [facility] and [facility], as well as of the fact that they are under no obligation to receive care at these facilities.  PASRR submitted to EDS on       PASRR number received on       Existing PASRR number confirmed on       FL2 transmitted to all facilities in geographic area requested by pt/family on       FL2 transmitted to all facilities within larger geographic area on       Patient informed that his/her managed care company has contracts with or will negotiate with certain facilities, including the following:            Patient/family informed of bed offers received.  Patient chooses bed at       Physician recommends and patient chooses bed at      Patient to be transferred to   on  .  Patient to be transferred to facility by       Patient family notified on   of transfer.  Name of family member notified:        PHYSICIAN       Additional Comment:    _______________________________________________ Luretha Rued, [place] 03/19/2017, 2:09 PM

## 2017-03-19 NOTE — Clinical Social Work Note (Signed)
Clinical Social Work Assessment  Patient Details  Name: Daisy Larson MRN: [id number] Date of Birth: [birth year]  Date of referral:  03/19/17               Reason for consult:  Discharge Planning                Permission sought to share information with:  Chartered certified accountant granted to share information::  Yes, Verbal Permission Granted  Name::        Agency::     Relationship::     Contact Information:     Housing/Transportation Living arrangements for the past 2 months:  Single Family Home Source of Information:  Adult Children, Spouse Patient Interpreter Needed:  None Criminal Activity/Legal Involvement Pertinent to Current Situation/Hospitalization:  No - Comment as needed Significant Relationships:  Adult Children, Spouse Lives with:  Spouse Do you feel safe going back to the place where you live?   (SNF recommended.) Need for family participation in patient care:  Yes (Comment)  Care giving concerns:  Pt may need more assistance than is available at home.   Social Worker assessment / plan:  Pt hospitalized on 03/16/17 from home with a hip fx. Surgery has been completed. CSW met with pt / family at bedside to review PT recommendations. Pt remained silent during CSW visit. Family reports pt is still " groggy " from medication. Daughter's are in agreement with PT recommendation for [facility] placement. Spouse is willing to consider this option but would prefer to take pt home at d/c. Family is [facility] ) which is not available at this time. SNF search has been initiated and bed offers are pending. CSW will continue to follow to assist with d/c planning needs. Employment status:  Retired Forensic scientist:  Medicare PT Recommendations:  [name] / Referral to community resources:  [place]  Patient/Family's Response to care:  Disposition to be determined.   Patient/Family's  Understanding of and Emotional Response to Diagnosis, Current Treatment, and Prognosis:  Family is aware SNF has been recommended. Daughter's are in agreement with plan but spouse is hesitant to accept placement. " I promised that I would never put her in a NH. " CSW provided education regarding nursing home placement  vs  ST Rehab. Support provided.  Emotional Assessment Appearance:  Appears stated age Attitude/Demeanor/Rapport:  Other (cooperative) Affect (typically observed):  Calm Orientation:  Oriented to Self, Oriented to Place, Oriented to Situation Alcohol / Substance use:  Not Applicable Psych involvement (Current and /or in the community):  No (Comment)  Discharge Needs  Concerns to be addressed:  Discharge Planning Concerns Readmission within the last 30 days:  No Current discharge risk:  None Barriers to Discharge:  No Barriers Identified   Daisy Larson, [place] 03/19/2017, 1:56 PM

## 2017-03-20 LAB — BASIC METABOLIC PANEL
ANION GAP: 4 — AB (ref 5–15)
BUN: 34 mg/dL — AB (ref 6–20)
CHLORIDE: 108 mmol/L (ref 101–111)
CO2: 28 mmol/L (ref 22–32)
Calcium: 8.4 mg/dL — ABNORMAL LOW (ref 8.9–10.3)
Creatinine, Ser: 1.14 mg/dL — ABNORMAL HIGH (ref 0.44–1.00)
GFR calc Af Amer: 57 mL/min — ABNORMAL LOW (ref >60)
GFR calc non Af Amer: 49 mL/min — ABNORMAL LOW (ref >60)
Glucose, Bld: 230 mg/dL — ABNORMAL HIGH (ref 65–99)
POTASSIUM: 4.1 mmol/L (ref 3.5–5.1)
SODIUM: 140 mmol/L (ref 135–145)

## 2017-03-20 LAB — CBC
HCT: 25.9 % — ABNORMAL LOW (ref 36.0–46.0)
HEMOGLOBIN: 8.3 g/dL — AB (ref 12.0–15.0)
MCH: 31.1 pg (ref 26.0–34.0)
MCHC: 32 g/dL (ref 30.0–36.0)
MCV: 97 fL (ref 78.0–100.0)
Platelets: 139 10*3/uL — ABNORMAL LOW (ref 150–400)
RBC: 2.67 MIL/uL — AB (ref 3.87–5.11)
RDW: 14.9 % (ref 11.5–15.5)
WBC: 10.3 10*3/uL (ref 4.0–10.5)

## 2017-03-20 LAB — GLUCOSE, CAPILLARY
GLUCOSE-CAPILLARY: 256 mg/dL — AB (ref 65–99)
GLUCOSE-CAPILLARY: 264 mg/dL — AB (ref 65–99)
GLUCOSE-CAPILLARY: 321 mg/dL — AB (ref 65–99)
Glucose-Capillary: 207 mg/dL — ABNORMAL HIGH (ref 65–99)
Glucose-Capillary: 211 mg/dL — ABNORMAL HIGH (ref 65–99)
Glucose-Capillary: 240 mg/dL — ABNORMAL HIGH (ref 65–99)
Glucose-Capillary: 266 mg/dL — ABNORMAL HIGH (ref 65–99)
Glucose-Capillary: 273 mg/dL — ABNORMAL HIGH (ref 65–99)

## 2017-03-20 NOTE — Anesthesia Postprocedure Evaluation (Signed)
Anesthesia Post Note  Patient: Daisy Larson  Procedure(s) Performed: Procedure(s) (LRB): INTRAMEDULLARY (IM) NAIL INTERTROCHANTRIC (Left)  Patient location during evaluation: PACU Anesthesia Type: General Level of consciousness: awake and alert Pain management: pain level controlled Vital Signs Assessment: post-procedure vital signs reviewed and stable Respiratory status: spontaneous breathing, nonlabored ventilation, respiratory function stable and patient connected to nasal cannula oxygen Cardiovascular status: blood pressure returned to baseline and stable Postop Assessment: no signs of nausea or vomiting Anesthetic complications: no        Last Vitals:  Vitals:   03/20/17 1000 03/20/17 1344  BP: (!) 121/52 (!) 127/50  Pulse: 88 75  Resp: 18 18  Temp: 36.8 C 36.4 C    Last Pain:  Vitals:   03/20/17 1344  TempSrc: Oral  PainSc:    Pain Goal: Patients Stated Pain Goal: 0 (03/20/17 1000)               Catalina Gravel

## 2017-03-20 NOTE — Care Management Important Message (Signed)
Important Message  Patient Details  Name: Daisy Larson MRN: [id number] Date of Birth: 25-Jan-1951   Medicare Important Message Given:  Yes    Kerin Salen 03/20/2017, 10:57 AMImportant Message  Patient Details  Name: Daisy Larson MRN: [id number] Date of Birth: [birth year]   Medicare Important Message Given:  Yes    Kerin Salen 03/20/2017, 10:56 AM

## 2017-03-20 NOTE — Progress Notes (Signed)
PROGRESS NOTE  Daisy Larson KGU:[id number] DOB: [birth year] DOA: 03/16/2017 PCP: [name], MD   LOS: 4 days   Brief Narrative: 66 year old female with history of invasive carcinoma, Charcot foot and diabetic arthropathy status post several surgical interventions, complicated by intra-articular infection status post intra-articular bead placement about 3 weeks ago, currently on Bactrim.  She also has a history of nonalcoholic cirrhosis for which she is followed by hepatology at [facility].  She comes to the hospital status post mechanical fall with a hip fracture. Patient was initially admitted to the floor, on the morning after admission she was noted to be very lethargic, confused, on ABG at that time showed respiratory acidosis with hypercarbia and she was transferred to stepdown for BiPAP.  Her surgery had to be delayed because of respiratory failure.  She eventually went operative repair the 5/20, was monitored in stepdown for the next 24 hours, and was transferred to the floor on 5/21.  She is slowly improving.  Assessment & Plan: Active Problems:   DM2 (diabetes mellitus, type 2) (HCC)   Type 2 diabetes, uncontrolled, with neuropathy (HCC)   RESTLESS LEGS SYNDROME   Diabetic polyneuropathy (HCC)   Malignant neoplasm of upper-outer quadrant of right breast in female, estrogen receptor positive (HCC)   Anemia   OSA (obstructive sleep apnea)   Thrombocytopenia, unspecified (HCC)   Essential hypertension   Hyperkalemia   Charcot ankle, left   Other cirrhosis of liver (HCC)   Hip fracture (HCC)   Prolonged QT interval   Closed comminuted intertrochanteric fracture of left femur (HCC)   Hip fracture -Orthopedic surgery consulted, appreciate input, patient was taken to the operating room on 5/20 and she underwent intramedullary fixation of the left femur by Dr. [name] -PT recommends SNF level of care, social worker consulted, may be able to be discharged on  5/23  Acute blood loss anemia -Expected postop, there is a component of chronic anemia from her liver disease, hemoglobin with slight down trend 8.8 >> 8.3, continue to monitor  Liver cirrhosis -Nonalcoholic -Ammonia level borderline elevated, continue home lactulose as well as rifaximin   Acute encephalopathy -Likely multifactorial, component of hepatic encephalopathy as well as hypercarbic respiratory failure.  She received lactulose as well as rifaximin, and BiPAP for her respiratory failure, her mental status has improved and this morning finally she is back to baseline.  This is confirmed by her husband.  Respiratory acidosis with acute hypercarbic respiratory failure -s/p BiPAP, now improved, most recent ABG reassuring, continue nightly CPAP  Hyperkalemia -Likely due to the fact that she was on Bactrim as well as and spironolactone.  Received Kayexalate 2, potassium within normal limits this morning at 4.1.  Repeat tomorrow morning.  Obstructive sleep apnea -Ensure that CPAP is on tonight as she is very sensitive and easily becomes hypercarbic also due to probable obesity hypoventilation  Thrombocytopenia -Likely due to liver cirrhosis currently platelets above 100K, stable, continue to monitor  Left foot wounds -This is postsurgery (s/p multiple surgeries at [facility], with presumed hardware infection s/p intraarticular antibiotics beads) -wound care consulted, continue Betadine as per family recommendations as they have been doing at home -She was on Bactrim prior to coming to the hospital, this was supposed to be done on 5/21, I changed her to clindamycin, can probably stop antibiotics on discharge and she will need to follow-up with her primary surgeon at [place] kidney injury -?  In the setting of Bactrim, improving -Hold Bactrim for  now, changed to clindamycin -Creatinine is improving, 1.14 this morning  Restless leg syndrome -Continue home medications  Diabetes  mellitus and diabetic neuropathy -Continue sliding scale, continue Neurontin, CBGs the lower 200s    DVT prophylaxis: SCDs Code Status: Full code Family Communication: Discussed with patient's husband at bedside Disposition Plan: SNF when ready  Consultants:   Orthopedic surgery  Procedures:   BiPAP  Antimicrobials:  Bactrim before admission >> 5/19  Clindamycin 5/19>>  Subjective: -On room air this morning, no complaints, alert and oriented 4, denies any chest pain shortness of breath.  Objective: Vitals:   03/19/17 1500 03/19/17 1600 03/19/17 1743 03/20/17 0425  BP: (!) 158/72  (!) 124/53 (!) 159/68  Pulse:    73  Resp: 20  18 18   Temp:  98.9 F (37.2 C)  98 F (36.7 C)  TempSrc:  Oral  Oral  SpO2:    97%  Weight:      Height:        Intake/Output Summary (Last 24 hours) at 03/20/17 1022 Last data filed at 03/20/17 0704  Gross per 24 hour  Intake              100 ml  Output             1000 ml  Net             -900 ml   Filed Weights   03/16/17 1725 03/16/17 1727 03/16/17 2306  Weight: 111.1 kg (245 lb) 111.1 kg (245 lb) 111.1 kg (245 lb)    Examination:  Vitals:   03/19/17 1500 03/19/17 1600 03/19/17 1743 03/20/17 0425  BP: (!) 158/72  (!) 124/53 (!) 159/68  Pulse:    73  Resp: 20  18 18   Temp:  98.9 F (37.2 C)  98 F (36.7 C)  TempSrc:  Oral  Oral  SpO2:    97%  Weight:      Height:       Constitutional: NAD, calm, comfortable Eyes: PERRL, lids and conjunctivae normal Respiratory: clear to auscultation bilaterally, no wheezing, no crackles. Normal respiratory effort.  Cardiovascular: Regular rate and rhythm, no murmurs / rubs / gallops. No LE edema. 2+ pedal pulses.  Abdomen: no tenderness. Bowel sounds positive.  Neurologic: non focal  Data Reviewed: I have personally reviewed following labs and imaging studies  CBC:  Recent Labs Lab 03/16/17 1903 03/17/17 0649 03/18/17 0345 03/19/17 0345 03/20/17 0517  WBC 5.0 8.3 7.8 10.4  10.3  NEUTROABS 3.5  --   --   --   --   HGB 8.7* 10.1* 10.0* 8.8* 8.3*  HCT 26.8* 31.6* 32.0* 28.2* 25.9*  MCV 97.8 98.4 98.8 97.9 97.0  PLT 112* 125* 110* 133* 673*   Basic Metabolic Panel:  Recent Labs Lab 03/16/17 1903 03/17/17 0649 03/18/17 0345 03/19/17 0345 03/20/17 0517  NA 140 137 137 140 140  K 5.2* 5.4* 5.7* 5.2* 4.1  CL 106 103 105 109 108  CO2 26 27 25 23 28   GLUCOSE 235* 140* 150* 324* 230*  BUN 19 17 18  26* 34*  CREATININE 1.63* 1.35* 1.27* 1.35* 1.14*  CALCIUM 8.9 8.5* 8.3* 8.1* 8.4*   GFR: Estimated Creatinine Clearance: 63.5 mL/min (A) (by C-G formula based on SCr of 1.14 mg/dL (H)). Liver Function Tests:  Recent Labs Lab 03/16/17 1903 03/17/17 0649 03/18/17 0345 03/19/17 0345  AST 52*  --  44* 39  ALT 28  --  27 20  ALKPHOS 162*  --  144* 116  BILITOT 0.6  --  1.6* 1.2  PROT 6.6  --  6.8 6.3*  ALBUMIN 2.6* 2.7* 2.7* 2.3*   No results for input(s): LIPASE, AMYLASE in the last 168 hours.  Recent Labs Lab 03/17/17 0920 03/18/17 1953  AMMONIA 49* 38*   Coagulation Profile:  Recent Labs Lab 03/16/17 1903 03/18/17 0345  INR 1.11 1.27   Cardiac Enzymes: No results for input(s): CKTOTAL, CKMB, CKMBINDEX, TROPONINI in the last 168 hours. BNP (last 3 results) No results for input(s): PROBNP in the last 8760 hours. HbA1C: No results for input(s): HGBA1C in the last 72 hours. CBG:  Recent Labs Lab 03/19/17 1545 03/19/17 2018 03/20/17 0037 03/20/17 0532 03/20/17 0744  GLUCAP 328* 321* 266* 207* 211*   Lipid Profile: No results for input(s): CHOL, HDL, LDLCALC, TRIG, CHOLHDL, LDLDIRECT in the last 72 hours. Thyroid Function Tests: No results for input(s): TSH, T4TOTAL, FREET4, T3FREE, THYROIDAB in the last 72 hours. Anemia Panel: No results for input(s): VITAMINB12, FOLATE, FERRITIN, TIBC, IRON, RETICCTPCT in the last 72 hours. Urine analysis:    Component Value Date/Time   COLORURINE YELLOW 03/16/2017 2348   APPEARANCEUR CLEAR  03/16/2017 2348   LABSPEC 1.011 03/16/2017 2348   PHURINE 6.0 03/16/2017 2348   GLUCOSEU NEGATIVE 03/16/2017 2348   GLUCOSEU NEGATIVE 09/14/2015 0808   HGBUR NEGATIVE 03/16/2017 2348   BILIRUBINUR NEGATIVE 03/16/2017 2348   KETONESUR NEGATIVE 03/16/2017 2348   PROTEINUR NEGATIVE 03/16/2017 2348   UROBILINOGEN 1.0 09/14/2015 0808   NITRITE NEGATIVE 03/16/2017 2348   LEUKOCYTESUR NEGATIVE 03/16/2017 2348   Sepsis Labs: Invalid input(s): PROCALCITONIN, LACTICIDVEN  Recent Results (from the past 240 hour(s))  Surgical pcr screen     Status: None   Collection Time: 03/17/17 10:18 AM  Result Value Ref Range Status   MRSA, PCR NEGATIVE NEGATIVE Final   Staphylococcus aureus NEGATIVE NEGATIVE Final    Comment:        The Xpert SA Assay (FDA approved for NASAL specimens in patients over 52 years of age), is one component of a comprehensive surveillance program.  Test performance has been validated by [facility] for patients greater than or equal to 105 year old. It is not intended to diagnose infection nor to guide or monitor treatment.       Radiology Studies: Pelvis Portable  Result Date: 03/18/2017 CLINICAL DATA:  Internal fixation left hip fracture EXAM: PORTABLE PELVIS 1-2 VIEWS COMPARISON:  03/16/2017 FINDINGS: Changes of internal fixation across the left femoral intertrochanteric fracture. Moderate displacement of the lesser trochanter noted. Otherwise near anatomic alignment. No hardware complicating features. IMPRESSION: Internal fixation across the left femoral intertrochanteric fracture with displaced lesser trochanter. Electronically Signed   By: Rolm Baptise M.D.   On: 03/18/2017 11:27   Dg Femur Port Min 2 Views Left  Result Date: 03/18/2017 CLINICAL DATA:  Internal fixation EXAM: LEFT FEMUR PORTABLE 2 VIEWS COMPARISON:  03/16/2017 FINDINGS: Intramedullary nail and dynamic hip screw noted across the right femoral intertrochanteric fracture. Displacement of the lesser  trochanter since. Otherwise near anatomic alignment. No hardware complicating feature. IMPRESSION: Internal fixation across the left femoral intertrochanteric fracture with displaced lesser trochanter. Electronically Signed   By: Rolm Baptise M.D.   On: 03/18/2017 11:27     Scheduled Meds: . buPROPion  300 mg Oral Daily  . DULoxetine  60 mg Oral Daily  . enoxaparin (LOVENOX) injection  40 mg Subcutaneous Q24H  . gabapentin  800 mg Oral TID  . insulin aspart  0-9  Units Subcutaneous Q4H  . insulin glargine  65 Units Subcutaneous QHS  . lactulose  30 g Oral TID  . loratadine  10 mg Oral Daily  . polyethylene glycol  17 g Oral TID  . pramipexole  2 mg Oral QHS  . propranolol ER  60 mg Oral Daily  . rifaximin  550 mg Oral BID   Continuous Infusions: . clindamycin (CLEOCIN) IV Stopped (03/20/17 0600)    Marzetta Board, MD, PhD [facility] Pager [phone]  If 7PM-7AM, please contact night-coverage [email or web address] Password [facility] 03/20/2017, 10:22 AM

## 2017-03-20 NOTE — Clinical Social Work Placement (Signed)
Patient received and accepted bed offer at [facility]. Patient requested PTAR for transport when ready to discharge to SNF.  CLINICAL SOCIAL WORK PLACEMENT  NOTE  Date:  03/20/2017  Patient Details  Name: Daisy Larson MRN: [id number] Date of Birth: [birth year]  Clinical Social Work is seeking post-discharge placement for this patient at the [place] level of care (*CSW will initial, date and re-position this form in  chart as items are completed):  Yes   Patient/family provided with [place] Work Department's list of facilities offering this level of care within the geographic area requested by the patient (or if unable, by the patient's family).  Yes   Patient/family informed of their freedom to choose among providers that offer the needed level of care, that participate in Medicare, Medicaid or managed care program needed by the patient, have an available bed and are willing to accept the patient.  Yes   Patient/family informed of [place]'s ownership interest in [facility] and [facility], as well as of the fact that they are under no obligation to receive care at these facilities.  PASRR submitted to EDS on 03/19/17     PASRR number received on 03/20/17     Existing PASRR number confirmed on       FL2 transmitted to all facilities in geographic area requested by pt/family on 03/19/17     FL2 transmitted to all facilities within larger geographic area on 03/19/17     Patient informed that his/her managed care company has contracts with or will negotiate with certain facilities, including the following:        Yes   Patient/family informed of bed offers received.  Patient chooses bed at [place], [place]     Physician recommends and patient chooses bed at      Patient to be transferred to   on  .  Patient to be transferred to facility by       Patient family notified on   of transfer.  Name of family member  notified:        PHYSICIAN       Additional Comment:    _______________________________________________ Burnis Medin, LCSW 03/20/2017, 4:21 PM

## 2017-03-20 NOTE — Progress Notes (Signed)
Inpatient Diabetes Program Recommendations  AACE/ADA: New Consensus Statement on Inpatient Glycemic Control (2015)  Target Ranges:  Prepandial:   less than 140 mg/dL      Peak postprandial:   less than 180 mg/dL (1-2 hours)      Critically ill patients:  140 - 180 mg/dL   Results for BERLENE, DIXSON (MRN [id number]) as of 03/20/2017 10:37  Ref. Range 03/19/2017 07:56 03/19/2017 11:44 03/19/2017 15:45 03/19/2017 20:18 03/20/2017 00:37 03/20/2017 05:32 03/20/2017 07:44  Glucose-Capillary Latest Ref Range: 65 - 99 mg/dL 299 (H) 338 (H) 328 (H) 321 (H) 266 (H) 207 (H) 211 (H)   Review of Glycemic Control  Diabetes history: DM 2 Current orders for Inpatient glycemic control: Lantus 65 units, Novolog Sensitive Q4hours  Inpatient Diabetes Program Recommendations:    Glucose in the high 200-300 range. Glucose increases after meal times into the 300's.  Consider increasing Lantus to 38 units BID (total 75 units daily)  Consider Novolog 5 units tid Meal coverage if at least 50% of meals are consumed.  Thanks,  Tama Headings RN, MSN, [facility] Inpatient Diabetes Coordinator Team Pager [phone] (8a-5p)

## 2017-03-21 DIAGNOSIS — G4733 Obstructive sleep apnea (adult) (pediatric): Secondary | ICD-10-CM

## 2017-03-21 DIAGNOSIS — M14672 Charcot's joint, left ankle and foot: Secondary | ICD-10-CM

## 2017-03-21 DIAGNOSIS — S72092A Other fracture of head and neck of left femur, initial encounter for closed fracture: Secondary | ICD-10-CM

## 2017-03-21 DIAGNOSIS — E1122 Type 2 diabetes mellitus with diabetic chronic kidney disease: Secondary | ICD-10-CM

## 2017-03-21 DIAGNOSIS — E1142 Type 2 diabetes mellitus with diabetic polyneuropathy: Secondary | ICD-10-CM

## 2017-03-21 DIAGNOSIS — N179 Acute kidney failure, unspecified: Secondary | ICD-10-CM

## 2017-03-21 DIAGNOSIS — C50411 Malignant neoplasm of upper-outer quadrant of right female breast: Secondary | ICD-10-CM

## 2017-03-21 DIAGNOSIS — I10 Essential (primary) hypertension: Secondary | ICD-10-CM

## 2017-03-21 DIAGNOSIS — Z794 Long term (current) use of insulin: Secondary | ICD-10-CM

## 2017-03-21 DIAGNOSIS — E1140 Type 2 diabetes mellitus with diabetic neuropathy, unspecified: Secondary | ICD-10-CM

## 2017-03-21 DIAGNOSIS — Z170 Estrogen receptor positive status [ER+]: Secondary | ICD-10-CM

## 2017-03-21 DIAGNOSIS — D696 Thrombocytopenia, unspecified: Secondary | ICD-10-CM

## 2017-03-21 DIAGNOSIS — N183 Chronic kidney disease, stage 3 (moderate): Secondary | ICD-10-CM

## 2017-03-21 DIAGNOSIS — E1165 Type 2 diabetes mellitus with hyperglycemia: Secondary | ICD-10-CM

## 2017-03-21 DIAGNOSIS — K7469 Other cirrhosis of liver: Secondary | ICD-10-CM

## 2017-03-21 DIAGNOSIS — D631 Anemia in chronic kidney disease: Secondary | ICD-10-CM

## 2017-03-21 LAB — COMPREHENSIVE METABOLIC PANEL
ALBUMIN: 2.3 g/dL — AB (ref 3.5–5.0)
ALT: 20 U/L (ref 14–54)
AST: 41 U/L (ref 15–41)
Alkaline Phosphatase: 111 U/L (ref 38–126)
Anion gap: 5 (ref 5–15)
BUN: 30 mg/dL — AB (ref 6–20)
CHLORIDE: 105 mmol/L (ref 101–111)
CO2: 26 mmol/L (ref 22–32)
CREATININE: 1.11 mg/dL — AB (ref 0.44–1.00)
Calcium: 8.3 mg/dL — ABNORMAL LOW (ref 8.9–10.3)
GFR calc Af Amer: 59 mL/min — ABNORMAL LOW (ref >60)
GFR, EST NON AFRICAN AMERICAN: 51 mL/min — AB (ref >60)
GLUCOSE: 207 mg/dL — AB (ref 65–99)
Potassium: 4.4 mmol/L (ref 3.5–5.1)
Sodium: 136 mmol/L (ref 135–145)
Total Bilirubin: 1.6 mg/dL — ABNORMAL HIGH (ref 0.3–1.2)
Total Protein: 6 g/dL — ABNORMAL LOW (ref 6.5–8.1)

## 2017-03-21 LAB — CBC
HCT: 25.3 % — ABNORMAL LOW (ref 36.0–46.0)
Hemoglobin: 8.3 g/dL — ABNORMAL LOW (ref 12.0–15.0)
MCH: 32.2 pg (ref 26.0–34.0)
MCHC: 32.8 g/dL (ref 30.0–36.0)
MCV: 98.1 fL (ref 78.0–100.0)
PLATELETS: 101 10*3/uL — AB (ref 150–400)
RBC: 2.58 MIL/uL — AB (ref 3.87–5.11)
RDW: 15 % (ref 11.5–15.5)
WBC: 8 10*3/uL (ref 4.0–10.5)

## 2017-03-21 LAB — GLUCOSE, CAPILLARY
GLUCOSE-CAPILLARY: 183 mg/dL — AB (ref 65–99)
Glucose-Capillary: 219 mg/dL — ABNORMAL HIGH (ref 65–99)

## 2017-03-21 MED ORDER — INSULIN GLARGINE 100 UNIT/ML ~~LOC~~ SOLN
38.0000 [IU] | Freq: Two times a day (BID) | SUBCUTANEOUS | Status: DC
Start: 2017-03-21 — End: 2017-03-21
  Administered 2017-03-21: 38 [IU] via SUBCUTANEOUS
  Filled 2017-03-21 (×2): qty 0.38

## 2017-03-21 MED ORDER — TRAMADOL HCL 50 MG PO TABS: 50.0000 mg | ORAL_TABLET | Freq: Four times a day (QID) | ORAL | 0 refills | Status: DC | PRN

## 2017-03-21 NOTE — Care Management Note (Signed)
Case Management Note  Patient Details  Name: Daisy Larson MRN: [id number] Date of Birth: [birth year]  Subjective/Objective:  CSW already following for d/c SNF-[facility].                  Action/Plan:D/C SNF.   Expected Discharge Date:   (unknown)               Expected Discharge Plan:  Skilled Nursing Facility  In-House Referral:  Clinical Social Work  Discharge planning Services  CM Consult  Post Acute Care Choice:    Choice offered to:     DME Arranged:    DME Agency:     HH Arranged:    [place] Agency:     Status of Service:  Completed, signed off  If discussed at [employer], dates discussed:    Additional Comments:  Dessa Phi, RN 03/21/2017, 12:28 PM

## 2017-03-21 NOTE — Clinical Social Work Placement (Signed)
Patient received and accepted bed offer at [facility].PTAR contacted, family aware. Packet complete. Patient's RN can call report to [phone], patient going to room 402.  CLINICAL SOCIAL WORK PLACEMENT  NOTE  Date:  03/21/2017  Patient Details  Name: Daisy Larson MRN: [id number] Date of Birth: 09/10/1951  Clinical Social Work is seeking post-discharge placement for this patient at the [place] level of care (*CSW will initial, date and re-position this form in  chart as items are completed):  Yes   Patient/family provided with [place] Work Department's list of facilities offering this level of care within the geographic area requested by the patient (or if unable, by the patient's family).  Yes   Patient/family informed of their freedom to choose among providers that offer the needed level of care, that participate in Medicare, Medicaid or managed care program needed by the patient, have an available bed and are willing to accept the patient.  Yes   Patient/family informed of [place]'s ownership interest in [facility] and [facility], as well as of the fact that they are under no obligation to receive care at these facilities.  PASRR submitted to EDS on 03/19/17     PASRR number received on 03/20/17     Existing PASRR number confirmed on       FL2 transmitted to all facilities in geographic area requested by pt/family on 03/19/17     FL2 transmitted to all facilities within larger geographic area on 03/19/17     Patient informed that his/her managed care company has contracts with or will negotiate with certain facilities, including the following:        Yes   Patient/family informed of bed offers received.  Patient chooses bed at [place], [place]     Physician recommends and patient chooses bed at      Patient to be transferred to [place], [place] on 03/21/17.  Patient to be transferred to facility by  PTAR     Patient family notified on 03/21/17 of transfer.  Name of family member notified:  Hinda Lenis     PHYSICIAN       Additional Comment:    _______________________________________________ Burnis Medin, LCSW 03/21/2017, 2:01 PM

## 2017-03-21 NOTE — Discharge Summary (Addendum)
Physician Discharge Summary  Daisy Larson BTD:[id number] DOB: 1950-12-19 DOA: 03/16/2017  PCP: [name], MD  Admit date: 03/16/2017 Discharge date: 03/21/2017  Admitted From: Home Disposition:  SNF  Recommendations for Outpatient Follow-up:  1. Follow up with PCP in 1-2 weeks 2. Follow up with Dr. [name] in Orthopedic Surgery as an outpatient  3. Follow up with Dr. [name] for Left Foot Charcot and Wound 4. Follow up with Hepatology at [facility] 5. Follow up with Dr. Jana Hakim as an outpatient 6. Please obtain CMP/CBC in AM and within 1 week 7. Repeat EKG in AM to check QTc  Home Health: No Equipment/Devices: None  Discharge Condition: Stable CODE STATUS: FULL CODE Diet recommendation: Heart Healthy / Carb Modified  Brief/Interim Summary: The Patient is a 66 year old female with history of malignant neoplasm of upper-outer quadrant of right breast in female, Charcot foot and diabetic arthropathy status post several surgical interventions, complicated by intra-articular infection status post intra-articular bead placement about 3 weeks ago who was on Bactrim , nonalcoholic cirrhosis followed at [facility] was admitted after a fall sustaining Comminuted Left intertrochanteric femur fracture now s/p L IM nail. Patient was initially admitted to the floor, on the morning after admission she was noted to be very lethargic, confused, on ABG at that time showed respiratory acidosis with hypercarbia and she was transferred to stepdown for BiPAP.  Her surgery had to be delayed because of respiratory failure.  She eventually went operative repair the 5/20, was monitored in stepdown for the next 24 hours, and was transferred to the floor on 5/21.  She is improved and at this time deemed stable to be transferred to SNF. She will need to follow up with Orthopedics, Dr. [name], and [facility] Hepatology at D/C.   Discharge Diagnoses:  Active Problems:   DM2 (diabetes  mellitus, type 2) (HCC)   Type 2 diabetes, uncontrolled, with neuropathy (HCC)   RESTLESS LEGS SYNDROME   Diabetic polyneuropathy (HCC)   Malignant neoplasm of upper-outer quadrant of right breast in female, estrogen receptor positive (HCC)   Anemia   OSA (obstructive sleep apnea)   Thrombocytopenia, unspecified (HCC)   Essential hypertension   Hyperkalemia   Charcot ankle, left   Other cirrhosis of liver (HCC)   Hip fracture (HCC)   Prolonged QT interval   Closed comminuted intertrochanteric fracture of left femur (HCC)  Communited Left Intertrochanteric Femur fracture s/p Intramedullary Fixation POD 3  -Orthopedic surgery consulted, appreciate input, patient was taken to the operating room on 5/20 and she underwent intramedullary fixation of the left femur by Dr. [name] -PT recommends SNF level of care, social worker consulted -Prophylaxis with Lovenox per Orthopedic and Pain Control scripts written by Orthopedics -Follow up with Orthopedic as an outpatient and repeat CBC in AM  Acute blood loss Anemia - -Expected postop, there is a component of chronic anemia from her liver disease and IDA, hemoglobin with slight down trend 8.8 >> 8.3 (Stable from yesterday as was 8.3/25.9 today) -Continue to monitor at SNF and repeat CBC in AM and also within 1 week  Liver cirrhosis  -Nonalcoholic -Ammonia level borderline elevated,  -continue home lactulose as well as rifaximin -C/w Home Propanolol 60 mg qHS -Ok to Restart Diuretics with Spironolactone and Furosemide at D/C but will need Renal Function closely monitored and repeat CMP in AM  Acute encephalopathy  -Likely multifactorial, component of hepatic encephalopathy as well as hypercarbic respiratory failure.   -She received lactulose as well as rifaximin,  and BiPAP for her respiratory failure, -Her mental status has improved and this morning finally she is back to baseline.  This is confirmed by her husband.  Respiratory  acidosis with acute hypercarbic respiratory failure - -s/p BiPAP, now improved, most recent ABG was reassuring,  -continue nightly CPAP  Hyperkalemia  -Likely due to the fact that she was on Bactrim as well as and spironolactone.   -Received Kayexalate 2, potassium within normal limits this morning at 4.4.  -Spironolactone restarted at D/C and will need Renal Fxn checked in AM  Obstructive sleep apnea  -Ensure that CPAP is on tonight as she is very sensitive and easily becomes hypercarbic also due to probable obesity hypoventilation  Thrombocytopenia  -Likely due to liver cirrhosis currently platelets above 100K -Will need CBC in AM to monitor as patient is being D/C'd on Lovenox by Orthopedics for DVT prophylaxis  Left foot wounds/Left Foot Charcot -This is postsurgery (s/p multiple surgeries at [facility], with presumed hardware infection s/p intraarticular antibiotics beads) -Wound care consulted, continue Betadine as per family recommendations as they have been doing at home -She was on Bactrim prior to coming to the hospital, this was supposed to be complete on 5/21; She was changed her to clindamycin and will stop antibiotics on discharge and she will need to follow-up with her primary surgeon at [facility] Dr. [name]  Acute kidney injury, improving -?  In the setting of Bactrim, improving -Stopped Bactrim for now, changed to Clindamycin and will D/C at D/C -Ok to Resume Lisinopril, Spironolactone, and Furosemide  -Creatinine is improving, 1.11 this morning -Will need Repeat CMP in AM  Restless leg syndrome -Continue Home medications with Pramipexole 2 mg po Daily qHS  Diabetes mellitus and diabetic neuropathy -Resume Home Medications with Novolog 70/30 with 100 units in AM and 70 units in the evening  -Continue with Gagbapentin and Duloxetine for Neuropathy -Appreciated Diabetic Coordinator Assistance while Hospitalized  -Follow up with PCP as an outpatient   Hx of Breast  Cancer with ER/PR Positive, HER-2 Neg -S/p Right Breast Lumpectomy -Fo,llow up with Dr. Jana Hakim as an outpatient  Depression/Anxiety  -C/w with Citalopram and Duloxetine and Bupropion   Discharge Instructions   Allergies as of 03/21/2017      Reactions   Other Rash   Blisters Per Patient - she has been told she can not have general anesthesia "unless life-threatening"   Tape    Oxymorphone    lethargic   Barbiturates Dermatitis, Other (See Comments)   Drowsiness, patient states that Barbiturates & Narcotics make her extremely sleepy and drowsy 24 Apr 2013 Drowsiness  "sleeps forever"      Medication List    STOP taking these medications   sulfamethoxazole-trimethoprim 800-160 MG tablet Commonly known as:  BACTRIM DS,SEPTRA DS     TAKE these medications   AMBULATORY NON FORMULARY MEDICATION Medication Name: Nitroglycerin ointment 0.125% Apply pea-size amount internally 4 times daily   aspirin 81 MG tablet Take 81 mg by mouth daily.   b complex vitamins capsule Take 1 capsule by mouth daily.   BD INSULIN SYRINGE ULTRAFINE 31G X 5/16" 0.3 ML Misc Generic drug:  Insulin Syringe-Needle U-100 USE  TO INJECT  INSULIN THREE TIMES DAILY   buPROPion 300 MG 24 hr tablet Commonly known as:  WELLBUTRIN XL Take 1 tablet by mouth Daily.   calcium carbonate 600 MG Tabs tablet Commonly known as:  OS-CAL Take 1,200 mg by mouth.   Cinnamon 500 MG capsule Take 500 mg by  mouth daily.   citalopram 20 MG tablet Commonly known as:  CELEXA Take 1 tablet by mouth daily.   DULoxetine 60 MG capsule Commonly known as:  CYMBALTA Take 60 mg by mouth daily.   enoxaparin 40 MG/0.4ML injection Commonly known as:  LOVENOX Inject 0.4 mLs (40 mg total) into the skin daily.   EQL OMEGA 3 FISH OIL 1400 MG Caps Take 1 capsule by mouth daily.   fluticasone 50 MCG/ACT nasal spray Commonly known as:  FLONASE Frequency:PRN   Dosage:50   MCG  Instructions:  Note:Dose: 50 MCG    furosemide 40 MG tablet Commonly known as:  LASIX Take 60 mg by mouth daily.   gabapentin 800 MG tablet Commonly known as:  NEURONTIN 800 mg 3 (three) times daily.   glucagon 1 MG injection Commonly known as:  GLUCAGON EMERGENCY Inject 1 mg into the vein once as needed.   glucose blood test strip Use as instructed to check blood sugar 4 times per day Dx: E11.65   Grape Seed Extract 50 MG Caps Take by mouth daily.   HYDROcodone-acetaminophen 5-325 MG tablet Commonly known as:  NORCO/VICODIN Take 1-2 tablets by mouth every 6 (six) hours as needed for moderate pain.   insulin aspart protamine- aspart (70-30) 100 UNIT/ML injection Commonly known as:  NOVOLOG MIX 70/30 Inject 70-100 Units into the skin 2 (two) times daily with a meal. 100 units in AM and 70 units at night   insulin regular human CONCENTRATED 500 UNIT/ML injection Commonly known as:  HUMULIN R Use three times daily with meals per sliding scale. Max dose 80 units per day. This is equivalent to 400 units of U-100 insulin per day.   lactulose 20 g packet Commonly known as:  CEPHULAC Take 30 g by mouth 3 (three) times daily.   lisinopril 20 MG tablet Commonly known as:  PRINIVIL,ZESTRIL Take 20 mg by mouth every morning. Take in the morning   loratadine 10 MG tablet Commonly known as:  CLARITIN Take 10 mg by mouth daily.   Magnesium Oxide 400 MG Caps Take 400 mg by mouth daily.   meclizine 12.5 MG tablet Commonly known as:  ANTIVERT Take 12.5 mg by mouth 3 (three) times daily as needed.   Milk Thistle 500 MG Caps Take 1,000 mg by mouth daily.   MULTIPLE VITAMIN PO Take 1 tablet by mouth daily.   ONETOUCH DELICA LANCETS 78E Misc Use to test sugars 4 x daily.   pantoprazole 40 MG tablet Commonly known as:  PROTONIX Take 40 mg by mouth at bedtime.   polyethylene glycol packet Commonly known as:  MIRALAX / GLYCOLAX Take 17 g by mouth 3 (three) times daily.   pramipexole 1 MG tablet Commonly  known as:  MIRAPEX Take 2 mg by mouth at bedtime.   propranolol ER 60 MG 24 hr capsule Commonly known as:  INDERAL LA Take 60 mg by mouth daily. Take at night   spironolactone 50 MG tablet Commonly known as:  ALDACTONE Take 75 mg by mouth daily.   traMADol 50 MG tablet Commonly known as:  ULTRAM Take 1 tablet (50 mg total) by mouth every 6 (six) hours as needed for moderate pain or severe pain.   XIFAXAN 550 MG Tabs tablet Generic drug:  rifaximin Take 1 tablet by mouth 2 (two) times daily.       Contact information for follow-up providers    Swinteck, Aaron Edelman, MD. Schedule an appointment as soon as possible for a visit in  2 weeks.   Specialty:  Orthopedic Surgery Why:  left hip Contact information: [address] [address] [phone]            Contact information for after-discharge care    Destination    [facility] .   Specialty:  Skilled Nursing Facility Contact information: [place] [phone]                 Allergies  Allergen Reactions  . Other Rash    Blisters Per Patient - she has been told she can not have general anesthesia "unless life-threatening"  . Tape   . Oxymorphone     lethargic  . Barbiturates Dermatitis and Other (See Comments)    Drowsiness, patient states that Barbiturates & Narcotics make her extremely sleepy and drowsy 24 Apr 2013 Drowsiness  "sleeps forever"    Consultations:  Orthopedics Dr. [name]  Procedures/Studies: Dg Chest 1 View  Result Date: 03/16/2017 CLINICAL DATA:  Preop for hip fracture. EXAM: CHEST 1 VIEW COMPARISON:  08/26/2015 FINDINGS: Borderline cardiomegaly. Chronic left sixth and seventh rib fractures with callus. No pneumonic consolidation, effusion or pneumothorax. IMPRESSION: No pneumonic consolidation. Electronically Signed   By: Ashley Royalty M.D.   On: 03/16/2017 21:10   Dg Thoracic Spine 2  View  Result Date: 03/16/2017 CLINICAL DATA:  Pt fell on L hip after slipping on bandaged part of L foot while searching for remote. Reports L lateral hip pain and lower back pain. Pt also states she has neuropathy to lower left leg and is unsure if she broke anything. EXAM: THORACIC SPINE 2 VIEWS COMPARISON:  08/25/2016 FINDINGS: Body habitus and motion artifact reduced diagnostic sensitivity and specificity. Thoracic spondylosis. Upper thoracic spine not well depicted due to the body habitus and patient's shoulders. No obvious compression in the mid and lower thoracic spine. IMPRESSION: 1. No definite fracture or subluxation, upper thoracic spine not well seen on the lateral projection due to body habitus and motion artifact. 2. Mild thoracic spondylosis. Electronically Signed   By: Van Clines M.D.   On: 03/16/2017 19:11   Dg Lumbar Spine Complete  Result Date: 03/16/2017 CLINICAL DATA:  Pt fell on L hip after slipping on bandaged part of L foot while searching for remote. Reports L lateral hip pain and lower back pain. Pt also states she has neuropathy to lower left leg and is unsure if she broke anything. EXAM: LUMBAR SPINE - COMPLETE 4+ VIEW COMPARISON:  11/11/2012 CT abdomen FINDINGS: PLIF at Y8-6 without complicating feature. Mild bony demineralization. The arcuate lines of the sacrum appear continuous. SI joints unremarkable. Clustered density in the right upper quadrant compatible with mulberry type gallstone. Bony detail is lost on the lateral projection due to body habitus, but there is no obvious vertebral collapse or malalignment. IMPRESSION: 1. No acute lumbar spine findings. 2. 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 3. Remote PLIF at L4-5. Electronically Signed   By: Van Clines M.D.   On: 03/16/2017 19:13   Dg Tibia/fibula Left  Result Date: 03/16/2017 CLINICAL DATA:  Pt fell on L hip after slipping on bandaged part of L foot while searching for remote. Reports  L lateral hip pain and lower back pain. Pt also states she has neuropathy to lower left leg and is unsure if she broke anything. EXAM: LEFT TIBIA AND FIBULA - 2 VIEW COMPARISON:  Knee radiographs from 08/22/2012 FINDINGS: Periosteal reaction and some  deformity in the mid tibia and fibula, but with a chronic appearance possibly related to prior fractures and/ or hardware. Extensive bony hindfoot deformity, with presumed fracture or collapse of the talus, and fusion of the distal tibia and fibula with the calcaneus through 2 lag screws. There is considerable lucency around both lag screws suggesting loosening or infection, and I can't exclude pseudoarticulation of the distal tibia and fibula with the irregular and partially collapsed calcaneus. Severe degeneration of the Chopart joint and poor visualization of the cuboid. Calcifications along the expected location of the Achilles tendon. IMPRESSION: 1. Extensive hindfoot deformity and collapse. Lucency around the hindfoot lag screws suggesting loosening or infection. 2. Chronic appearing periostitis in the mid mid shafts of the tibia and fibula extending to the ankle, with midshaft deformities possibly related to prior hardware. Electronically Signed   By: Van Clines M.D.   On: 03/16/2017 19:17   Pelvis Portable  Result Date: 03/18/2017 CLINICAL DATA:  Internal fixation left hip fracture EXAM: PORTABLE PELVIS 1-2 VIEWS COMPARISON:  03/16/2017 FINDINGS: Changes of internal fixation across the left femoral intertrochanteric fracture. Moderate displacement of the lesser trochanter noted. Otherwise near anatomic alignment. No hardware complicating features. IMPRESSION: Internal fixation across the left femoral intertrochanteric fracture with displaced lesser trochanter. Electronically Signed   By: Rolm Baptise M.D.   On: 03/18/2017 11:27   Dg Shoulder Left  Result Date: 03/16/2017 CLINICAL DATA:  Fall with left shoulder pain EXAM: LEFT SHOULDER - 2+ VIEW  COMPARISON:  10/16/2012 FINDINGS: No acute displaced fracture or dislocation. Small calcifications adjacent to the humeral head suggesting calcific tendinitis. Mild AC joint degenerative change IMPRESSION: 1. No acute osseous abnormality 2. Degenerative change at the [facility] joint. Probable calcific tendinitis Electronically Signed   By: Donavan Foil M.D.   On: 03/16/2017 22:34   Dg Foot Complete Left  Result Date: 03/16/2017 CLINICAL DATA:  Pt fell on L hip after slipping on bandaged part of L foot while searching for remote. Reports L lateral hip pain and lower back pain. Pt also states she has neuropathy to lower left leg and is unsure if she broke anything. EXAM: LEFT FOOT - COMPLETE 3+ VIEW COMPARISON:  None. FINDINGS: Hindfoot collapse with nonvisualization of the talus, collapse and partial fragmentation of the navicular, severe arthropathy at the Chopart joint, severe arthropathy in the dorsal midfoot, and attempted fusion of the flattened calcaneus with the distal tibia and fibula with lucency along the rib lag screws suggesting loosening or infection. Cough probable pseudarthrosis at the articulation between the tibia/ fibula and the flattened calcaneus. Ossific fragments along the expected Achilles tendon location. Bony demineralization. No malalignment at the Lisfranc joint. Transverse indistinct lucency in the distal phalanx great toe, age indeterminate fracture. IMPRESSION: 1. Complex collapse of the hindfoot and part of the midfoot. Probable loosening along the lag screws extending through the collapsed calcaneus into the tibia, and pseudarthrosis between the tibia/fibula and the collapsed calcaneus. 2. Age-indeterminate fracture of the distal phalanx great toe. 3. Bony demineralization. Electronically Signed   By: Van Clines M.D.   On: 03/16/2017 19:20   Dg C-arm 61-120 Min-no Report  Result Date: 03/18/2017 Fluoroscopy was utilized by the requesting physician.  No radiographic  interpretation.   Dg Hip Unilat With Pelvis 2-3 Views Left  Result Date: 03/16/2017 CLINICAL DATA:  Pt fell on L hip after slipping on bandaged part of L foot while searching for remote. Reports L lateral hip pain and lower back pain. Pt also states  she has neuropathy to lower left leg and is unsure if she broke anything. EXAM: DG HIP (WITH OR WITHOUT PELVIS) 2-3V LEFT COMPARISON:  None FINDINGS: PLIF at L4-5. Comminuted left intertrochanteric hip fracture, with separate lesser trochanteric fragment. Varus angulation at the fracture site. Bony demineralization in the pelvis. IMPRESSION: 1. Comminuted left hip intertrochanteric fracture. Electronically Signed   By: Van Clines M.D.   On: 03/16/2017 19:10   Dg Femur Min 2 Views Left  Result Date: 03/18/2017 CLINICAL DATA:  ORIF left hip fracture EXAM: LEFT FEMUR 2 VIEWS; DG C-ARM 61-120 MIN-NO REPORT COMPARISON:  03/16/2017 FINDINGS: Internal fixation across the left femoral intertrochanteric fracture. Anatomic alignment. No hardware complicating feature. IMPRESSION: Internal fixation across the left intertrochanteric fracture. Electronically Signed   By: Rolm Baptise M.D.   On: 03/18/2017 09:57   Dg Femur Port Min 2 Views Left  Result Date: 03/18/2017 CLINICAL DATA:  Internal fixation EXAM: LEFT FEMUR PORTABLE 2 VIEWS COMPARISON:  03/16/2017 FINDINGS: Intramedullary nail and dynamic hip screw noted across the right femoral intertrochanteric fracture. Displacement of the lesser trochanter since. Otherwise near anatomic alignment. No hardware complicating feature. IMPRESSION: Internal fixation across the left femoral intertrochanteric fracture with displaced lesser trochanter. Electronically Signed   By: Rolm Baptise M.D.   On: 03/18/2017 11:27    Subjective: Seen and examined at bedside and was doing well. No nausea or vomiting and had a good night. Had mild hip pain. No other concerns or complaints and ready to go to SNF.  Discharge  Exam: Vitals:   03/20/17 2059 03/21/17 0430  BP: (!) 132/53 123/65  Pulse: 75 65  Resp: 18 18  Temp: 98 F (36.7 C) 98.7 F (37.1 C)   Vitals:   03/20/17 1000 03/20/17 1344 03/20/17 2059 03/21/17 0430  BP: (!) 121/52 (!) 127/50 (!) 132/53 123/65  Pulse: 88 75 75 65  Resp: _0 Temp: 98.2 F (36.8 C) 97.5 F (36.4 C) 98 F (36.7 C) 98.7 F (37.1 C)  TempSrc:  Oral Oral Oral  SpO2: 93% 96% 98% 97%  Weight:      Height:       General: Pt is alert, awake, not in acute distress Cardiovascular: RRR, S1/S2 +, no rubs, no gallops Respiratory: Diminished bilaterally, no wheezing, no rhonchi Abdominal: Soft, NT, ND, bowel sounds + Extremities: no edema, no cyanosis; Left foot has some ulceration/wounds  The results of significant diagnostics from this hospitalization (including imaging, microbiology, ancillary and laboratory) are listed below for reference.    Microbiology: Recent Results (from the past 240 hour(s))  Surgical pcr screen     Status: None   Collection Time: 03/17/17 10:18 AM  Result Value Ref Range Status   MRSA, PCR NEGATIVE NEGATIVE Final   Staphylococcus aureus NEGATIVE NEGATIVE Final    Comment:        The Xpert SA Assay (FDA approved for NASAL specimens in patients over 58 years of age), is one component of a comprehensive surveillance program.  Test performance has been validated by [facility] for patients greater than or equal to 71 year old. It is not intended to diagnose infection nor to guide or monitor treatment.     Labs: BNP (last 3 results) No results for input(s): BNP in the last 8760 hours. Basic Metabolic Panel:  Recent Labs Lab 03/17/17 0649 03/18/17 0345 03/19/17 0345 03/20/17 0517 03/21/17 0543  NA 137 137 140 140 136  K 5.4* 5.7* 5.2* 4.1 4.4  CL  103 105 109 108 105  CO2 _0 GLUCOSE 140* 150* 324* 230* 207*  BUN 17 18 26* 34* 30*  CREATININE 1.35* 1.27* 1.35* 1.14* 1.11*  CALCIUM 8.5* 8.3* 8.1*  8.4* 8.3*   Liver Function Tests:  Recent Labs Lab 03/16/17 1903 03/17/17 0649 03/18/17 0345 03/19/17 0345 03/21/17 0543  AST 52*  --  44* 39 41  ALT 28  --  _1 ALKPHOS 162*  --  144* 116 111  BILITOT 0.6  --  1.6* 1.2 1.6*  PROT 6.6  --  6.8 6.3* 6.0*  ALBUMIN 2.6* 2.7* 2.7* 2.3* 2.3*   No results for input(s): LIPASE, AMYLASE in the last 168 hours.  Recent Labs Lab 03/17/17 0920 03/18/17 1953  AMMONIA 49* 38*   CBC:  Recent Labs Lab 03/16/17 1903 03/17/17 0649 03/18/17 0345 03/19/17 0345 03/20/17 0517 03/21/17 0543  WBC 5.0 8.3 7.8 10.4 10.3 8.0  NEUTROABS 3.5  --   --   --   --   --   HGB 8.7* 10.1* 10.0* 8.8* 8.3* 8.3*  HCT 26.8* 31.6* 32.0* 28.2* 25.9* 25.3*  MCV 97.8 98.4 98.8 97.9 97.0 98.1  PLT 112* 125* 110* 133* 139* 101*   Cardiac Enzymes: No results for input(s): CKTOTAL, CKMB, CKMBINDEX, TROPONINI in the last 168 hours. BNP: Invalid input(s): POCBNP CBG:  Recent Labs Lab 03/20/17 1148 03/20/17 1641 03/20/17 2018 03/20/17 2354 03/21/17 0730  GLUCAP 264* 240* 273* 256* 183*   D-Dimer No results for input(s): DDIMER in the last 72 hours. Hgb A1c No results for input(s): HGBA1C in the last 72 hours. Lipid Profile No results for input(s): CHOL, HDL, LDLCALC, TRIG, CHOLHDL, LDLDIRECT in the last 72 hours. Thyroid function studies No results for input(s): TSH, T4TOTAL, T3FREE, THYROIDAB in the last 72 hours.  Invalid input(s): FREET3 Anemia work up No results for input(s): VITAMINB12, FOLATE, FERRITIN, TIBC, IRON, RETICCTPCT in the last 72 hours. Urinalysis    Component Value Date/Time   COLORURINE YELLOW 03/16/2017 2348   APPEARANCEUR CLEAR 03/16/2017 2348   LABSPEC 1.011 03/16/2017 2348   PHURINE 6.0 03/16/2017 2348   GLUCOSEU NEGATIVE 03/16/2017 2348   GLUCOSEU NEGATIVE 09/14/2015 0808   HGBUR NEGATIVE 03/16/2017 2348   BILIRUBINUR NEGATIVE 03/16/2017 2348   KETONESUR NEGATIVE 03/16/2017 2348   PROTEINUR NEGATIVE  03/16/2017 2348   UROBILINOGEN 1.0 09/14/2015 0808   NITRITE NEGATIVE 03/16/2017 2348   LEUKOCYTESUR NEGATIVE 03/16/2017 2348   Sepsis Labs Invalid input(s): PROCALCITONIN,  WBC,  LACTICIDVEN Microbiology Recent Results (from the past 240 hour(s))  Surgical pcr screen     Status: None   Collection Time: 03/17/17 10:18 AM  Result Value Ref Range Status   MRSA, PCR NEGATIVE NEGATIVE Final   Staphylococcus aureus NEGATIVE NEGATIVE Final    Comment:        The Xpert SA Assay (FDA approved for NASAL specimens in patients over 32 years of age), is one component of a comprehensive surveillance program.  Test performance has been validated by [facility] for patients greater than or equal to 22 year old. It is not intended to diagnose infection nor to guide or monitor treatment.    Time coordinating discharge: 35 minutes  SIGNED:  Kerney Elbe, DO [facility] 03/21/2017, 11:19 AM Pager [phone]  If 7PM-7AM, please contact night-coverage [email or web address] Password [email or web address]

## 2017-04-08 ENCOUNTER — Emergency Department (HOSPITAL_COMMUNITY): Payer: Medicare Other

## 2017-04-08 ENCOUNTER — Inpatient Hospital Stay (HOSPITAL_COMMUNITY)
Admission: EM | Admit: 2017-04-08 | Discharge: 2017-04-16 | DRG: 480 | Disposition: A | Discharge status: Skilled Nursing Facility | Payer: Medicare Other | Attending: Internal Medicine | Admitting: Internal Medicine

## 2017-04-08 ENCOUNTER — Encounter (HOSPITAL_COMMUNITY): Payer: Self-pay | Admitting: Emergency Medicine

## 2017-04-08 DIAGNOSIS — Y92009 Unspecified place in unspecified non-institutional (private) residence as the place of occurrence of the external cause: Secondary | ICD-10-CM

## 2017-04-08 DIAGNOSIS — R443 Hallucinations, unspecified: Secondary | ICD-10-CM | POA: Diagnosis not present

## 2017-04-08 DIAGNOSIS — Z792 Long term (current) use of antibiotics: Secondary | ICD-10-CM

## 2017-04-08 DIAGNOSIS — S72351S Displaced comminuted fracture of shaft of right femur, sequela: Secondary | ICD-10-CM | POA: Diagnosis not present

## 2017-04-08 DIAGNOSIS — E11610 Type 2 diabetes mellitus with diabetic neuropathic arthropathy: Secondary | ICD-10-CM | POA: Diagnosis present

## 2017-04-08 DIAGNOSIS — Z90710 Acquired absence of both cervix and uterus: Secondary | ICD-10-CM | POA: Diagnosis not present

## 2017-04-08 DIAGNOSIS — R7881 Bacteremia: Secondary | ICD-10-CM | POA: Diagnosis not present

## 2017-04-08 DIAGNOSIS — A411 Sepsis due to other specified staphylococcus: Secondary | ICD-10-CM | POA: Diagnosis not present

## 2017-04-08 DIAGNOSIS — S72141A Displaced intertrochanteric fracture of right femur, initial encounter for closed fracture: Principal | ICD-10-CM | POA: Diagnosis present

## 2017-04-08 DIAGNOSIS — Y92230 Patient room in hospital as the place of occurrence of the external cause: Secondary | ICD-10-CM | POA: Diagnosis not present

## 2017-04-08 DIAGNOSIS — T80218A Other infection due to central venous catheter, initial encounter: Secondary | ICD-10-CM | POA: Diagnosis not present

## 2017-04-08 DIAGNOSIS — Z8249 Family history of ischemic heart disease and other diseases of the circulatory system: Secondary | ICD-10-CM

## 2017-04-08 DIAGNOSIS — K7290 Hepatic failure, unspecified without coma: Secondary | ICD-10-CM | POA: Diagnosis present

## 2017-04-08 DIAGNOSIS — Z7982 Long term (current) use of aspirin: Secondary | ICD-10-CM

## 2017-04-08 DIAGNOSIS — Z09 Encounter for follow-up examination after completed treatment for conditions other than malignant neoplasm: Secondary | ICD-10-CM

## 2017-04-08 DIAGNOSIS — K219 Gastro-esophageal reflux disease without esophagitis: Secondary | ICD-10-CM | POA: Diagnosis present

## 2017-04-08 DIAGNOSIS — L89152 Pressure ulcer of sacral region, stage 2: Secondary | ICD-10-CM | POA: Diagnosis not present

## 2017-04-08 DIAGNOSIS — E1122 Type 2 diabetes mellitus with diabetic chronic kidney disease: Secondary | ICD-10-CM | POA: Diagnosis present

## 2017-04-08 DIAGNOSIS — Z91048 Other nonmedicinal substance allergy status: Secondary | ICD-10-CM

## 2017-04-08 DIAGNOSIS — Z79891 Long term (current) use of opiate analgesic: Secondary | ICD-10-CM

## 2017-04-08 DIAGNOSIS — E860 Dehydration: Secondary | ICD-10-CM | POA: Diagnosis present

## 2017-04-08 DIAGNOSIS — G92 Toxic encephalopathy: Secondary | ICD-10-CM | POA: Diagnosis not present

## 2017-04-08 DIAGNOSIS — Z794 Long term (current) use of insulin: Secondary | ICD-10-CM

## 2017-04-08 DIAGNOSIS — D539 Nutritional anemia, unspecified: Secondary | ICD-10-CM | POA: Diagnosis present

## 2017-04-08 DIAGNOSIS — E871 Hypo-osmolality and hyponatremia: Secondary | ICD-10-CM | POA: Diagnosis present

## 2017-04-08 DIAGNOSIS — E876 Hypokalemia: Secondary | ICD-10-CM | POA: Diagnosis present

## 2017-04-08 DIAGNOSIS — Z885 Allergy status to narcotic agent status: Secondary | ICD-10-CM

## 2017-04-08 DIAGNOSIS — I129 Hypertensive chronic kidney disease with stage 1 through stage 4 chronic kidney disease, or unspecified chronic kidney disease: Secondary | ICD-10-CM | POA: Diagnosis present

## 2017-04-08 DIAGNOSIS — R0602 Shortness of breath: Secondary | ICD-10-CM | POA: Diagnosis present

## 2017-04-08 DIAGNOSIS — I9581 Postprocedural hypotension: Secondary | ICD-10-CM | POA: Diagnosis not present

## 2017-04-08 DIAGNOSIS — I10 Essential (primary) hypertension: Secondary | ICD-10-CM | POA: Diagnosis not present

## 2017-04-08 DIAGNOSIS — E875 Hyperkalemia: Secondary | ICD-10-CM | POA: Diagnosis present

## 2017-04-08 DIAGNOSIS — K7460 Unspecified cirrhosis of liver: Secondary | ICD-10-CM | POA: Diagnosis present

## 2017-04-08 DIAGNOSIS — D696 Thrombocytopenia, unspecified: Secondary | ICD-10-CM | POA: Diagnosis present

## 2017-04-08 DIAGNOSIS — D6959 Other secondary thrombocytopenia: Secondary | ICD-10-CM | POA: Diagnosis present

## 2017-04-08 DIAGNOSIS — K7469 Other cirrhosis of liver: Secondary | ICD-10-CM | POA: Diagnosis present

## 2017-04-08 DIAGNOSIS — Z969 Presence of functional implant, unspecified: Secondary | ICD-10-CM

## 2017-04-08 DIAGNOSIS — B957 Other staphylococcus as the cause of diseases classified elsewhere: Secondary | ICD-10-CM | POA: Diagnosis not present

## 2017-04-08 DIAGNOSIS — Z888 Allergy status to other drugs, medicaments and biological substances status: Secondary | ICD-10-CM | POA: Diagnosis not present

## 2017-04-08 DIAGNOSIS — Z419 Encounter for procedure for purposes other than remedying health state, unspecified: Secondary | ICD-10-CM

## 2017-04-08 DIAGNOSIS — N183 Chronic kidney disease, stage 3 (moderate): Secondary | ICD-10-CM | POA: Diagnosis present

## 2017-04-08 DIAGNOSIS — Z833 Family history of diabetes mellitus: Secondary | ICD-10-CM

## 2017-04-08 DIAGNOSIS — S72001A Fracture of unspecified part of neck of right femur, initial encounter for closed fracture: Secondary | ICD-10-CM

## 2017-04-08 DIAGNOSIS — R451 Restlessness and agitation: Secondary | ICD-10-CM | POA: Diagnosis not present

## 2017-04-08 DIAGNOSIS — G2581 Restless legs syndrome: Secondary | ICD-10-CM | POA: Diagnosis present

## 2017-04-08 DIAGNOSIS — R339 Retention of urine, unspecified: Secondary | ICD-10-CM | POA: Diagnosis not present

## 2017-04-08 DIAGNOSIS — Z7901 Long term (current) use of anticoagulants: Secondary | ICD-10-CM | POA: Diagnosis not present

## 2017-04-08 DIAGNOSIS — Z800 Family history of malignant neoplasm of digestive organs: Secondary | ICD-10-CM

## 2017-04-08 DIAGNOSIS — M869 Osteomyelitis, unspecified: Secondary | ICD-10-CM

## 2017-04-08 DIAGNOSIS — Z6841 Body Mass Index (BMI) 40.0 and over, adult: Secondary | ICD-10-CM | POA: Diagnosis not present

## 2017-04-08 DIAGNOSIS — S31000A Unspecified open wound of lower back and pelvis without penetration into retroperitoneum, initial encounter: Secondary | ICD-10-CM | POA: Diagnosis not present

## 2017-04-08 DIAGNOSIS — E1142 Type 2 diabetes mellitus with diabetic polyneuropathy: Secondary | ICD-10-CM | POA: Diagnosis present

## 2017-04-08 DIAGNOSIS — W06XXXA Fall from bed, initial encounter: Secondary | ICD-10-CM | POA: Diagnosis present

## 2017-04-08 DIAGNOSIS — F329 Major depressive disorder, single episode, unspecified: Secondary | ICD-10-CM | POA: Diagnosis present

## 2017-04-08 DIAGNOSIS — R509 Fever, unspecified: Secondary | ICD-10-CM

## 2017-04-08 DIAGNOSIS — S7222XA Displaced subtrochanteric fracture of left femur, initial encounter for closed fracture: Secondary | ICD-10-CM | POA: Diagnosis present

## 2017-04-08 DIAGNOSIS — M14672 Charcot's joint, left ankle and foot: Secondary | ICD-10-CM | POA: Diagnosis present

## 2017-04-08 DIAGNOSIS — K7581 Nonalcoholic steatohepatitis (NASH): Secondary | ICD-10-CM | POA: Diagnosis present

## 2017-04-08 DIAGNOSIS — G4733 Obstructive sleep apnea (adult) (pediatric): Secondary | ICD-10-CM | POA: Diagnosis present

## 2017-04-08 DIAGNOSIS — Z981 Arthrodesis status: Secondary | ICD-10-CM

## 2017-04-08 DIAGNOSIS — Z801 Family history of malignant neoplasm of trachea, bronchus and lung: Secondary | ICD-10-CM

## 2017-04-08 DIAGNOSIS — L8990 Pressure ulcer of unspecified site, unspecified stage: Secondary | ICD-10-CM | POA: Insufficient documentation

## 2017-04-08 DIAGNOSIS — G4730 Sleep apnea, unspecified: Secondary | ICD-10-CM | POA: Diagnosis present

## 2017-04-08 DIAGNOSIS — T50905A Adverse effect of unspecified drugs, medicaments and biological substances, initial encounter: Secondary | ICD-10-CM | POA: Diagnosis not present

## 2017-04-08 DIAGNOSIS — S72351A Displaced comminuted fracture of shaft of right femur, initial encounter for closed fracture: Secondary | ICD-10-CM | POA: Diagnosis not present

## 2017-04-08 DIAGNOSIS — Z452 Encounter for adjustment and management of vascular access device: Secondary | ICD-10-CM

## 2017-04-08 LAB — AMMONIA: AMMONIA: 62 umol/L — AB (ref 9–35)

## 2017-04-08 LAB — CBC WITH DIFFERENTIAL/PLATELET
BASOS ABS: 0 10*3/uL (ref 0.0–0.1)
BASOS PCT: 1 %
EOS ABS: 0.1 10*3/uL (ref 0.0–0.7)
Eosinophils Relative: 3 %
HEMATOCRIT: 27.9 % — AB (ref 36.0–46.0)
HEMOGLOBIN: 8.5 g/dL — AB (ref 12.0–15.0)
Lymphocytes Relative: 14 %
Lymphs Abs: 0.6 10*3/uL — ABNORMAL LOW (ref 0.7–4.0)
MCH: 30.7 pg (ref 26.0–34.0)
MCHC: 30.5 g/dL (ref 30.0–36.0)
MCV: 100.7 fL — ABNORMAL HIGH (ref 78.0–100.0)
MONOS PCT: 8 %
Monocytes Absolute: 0.3 10*3/uL (ref 0.1–1.0)
NEUTROS ABS: 3 10*3/uL (ref 1.7–7.7)
NEUTROS PCT: 74 %
Platelets: 91 10*3/uL — ABNORMAL LOW (ref 150–400)
RBC: 2.77 MIL/uL — AB (ref 3.87–5.11)
RDW: 15.8 % — ABNORMAL HIGH (ref 11.5–15.5)
WBC: 4.1 10*3/uL (ref 4.0–10.5)

## 2017-04-08 LAB — GLUCOSE, CAPILLARY
GLUCOSE-CAPILLARY: 129 mg/dL — AB (ref 65–99)
Glucose-Capillary: 115 mg/dL — ABNORMAL HIGH (ref 65–99)

## 2017-04-08 LAB — BASIC METABOLIC PANEL
ANION GAP: 5 (ref 5–15)
BUN: 13 mg/dL (ref 6–20)
CHLORIDE: 104 mmol/L (ref 101–111)
CO2: 25 mmol/L (ref 22–32)
CREATININE: 1.18 mg/dL — AB (ref 0.44–1.00)
Calcium: 8.8 mg/dL — ABNORMAL LOW (ref 8.9–10.3)
GFR calc non Af Amer: 47 mL/min — ABNORMAL LOW (ref >60)
GFR, EST AFRICAN AMERICAN: 54 mL/min — AB (ref >60)
Glucose, Bld: 151 mg/dL — ABNORMAL HIGH (ref 65–99)
POTASSIUM: 5.2 mmol/L — AB (ref 3.5–5.1)
SODIUM: 134 mmol/L — AB (ref 135–145)

## 2017-04-08 LAB — MRSA PCR SCREENING: MRSA BY PCR: NEGATIVE

## 2017-04-08 MED ORDER — EQL OMEGA 3 FISH OIL 1400 MG PO CAPS: 1.0000 | ORAL_CAPSULE | Freq: Every day | ORAL | Status: DC

## 2017-04-08 MED ORDER — METHOCARBAMOL 1000 MG/10ML IJ SOLN
500.0000 mg | Freq: Four times a day (QID) | INTRAVENOUS | Status: DC | PRN
  Filled 2017-04-08: qty 5

## 2017-04-08 MED ORDER — CHLORHEXIDINE GLUCONATE 4 % EX LIQD
60.0000 mL | Freq: Once | CUTANEOUS | Status: AC
  Administered 2017-04-09: 4 via TOPICAL
  Filled 2017-04-08 (×2): qty 60

## 2017-04-08 MED ORDER — INSULIN ASPART 100 UNIT/ML ~~LOC~~ SOLN
0.0000 [IU] | Freq: Every day | SUBCUTANEOUS | Status: DC
  Administered 2017-04-09: 2 [IU] via SUBCUTANEOUS
  Administered 2017-04-11: 4 [IU] via SUBCUTANEOUS
  Administered 2017-04-12: 3 [IU] via SUBCUTANEOUS
  Administered 2017-04-14: 2 [IU] via SUBCUTANEOUS

## 2017-04-08 MED ORDER — OMEGA-3-ACID ETHYL ESTERS 1 G PO CAPS
1.0000 g | ORAL_CAPSULE | Freq: Every day | ORAL | Status: DC
  Administered 2017-04-09 – 2017-04-14 (×5): 1 g via ORAL
  Filled 2017-04-08 (×6): qty 1

## 2017-04-08 MED ORDER — MAGNESIUM OXIDE 400 MG PO CAPS: 400.0000 mg | ORAL_CAPSULE | Freq: Every day | ORAL | Status: DC

## 2017-04-08 MED ORDER — GABAPENTIN 600 MG PO TABS
600.0000 mg | ORAL_TABLET | Freq: Three times a day (TID) | ORAL | Status: DC
  Administered 2017-04-09 – 2017-04-10 (×3): 600 mg via ORAL
  Filled 2017-04-08 (×3): qty 1

## 2017-04-08 MED ORDER — INSULIN ASPART 100 UNIT/ML ~~LOC~~ SOLN
0.0000 [IU] | Freq: Three times a day (TID) | SUBCUTANEOUS | Status: DC
  Administered 2017-04-09: 2 [IU] via SUBCUTANEOUS
  Administered 2017-04-10 – 2017-04-11 (×4): 3 [IU] via SUBCUTANEOUS
  Administered 2017-04-12: 7 [IU] via SUBCUTANEOUS
  Administered 2017-04-12: 3 [IU] via SUBCUTANEOUS
  Administered 2017-04-13: 2 [IU] via SUBCUTANEOUS
  Administered 2017-04-13 (×2): 3 [IU] via SUBCUTANEOUS
  Administered 2017-04-14: 2 [IU] via SUBCUTANEOUS
  Administered 2017-04-14: 3 [IU] via SUBCUTANEOUS

## 2017-04-08 MED ORDER — SODIUM CHLORIDE 0.9 % IV SOLN
INTRAVENOUS | Status: DC
  Administered 2017-04-08 – 2017-04-11 (×8): via INTRAVENOUS

## 2017-04-08 MED ORDER — MAGNESIUM OXIDE 400 (241.3 MG) MG PO TABS
400.0000 mg | ORAL_TABLET | Freq: Every day | ORAL | Status: DC
  Administered 2017-04-09 – 2017-04-14 (×5): 400 mg via ORAL
  Filled 2017-04-08 (×6): qty 1

## 2017-04-08 MED ORDER — POLYETHYLENE GLYCOL 3350 17 G PO PACK: 17.0000 g | PACK | Freq: Three times a day (TID) | ORAL | Status: DC | PRN

## 2017-04-08 MED ORDER — PANTOPRAZOLE SODIUM 40 MG PO TBEC
40.0000 mg | DELAYED_RELEASE_TABLET | Freq: Every day | ORAL | Status: DC
  Administered 2017-04-09 – 2017-04-13 (×5): 40 mg via ORAL
  Filled 2017-04-08 (×5): qty 1

## 2017-04-08 MED ORDER — DULOXETINE HCL 60 MG PO CPEP
60.0000 mg | ORAL_CAPSULE | Freq: Every day | ORAL | Status: DC
  Administered 2017-04-09 – 2017-04-14 (×6): 60 mg via ORAL
  Filled 2017-04-08 (×7): qty 1

## 2017-04-08 MED ORDER — POVIDONE-IODINE 10 % EX SWAB: 2.0000 "application " | Freq: Once | CUTANEOUS | Status: DC

## 2017-04-08 MED ORDER — MORPHINE SULFATE (PF) 4 MG/ML IV SOLN
4.0000 mg | Freq: Once | INTRAVENOUS | Status: AC
  Administered 2017-04-08: 4 mg via INTRAVENOUS
  Filled 2017-04-08: qty 1

## 2017-04-08 MED ORDER — FLUTICASONE PROPIONATE 50 MCG/ACT NA SUSP
1.0000 | Freq: Every day | NASAL | Status: DC | PRN
  Filled 2017-04-08: qty 16

## 2017-04-08 MED ORDER — ONDANSETRON HCL 4 MG/2ML IJ SOLN
4.0000 mg | Freq: Once | INTRAMUSCULAR | Status: AC
  Administered 2017-04-08: 4 mg via INTRAVENOUS
  Filled 2017-04-08: qty 2

## 2017-04-08 MED ORDER — GABAPENTIN 800 MG PO TABS
800.0000 mg | ORAL_TABLET | Freq: Three times a day (TID) | ORAL | Status: DC
  Filled 2017-04-08: qty 1

## 2017-04-08 MED ORDER — MORPHINE SULFATE (PF) 4 MG/ML IV SOLN
1.0000 mg | INTRAVENOUS | Status: DC | PRN
  Administered 2017-04-09 (×2): 2 mg via INTRAVENOUS
  Administered 2017-04-10: 3 mg via INTRAVENOUS
  Administered 2017-04-10: 2 mg via INTRAVENOUS
  Administered 2017-04-10: 4 mg via INTRAVENOUS
  Filled 2017-04-08 (×5): qty 1

## 2017-04-08 MED ORDER — BUPROPION HCL ER (XL) 150 MG PO TB24
300.0000 mg | ORAL_TABLET | Freq: Every day | ORAL | Status: DC
  Administered 2017-04-09 – 2017-04-14 (×6): 300 mg via ORAL
  Filled 2017-04-08 (×6): qty 2

## 2017-04-08 MED ORDER — INSULIN ASPART PROT & ASPART (70-30 MIX) 100 UNIT/ML ~~LOC~~ SUSP
30.0000 [IU] | Freq: Once | SUBCUTANEOUS | Status: DC
  Filled 2017-04-08: qty 10

## 2017-04-08 MED ORDER — CEFAZOLIN SODIUM-DEXTROSE 2-4 GM/100ML-% IV SOLN
2.0000 g | INTRAVENOUS | Status: AC
  Administered 2017-04-09: 2 g via INTRAVENOUS
  Filled 2017-04-08: qty 100

## 2017-04-08 MED ORDER — PRAMIPEXOLE DIHYDROCHLORIDE 1 MG PO TABS
2.0000 mg | ORAL_TABLET | Freq: Every day | ORAL | Status: DC
  Administered 2017-04-09 – 2017-04-13 (×5): 2 mg via ORAL
  Filled 2017-04-08 (×7): qty 2

## 2017-04-08 MED ORDER — RIFAXIMIN 550 MG PO TABS
550.0000 mg | ORAL_TABLET | Freq: Two times a day (BID) | ORAL | Status: DC
  Administered 2017-04-09 – 2017-04-14 (×11): 550 mg via ORAL
  Filled 2017-04-08 (×12): qty 1

## 2017-04-08 MED ORDER — OXYCODONE HCL 5 MG PO TABS
5.0000 mg | ORAL_TABLET | ORAL | Status: DC | PRN
  Administered 2017-04-09: 5 mg via ORAL
  Administered 2017-04-09: 10 mg via ORAL
  Filled 2017-04-08: qty 2
  Filled 2017-04-08 (×2): qty 1
  Filled 2017-04-08: qty 2

## 2017-04-08 MED ORDER — SODIUM CHLORIDE 0.9 % IV SOLN: INTRAVENOUS | Status: DC

## 2017-04-08 NOTE — ED Provider Notes (Signed)
[facility] DEPT Provider Note   CSN: [id number] Arrival date & time: 04/08/17  1334     History   Chief Complaint Chief Complaint  Patient presents with  . Fall  . Hip Pain    HPI Daisy Larson is a 66 y.o. female.  66 year old female presents after mechanical fall at home from her bed. Phone to her right hip is now unable to ambulate. Complains of severe pain is worse with any movement. Does have a prior history of left hip surgery. Denies any head injury or loss of consciousness. No neck, back, chest or abdominal pain. Called EMS and patient's right lower extremity was found to be shortened and rotated. EMS gave patient fentanyl and transported her here.      Past Medical History:  Diagnosis Date  . Allergy    tape  . Angina    COSTOCONDRITIS    . Anxiety   . 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 Breast cancer ([place]) 01/19/12    right breast lumpectomy/ER/PR positibe,her-2 neg.  . Breast cancer ([place]) 2011  . Bundle branch block   . Cirrhosis ([place])   . Colon polyps 12/02/2012   TUBULAR ADENOMAS (X2) and Hyperplastic (X1)  . Complication of anesthesia    WITH SECON HERNIA REPAIR 2012 [facility]  DIFFICULTY O2 SAT DROPPING ADMIT TO HOSPITAL   . Depression   . Diabetes mellitus   . Diverticulosis   . External hemorrhoids   . Gastritis   . GERD (gastroesophageal reflux disease)    ULCERS  . Goiter   . Headache(784.0)    MIGRAINES  . Heart murmur   . Hepatitis     Did Not have hepatitis but needed Hep A and Hep B injections2012 SPOTS ON LIVER  DR Alonza Bogus  [phone]  . Hernia   . History of stomach ulcers   . Hypertension   . Hyperthyroidism    NODULES ON THYROID  (DR BALEN 37  12-609)  . Iron deficiency anemia 10/16/2012  . Multiple thyroid nodules   . Neuromuscular disorder ([place])    PERIPH NEUROPATHY   . Neuropathy of foot   . Restless legs   . S/P radiation therapy 02/19/12 - 04/01/12   Right Breast: 5000 cgy/25 Fractions with Boost/ 1000 cGy/5  Fractions  . Short of breath on exertion   . Shortness of breath    WITH EXERTION   . Sleep apnea     Patient Active Problem List   Diagnosis Date Noted  . Closed comminuted intertrochanteric fracture of left femur ([place]) 03/18/2017  . Hyperkalemia 03/16/2017  . Charcot ankle, left 03/16/2017  . Other cirrhosis of liver ([place]) 03/16/2017  . Hip fracture ([place]) 03/16/2017  . Prolonged QT interval 03/16/2017  . 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 Essential hypertension 08/27/2014  . History of cardiac dysrhythmia 08/27/2014  . Charcot's joint of right foot 08/27/2014  . Chronic hypercapnic respiratory failure ([place]) 08/27/2014  . Long-term insulin use ([place]) 08/27/2014  . Depression, major, recurrent ([place]) 08/27/2014  . Nontoxic multinodular goiter 06/24/2014  . Osteopenia 11/13/2013  . Thrombocytopenia, unspecified ([place]) 05/12/2013  . Iron deficiency anemia 10/16/2012  . Encephalopathy acute 10/09/2012  . OSA (obstructive sleep apnea) 10/09/2012  . 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 Pica in adults 10/08/2012  . Anemia 10/08/2012  . Syncope 10/07/2012  . History of breast cancer in female 01/19/2012  . Malignant neoplasm of upper-outer quadrant of right breast in female, estrogen receptor positive ([place]) 01/09/2012  .  Type 2 diabetes, uncontrolled, with neuropathy ([place]) 12/27/2010  . RESTLESS LEGS SYNDROME 12/27/2010  . Diabetic polyneuropathy ([place]) 12/27/2010  . DM2 (diabetes mellitus, type 2) ([place]) 09/14/2010  . HYPERLIPIDEMIA, MIXED, WITH HIGH HDL 09/14/2010  . Essential hypertension, benign 09/14/2010  . PERSONAL HISTORY OF COLONIC POLYPS 09/14/2010    Past Surgical History:  Procedure Laterality Date  . ABDOMINAL HYSTERECTOMY    . ANAL FISSURE REPAIR    . APPENDECTOMY    . BACK SURGERY    . BREAST SURGERY     BIOPSY  Right Breast -  [facility]  . CARPAL TUNNEL RELEASE     LEFT   . CESAREAN SECTION    . COLONOSCOPY W/ POLYPECTOMY  12/02/2012  .  ESOPHAGOGASTRODUODENOSCOPY  12/02/2012   Gastritis  . HAND SURGERY Bilateral   . HERNIA REPAIR     X 2  . INTRAMEDULLARY (IM) NAIL INTERTROCHANTERIC Left 03/18/2017   Procedure: INTRAMEDULLARY (IM) NAIL INTERTROCHANTRIC;  Surgeon: Rod Can, MD;  Location: [facility];  Service: Orthopedics;  Laterality: Left;  . LIVER BIOPSY  03/28/11   [facility] - Cirrhosis  . SPINAL FUSION    . TONSILLECTOMY    . TUBAL LIGATION      OB History    Gravida Para Term Preterm AB Living   2 2           SAB TAB Ectopic Multiple Live Births                  Obstetric Comments    Menarch age48G2, 63, parity age 29, Use of HRT short period of time       Home Medications    Prior to Admission medications   Medication Sig Start Date End Date Taking? Authorizing Provider  AMBULATORY NON FORMULARY MEDICATION Medication Name: Nitroglycerin ointment 0.125% Apply pea-size amount internally 4 times daily 03/04/15   Pyrtle, Lajuan Lines, MD  aspirin 81 MG tablet Take 81 mg by mouth daily.    [provider]  b complex vitamins capsule Take 1 capsule by mouth daily.     [provider]  BD INSULIN SYRINGE ULTRAFINE 31G X 5/16" 0.3 ML MISC USE  TO INJECT  INSULIN THREE TIMES DAILY 01/10/17   Elayne Snare, MD  buPROPion (WELLBUTRIN XL) 300 MG 24 hr tablet Take 1 tablet by mouth Daily. 07/08/12   [provider]  calcium carbonate (OS-CAL) 600 MG TABS tablet Take 1,200 mg by mouth.    [provider]  Cinnamon 500 MG capsule Take 500 mg by mouth daily.     [provider]  citalopram (CELEXA) 20 MG tablet Take 1 tablet by mouth daily. 03/13/15   [provider]  DULoxetine (CYMBALTA) 60 MG capsule Take 60 mg by mouth daily.    [provider]  enoxaparin (LOVENOX) 40 MG/0.4ML injection Inject 0.4 mLs (40 mg total) into the skin daily. 03/19/17   Swinteck, Aaron Edelman, MD  fluticasone Asencion Islam) 50 MCG/ACT nasal spray Frequency:PRN   Dosage:50   MCG   Instructions:  Note:Dose: 50 MCG 05/04/11   [provider]  furosemide (LASIX) 40 MG tablet Take 60 mg by mouth daily.  02/21/13   [provider]  gabapentin (NEURONTIN) 800 MG tablet 800 mg 3 (three) times daily.  12/21/11   [provider]  glucagon (GLUCAGON EMERGENCY) 1 MG injection Inject 1 mg into the vein once as needed. 05/04/15   Elayne Snare, MD  glucose blood test strip Use  as instructed to check blood sugar 4 times per day Dx: E11.65 09/01/15   Elayne Snare, MD  Grape Seed Extract 50 MG CAPS Take by mouth daily.     [provider]  HYDROcodone-acetaminophen (NORCO/VICODIN) 5-325 MG tablet Take 1-2 tablets by mouth every 6 (six) hours as needed for moderate pain. 03/19/17   Swinteck, Aaron Edelman, MD  insulin aspart protamine- aspart (NOVOLOG MIX 70/30) (70-30) 100 UNIT/ML injection Inject 70-100 Units into the skin 2 (two) times daily with a meal. 100 units in AM and 70 units at night    [provider]  insulin regular human CONCENTRATED (HUMULIN R) 500 UNIT/ML SOLN injection Use three times daily with meals per sliding scale. Max dose 80 units per day. This is equivalent to 400 units of U-100 insulin per day. 04/07/15   Phadke, Karsten Ro, MD  lactulose (CEPHULAC) 20 G packet Take 30 g by mouth 3 (three) times daily.     [provider]  lisinopril (PRINIVIL,ZESTRIL) 20 MG tablet Take 20 mg by mouth every morning. Take in the morning 11/24/11   [provider]  loratadine (CLARITIN) 10 MG tablet Take 10 mg by mouth daily.    [provider]  Magnesium Oxide 400 MG CAPS Take 400 mg by mouth daily.     [provider]  meclizine (ANTIVERT) 12.5 MG tablet Take 12.5 mg by mouth 3 (three) times daily as needed.    [provider]  Milk Thistle 500 MG CAPS Take 1,000 mg by mouth daily.  05/04/11   [provider]  MULTIPLE VITAMIN PO Take 1 tablet by mouth daily.    [provider]  Omega-3 Fatty Acids  (EQL OMEGA 3 FISH OIL) 1400 MG CAPS Take 1 capsule by mouth daily.    [provider]  [facility] DELICA LANCETS 63Z MISC Use to test sugars 4 x daily. 02/19/15   Phadke, Karsten Ro, MD  pantoprazole (PROTONIX) 40 MG tablet Take 40 mg by mouth at bedtime.  05/05/14   [provider]  polyethylene glycol (MIRALAX / GLYCOLAX) packet Take 17 g by mouth 3 (three) times daily.     [provider]  pramipexole (MIRAPEX) 1 MG tablet Take 2 mg by mouth at bedtime.     [provider]  propranolol (INDERAL LA) 60 MG 24 hr capsule Take 60 mg by mouth daily. Take at night 11/23/11   [provider]  spironolactone (ALDACTONE) 50 MG tablet Take 75 mg by mouth daily.  04/30/13   [provider]  traMADol (ULTRAM) 50 MG tablet Take 1 tablet (50 mg total) by mouth every 6 (six) hours as needed for moderate pain or severe pain. 03/21/17   Sheikh, Omair Latif, DO  XIFAXAN 550 MG TABS tablet Take 1 tablet by mouth 2 (two) times daily. 04/06/15   [provider]    Family History Family History  Problem Relation Age of Onset  . Diabetes Mother   . Hypertension Mother   . Heart disease Father   . Hypertension Father   . Hypertension Other   . Heart disease Other   . Lung cancer Maternal Grandfather   . Colon cancer Paternal Grandfather   . Heart disease Maternal Uncle   . Diabetes Sister   . Heart disease Sister   . Thyroid disease Sister   . Diabetes Daughter     Social History Social History  Substance Use Topics  . Smoking status: Never Smoker  . Smokeless tobacco:  Never Used  . Alcohol use No     Allergies   Other; Tape; Oxymorphone; and Barbiturates   Review of Systems Review of Systems  All other systems reviewed and are negative.    Physical Exam Updated Vital Signs BP 115/63   Pulse 66   Temp 98.6 F (37 C) (Oral)   Resp 18   SpO2 96%   Physical Exam  Constitutional: She is oriented to person, place, and time. She  appears well-developed and well-nourished.  Non-toxic appearance. No distress.  HENT:  Head: Normocephalic and atraumatic.  Eyes: Conjunctivae, EOM and lids are normal. Pupils are equal, round, and reactive to light.  Neck: Normal range of motion. Neck supple. No tracheal deviation present. No thyroid mass present.  Cardiovascular: Normal rate, regular rhythm and normal heart sounds.  Exam reveals no gallop.   No murmur heard. Pulmonary/Chest: Effort normal and breath sounds normal. No stridor. No respiratory distress. She has no decreased breath sounds. She has no wheezes. She has no rhonchi. She has no rales.  Abdominal: Soft. Normal appearance and bowel sounds are normal. She exhibits no distension. There is no tenderness. There is no rebound and no CVA tenderness.  Musculoskeletal: Normal range of motion. She exhibits no edema or tenderness.  Right lower extremity is shortened and externally rotated. She is neurovascularly intact in her right foot. No pain to palpation at the right ankle or knee.  Neurological: She is alert and oriented to person, place, and time. No cranial nerve deficit or sensory deficit. GCS eye subscore is 4. GCS verbal subscore is 5. GCS motor subscore is 6.  Right lower extremity strength exam limited by pain  Skin: Skin is warm and dry. No abrasion and no rash noted.  Psychiatric: She has a normal mood and affect. Her speech is normal and behavior is normal.  Nursing note and vitals reviewed.    ED Treatments / Results  Labs (all labs ordered are listed, but only abnormal results are displayed) Labs Reviewed  CBC WITH DIFFERENTIAL/PLATELET  BASIC METABOLIC PANEL    EKG  EKG Interpretation None       Radiology No results found.  Procedures Procedures (including critical care time)  Medications Ordered in ED Medications  0.9 %  sodium chloride infusion (not administered)     Initial Impression / Assessment and Plan / ED Course  I have reviewed  the triage vital signs and the nursing notes.  Pertinent labs & imaging results that were available during my care of the patient were reviewed by me and considered in my medical decision making (see chart for details).     Patient medicated for pain here and has evidence of an acute intertrochanteric fracture. Spoke with Dr. Stann Mainland on call for Dr. Aurelio Jew and requests hospital admission and he would do surgery tomorrow  Final Clinical Impressions(s) / ED Diagnoses   Final diagnoses:  SOB (shortness of breath)    New Prescriptions New Prescriptions   No medications on file     Lacretia Leigh, MD 04/08/17 1559

## 2017-04-08 NOTE — H&P (Signed)
History and Physical    Daisy Larson ION:[id number] DOB: 09-16-1951 DOA: 04/08/2017   PCP: [name], MD   Patient coming from/Resides with: Skilled nursing facility for rehabilitation status post left hip surgery-when medically stable will return to private residence with husband  Admission status: Inpatient/SDU-medically necessary to stay a minimum 2 midnights to rule out impending and/or unexpected changes in physiologic status that may differ from initial evaluation performed in the ER and/or at time of admission. Patient presents with mechanical fall sustaining a right Amin noted femur fracture. Orthopedic service plans operative intervention tomorrow on 6/11. Patient also appears dehydrated and will require IV fluids preoperatively. She has a history of sleep apnea with hypercarbic, metabolic encephalopathy during previous admissions in setting of anesthesia and pain medications therefore preoperatively will be admitted to the stepdown unit to monitor respiratory status closely with administration of narcotic pain medications and muscle relaxants preoperatively. 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 Of note, this patient was recently discharged on 5/23 after undergoing intramedullary nailing for a left comminuted femur fracture. At that time she was discharged to skilled nursing facility for rehabilitation and plans are to return to skilled nursing facility for rehabilitation once medically stable.  Chief Complaint: Fall with right hip pain  HPI: Daisy Larson is a 66 y.o. female with medical history significant for diabetes mellitus on high-dose insulin, obesity, sleep apnea on nocturnal CPAP, left Charcot foot with chronic wounds, macrocytic anemia, NASH cirrhosis with history of hepatic encephalopathy on rifaximin an and lactulose, hypertension, reflux and recent admission for surgical  repair of left comminuted femur fracture after mechanical fall. A she was at the nursing facility when she got out of bed unassisted to obtain a [place] book and fell. She is experiencing muscle spasms and right upper leg pain with external rotation and shortening of the right lower extremity. She was given 150 g of fentanyl by EMS prior to arrival. Imaging obtained in the ER reveals comminuted fracture of the right femur. Orthopedic team has been notified and plans are to proceed with surgical intervention tomorrow on 6/11.  ED Course:  Vital Signs: BP (!) 103/47   Pulse 64   Temp 98.6 F (37 C) (Oral)   Resp 20   SpO2 99%  DG right hip: Acute comminuted intertrochanteric right femur fracture PCXR: Cardiomegaly without acute cardiac pulmonary disease Lab data: Sodium 134, potassium 5.2, chloride 14, CO2 25, glucose 151, BUN 13, creatinine 1.18, calcium 8.8, anion gap 5, white count 4100 with normal differential, hemoglobin 8.5, MCV 100.7, platelets 91,000 Medications and treatments: Morphine former gram IV 1, Zofran 4 mg IV 1  Review of Systems:  In addition to the HPI above,  No Fever-chills, myalgias or other constitutional symptoms No Headache, changes with Vision or hearing, new weakness, tingling, numbness in any extremity, dizziness, dysarthria or word finding difficulty, gait disturbance, tremors or seizure activity No problems swallowing food or Liquids, indigestion/reflux, choking or coughing while eating, abdominal pain with or after eating No Chest pain, Cough or Shortness of Breath, palpitations, orthopnea or DOE No Abdominal pain, N/V, melena,hematochezia, dark tarry stools, constipation No dysuria, malodorous urine, hematuria or flank pain-family reports decreased urinary output for the past 24-48 hours No new skin rashes, lesions, masses or bruises, No new joint pains, aches, swelling or redness No recent unintentional weight gain or loss No polyuria, polydypsia or  polyphagia   Past Medical History:  Diagnosis Date  . Allergy    tape  . Angina    COSTOCONDRITIS    . Anxiety   . Bell's palsy 2014   twice  . Breast cancer ([place]) 01/19/12    right breast lumpectomy/ER/PR positibe,her-2 neg.  . Breast cancer ([place]) 2011  . Bundle branch block   . Cirrhosis ([place])   . Colon polyps 12/02/2012   TUBULAR ADENOMAS (X2) and Hyperplastic (X1)  . Complication of anesthesia    WITH SECON HERNIA REPAIR 2012 [facility]  DIFFICULTY O2 SAT DROPPING ADMIT TO HOSPITAL   . Depression   . Diabetes mellitus   . Diverticulosis   . External hemorrhoids   . Gastritis   . GERD (gastroesophageal reflux disease)    ULCERS  . Goiter   . Headache(784.0)    MIGRAINES  . Heart murmur   . Hepatitis     Did Not have hepatitis but needed Hep A and Hep B injections2012 SPOTS ON LIVER  DR Alonza Bogus  [phone]  . Hernia   . History of stomach ulcers   . Hypertension   . Hyperthyroidism    NODULES ON THYROID  (DR BALEN 37  12-609)  . Iron deficiency anemia 10/16/2012  . Multiple thyroid nodules   . Neuromuscular disorder ([place])    PERIPH NEUROPATHY   . Neuropathy of foot   . Restless legs   . S/P radiation therapy 02/19/12 - 04/01/12   Right Breast: 5000 cgy/25 Fractions with Boost/ 1000 cGy/5 Fractions  . Short of breath on exertion   . Shortness of breath    WITH EXERTION   . Sleep apnea     Past Surgical History:  Procedure Laterality Date  . ABDOMINAL HYSTERECTOMY    . ANAL FISSURE REPAIR    . APPENDECTOMY    . BACK SURGERY    . BREAST SURGERY     BIOPSY  Right Breast -  Sentinel Lymph Nodes  . CARPAL TUNNEL RELEASE     LEFT   . CESAREAN SECTION    . COLONOSCOPY W/ POLYPECTOMY  12/02/2012  . ESOPHAGOGASTRODUODENOSCOPY  12/02/2012   Gastritis  . HAND SURGERY Bilateral   . HERNIA REPAIR     X 2  . INTRAMEDULLARY (IM) NAIL INTERTROCHANTERIC Left 03/18/2017   Procedure: INTRAMEDULLARY (IM) NAIL INTERTROCHANTRIC;  Surgeon: Rod Can, MD;   Location: [facility];  Service: Orthopedics;  Laterality: Left;  . LIVER BIOPSY  03/28/11   [facility] - Cirrhosis  . SPINAL FUSION    . TONSILLECTOMY    . TUBAL LIGATION      Social History   Social History  . Marital status: Married    Spouse name: N/A  . Number of children: N/A  . Years of education: N/A   Occupational History  . Retired     Social History Main Topics  . Smoking status: Never Smoker  . Smokeless tobacco: Never Used  . Alcohol use No  . Drug use: No  . Sexual activity: Not on file   Other Topics Concern  . Not on file   Social History Narrative   Former Chief Strategy Officer. Married to Douglas who is a Theme park manager and who underwent a Heart Transplant at [facility] in May 2011.  2 children and 1 grandchild.      Daily caffeine     Mobility: Mobilize this with assistive devices/rolling walker as well as healthcare personnel Work history: Not obtained   Allergies  Allergen Reactions  . Other Rash  Blisters Per Patient - she has been told she can not have general anesthesia "unless life-threatening"  . Tape   . Oxymorphone     lethargic  . Barbiturates Dermatitis and Other (See Comments)    Drowsiness, patient states that Barbiturates & Narcotics make her extremely sleepy and drowsy 24 Apr 2013 Drowsiness  "sleeps forever"    Family History  Problem Relation Age of Onset  . Diabetes Mother   . Hypertension Mother   . Heart disease Father   . Hypertension Father   . Hypertension Other   . Heart disease Other   . Lung cancer Maternal Grandfather   . Colon cancer Paternal Grandfather   . Heart disease Maternal Uncle   . Diabetes Sister   . Heart disease Sister   . Thyroid disease Sister   . Diabetes Daughter      Prior to Admission medications   Medication Sig Start Date End Date Taking? Authorizing Provider  AMBULATORY NON FORMULARY MEDICATION Medication Name: Nitroglycerin ointment 0.125% Apply pea-size amount internally 4 times  daily 03/04/15   Pyrtle, Lajuan Lines, MD  aspirin 81 MG tablet Take 81 mg by mouth daily.    [provider]  b complex vitamins capsule Take 1 capsule by mouth daily.     [provider]  BD INSULIN SYRINGE ULTRAFINE 31G X 5/16" 0.3 ML MISC USE  TO INJECT  INSULIN THREE TIMES DAILY 01/10/17   Elayne Snare, MD  buPROPion (WELLBUTRIN XL) 300 MG 24 hr tablet Take 1 tablet by mouth Daily. 07/08/12   [provider]  Cinnamon 500 MG capsule Take 500 mg by mouth daily.     [provider]  citalopram (CELEXA) 20 MG tablet Take 1 tablet by mouth daily. 03/13/15   [provider]  DULoxetine (CYMBALTA) 60 MG capsule Take 60 mg by mouth daily.    [provider]  enoxaparin (LOVENOX) 40 MG/0.4ML injection Inject 0.4 mLs (40 mg total) into the skin daily. 03/19/17   Swinteck, Aaron Edelman, MD  fluticasone Asencion Islam) 50 MCG/ACT nasal spray Frequency:PRN   Dosage:50   MCG  Instructions:  Note:Dose: 50 MCG 05/04/11   [provider]  furosemide (LASIX) 40 MG tablet Take 60 mg by mouth daily.  02/21/13   [provider]  gabapentin (NEURONTIN) 800 MG tablet 800 mg 3 (three) times daily.  12/21/11   [provider]  glucagon (GLUCAGON EMERGENCY) 1 MG injection Inject 1 mg into the vein once as needed. 05/04/15   Elayne Snare, MD  glucose blood test strip Use as instructed to check blood sugar 4 times per day Dx: E11.65 09/01/15   Elayne Snare, MD  Grape Seed Extract 50 MG CAPS Take by mouth daily.     [provider]  HYDROcodone-acetaminophen (NORCO/VICODIN) 5-325 MG tablet Take 1-2 tablets by mouth every 6 (six) hours as needed for moderate pain. 03/19/17   Swinteck, Aaron Edelman, MD  insulin aspart protamine- aspart (NOVOLOG MIX 70/30) (70-30) 100 UNIT/ML injection Inject 70-100 Units into the skin 2 (two) times daily with a meal. 100 units in AM and 70 units at night    [provider]  insulin regular human CONCENTRATED (HUMULIN R) 500 UNIT/ML SOLN  injection Use three times daily with meals per sliding scale. Max dose 80 units per day. This is equivalent to 400 units of U-100 insulin per day. 04/07/15   Phadke, Karsten Ro, MD  lactulose (CEPHULAC) 20 G packet Take 30 g by mouth 3 (three) times  daily.     [provider]  lisinopril (PRINIVIL,ZESTRIL) 20 MG tablet Take 20 mg by mouth every morning. Take in the morning 11/24/11   [provider]  loratadine (CLARITIN) 10 MG tablet Take 10 mg by mouth daily.    [provider]  Magnesium Oxide 400 MG CAPS Take 400 mg by mouth daily.     [provider]  meclizine (ANTIVERT) 12.5 MG tablet Take 12.5 mg by mouth 3 (three) times daily as needed.    [provider]  Milk Thistle 500 MG CAPS Take 1,000 mg by mouth daily.  05/04/11   [provider]  MULTIPLE VITAMIN PO Take 1 tablet by mouth daily.    [provider]  Omega-3 Fatty Acids (EQL OMEGA 3 FISH OIL) 1400 MG CAPS Take 1 capsule by mouth daily.    [provider]  [facility] DELICA LANCETS 87O MISC Use to test sugars 4 x daily. 02/19/15   Phadke, Karsten Ro, MD  pantoprazole (PROTONIX) 40 MG tablet Take 40 mg by mouth at bedtime.  05/05/14   [provider]  polyethylene glycol (MIRALAX / GLYCOLAX) packet Take 17 g by mouth 3 (three) times daily.     [provider]  pramipexole (MIRAPEX) 1 MG tablet Take 2 mg by mouth at bedtime.     [provider]  propranolol (INDERAL LA) 60 MG 24 hr capsule Take 60 mg by mouth daily. Take at night 11/23/11   [provider]  spironolactone (ALDACTONE) 50 MG tablet Take 75 mg by mouth daily.  04/30/13   [provider]  traMADol (ULTRAM) 50 MG tablet Take 1 tablet (50 mg total) by mouth every 6 (six) hours as needed for moderate pain or severe pain. 03/21/17   Sheikh, Omair Latif, DO  XIFAXAN 550 MG TABS tablet Take 1 tablet by mouth 2 (two) times daily. 04/06/15   [provider]    Physical  Exam: Vitals:   04/08/17 1344 04/08/17 1345 04/08/17 1530 04/08/17 1545  BP: 115/63 (!) 107/59 (!) 98/43 (!) 103/47  Pulse: 66 65 65 64  Resp: '18  11 20  ' Temp: 98.6 F (37 C)     TempSrc: Oral     SpO2: 96% 96% 100% 99%      Constitutional: NAD, calm, comfortable-Somewhat sleepy after receipt of fentanyl prior to her why vital and morphine in the ER Eyes: PERRL, lids and conjunctivae normal ENMT: Mucous membranes are dry. Posterior pharynx clear of any exudate or lesions. Neck: normal, supple, no masses, no thyromegaly Respiratory: clear to auscultation bilaterally, no wheezing, no crackles. Normal respiratory effort. No accessory muscle use.  Cardiovascular: Regular rate and rhythm, no murmurs / rubs / gallops. No extremity edema. 2+ pedal pulses. No carotid bruits.  Abdomen: no tenderness, no masses palpated. No hepatosplenomegaly. Bowel sounds positive.  Musculoskeletal: no clubbing / cyanosis. No joint deformity upper and lower extremities except for shortening of right lower extremity with internal rotation. Good ROM except for right lower extremity, no contractures. Normal muscle tone-limited exam right lower extremities secondary to pain with noted involuntary muscle spasms right lower extremity.  Skin: no rashes, lesions, ulcers. No induration Neurologic: CN 2-12 grossly intact. Sensation intact, DTR normal-right lower extremity not tested. Strength 5/5 x all 4 extremities-right lower extremity not tested in setting of acute femur fracture.  Psychiatric: Normal judgment and insight. Awakens to voice and is oriented x 3. Normal mood.    Labs on Admission: I have personally  reviewed following labs and imaging studies  CBC:  Recent Labs Lab 04/08/17 1535  WBC 4.1  NEUTROABS 3.0  HGB 8.5*  HCT 27.9*  MCV 100.7*  PLT 91*   Basic Metabolic Panel:  Recent Labs Lab 04/08/17 1535  NA 134*  K 5.2*  CL 104  CO2 25  GLUCOSE 151*  BUN 13  CREATININE 1.18*  CALCIUM 8.8*    GFR: CrCl cannot be calculated (Unknown ideal weight.). Liver Function Tests: No results for input(s): AST, ALT, ALKPHOS, BILITOT, PROT, ALBUMIN in the last 168 hours. No results for input(s): LIPASE, AMYLASE in the last 168 hours. No results for input(s): AMMONIA in the last 168 hours. Coagulation Profile: No results for input(s): INR, PROTIME in the last 168 hours. Cardiac Enzymes: No results for input(s): CKTOTAL, CKMB, CKMBINDEX, TROPONINI in the last 168 hours. BNP (last 3 results) No results for input(s): PROBNP in the last 8760 hours. HbA1C: No results for input(s): HGBA1C in the last 72 hours. CBG: No results for input(s): GLUCAP in the last 168 hours. Lipid Profile: No results for input(s): CHOL, HDL, LDLCALC, TRIG, CHOLHDL, LDLDIRECT in the last 72 hours. Thyroid Function Tests: No results for input(s): TSH, T4TOTAL, FREET4, T3FREE, THYROIDAB in the last 72 hours. Anemia Panel: No results for input(s): VITAMINB12, FOLATE, FERRITIN, TIBC, IRON, RETICCTPCT in the last 72 hours. Urine analysis:    Component Value Date/Time   COLORURINE YELLOW 03/16/2017 2348   APPEARANCEUR CLEAR 03/16/2017 2348   LABSPEC 1.011 03/16/2017 2348   PHURINE 6.0 03/16/2017 2348   GLUCOSEU NEGATIVE 03/16/2017 2348   GLUCOSEU NEGATIVE 09/14/2015 0808   HGBUR NEGATIVE 03/16/2017 2348   BILIRUBINUR NEGATIVE 03/16/2017 2348   KETONESUR NEGATIVE 03/16/2017 2348   PROTEINUR NEGATIVE 03/16/2017 2348   UROBILINOGEN 1.0 09/14/2015 0808   NITRITE NEGATIVE 03/16/2017 2348   LEUKOCYTESUR NEGATIVE 03/16/2017 2348   Sepsis Labs: '@LABRCNTIP' (procalcitonin:4,lacticidven:4) )No results found for this or any previous visit (from the past 240 hour(s)).   Radiological Exams on Admission: Dg Chest 1 View  Result Date: 04/08/2017 CLINICAL DATA:  Preoperative examination prior to right hip surgery. EXAM: CHEST 1 VIEW COMPARISON:  03/16/2017 FINDINGS: Cardiomegaly noted. There is no evidence of focal  airspace disease, pulmonary edema, suspicious pulmonary nodule/mass, pleural effusion, or pneumothorax. No acute bony abnormalities are identified. Remote left rib fractures again noted. IMPRESSION: Cardiomegaly without evidence of acute cardiopulmonary disease. Electronically Signed   By: Margarette Canada M.D.   On: 04/08/2017 15:43   Dg Hip Unilat W Or Wo Pelvis 2-3 Views Right  Result Date: 04/08/2017 CLINICAL DATA:  Acute right hip pain following fall. EXAM: DG HIP (WITH OR WITHOUT PELVIS) 2-3V RIGHT COMPARISON:  03/18/2017 FINDINGS: An acute comminuted intertrochanteric right femur fracture is noted with lesser trochanteric fragment displaced by 6 mm. There is no evidence of subluxation or dislocation. Internal fixation of a left intertrochanteric femur fracture is again noted. IMPRESSION: Acute comminuted intratrochanteric right femur fracture. Electronically Signed   By: Margarette Canada M.D.   On: 04/08/2017 15:34    EKG: (Independently reviewed) Sinus rhythm with ventricular rate 63 bpm, QTC 425 ms, incomplete left bundle branch block, no acute ischemic changes, borderline peaked T waves  Assessment/Plan Principal Problem:   Closed displaced comminuted fracture of shaft of right femur  -Presents with mechanical fall after unassisted ambulation resulting in femur fracture -Orthopedic team consulted and plans are to pursue surgical intervention on 6/10 -Hip fracture or set initiated -NPO after midnight -OxyIR for moderate pain and IV  morphine for severe pain-history of sleep apnea and hypercarbia/encephalopathy postoperatively during previous admission therefore will admit to stepdown so respiratory status can be monitored closely pre-and postoperatively and in setting of need for IV muscle relaxants and narcotics (see below) -Operative orders per surgical team -Bed rest without weightbearing -Anticipate will return to skilled nursing facility when medically stable for discharge  Active Problems:    Closed left comminuted femur fracture  s/p intramedullary nailing  -Discharged on 5/23 to skilled nursing facility and was doing well with PT but reports was not yet to be ambulating independently although she did get up unassisted today as described above -PT/OT postoperatively    Dehydration with hyponatremia -Patient presents with suboptimal blood pressure, mild hyponatremia and mild hypokalemia -IV fluids at 125/hr -Follow labs -Hold preadmission Aldactone and Lasix -K+ only mildly elevated so no indication for Kayexalate -Family reports decreased urinary output for at least 24 hours and has not had urinary output since arrival to the ER therefore check bladder scan    Liver cirrhosis secondary to NASH  -History of symptomatic hepatic encephalopathy during previous admission -Ammonia level -Continue Rifaximin -Hold Chronulac preoperatively given difficulty in getting on and off bedpan with acute femur fracture -Blood pressure suboptimal so hold Inderal    Sleep apnea -Continue nocturnal CPAP and have available prn in the setting of possible sedation from needed pain medications    Diabetes mellitus -Current CBG controlled -On sliding scale concentrated R 500 insulin prior to admission-we'll utilize sliding scale with normal concentrated insulin at this time -On 70/30 insulin at home-we can give evening mealtime dose but will be NPO after midnight so will hold a.m. dose preoperatively -Hemoglobin A1c was 6.6 on 5/19    Hypertension -Current blood pressure suboptimal -Hold preadmission lisinopril    GERD (gastroesophageal reflux disease) -Continue Protonix    Charcot's joint of foot, left w/ chronic wounds -Chronic issue followed at [facility] -Presumed hardware infection and has had intra-articular antibody beads placed -Prior to last admission family was using Betadine for wound care at home -She is to follow-up with her primary surgeon Dr. [name]  at [facility] in [place]    Thrombocytopenia  -Was on Lovenox for DVT prophylaxis at nursing facility?? -Current platelets less than 100,000 so we'll hold    Anemia, macrocytic -Hemoglobin stable and at baseline around 8    Restless leg syndrome -Continue Mirapex      DVT prophylaxis: SCDs to left lower extremity only  Code Status: Full Family Communication: Daughter and husband at bedside Disposition Plan: [place] called: Orthopedic/S[place]tek    Samella Parr ANP-BC [facility] Pager [phone]   If 7PM-7AM, please contact night-coverage [email or web address] Password [facility]  04/08/2017, 4:51 PM

## 2017-04-08 NOTE — Progress Notes (Signed)
Ortho Note  I have discussed this patient with her surgeon, Dr. [name], he will assume care tomorrow and plan for operative fixation of the right hip tomorrow as well.  Victorino December, MD

## 2017-04-08 NOTE — ED Notes (Signed)
Patient transported to X-ray 

## 2017-04-08 NOTE — Progress Notes (Signed)
Pt has scab on left inside ankle and small skin tear above it, family states pt had ankle surgery and scab is from it healing. Pt has red blanchable, dry skin on bilateral buttocks.

## 2017-04-08 NOTE — ED Triage Notes (Signed)
Pt arrives via [facility] from [facility], ems reports pt had recent left femur repair, was trying to walk today when she had a fall. Denies loc but arrives with shortened/rotated right hip. Received 115mcg fentanyl pta. Pt a/ox4.

## 2017-04-09 ENCOUNTER — Inpatient Hospital Stay (HOSPITAL_COMMUNITY): Payer: Medicare Other | Admitting: Anesthesiology

## 2017-04-09 ENCOUNTER — Encounter (HOSPITAL_COMMUNITY)
Admission: EM | Disposition: A | Discharge status: Skilled Nursing Facility | Payer: Self-pay | Source: Home / Self Care | Attending: Internal Medicine

## 2017-04-09 ENCOUNTER — Inpatient Hospital Stay (HOSPITAL_COMMUNITY): Payer: Medicare Other

## 2017-04-09 ENCOUNTER — Encounter (HOSPITAL_COMMUNITY): Payer: Self-pay | Admitting: Surgery

## 2017-04-09 DIAGNOSIS — S72351A Displaced comminuted fracture of shaft of right femur, initial encounter for closed fracture: Secondary | ICD-10-CM

## 2017-04-09 DIAGNOSIS — S72141A Displaced intertrochanteric fracture of right femur, initial encounter for closed fracture: Secondary | ICD-10-CM | POA: Diagnosis present

## 2017-04-09 HISTORY — PX: INTRAMEDULLARY (IM) NAIL INTERTROCHANTERIC: SHX5875

## 2017-04-09 LAB — GLUCOSE, CAPILLARY
GLUCOSE-CAPILLARY: 189 mg/dL — AB (ref 65–99)
Glucose-Capillary: 192 mg/dL — ABNORMAL HIGH (ref 65–99)
Glucose-Capillary: 144 mg/dL — ABNORMAL HIGH (ref 65–99)
Glucose-Capillary: 219 mg/dL — ABNORMAL HIGH (ref 65–99)

## 2017-04-09 LAB — CBC
HEMATOCRIT: 27.3 % — AB (ref 36.0–46.0)
HEMOGLOBIN: 8 g/dL — AB (ref 12.0–15.0)
MCH: 29.9 pg (ref 26.0–34.0)
MCHC: 29.3 g/dL — ABNORMAL LOW (ref 30.0–36.0)
MCV: 101.9 fL — ABNORMAL HIGH (ref 78.0–100.0)
Platelets: 97 10*3/uL — ABNORMAL LOW (ref 150–400)
RBC: 2.68 MIL/uL — AB (ref 3.87–5.11)
RDW: 15.9 % — ABNORMAL HIGH (ref 11.5–15.5)
WBC: 5.2 10*3/uL (ref 4.0–10.5)

## 2017-04-09 LAB — BASIC METABOLIC PANEL
ANION GAP: 4 — AB (ref 5–15)
BUN: 17 mg/dL (ref 6–20)
CHLORIDE: 103 mmol/L (ref 101–111)
CO2: 28 mmol/L (ref 22–32)
Calcium: 8 mg/dL — ABNORMAL LOW (ref 8.9–10.3)
Creatinine, Ser: 1.25 mg/dL — ABNORMAL HIGH (ref 0.44–1.00)
GFR calc non Af Amer: 44 mL/min — ABNORMAL LOW (ref >60)
GFR, EST AFRICAN AMERICAN: 51 mL/min — AB (ref >60)
Glucose, Bld: 169 mg/dL — ABNORMAL HIGH (ref 65–99)
POTASSIUM: 6 mmol/L — AB (ref 3.5–5.1)
Sodium: 135 mmol/L (ref 135–145)

## 2017-04-09 LAB — PREPARE RBC (CROSSMATCH)

## 2017-04-09 LAB — POTASSIUM
POTASSIUM: 5.8 mmol/L — AB (ref 3.5–5.1)
Potassium: 5.8 mmol/L — ABNORMAL HIGH (ref 3.5–5.1)

## 2017-04-09 LAB — ABO/RH: ABO/RH(D): A POS

## 2017-04-09 SURGERY — FIXATION, FRACTURE, INTERTROCHANTERIC, WITH INTRAMEDULLARY ROD
Anesthesia: General | Site: Leg Upper | Laterality: Right

## 2017-04-09 MED ORDER — METOCLOPRAMIDE HCL 5 MG PO TABS: 5.0000 mg | ORAL_TABLET | Freq: Three times a day (TID) | ORAL | Status: DC | PRN

## 2017-04-09 MED ORDER — SODIUM CHLORIDE 0.9 % IV SOLN: Freq: Once | INTRAVENOUS | Status: DC

## 2017-04-09 MED ORDER — LIDOCAINE 2% (20 MG/ML) 5 ML SYRINGE
INTRAMUSCULAR | Status: DC | PRN
  Administered 2017-04-09: 100 mg via INTRAVENOUS

## 2017-04-09 MED ORDER — HYDROCODONE-ACETAMINOPHEN 5-325 MG PO TABS
1.0000 | ORAL_TABLET | Freq: Four times a day (QID) | ORAL | Status: DC | PRN
  Filled 2017-04-09: qty 2

## 2017-04-09 MED ORDER — MIDAZOLAM HCL 2 MG/2ML IJ SOLN
INTRAMUSCULAR | Status: AC
  Filled 2017-04-09: qty 2

## 2017-04-09 MED ORDER — SODIUM CHLORIDE 0.9 % IV BOLUS (SEPSIS)
750.0000 mL | Freq: Once | INTRAVENOUS | Status: AC
  Administered 2017-04-09: 750 mL via INTRAVENOUS

## 2017-04-09 MED ORDER — ONDANSETRON HCL 4 MG/2ML IJ SOLN: 4.0000 mg | Freq: Four times a day (QID) | INTRAMUSCULAR | Status: DC | PRN

## 2017-04-09 MED ORDER — VASOPRESSIN 20 UNIT/ML IV SOLN
INTRAVENOUS | Status: AC
  Filled 2017-04-09: qty 1

## 2017-04-09 MED ORDER — 0.9 % SODIUM CHLORIDE (POUR BTL) OPTIME
TOPICAL | Status: DC | PRN
  Administered 2017-04-09: 1000 mL

## 2017-04-09 MED ORDER — FENTANYL CITRATE (PF) 100 MCG/2ML IJ SOLN
25.0000 ug | INTRAMUSCULAR | Status: DC | PRN
  Administered 2017-04-09: 50 ug via INTRAVENOUS
  Administered 2017-04-09: 25 ug via INTRAVENOUS

## 2017-04-09 MED ORDER — PHENOL 1.4 % MT LIQD: 1.0000 | OROMUCOSAL | Status: DC | PRN

## 2017-04-09 MED ORDER — EPHEDRINE SULFATE-NACL 50-0.9 MG/10ML-% IV SOSY
PREFILLED_SYRINGE | INTRAVENOUS | Status: DC | PRN
  Administered 2017-04-09: 50 mg via INTRAVENOUS
  Administered 2017-04-09: 25 mg via INTRAVENOUS

## 2017-04-09 MED ORDER — PROPOFOL 10 MG/ML IV BOLUS
INTRAVENOUS | Status: DC | PRN
Start: 2017-04-09 — End: 2017-04-09
  Administered 2017-04-09: 100 mg via INTRAVENOUS

## 2017-04-09 MED ORDER — DEXTROSE 50 % IV SOLN
1.0000 | Freq: Once | INTRAVENOUS | Status: AC
  Administered 2017-04-09: 50 mL via INTRAVENOUS
  Filled 2017-04-09: qty 50

## 2017-04-09 MED ORDER — SODIUM CHLORIDE 0.9 % IV SOLN
40.0000 ug/min | INTRAVENOUS | Status: DC
  Administered 2017-04-09: 40 ug/min via INTRAVENOUS
  Filled 2017-04-09: qty 1

## 2017-04-09 MED ORDER — LACTULOSE 10 GM/15ML PO SOLN
75.0000 g | Freq: Three times a day (TID) | ORAL | Status: DC
  Administered 2017-04-09 – 2017-04-14 (×8): 75 g via ORAL
  Filled 2017-04-09 (×11): qty 120

## 2017-04-09 MED ORDER — INSULIN GLARGINE 100 UNIT/ML ~~LOC~~ SOLN
40.0000 [IU] | Freq: Every day | SUBCUTANEOUS | Status: DC
  Administered 2017-04-09: 40 [IU] via SUBCUTANEOUS
  Filled 2017-04-09: qty 0.4

## 2017-04-09 MED ORDER — PROPOFOL 10 MG/ML IV BOLUS
INTRAVENOUS | Status: AC
  Filled 2017-04-09: qty 20

## 2017-04-09 MED ORDER — FUROSEMIDE 10 MG/ML IJ SOLN
20.0000 mg | Freq: Once | INTRAMUSCULAR | Status: AC
  Administered 2017-04-09: 20 mg via INTRAVENOUS
  Filled 2017-04-09: qty 2

## 2017-04-09 MED ORDER — ACETAMINOPHEN 650 MG RE SUPP: 650.0000 mg | Freq: Four times a day (QID) | RECTAL | Status: DC | PRN

## 2017-04-09 MED ORDER — ENOXAPARIN SODIUM 40 MG/0.4ML ~~LOC~~ SOLN
40.0000 mg | SUBCUTANEOUS | Status: DC
  Administered 2017-04-10 – 2017-04-14 (×4): 40 mg via SUBCUTANEOUS
  Filled 2017-04-09 (×4): qty 0.4

## 2017-04-09 MED ORDER — METHOCARBAMOL 500 MG PO TABS
500.0000 mg | ORAL_TABLET | Freq: Four times a day (QID) | ORAL | Status: DC | PRN
  Administered 2017-04-09 – 2017-04-11 (×3): 500 mg via ORAL
  Filled 2017-04-09 (×3): qty 1

## 2017-04-09 MED ORDER — GLYCOPYRROLATE 0.2 MG/ML IV SOSY
PREFILLED_SYRINGE | INTRAVENOUS | Status: DC | PRN
  Administered 2017-04-09: .2 mg via INTRAVENOUS

## 2017-04-09 MED ORDER — METOCLOPRAMIDE HCL 5 MG/ML IJ SOLN: 5.0000 mg | Freq: Three times a day (TID) | INTRAMUSCULAR | Status: DC | PRN

## 2017-04-09 MED ORDER — METHOCARBAMOL 1000 MG/10ML IJ SOLN
500.0000 mg | Freq: Four times a day (QID) | INTRAMUSCULAR | Status: DC | PRN
  Filled 2017-04-09: qty 5

## 2017-04-09 MED ORDER — INSULIN ASPART 100 UNIT/ML IV SOLN
10.0000 [IU] | Freq: Once | INTRAVENOUS | Status: AC
  Administered 2017-04-09: 10 [IU] via INTRAVENOUS

## 2017-04-09 MED ORDER — ONDANSETRON HCL 4 MG PO TABS: 4.0000 mg | ORAL_TABLET | Freq: Four times a day (QID) | ORAL | Status: DC | PRN

## 2017-04-09 MED ORDER — MEPERIDINE HCL 25 MG/ML IJ SOLN: 6.2500 mg | INTRAMUSCULAR | Status: DC | PRN

## 2017-04-09 MED ORDER — VASOPRESSIN 20 UNIT/ML IV SOLN
INTRAVENOUS | Status: DC | PRN
  Administered 2017-04-09 (×2): 2 [IU] via INTRAVENOUS
  Administered 2017-04-09: 1 [IU] via INTRAVENOUS
  Administered 2017-04-09 (×2): 2 [IU] via INTRAVENOUS
  Administered 2017-04-09: 1 [IU] via INTRAVENOUS
  Administered 2017-04-09: 2 [IU] via INTRAVENOUS

## 2017-04-09 MED ORDER — ALBUMIN HUMAN 5 % IV SOLN
INTRAVENOUS | Status: AC
  Administered 2017-04-09: 12.5 g
  Filled 2017-04-09: qty 250

## 2017-04-09 MED ORDER — ROCURONIUM BROMIDE 10 MG/ML (PF) SYRINGE
PREFILLED_SYRINGE | INTRAVENOUS | Status: DC | PRN
  Administered 2017-04-09: 30 mg via INTRAVENOUS
  Administered 2017-04-09 (×2): 20 mg via INTRAVENOUS

## 2017-04-09 MED ORDER — CEFAZOLIN SODIUM-DEXTROSE 2-4 GM/100ML-% IV SOLN
2.0000 g | Freq: Four times a day (QID) | INTRAVENOUS | Status: AC
  Administered 2017-04-09 – 2017-04-10 (×2): 2 g via INTRAVENOUS
  Filled 2017-04-09 (×2): qty 100

## 2017-04-09 MED ORDER — FENTANYL CITRATE (PF) 250 MCG/5ML IJ SOLN
INTRAMUSCULAR | Status: AC
  Filled 2017-04-09: qty 5

## 2017-04-09 MED ORDER — ACETAMINOPHEN 325 MG PO TABS: 650.0000 mg | ORAL_TABLET | Freq: Four times a day (QID) | ORAL | Status: DC | PRN

## 2017-04-09 MED ORDER — SUGAMMADEX SODIUM 200 MG/2ML IV SOLN
INTRAVENOUS | Status: DC | PRN
  Administered 2017-04-09: 500 mg via INTRAVENOUS

## 2017-04-09 MED ORDER — PHENYLEPHRINE 40 MCG/ML (10ML) SYRINGE FOR IV PUSH (FOR BLOOD PRESSURE SUPPORT)
PREFILLED_SYRINGE | INTRAVENOUS | Status: DC | PRN
  Administered 2017-04-09 (×2): 200 ug via INTRAVENOUS

## 2017-04-09 MED ORDER — SODIUM POLYSTYRENE SULFONATE 15 GM/60ML PO SUSP: 30.0000 g | Freq: Once | ORAL | Status: DC

## 2017-04-09 MED ORDER — SUGAMMADEX SODIUM 500 MG/5ML IV SOLN
INTRAVENOUS | Status: AC
  Filled 2017-04-09: qty 5

## 2017-04-09 MED ORDER — FENTANYL CITRATE (PF) 100 MCG/2ML IJ SOLN
INTRAMUSCULAR | Status: AC
  Filled 2017-04-09: qty 2

## 2017-04-09 MED ORDER — ALBUMIN HUMAN 5 % IV SOLN: 12.5000 g | Freq: Once | INTRAVENOUS | Status: DC

## 2017-04-09 MED ORDER — ONDANSETRON HCL 4 MG/2ML IJ SOLN
INTRAMUSCULAR | Status: DC | PRN
  Administered 2017-04-09: 4 mg via INTRAVENOUS

## 2017-04-09 MED ORDER — MENTHOL 3 MG MT LOZG: 1.0000 | LOZENGE | OROMUCOSAL | Status: DC | PRN

## 2017-04-09 SURGICAL SUPPLY — 52 items
ALCOHOL ISOPROPYL (RUBBING) (MISCELLANEOUS) ×3 IMPLANT
BIT DRILL 4.3MMS DISTAL GRDTED (BIT) ×1 IMPLANT
BNDG COHESIVE 4X5 TAN STRL (GAUZE/BANDAGES/DRESSINGS) IMPLANT
CHLORAPREP W/TINT 26ML (MISCELLANEOUS) ×3 IMPLANT
COVER PERINEAL POST (MISCELLANEOUS) ×3 IMPLANT
COVER SURGICAL LIGHT HANDLE (MISCELLANEOUS) ×3 IMPLANT
DERMABOND ADVANCED (GAUZE/BANDAGES/DRESSINGS) ×6
DERMABOND ADVANCED .7 DNX12 (GAUZE/BANDAGES/DRESSINGS) ×3 IMPLANT
DRAPE C-ARM 42X72 X-RAY (DRAPES) ×3 IMPLANT
DRAPE C-ARMOR (DRAPES) ×3 IMPLANT
DRAPE HALF SHEET 40X57 (DRAPES) ×3 IMPLANT
DRAPE IMP U-DRAPE 54X76 (DRAPES) ×6 IMPLANT
DRAPE STERI IOBAN 125X83 (DRAPES) ×3 IMPLANT
DRAPE U-SHAPE 47X51 STRL (DRAPES) ×6 IMPLANT
DRAPE UNIVERSAL PACK (DRAPES) IMPLANT
DRILL 4.3MMS DISTAL GRADUATED (BIT) ×3
DRSG MEPILEX BORDER 4X4 (GAUZE/BANDAGES/DRESSINGS) ×6 IMPLANT
DRSG MEPILEX BORDER 4X8 (GAUZE/BANDAGES/DRESSINGS) ×3 IMPLANT
DRSG PAD ABDOMINAL 8X10 ST (GAUZE/BANDAGES/DRESSINGS) IMPLANT
ELECT PENCIL ROCKER SW 15FT (MISCELLANEOUS) ×3 IMPLANT
ELECT REM PT RETURN 9FT ADLT (ELECTROSURGICAL) ×3
ELECTRODE REM PT RTRN 9FT ADLT (ELECTROSURGICAL) ×1 IMPLANT
FACESHIELD WRAPAROUND (MASK) ×3 IMPLANT
GLOVE BIO SURGEON STRL SZ8.5 (GLOVE) ×6 IMPLANT
GLOVE BIOGEL PI IND STRL 8.5 (GLOVE) ×1 IMPLANT
GLOVE BIOGEL PI INDICATOR 8.5 (GLOVE) ×2
GLOVE SURG SS PI 8.0 STRL IVOR (GLOVE) ×6 IMPLANT
GOWN STRL REUS W/ TWL LRG LVL3 (GOWN DISPOSABLE) ×2 IMPLANT
GOWN STRL REUS W/TWL 2XL LVL3 (GOWN DISPOSABLE) ×3 IMPLANT
GOWN STRL REUS W/TWL LRG LVL3 (GOWN DISPOSABLE) ×4
GUIDEWIRE BALL NOSE 100CM (WIRE) ×3 IMPLANT
HFN LAG SCREW 10.5MM X 115MM (Orthopedic Implant) ×3 IMPLANT
KIT ROOM TURNOVER OR (KITS) ×3 IMPLANT
MANIFOLD NEPTUNE II (INSTRUMENTS) ×3 IMPLANT
MARKER SKIN DUAL TIP RULER LAB (MISCELLANEOUS) ×3 IMPLANT
NAIL HIP FRACTURE 11X380MM (Nail) ×3 IMPLANT
NS IRRIG 1000ML POUR BTL (IV SOLUTION) ×3 IMPLANT
PACK GENERAL/GYN (CUSTOM PROCEDURE TRAY) ×3 IMPLANT
PAD ARMBOARD 7.5X6 YLW CONV (MISCELLANEOUS) ×6 IMPLANT
PADDING CAST ABS 4INX4YD NS (CAST SUPPLIES)
PADDING CAST ABS COTTON 4X4 ST (CAST SUPPLIES) IMPLANT
PIN GUIDE 3.2 903003004 (MISCELLANEOUS) ×3 IMPLANT
SCREW BONE CORTICAL 5.0X44 (Screw) ×3 IMPLANT
SCREWDRIVER HEX TIP 3.5MM (MISCELLANEOUS) ×3 IMPLANT
SUT MNCRL AB 3-0 PS2 18 (SUTURE) ×3 IMPLANT
SUT MNCRL AB 3-0 PS2 27 (SUTURE) ×3 IMPLANT
SUT MON AB 2-0 CT1 27 (SUTURE) ×3 IMPLANT
SUT MON AB 2-0 CT1 36 (SUTURE) ×3 IMPLANT
SUT VIC AB 1 CT1 27 (SUTURE) ×2
SUT VIC AB 1 CT1 27XBRD ANBCTR (SUTURE) ×1 IMPLANT
TOWEL OR 17X24 6PK STRL BLUE (TOWEL DISPOSABLE) ×3 IMPLANT
TOWEL OR 17X26 10 PK STRL BLUE (TOWEL DISPOSABLE) ×3 IMPLANT

## 2017-04-09 NOTE — Progress Notes (Addendum)
PROGRESS NOTE    Daisy Larson  WER:[id number] DOB: 1950/11/23 DOA: 04/08/2017 PCP: [name], MD     Brief Narrative:  Daisy Larson is a 66 y.o. 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 She was at the nursing facility when she got out of bed unassisted to obtain a [place] book and fell. She is experiencing muscle spasms and right upper leg pain with external rotation and shortening of the right lower extremity. She was given 150 g of fentanyl by EMS prior to arrival. Imaging obtained in the ER reveals comminuted fracture of the right femur. Orthopedic team has been notified and plans are to proceed with surgical intervention on 6/11.  Assessment & Plan:   Principal Problem:   Closed displaced comminuted fracture of shaft of right femur ([place]) Active Problems:   Liver cirrhosis secondary to NASH (HCC)   GERD (gastroesophageal reflux disease)   Sleep apnea   Closed left comminuted femur fracture  s/p intramedullary nailing (HCC)   Charcot's joint of foot, left w/ chronic wounds   Diabetes mellitus   Hypertension   Thrombocytopenia (HCC)   Anemia, macrocytic   Dehydration with hyponatremia  Closed displaced comminuted fracture of shaft of right femur  -Presents with mechanical fall after unassisted ambulation resulting in femur fracture -Orthopedic team consulted and plans are to pursue surgical intervention on 6/11 -Hold aspirin preoperatively -PT/OT postoperatively  Hyperkalemia -Received insulin/dextrose this morning, repeat K 6 --> 5.8. Will give 1 dose of lasix and repeat lab this afternoon prior to OR  -EKG reviewed  independently, NSR without acute ST or T wave change   CKD stage 3 -Baseline Cr 1.1-1.3 -Stable   Liver cirrhosis secondary to NASH, with hx hepatic encephalopathy  -Ammonia level 62  -Continue Rifaximin, lactulose  -Hold preadmission lasix, spironolactone due to low BP   OSA -Continue nocturnal CPAP   Diabetes mellitus -Hemoglobin A1c was 6.6 on 5/19 -Lantus + Novolog SSI  -DM coordinator consult   Hypertension -Hold preadmission lisinopril, lasix, spironolactone due to low BP   GERD  -Continue Protonix  Charcot's joint of foot, left w/ chronic wounds -Chronic issue followed at [facility] -Presumed hardware infection and has had intra-articular antibody beads placed -Prior to last admission family was using Betadine for wound care at home -She is to follow-up with her primary surgeon Dr. [name] at [place] in [place]  Thrombocytopenia  -Was on Lovenox for DVT prophylaxis at nursing facility -Stable, monitor   Anemia, macrocytic -Hemoglobin stable and at baseline around 8  Restless leg syndrome -Continue Mirapex  Depression -Continue wellbutrin, cymbalta     DVT prophylaxis: SCDs Code Status: Full Family Communication: No family at bedside Disposition Plan: pending surgical intervention   Consultants:   Orthopedic surgery  Procedures:   None  Antimicrobials:  Anti-infectives    Start     Dose/Rate Route Frequency Ordered Stop   04/09/17 0600  ceFAZolin (ANCEF) IVPB 2g/100 mL premix     2 g 200 mL/hr over 30 Minutes Intravenous On call to O.R. 04/08/17 1733 04/10/17 0559   04/08/17 2200  rifaximin (XIFAXAN) tablet 550 mg     550 mg Oral 2 times daily 04/08/17 1737  Subjective: Patient is quite sleepy and sleeping comfortably in bed on nasal CPAP. She is arousable to voice but falls asleep and is unable to stay awake for questions. Per nursing, no complaints or events overnight. She did receive pain  medications overnight and early this morning.    Objective: Vitals:   04/09/17 0600 04/09/17 0626 04/09/17 0640 04/09/17 0700  BP:  (!) 102/43 (!) 102/43 (!) 88/48  Pulse: 71 67 74 67  Resp: 19 18 16 11   Temp:    99.1 F (37.3 C)  TempSrc:    Oral  SpO2: 97% 99% 99% 94%  Weight:      Height:        Intake/Output Summary (Last 24 hours) at 04/09/17 1125 Last data filed at 04/09/17 0700  Gross per 24 hour  Intake          1364.58 ml  Output             1375 ml  Net           -10.42 ml   Filed Weights   04/08/17 1733  Weight: 122.3 kg (269 lb 10 oz)    Examination: General exam: Appears calm and comfortable  Respiratory system: Clear to auscultation. Respiratory effort normal. Cardiovascular system: S1 & S2 heard, RRR. No JVD, murmurs, rubs, gallops or clicks. No pedal edema. Gastrointestinal system: Abdomen is nondistended, soft and nontender. No organomegaly or masses felt. Normal bowel sounds heard. Central nervous system: Unable to examine as patient sleeping/somnolent  Extremities: No edema or cyanosis  Skin: No rashes, lesions or ulcers on visible skin   Data Reviewed: I have personally reviewed following labs and imaging studies  CBC:  Recent Labs Lab 04/08/17 1535 04/09/17 0332  WBC 4.1 5.2  NEUTROABS 3.0  --   HGB 8.5* 8.0*  HCT 27.9* 27.3*  MCV 100.7* 101.9*  PLT 91* 97*   Basic Metabolic Panel:  Recent Labs Lab 04/08/17 1535 04/09/17 0332 04/09/17 0814  NA 134* 135  --   K 5.2* 6.0* 5.8*  CL 104 103  --   CO2 25 28  --   GLUCOSE 151* 169*  --   BUN 13 17  --   CREATININE 1.18* 1.25*  --   CALCIUM 8.8* 8.0*  --    GFR: Estimated Creatinine Clearance: 61 mL/min (A) (by C-G formula based on SCr of 1.25 mg/dL (H)). Liver Function Tests: No results for input(s): AST, ALT, ALKPHOS, BILITOT, PROT, ALBUMIN in the last 168 hours. No results for input(s): LIPASE, AMYLASE in the last 168 hours.  Recent Labs Lab 04/08/17 1800  AMMONIA 62*    Coagulation Profile: No results for input(s): INR, PROTIME in the last 168 hours. Cardiac Enzymes: No results for input(s): CKTOTAL, CKMB, CKMBINDEX, TROPONINI in the last 168 hours. BNP (last 3 results) No results for input(s): PROBNP in the last 8760 hours. HbA1C: No results for input(s): HGBA1C in the last 72 hours. CBG:  Recent Labs Lab 04/08/17 1841 04/08/17 2105 04/09/17 0814  GLUCAP 115* 129* 189*   Lipid Profile: No results for input(s): CHOL, HDL, LDLCALC, TRIG, CHOLHDL, LDLDIRECT in the last 72 hours. Thyroid Function Tests: No results for input(s): TSH, T4TOTAL, FREET4, T3FREE, THYROIDAB in the last 72 hours. Anemia Panel: No results for input(s): VITAMINB12, FOLATE, FERRITIN, TIBC, IRON, RETICCTPCT in the last 72 hours. Sepsis Labs: No results for input(s): PROCALCITON, LATICACIDVEN in the last 168 hours.  Recent Results (from the past 240 hour(s))  MRSA PCR Screening  Status: None   Collection Time: 04/08/17  6:00 PM  Result Value Ref Range Status   MRSA by PCR NEGATIVE NEGATIVE Final    Comment:        The GeneXpert MRSA Assay (FDA approved for NASAL specimens only), is one component of a comprehensive MRSA colonization surveillance program. It is not intended to diagnose MRSA infection nor to guide or monitor treatment for MRSA infections.        Radiology Studies: Dg Chest 1 View  Result Date: 04/08/2017 CLINICAL DATA:  Preoperative examination prior to right hip surgery. EXAM: CHEST 1 VIEW COMPARISON:  03/16/2017 FINDINGS: Cardiomegaly noted. There is no evidence of focal airspace disease, pulmonary edema, suspicious pulmonary nodule/mass, pleural effusion, or pneumothorax. No acute bony abnormalities are identified. Remote left rib fractures again noted. IMPRESSION: Cardiomegaly without evidence of acute cardiopulmonary disease. Electronically Signed   By: Margarette Canada M.D.   On: 04/08/2017 15:43   Dg Hip Unilat W Or Wo Pelvis 2-3 Views  Right  Result Date: 04/08/2017 CLINICAL DATA:  Acute right hip pain following fall. EXAM: DG HIP (WITH OR WITHOUT PELVIS) 2-3V RIGHT COMPARISON:  03/18/2017 FINDINGS: An acute comminuted intertrochanteric right femur fracture is noted with lesser trochanteric fragment displaced by 6 mm. There is no evidence of subluxation or dislocation. Internal fixation of a left intertrochanteric femur fracture is again noted. IMPRESSION: Acute comminuted intratrochanteric right femur fracture. Electronically Signed   By: Margarette Canada M.D.   On: 04/08/2017 15:34      Scheduled Meds: . buPROPion  300 mg Oral Daily  . DULoxetine  60 mg Oral Daily  . gabapentin  600 mg Oral TID  . insulin aspart  0-5 Units Subcutaneous QHS  . insulin aspart  0-9 Units Subcutaneous TID WC  . insulin aspart protamine- aspart  30 Units Subcutaneous Once  . insulin glargine  40 Units Subcutaneous QHS  . lactulose  75 g Oral TID  . magnesium oxide  400 mg Oral Daily  . omega-3 acid ethyl esters  1 g Oral Daily  . pantoprazole  40 mg Oral QHS  . povidone-iodine  2 application Topical Once  . pramipexole  2 mg Oral QHS  . rifaximin  550 mg Oral BID   Continuous Infusions: . sodium chloride 125 mL/hr at 04/09/17 0350  .  ceFAZolin (ANCEF) IV    . methocarbamol (ROBAXIN)  IV       LOS: 1 day    Time spent: 40 minutes   Dessa Phi, DO [facility] [email or web address] Password [facility] 04/09/2017, 11:25 AM

## 2017-04-09 NOTE — Progress Notes (Signed)
Call received from OR requesting report be called & t be ready for pick up.  Telephone report called to Horris Latino, RN (short stay unit).  All questions addressed & answered.  Of note, pt lethargic throughout this shift thus far; receiving RN notified of this.  Alert to self & birthday only.  Family present at bedside transported with pt to pre-op.  Pt left unit at approx 1350 to OR in stable condition.

## 2017-04-09 NOTE — Progress Notes (Signed)
Inpatient Diabetes Program Recommendations  AACE/ADA: New Consensus Statement on Inpatient Glycemic Control (2015)  Target Ranges:  Prepandial:   less than 140 mg/dL      Peak postprandial:   less than 180 mg/dL (1-2 hours)      Critically ill patients:  140 - 180 mg/dL   Lab Results  Component Value Date   GLUCAP 189 (H) 04/09/2017   HGBA1C 6.6 (H) 03/17/2017    Review of Glycemic Control  Diabetes history: DM2 Outpatient Diabetes medications: 70/30 100 units in am and 70 units QHS, 70 units at HS if CBG > 200 mg/dL Current orders for Inpatient glycemic control: Lantus 40 units QHS, Novolog 0-9 units tidwc and hs  Inpatient Diabetes Program Recommendations:    Lantus 100 units Q24H Novolog 15 units tidwc Novolog 0-20 units tidwc and hs  Will follow.  Thank you. Lorenda Peck, RD, LDN, CDE Inpatient Diabetes Coordinator [phone]

## 2017-04-09 NOTE — Consult Note (Signed)
ORTHOPAEDIC CONSULTATION  REQUESTING PHYSICIAN: Baird Lyons*  PCP:  [name], MD  Chief Complaint: Right hip fracture  HPI: Daisy Larson is a 66 y.o. female who complains of right hip pain after she fell out of bed yesterday. She is currently residing at collapse facility due to previous left hip fracture that underwent intramedullary fixation by myself on 03/18/2017. No other injuries. She was admitted by the hospitalist service yesterday for perioperative risk stratification and medical optimization. I was asked to assume care of the patient by my partner, Dr. Stann Mainland.  Past Medical History:  Diagnosis Date  . Allergy    tape  . Angina    COSTOCONDRITIS    . Anxiety   . Bell's palsy 2014   twice  . Breast cancer ([place]) 01/19/12    right breast lumpectomy/ER/PR positibe,her-2 neg.  . Breast cancer ([place]) 2011  . Bundle branch block   . Cirrhosis ([place])   . Colon polyps 12/02/2012   TUBULAR ADENOMAS (X2) and Hyperplastic (X1)  . Complication of anesthesia    WITH SECON HERNIA REPAIR 2012 [facility]  DIFFICULTY O2 SAT DROPPING ADMIT TO HOSPITAL   . Depression   . Diabetes mellitus   . Diverticulosis   . External hemorrhoids   . Gastritis   . GERD (gastroesophageal reflux disease)    ULCERS  . Goiter   . Headache(784.0)    MIGRAINES  . Heart murmur   . Hepatitis     Did Not have hepatitis but needed Hep A and Hep B injections2012 SPOTS ON LIVER  DR Alonza Bogus  [phone]  . Hernia   . History of stomach ulcers   . Hypertension   . Hyperthyroidism    NODULES ON THYROID  (DR BALEN 37  12-609)  . Iron deficiency anemia 10/16/2012  . Multiple thyroid nodules   . Neuromuscular disorder ([place])    PERIPH NEUROPATHY   . Neuropathy of foot   . Restless legs   . S/P radiation therapy 02/19/12 - 04/01/12   Right Breast: 5000 cgy/25 Fractions with Boost/ 1000 cGy/5 Fractions  . Short of breath on exertion   . Shortness of breath    WITH EXERTION   .  Sleep apnea    Past Surgical History:  Procedure Laterality Date  . ABDOMINAL HYSTERECTOMY    . ANAL FISSURE REPAIR    . APPENDECTOMY    . BACK SURGERY    . BREAST SURGERY     BIOPSY  Right Breast -  Sentinel Lymph Nodes  . CARPAL TUNNEL RELEASE     LEFT   . CESAREAN SECTION    . COLONOSCOPY W/ POLYPECTOMY  12/02/2012  . ESOPHAGOGASTRODUODENOSCOPY  12/02/2012   Gastritis  . HAND SURGERY Bilateral   . HERNIA REPAIR     X 2  . INTRAMEDULLARY (IM) NAIL INTERTROCHANTERIC Left 03/18/2017   Procedure: INTRAMEDULLARY (IM) NAIL INTERTROCHANTRIC;  Surgeon: Rod Can, MD;  Location: [facility];  Service: Orthopedics;  Laterality: Left;  . LIVER BIOPSY  03/28/11   [facility] - Cirrhosis  . SPINAL FUSION    . TONSILLECTOMY    . TUBAL LIGATION     Social History   Social History  . Marital status: Married    Spouse name: N/A  . Number of children: N/A  . Years of education: N/A   Occupational History  . Retired     Social History Main Topics  . Smoking status: Never Smoker  . Smokeless tobacco: Never Used  .  Alcohol use No  . Drug use: No  . Sexual activity: Not Asked   Other Topics Concern  . None   Social History Narrative   Former Chief Strategy Officer. Married to [place] who is a Theme park manager and who underwent a Heart Transplant at [facility] in May 2011.  2 children and 1 grandchild.      Daily caffeine    Family History  Problem Relation Age of Onset  . Diabetes Mother   . Hypertension Mother   . Heart disease Father   . Hypertension Father   . Hypertension Other   . Heart disease Other   . Lung cancer Maternal Grandfather   . Colon cancer Paternal Grandfather   . Heart disease Maternal Uncle   . Diabetes Sister   . Heart disease Sister   . Thyroid disease Sister   . Diabetes Daughter    Allergies  Allergen Reactions  . Other Rash    Blisters Per Patient - she has been told she can not have general anesthesia "unless life-threatening"  . Tape   .  Oxymorphone     lethargic  . Barbiturates Dermatitis and Other (See Comments)    Drowsiness, patient states that Barbiturates & Narcotics make her extremely sleepy and drowsy 24 Apr 2013 Drowsiness  "sleeps forever"   Prior to Admission medications   Medication Sig Start Date End Date Taking? Authorizing Provider  aspirin 81 MG chewable tablet Chew 81 mg by mouth daily.   Yes [provider]  b complex vitamins capsule Take 1 capsule by mouth daily.    Yes [provider]  buPROPion (WELLBUTRIN XL) 300 MG 24 hr tablet Take 300 mg by mouth Daily.  07/08/12  Yes [provider]  Calcium Carb-Cholecalciferol 600-400 MG-UNIT CAPS Take 2 tablets by mouth daily.   Yes [provider]  Cinnamon 500 MG capsule Take 500 mg by mouth daily.    Yes [provider]  citalopram (CELEXA) 20 MG tablet Take 20 mg by mouth daily.  03/13/15  Yes [provider]  DULoxetine (CYMBALTA) 60 MG capsule Take 60 mg by mouth daily.   Yes [provider]  enoxaparin (LOVENOX) 40 MG/0.4ML injection Inject 0.4 mLs (40 mg total) into the skin daily. Patient taking differently: Inject 40 mg into the skin See admin instructions. Inject 0.4 ml (40 mg) subcutaneously daily for 21 days - start date 03/23/17 - stop date 04/10/17 03/19/17  Yes Teckla Christiansen, Aaron Edelman, MD  fluticasone [facility]) 50 MCG/ACT nasal spray Place 1 spray into both nostrils daily as needed for allergies.  05/04/11  Yes [provider]  furosemide (LASIX) 40 MG tablet Take 60 mg by mouth daily.  02/21/13  Yes [provider]  gabapentin (NEURONTIN) 600 MG tablet Take 600 mg by mouth 3 (three) times daily.   Yes [provider]  glucagon (GLUCAGON EMERGENCY) 1 MG injection Inject 1 mg into the vein once as needed. Patient taking differently: Inject 1 mg into the vein once as needed (hypoglycemia).  05/04/15  Yes Elayne Snare, MD  HYDROcodone-acetaminophen (NORCO/VICODIN) 5-325 MG tablet  Take 1-2 tablets by mouth every 6 (six) hours as needed for moderate pain. Patient taking differently: Take 0.5 tablets by mouth every 6 (six) hours as needed for moderate pain or severe pain.  03/19/17  Yes Solash Tullo, Aaron Edelman, MD  insulin aspart protamine- aspart (NOVOLOG MIX 70/30) (70-30) 100 UNIT/ML injection Inject 70-100 Units into the skin See admin instructions. Inject 100 units subcutaneously every morning, 70 units  at dinner time (6 pm) and 70 units at bedtime only if CBG >200   Yes [provider]  lactulose (CHRONULAC) 10 GM/15ML solution Take 75 g by mouth 3 (three) times daily. 112 mls - 75 gm   Yes [provider]  lisinopril (PRINIVIL,ZESTRIL) 20 MG tablet Take 20 mg by mouth daily. Take in the morning 11/24/11  Yes [provider]  loratadine (CLARITIN) 10 MG tablet Take 10 mg by mouth daily.   Yes [provider]  Magnesium Oxide 400 MG CAPS Take 400 mg by mouth daily.    Yes [provider]  meclizine (ANTIVERT) 12.5 MG tablet Take 12.5 mg by mouth 3 (three) times daily as needed for dizziness.    Yes [provider]  Milk Thistle 1000 MG CAPS Take 1,000 mg by mouth daily.   Yes [provider]  Multiple Vitamin (MULTIVITAMIN WITH MINERALS) TABS tablet Take 1 tablet by mouth daily.   Yes [provider]  nitroGLYCERIN (NITROGLYN) 2 % ointment Apply topically See admin instructions. Apply a pea size amount to the left foot over post tibial artery four times a day behind ankle medial side for increased blood flow.   Yes [provider]  Omega-3 Fatty Acids (FISH OIL OMEGA-3 PO) Take 1 capsule by mouth daily.   Yes [provider]  pantoprazole (PROTONIX) 40 MG tablet Take 40 mg by mouth at bedtime.  05/05/14  Yes [provider]  polyethylene glycol (MIRALAX / GLYCOLAX) packet Take 17 g by mouth 3 (three) times daily as needed for mild constipation. Mix in 8 oz liquid and drink   Yes [provider]  pramipexole (MIRAPEX) 1 MG tablet Take 2 mg by mouth at bedtime.    Yes [provider]  propranolol (INDERAL LA) 60 MG 24 hr capsule Take 60 mg by mouth at bedtime.  11/23/11  Yes [provider]  rifaximin (XIFAXAN) 550 MG TABS tablet Take 550 mg by mouth See admin instructions. Take 1 tablet (550 mg) by mouth twice daily for 30 days - start date 03/21/17, stop date 04/19/17   Yes [provider]  spironolactone (ALDACTONE) 50 MG tablet Take 75 mg by mouth daily.  04/30/13  Yes [provider]  traMADol (ULTRAM) 50 MG tablet Take 1 tablet (50 mg total) by mouth every 6 (six) hours as needed for moderate pain or severe pain. Patient taking differently: Take 50 mg by mouth every 6 (six) hours as needed for moderate pain (pain scale 5-7).  03/21/17  Yes Sheikh, Lacey, DO  [place] Medication Name: Nitroglycerin ointment 0.125% Apply pea-size amount internally 4 times daily Patient not taking: Reported on 04/08/2017 03/04/15   Jerene Bears, MD  BD INSULIN SYRINGE ULTRAFINE 31G X 5/16" 0.3 ML MISC USE  TO INJECT  INSULIN THREE TIMES DAILY 01/10/17   Elayne Snare, MD  glucose blood test strip Use as instructed to check blood sugar 4 times per day Dx: E11.65 09/01/15   Elayne Snare, MD  [facility] DELICA LANCETS 23T MISC Use to test sugars 4 x daily. 02/19/15   Haydee Monica, MD   Dg Chest 1 View  Result Date: 04/08/2017 CLINICAL DATA:  Preoperative examination prior to right hip surgery. EXAM: CHEST 1 VIEW COMPARISON:  03/16/2017 FINDINGS: Cardiomegaly noted. There is no evidence of focal airspace disease, pulmonary edema, suspicious pulmonary nodule/mass, pleural effusion, or pneumothorax. No acute bony abnormalities are identified. Remote left rib fractures again  noted. IMPRESSION: Cardiomegaly without evidence of acute cardiopulmonary disease. Electronically Signed   By: Margarette Canada M.D.   On: 04/08/2017 15:43   Dg Hip Unilat  W Or Wo Pelvis 2-3 Views Right  Result Date: 04/08/2017 CLINICAL DATA:  Acute right hip pain following fall. EXAM: DG HIP (WITH OR WITHOUT PELVIS) 2-3V RIGHT COMPARISON:  03/18/2017 FINDINGS: An acute comminuted intertrochanteric right femur fracture is noted with lesser trochanteric fragment displaced by 6 mm. There is no evidence of subluxation or dislocation. Internal fixation of a left intertrochanteric femur fracture is again noted. IMPRESSION: Acute comminuted intratrochanteric right femur fracture. Electronically Signed   By: Margarette Canada M.D.   On: 04/08/2017 15:34    Positive ROS: All other systems have been reviewed and were otherwise negative with the exception of those mentioned in the HPI and as above.  Physical Exam: General: Alert, no acute distress Cardiovascular: No pedal edema Respiratory: No cyanosis, no use of accessory musculature GI: No organomegaly, abdomen is soft and non-tender Skin: No lesions in the area of chief complaint Neurologic: Sensation intact distally Psychiatric: Patient is competent for consent with normal mood and affect Lymphatic: No axillary or cervical lymphadenopathy  MUSCULOSKELETAL: Examination of the right lower shoulder reveals no skin wounds or lesions she does have tenderness palpation over the hip. She had shortening and external rotation. She has pain with attempted range of motion. She is neurovascularly intact.  Assessment: Right intertrochanteric femur fracture Status post IM fixation left intertrochanteric femur fracture on 03/18/2017  Plan: I discussed the findings with the patient and her family. I recommended intramedullary fixation of her right intertrochanteric femur fracture. We discussed the risks, benefits, and alternatives. Please see statement of risk. They wish to proceed.  The risks, benefits, and alternatives were discussed with the patient. There are risks associated with the surgery including, but not limited to, problems  with anesthesia (death), infection, differences in leg length/angulation/rotation, fracture of bones, loosening or failure of implants, malunion, nonunion, hematoma (blood accumulation) which may require surgical drainage, blood clots, pulmonary embolism, nerve injury (foot drop), and blood vessel injury. The patient understands these risks and elects to proceed.   Nadeen Shipman, Horald Pollen, MD Cell [phone]    04/09/2017 3:28 PM

## 2017-04-09 NOTE — Progress Notes (Signed)
Dr. Myna Hidalgo called regarding pt's am labs. New orders received and noted. RN will continue to monitor.

## 2017-04-09 NOTE — Progress Notes (Signed)
Notified pt will not come back to unit; transfer orders received/bed assigned ([id number]).  Nursing report called & given to Louie Casa, RN; all questions addressed and answered.

## 2017-04-09 NOTE — Clinical Social Work Note (Signed)
Clinical Social Work Assessment  Patient Details  Name: Daisy Larson MRN: [id number] Date of Birth: 06-Feb-1951  Date of referral:  04/09/17               Reason for consult:  Discharge Planning                Permission sought to share information with:  Family Supports Permission granted to share information::  Yes, Verbal Permission Granted  Name::     Hermina Barnard  Agency::  [facility]  Relationship::  Husband  Contact Information:  [phone]  Housing/Transportation Living arrangements for the past 2 months:  [place], [place] of Information:  Medical Team, Facility, Adult Children, Spouse Patient Interpreter Needed:  None Criminal Activity/Legal Involvement Pertinent to Current Situation/Hospitalization:  No - Comment as needed Significant Relationships:  Adult Children, Spouse Lives with:  Spouse, Facility Resident Do you feel safe going back to the place where you live?  Yes Need for family participation in patient care:  Yes (Comment)  Care giving concerns:  Patient is a short-term rehab resident at [facility].   Social Worker assessment / plan:  CSW met with patient. Husband and daughter at bedside. Patient oriented to self only. CSW introduced role and explained that discharge planning would be discussed. Patient's husband and daughter confirmed that patient was admitted from [facility] and the plan is for her to return once stable for discharge. As of 6/6, the patient had been there for two weeks. They are not paying for a bed hold. Patient will need a PT evaluation before she can return to SNF. No further concerns. CSW encouraged patient's family to contact CSW as needed. CSW will continue to follow patient and her family for support and facilitate discharge back to SNF once medically stable.  Employment status:  Retired Forensic scientist:  Medicare PT Recommendations:  Not assessed at this  time [place] / Referral to community resources:  [place]  Patient/Family's Response to care:  Patient oriented to self only. Patient's husband and daughter agreeable to return to [facility]. Patient's family supportive and involved in patient's care. Patient's family appreciated social work intervention.  Patient/Family's Understanding of and Emotional Response to Diagnosis, Current Treatment, and Prognosis:  Patient oriented to self only. Patient's husband and daughter have a good understanding of the reason for admission and her need to return to rehab. Per patient's husband, the patient wanted a Sudoku book in her room at [facility] and fell when she was trying to get it. Patient's family appear happy with hospital care.  Emotional Assessment Appearance:  Appears stated age Attitude/Demeanor/Rapport:  Unable to Assess Affect (typically observed):  Unable to Assess Orientation:  Oriented to Self Alcohol / Substance use:  Never Used Psych involvement (Current and /or in the community):  No (Comment)  Discharge Needs  Concerns to be addressed:  Care Coordination Readmission within the last 30 days:  Yes Current discharge risk:  Cognitively Impaired, Dependent with Mobility Barriers to Discharge:  Continued Medical Work up   Candie Chroman, LCSW 04/09/2017, 1:18 PM

## 2017-04-09 NOTE — Progress Notes (Signed)
RT set up CPAP and placed on patient. Patient tolerating well at this time. RN is aware. RT will monitor as needed. 

## 2017-04-09 NOTE — Progress Notes (Signed)
Set pt up on Cpap machine 8.0 CMH20 full face mask pt states this is what she wears at home.  4 lpm bled in oxygen.

## 2017-04-09 NOTE — Discharge Instructions (Signed)
Dr. Rod Can Adult Hip & Knee Specialist [facility] [address], [place], [place] [address] [phone]   POSTOPERATIVE DIRECTIONS    Hip Rehabilitation, Guidelines Following Surgery   WEIGHT BEARING Partial weight bearing with assist device as directed.  touch down weight bearing right leg   HOME CARE INSTRUCTIONS  Remove items at home which could result in a fall. This includes throw rugs or furniture in walking pathways.  Continue medications as instructed at time of discharge.  You may have some home medications which will be placed on hold until you complete the course of blood thinner medication.  4 days after discharge, you may start showering. No tub baths or soaking your incisions. Do not put on socks or shoes without following the instructions of your caregivers.   Sit on chairs with arms. Use the chair arms to help push yourself up when arising.  Arrange for the use of a toilet seat elevator so you are not sitting low.   Walk with walker as instructed.  You may resume a sexual relationship in one month or when given the OK by your caregiver.  Use walker as long as suggested by your caregivers.  Avoid periods of inactivity such as sitting longer than an hour when not asleep. This helps prevent blood clots.  You may return to work once you are cleared by Engineer, production.  Do not drive a car for 6 weeks or until released by your surgeon.  Do not drive while taking narcotics.  Wear elastic stockings for two weeks following surgery during the day but you may remove then at night.  Make sure you keep all of your appointments after your operation with all of your doctors and caregivers. You should call the office at the above phone number and make an appointment for approximately two weeks after the date of your surgery. Please pick up a stool softener and laxative for home use as long as you are requiring pain medications.  ICE to the  affected hip every three hours for 30 minutes at a time and then as needed for pain and swelling. Continue to use ice on the hip for pain and swelling from surgery. You may notice swelling that will progress down to the foot and ankle.  This is normal after surgery.  Elevate the leg when you are not up walking on it.   It is important for you to complete the blood thinner medication as prescribed by your doctor.  Continue to use the breathing machine which will help keep your temperature down.  It is common for your temperature to cycle up and down following surgery, especially at night when you are not up moving around and exerting yourself.  The breathing machine keeps your lungs expanded and your temperature down.  RANGE OF MOTION AND STRENGTHENING EXERCISES  These exercises are designed to help you keep full movement of your hip joint. Follow your caregiver's or physical therapist's instructions. Perform all exercises about fifteen times, three times per day or as directed. Exercise both hips, even if you have had only one joint replacement. These exercises can be done on a training (exercise) mat, on the floor, on a table or on a bed. Use whatever works the best and is most comfortable for you. Use music or television while you are exercising so that the exercises are a pleasant break in your day. This will make your life better with the exercises acting as a break in routine  you can look forward to.  °Lying on your back, slowly slide your foot toward your buttocks, raising your knee up off the floor. Then slowly slide your foot back down until your leg is straight again.  °Lying on your back spread your legs as far apart as you can without causing discomfort.  °Lying on your side, raise your upper leg and foot straight up from the floor as far as is comfortable. Slowly lower the leg and repeat.  °Lying on your back, tighten up the muscle in the front of your thigh (quadriceps muscles). You can do this by  keeping your leg straight and trying to raise your heel off the floor. This helps strengthen the largest muscle supporting your knee.  °Lying on your back, tighten up the muscles of your buttocks both with the legs straight and with the knee bent at a comfortable angle while keeping your heel on the floor.  ° °SKILLED REHAB INSTRUCTIONS: °If the patient is transferred to a skilled rehab facility following release from the hospital, a list of the current medications will be sent to the facility for the patient to continue.  When discharged from the skilled rehab facility, please have the facility set up the patient's Home Health Physical Therapy prior to being released. Also, the skilled facility will be responsible for providing the patient with their medications at time of release from the facility to include their pain medication and their blood thinner medication. If the patient is still at the rehab facility at time of the two week follow up appointment, the skilled rehab facility will also need to assist the patient in arranging follow up appointment in our office and any transportation needs. ° °MAKE SURE YOU:  °Understand these instructions.  °Will watch your condition.  °Will get help right away if you are not doing well or get worse. ° °Pick up stool softner and laxative for home use following surgery while on pain medications. °Daily dry dressing changes as needed. °In 4 days, you may remove your dressings and begin taking showers - no tub baths or soaking the incisions. °Continue to use ice for pain and swelling after surgery. °Do not use any lotions or creams on the incision until instructed by your surgeon. ° ° °

## 2017-04-09 NOTE — Anesthesia Preprocedure Evaluation (Addendum)
Anesthesia Evaluation  Patient identified by MRN, date of birth, ID band Patient awake    Reviewed: Allergy & Precautions, NPO status , Patient's Chart, lab work & pertinent test results, reviewed documented beta blocker date and time   History of Anesthesia Complications (+) history of anesthetic complications ("s[facility] dropped [facility]")  Airway Mallampati: III  TM Distance: <3 FB Neck ROM: Full    Dental  (+) Dental Advisory Given, Poor Dentition, Chipped   Pulmonary shortness of breath, sleep apnea and Continuous Positive Airway Pressure Ventilation ,    Pulmonary exam normal breath sounds clear to auscultation       Cardiovascular hypertension, Pt. on medications and Pt. on home beta blockers + angina Normal cardiovascular exam+ dysrhythmias + Valvular Problems/Murmurs  Rhythm:Regular Rate:Normal     Neuro/Psych  Headaches, PSYCHIATRIC DISORDERS Anxiety Depression  Neuromuscular disease    GI/Hepatic PUD, GERD  Medicated,(+) Cirrhosis       , Hepatitis -  Endo/Other  diabetes, Insulin DependentHyperthyroidism Morbid obesity  Renal/GU Renal InsufficiencyRenal diseaseK+ 5.7     Musculoskeletal  (+) Arthritis ,   Abdominal   Peds  Hematology  (+) Blood dyscrasia, anemia , Plt 125k   Anesthesia Other Findings   Reproductive/Obstetrics                             Anesthesia Physical  Anesthesia Plan  ASA: IV  Anesthesia Plan: General   Post-op Pain Management:    Induction: Intravenous  PONV Risk Score and Plan: 3 and Ondansetron, Dexamethasone, Propofol and Treatment may vary due to age or medical condition  Airway Management Planned: Oral ETT  Additional Equipment:   Intra-op Plan:   Post-operative Plan: Possible Post-op intubation/ventilation  Informed Consent: I have reviewed the patients History and Physical, chart, labs and discussed the procedure including the  risks, benefits and alternatives for the proposed anesthesia with the patient or authorized representative who has indicated his/her understanding and acceptance.   Dental advisory given  Plan Discussed with: CRNA  Anesthesia Plan Comments:       Anesthesia Quick Evaluation

## 2017-04-09 NOTE — Progress Notes (Addendum)
Pt arrived to Pacu report taken.  Pts BP systolic in 34'K (see flowsheet).  Left arm IV sites x2 swollen and left thumb IV 24g NSL.  Anesthesia attending at bedside orders given for Albumin, Neo-synepherine, and PRBCs 2 units.  Anesthesia attending Joselin and CRNA at bedside decision made to place central line.  Central line placed pt tolerated well.  Albumin given, Neo drip started at 40 mcs per orders, and PRBcs obtained from blood bank.  Pt in and out of sleep throughout event able to answer questions appropriately.  VSS stable with interventions (see flowsheet).  Anesthesia attending at bedside until VSS.   Will cont to monitor closely.

## 2017-04-09 NOTE — Op Note (Signed)
OPERATIVE REPORT  SURGEON: Rod Can, MD   ASSISTANT: Ky Barban, RNFA.  PREOPERATIVE DIAGNOSIS: Right intertrochanteric femur fracture.   POSTOPERATIVE DIAGNOSIS: Right intertrochanteric femur fracture.   PROCEDURE: Intramedullary fixation, Right femur.   IMPLANTS: Biomet Affixus Hip Fracture Nail, 11 by 380 mm, 130 degrees. 10.5 x 115 mm Hip Fracture Nail Lag Screw. 5 x 44 mm distal interlocking screw 1.  ANESTHESIA:  General  ESTIMATED BLOOD LOSS: 150 mL.  ANTIBIOTICS: 2 g Ancef.  DRAINS: None.  COMPLICATIONS: None.   CONDITION: PACU - hemodynamically stable.  BRIEF CLINICAL NOTE: Daisy Larson is a 66 y.o. female who presented with an intertrochanteric femur fracture. The patient was admitted to the hospitalist service and underwent perioperative risk stratification and medical optimization. The risks, benefits, and alternatives to the procedure were explained, and the patient elected to proceed.  PROCEDURE IN DETAIL: Surgical site was marked by myself. The patient was taken to the operating room and anesthesia was induced on the bed. The patient was then transferred to the [facility] table and the nonoperative lower extremity was scissored underneath the operative side. The fracture was reduced with traction, internal rotation, and adduction. The hip was prepped and draped in the normal sterile surgical fashion. Timeout was called verifying side and site of surgery. Preop antibiotics were given with 60 minutes of beginning the procedure.  Fluoroscopy was used to define the patient's anatomy. A 4 cm incision was made just proximal to the tip of the greater trochanter. The awl was used to obtain the standard starting point for a trochanteric entry nail under fluoroscopic control. The guidepin was placed. The entry reamer was used to open the proximal femur.  I placed the guidewire to the level of the physeal scar of the knee. I measured the length of the guidewire.  Sequential reaming was performed up to a size 12.5 mm with excellent chatter. Therefore, a size 11 by 380 mm nail was selected and assembled to the jig on the back table. The nail was placed without any difficulty. Through a separate stab incision, the cannula was placed down to the bone in preparation for the cephalomedullary device. A guidepin was placed into the femoral head using AP and lateral fluoroscopy views. The pin was measured, and then reaming was performed to the appropriate depth. The lag screw was inserted to the appropriate depth. The fracture was compressed through the jig. The setscrew was tightened and then loosened one quarter turn. Using perfect circle technique, a distal interlocking screw was placed. The jig was removed. Final AP and lateral fluoroscopy views were obtained to confirm fracture reduction and hardware placement. Tip apex distance was appropriate. There was no chondral penetration.  The wounds were copiously irrigated with saline. The wound was closed in layers with #1 Vicryl for the fascia, 2-0 Monocryl for the deep dermal layer, and 3-0 Monocryl subcuticular stitch. Glue was applied to the skin. Once the glue was fully hardened, sterile dressing was applied. The patient was then awakened from anesthesia and taken to the PACU in stable condition. Sponge needle and instrument counts were correct at the end of the case 2. There were no known complications.  We will readmit the patient to the hospitalist. Weightbearing status will be touchdown weightbearing with a walker. We will begin Lovenox for DVT prophylaxis. The patient will work with physical therapy and undergo disposition planning.

## 2017-04-09 NOTE — Anesthesia Procedure Notes (Signed)
Procedure Name: Intubation Date/Time: 04/09/2017 4:33 PM Performed by: Teressa Lower Pre-anesthesia Checklist: Patient identified, Emergency Drugs available, Suction available and Patient being monitored Patient Re-evaluated:Patient Re-evaluated prior to inductionOxygen Delivery Method: Circle system utilized Preoxygenation: Pre-oxygenation with 100% oxygen Intubation Type: IV induction Ventilation: Mask ventilation without difficulty Laryngoscope Size: Glidescope and 3 Grade View: Grade II Tube type: Oral Tube size: 7.5 mm Number of attempts: 1 Airway Equipment and Method: Stylet and Oral airway Placement Confirmation: ETT inserted through vocal cords under direct vision,  positive ETCO2 and breath sounds checked- equal and bilateral Secured at: 22 cm Tube secured with: Tape Dental Injury: Teeth and Oropharynx as per pre-operative assessment

## 2017-04-09 NOTE — Transfer of Care (Signed)
Immediate Anesthesia Transfer of Care Note  Patient: Daisy Larson  Procedure(s) Performed: Procedure(s): INTRAMEDULLARY (IM) NAIL INTERTROCHANTRIC (Right)  Patient Location: PACU  Anesthesia Type:General  Level of Consciousness: awake, alert  and responds to stimulation  Airway & Oxygen Therapy: Patient Spontanous Breathing and Patient connected to face mask oxygen  Post-op Assessment: Report given to RN and Post -op Vital signs reviewed and stable  Post vital signs: Reviewed and stable  Last Vitals:  Vitals:   04/09/17 1231 04/09/17 1830  BP: (!) 99/58   Pulse: 70   Resp: 14   Temp: 36.9 C 36.4 C    Last Pain:  Vitals:   04/09/17 1231  TempSrc: Oral  PainSc:          Complications: No apparent anesthesia complications

## 2017-04-09 NOTE — Anesthesia Procedure Notes (Addendum)
Central Venous Catheter Insertion Performed by: Roberts Gaudy, anesthesiologist Start/End6/08/2017 6:50 PM, 04/09/2017 6:55 AM Preanesthetic checklist: patient identified, IV checked, site marked, risks and benefits discussed, surgical consent, monitors and equipment checked, pre-op evaluation and timeout performed Position: supine Lidocaine 1% used for infiltration Catheter size: 8 Fr Central line was placed.Double lumen Procedure performed using ultrasound guided technique. Ultrasound Notes:anatomy identified, needle tip was noted to be adjacent to the nerve/plexus identified and no ultrasound evidence of intravascular and/or intraneural injection Attempts: 1 Following insertion, line sutured, dressing applied and Biopatch. Post procedure assessment: blood return through all ports, free fluid flow and no air  Patient tolerated the procedure well with no immediate complications.

## 2017-04-09 NOTE — Care Management Note (Signed)
Case Management Note  Patient Details  Name: Daisy Larson MRN: [id number] Date of Birth: November 09, 1950  Subjective/Objective:   From [facility], presents with R Femur Fx from fall, plan for surgery 6/11. Plan will be to return to SNF when medically ready. CSW referral.  PCP   [name]              Action/Plan: NCM will follow along with CSW  For dc needs.  Expected Discharge Date:                  Expected Discharge Plan:  Skilled Nursing Facility  In-House Referral:  Clinical Social Work  Discharge planning Services  CM Consult  Post Acute Care Choice:    Choice offered to:     DME Arranged:    DME Agency:     HH Arranged:    [place] Agency:     Status of Service:  Completed, signed off  If discussed at [employer], dates discussed:    Additional Comments:  Altha, [place], RN 04/09/2017, 8:52 AM

## 2017-04-10 ENCOUNTER — Encounter (HOSPITAL_COMMUNITY): Payer: Self-pay | Admitting: Orthopedic Surgery

## 2017-04-10 LAB — BASIC METABOLIC PANEL
ANION GAP: 3 — AB (ref 5–15)
Anion gap: 3 — ABNORMAL LOW (ref 5–15)
BUN: 24 mg/dL — AB (ref 6–20)
BUN: 23 mg/dL — ABNORMAL HIGH (ref 6–20)
CALCIUM: 7.4 mg/dL — AB (ref 8.9–10.3)
CHLORIDE: 107 mmol/L (ref 101–111)
CO2: 26 mmol/L (ref 22–32)
CO2: 26 mmol/L (ref 22–32)
Calcium: 7.4 mg/dL — ABNORMAL LOW (ref 8.9–10.3)
Chloride: 108 mmol/L (ref 101–111)
Creatinine, Ser: 1.34 mg/dL — ABNORMAL HIGH (ref 0.44–1.00)
Creatinine, Ser: 1.28 mg/dL — ABNORMAL HIGH (ref 0.44–1.00)
GFR calc Af Amer: 47 mL/min — ABNORMAL LOW (ref >60)
GFR calc Af Amer: 49 mL/min — ABNORMAL LOW (ref >60)
GFR calc non Af Amer: 40 mL/min — ABNORMAL LOW (ref >60)
GFR calc non Af Amer: 43 mL/min — ABNORMAL LOW (ref >60)
GLUCOSE: 240 mg/dL — AB (ref 65–99)
GLUCOSE: 252 mg/dL — AB (ref 65–99)
POTASSIUM: 5.7 mmol/L — AB (ref 3.5–5.1)
Potassium: 5.3 mmol/L — ABNORMAL HIGH (ref 3.5–5.1)
SODIUM: 136 mmol/L (ref 135–145)
Sodium: 137 mmol/L (ref 135–145)

## 2017-04-10 LAB — GLUCOSE, CAPILLARY
GLUCOSE-CAPILLARY: 200 mg/dL — AB (ref 65–99)
Glucose-Capillary: 220 mg/dL — ABNORMAL HIGH (ref 65–99)
Glucose-Capillary: 248 mg/dL — ABNORMAL HIGH (ref 65–99)
Glucose-Capillary: 185 mg/dL — ABNORMAL HIGH (ref 65–99)

## 2017-04-10 LAB — CBC
HEMATOCRIT: 28.2 % — AB (ref 36.0–46.0)
HEMOGLOBIN: 8.5 g/dL — AB (ref 12.0–15.0)
MCH: 29.9 pg (ref 26.0–34.0)
MCHC: 30.1 g/dL (ref 30.0–36.0)
MCV: 99.3 fL (ref 78.0–100.0)
Platelets: 98 10*3/uL — ABNORMAL LOW (ref 150–400)
RBC: 2.84 MIL/uL — AB (ref 3.87–5.11)
RDW: 17.8 % — ABNORMAL HIGH (ref 11.5–15.5)
WBC: 7.1 10*3/uL (ref 4.0–10.5)

## 2017-04-10 MED ORDER — LACTULOSE ENEMA
300.0000 mL | Freq: Once | ORAL | Status: AC
  Administered 2017-04-10: 300 mL via RECTAL
  Filled 2017-04-10: qty 300

## 2017-04-10 MED ORDER — INSULIN GLARGINE 100 UNIT/ML ~~LOC~~ SOLN
48.0000 [IU] | Freq: Every day | SUBCUTANEOUS | Status: DC
Start: 2017-04-10 — End: 2017-04-13
  Administered 2017-04-10 – 2017-04-12 (×3): 48 [IU] via SUBCUTANEOUS
  Filled 2017-04-10 (×3): qty 0.48

## 2017-04-10 MED ORDER — INSULIN ASPART 100 UNIT/ML ~~LOC~~ SOLN
12.0000 [IU] | Freq: Three times a day (TID) | SUBCUTANEOUS | Status: DC
  Administered 2017-04-10 – 2017-04-14 (×10): 12 [IU] via SUBCUTANEOUS

## 2017-04-10 MED ORDER — ACETAMINOPHEN 325 MG PO TABS
650.0000 mg | ORAL_TABLET | Freq: Four times a day (QID) | ORAL | Status: DC | PRN
  Administered 2017-04-10: 650 mg via ORAL
  Filled 2017-04-10: qty 2

## 2017-04-10 MED ORDER — SODIUM POLYSTYRENE SULFONATE 15 GM/60ML PO SUSP
30.0000 g | Freq: Once | ORAL | Status: AC
  Administered 2017-04-10: 30 g via ORAL
  Filled 2017-04-10: qty 120

## 2017-04-10 MED ORDER — SODIUM BICARBONATE 8.4 % IV SOLN
50.0000 meq | Freq: Once | INTRAVENOUS | Status: AC
  Administered 2017-04-10: 50 meq via INTRAVENOUS
  Filled 2017-04-10: qty 50

## 2017-04-10 NOTE — Plan of Care (Signed)
Problem: Education: Goal: Knowledge of [facility] Education information/materials will improve Outcome: Progressing Discussed plan of care with patient and her family. Oriented to unit and discussed how we will care for her. Patient agreeable. Questions answered.   Problem: Pain Managment: Goal: General experience of comfort will improve Outcome: Progressing Patient able to get sleep with use of morphine overnight. With movement patient yells out in pain.   Problem: Physical Regulation: Goal: Ability to maintain clinical measurements within normal limits will improve Outcome: Progressing Patient blood pressures remained soft overnight. Continuing to monitor closely.   Problem: Skin Integrity: Goal: Risk for impaired skin integrity will decrease Outcome: Progressing Patient turned every two hours.   Problem: Activity: Goal: Risk for activity intolerance will decrease Outcome: Progressing Discussed with patient and family that she will be working with PT again.   Problem: Fluid Volume: Goal: Ability to maintain a balanced intake and output will improve Outcome: Progressing  Urinary output sufficient- foley in place with orders to remove POD 1  Problem: Nutrition: Goal: Adequate nutrition will be maintained Outcome: Progressing Patient tolerated clear liquids well - diet advanced to carb modified for breakfast.  Problem: Bowel/Gastric: Goal: Will not experience complications related to bowel motility Outcome: Progressing Patient has not had bowel movement despite being given Lactulose and Kaexalate.

## 2017-04-10 NOTE — Evaluation (Signed)
Physical Therapy Evaluation Patient Details Name: Daisy Larson MRN: [id number] DOB: December 05, 1950 Today's Date: 04/10/2017   History of Present Illness  Pt adm with rt femur fx after fall at SNF. Underwent ORIF on 04/09/17. Pt with recent (3wks ago) rt femur fx with ORIF. PMH - malignant neoplasm of upper-outer quadrant of right breast in female, Charcot foot and diabetic arthropathy status post several surgical interventions, complicated by intra-articular infection status post intra-articular bead placement about 3 weeks ago, nonalcoholic cirrhosis, lt femur fx 03/18/17  Clinical Impression  Pt admitted with above diagnosis and presents to PT with functional limitations due to deficits listed below (See PT problem list). Pt needs skilled PT to maximize independence and safety to allow discharge to back to SNF. Pt very lethargic and very little participation due to this. Expect her progress to be very slow.     Follow Up Recommendations SNF    Equipment Recommendations  Other (comment) (TBA)    Recommendations for Other Services       Precautions / Restrictions Precautions Precautions: Fall Required Braces or Orthoses: Other Brace/Splint Other Brace/Splint: pt requires diabetic shoe and "special" shoe for L foot s/p antibiotic beads  (per recent chart) Restrictions Weight Bearing Restrictions: Yes RLE Weight Bearing: Non weight bearing LLE Weight Bearing: Weight bearing as tolerated (for transfers) Other Position/Activity Restrictions: Per ortho progress note      Mobility  Bed Mobility Overal bed mobility: Needs Assistance Bed Mobility: Supine to Sit;Sit to Supine     Supine to sit: +2 for physical assistance;Total assist;HOB elevated Sit to supine: +2 for physical assistance;Total assist   General bed mobility comments: Assist for all aspects. Pt not assisting at all due to lethargy  Transfers                 General transfer comment: Unable to  attempt  Ambulation/Gait                Stairs            Wheelchair Mobility    Modified Rankin (Stroke Patients Only)       Balance Overall balance assessment: Needs assistance Sitting-balance support: Bilateral upper extremity supported;Feet supported Sitting balance-Leahy Scale: Zero Sitting balance - Comments: Sat EOB x 5-6 minutes with total assist of 2. No righting reactions                                      Pertinent Vitals/Pain Pain Assessment: Faces Faces Pain Scale: Hurts whole lot Pain Location: rt hip with any movement Pain Descriptors / Indicators: Grimacing (crying out but then immediately back to sleep) Pain Intervention(s): Limited activity within patient's tolerance;Monitored during session    Home Living Family/patient expects to be discharged to:: Skilled nursing facility                      Prior Function Level of Independence: Needs assistance   Gait / Transfers Assistance Needed: Assist for transfers at SNF?     Comments: pt too lethargic to accurately state     Hand Dominance   Dominant Hand: Left    Extremity/Trunk Assessment   Upper Extremity Assessment Upper Extremity Assessment: Defer to OT evaluation    Lower Extremity Assessment Lower Extremity Assessment: RLE deficits/detail;LLE deficits/detail RLE Deficits / Details: Limited PROM due to pain. Pt not actively assisting due to lethargy LLE Deficits / Details:  Limited PROM due to pain. Pt not actively assisting due to lethargy       Communication      Cognition Arousal/Alertness: Lethargic Behavior During Therapy: Flat affect Overall Cognitive Status: Difficult to assess                         Following Commands:  (didn't follow commands)       General Comments: Pt only arousable for very brief periods and unable to participate or follow commands      General Comments      Exercises     Assessment/Plan    PT  Assessment Patient needs continued PT services  PT Problem List Decreased strength;Decreased activity tolerance;Decreased balance;Decreased mobility;Decreased cognition;Decreased knowledge of use of DME;Pain       PT Treatment Interventions DME instruction;Functional mobility training;Therapeutic activities;Therapeutic exercise;Balance training;Cognitive remediation;Patient/family education    PT Goals (Current goals can be found in the Care Plan section)  Acute Rehab PT Goals Patient Stated Goal: Pt unable PT Goal Formulation: Patient unable to participate in goal setting Time For Goal Achievement: 04/24/17 Potential to Achieve Goals: Fair    Frequency Min 3X/week   Barriers to discharge        Co-evaluation PT/OT/SLP Co-Evaluation/Treatment: Yes Reason for Co-Treatment: For patient/therapist safety PT goals addressed during session: Mobility/safety with mobility;Balance         AM-PAC PT "6 Clicks" Daily Activity  Outcome Measure Difficulty turning over in bed (including adjusting bedclothes, sheets and blankets)?: Total Difficulty moving from lying on back to sitting on the side of the bed? : Total Difficulty sitting down on and standing up from a chair with arms (e.g., wheelchair, bedside commode, etc,.)?: Total Help needed moving to and from a bed to chair (including a wheelchair)?: Total Help needed walking in hospital room?: Total Help needed climbing 3-5 steps with a railing? : Total 6 Click Score: 6    End of Session Equipment Utilized During Treatment: Oxygen Activity Tolerance: Patient limited by lethargy Patient left: in bed;with call bell/phone within reach;with bed alarm set Nurse Communication: Mobility status (lethargy) PT Visit Diagnosis: Other abnormalities of gait and mobility (R26.89);Repeated falls (R29.6);Muscle weakness (generalized) (M62.81);History of falling (Z91.81);Difficulty in walking, not elsewhere classified (R26.2);Pain Pain - Right/Left:  Right Pain - part of body: Hip    Time: 1348-1410 PT Time Calculation (min) (ACUTE ONLY): 22 min   Charges:   PT Evaluation $PT Eval Moderate Complexity: 1 Procedure     PT G CodesMarland Kitchen        [facility] [place] 04/10/2017, 4:04 PM

## 2017-04-10 NOTE — Progress Notes (Signed)
Inpatient Diabetes Program Recommendations  AACE/ADA: New Consensus Statement on Inpatient Glycemic Control (2015)  Target Ranges:  Prepandial:   less than 140 mg/dL      Peak postprandial:   less than 180 mg/dL (1-2 hours)      Critically ill patients:  140 - 180 mg/dL   Lab Results  Component Value Date   GLUCAP 220 (H) 04/10/2017   HGBA1C 6.6 (H) 03/17/2017    Review of Glycemic Control:  Results for MINETTE, MANDERS (MRN [id number]) as of 04/10/2017 10:07  Ref. Range 04/09/2017 22:36 04/10/2017 08:59  Glucose-Capillary Latest Ref Range: 65 - 99 mg/dL 219 (H) 220 (H)   Diabetes history: Type 2 diabetes Outpatient Diabetes medications: Novolog 70/30 mix 100 units q AM, 70 units with dinner and 70 units at HS if CBG>200 Current orders for Inpatient glycemic control:  Lantus 40 units q HS, Novolog sensitive tid with meals and HS  Inpatient Diabetes Program Recommendations:   Note that per medication reconciliation, patient was on a total of 170 units of 70/30 mix (there is also a prn dose of 70/30 in the PM). May consider increasing Lantus to 48 units q HS and add Novolog meal coverage 12 units tid with meals.    Thanks, Adah Perl, RN, BC-ADM Inpatient Diabetes Coordinator Pager [phone] (8a-5p)

## 2017-04-10 NOTE — Progress Notes (Signed)
PROGRESS NOTE    Daisy Larson  NFA:[id number] DOB: 1951/08/16 DOA: 04/08/2017 PCP: [name], MD     Brief Narrative:  Daisy Larson is a 66 y.o. 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 She was at the nursing facility when she got out of bed unassisted to obtain a [place] book and fell. She is experiencing muscle spasms and right upper leg pain with external rotation and shortening of the right lower extremity. She was given 150 g of fentanyl by EMS prior to arrival. Imaging obtained in the ER reveals comminuted fracture of the right femur. Patient underwent right intramedullary fixation 6/11.   Assessment & Plan:   Principal Problem:   Closed displaced comminuted fracture of shaft of right femur ([place]) Active Problems:   Liver cirrhosis secondary to NASH (HCC)   GERD (gastroesophageal reflux disease)   Sleep apnea   Closed left comminuted femur fracture  s/p intramedullary nailing (HCC)   Charcot's joint of foot, left w/ chronic wounds   Diabetes mellitus   Hypertension   Thrombocytopenia (HCC)   Anemia, macrocytic   Dehydration with hyponatremia   Closed comminuted intertrochanteric fracture of proximal end of right femur (HCC)  Closed displaced comminuted fracture of shaft of right femur  -Presents with mechanical fall after unassisted ambulation resulting in femur fracture -Orthopedic team consulted. Patient underwent right intramedullary fixation on 6/11 with Dr. [name]. Had hypotension in PACU post operatively, received 2u pRBC  -PT OT   Hyperkalemia -Improving with bicarb amp today. Continue to monitor   CKD stage  3 -Baseline Cr 1.1-1.3 -Stable   Liver cirrhosis secondary to NASH, with hx hepatic encephalopathy  -Ammonia level 62  -Continue Rifaximin, lactulose  -Hold preadmission lasix, spironolactone due to low BP   OSA -Continue nocturnal CPAP   Diabetes mellitus -Hemoglobin A1c was 6.6 on 5/19 -Lantus + Novolog SSI - dose adjusted today  -DM coordinator consult   Hypertension -Hold preadmission lisinopril, lasix, spironolactone due to low BP   GERD  -Continue Protonix  Charcot's joint of foot, left w/ chronic wounds -Chronic issue followed at [facility] -Presumed hardware infection and has had intra-articular antibody beads placed -Prior to last admission family was using Betadine for wound care at home -She is to follow-up with her primary surgeon Dr. [name] at [facility] in [place]  Thrombocytopenia  -Was on Lovenox for DVT prophylaxis at nursing facility -Stable, monitor   Anemia, macrocytic -Hemoglobin stable and at baseline around 8 -Had hypotension in PACU post operatively, received 2u pRBC   Restless leg syndrome -Continue Mirapex  Depression -Continue wellbutrin, cymbalta     DVT prophylaxis: SCDs Code Status: Full Family Communication: No family at bedside Disposition Plan: SNF    Consultants:   Orthopedic surgery  Procedures:   None  Antimicrobials:  Anti-infectives    Start     Dose/Rate Route Frequency Ordered Stop   04/09/17 2330  ceFAZolin (ANCEF) IVPB 2g/100 mL premix     2 g 200 mL/hr over 30 Minutes Intravenous Every 6 hours 04/09/17 2224 04/10/17 0633   04/09/17 0600  ceFAZolin (ANCEF) IVPB 2g/100 mL premix     2 g 200 mL/hr over 30 Minutes Intravenous On call to  O.R. 04/08/17 1733 04/09/17 1633   04/08/17 2200  rifaximin (XIFAXAN) tablet 550 mg     550 mg Oral 2 times daily 04/08/17 1737         Subjective: Patient is quite sleepy and sleeping comfortably in bed on CPAP. She had hypotension  in PACU post operatively and received 2u pRBC. No new complaints today.   Objective: Vitals:   04/10/17 0600 04/10/17 0733 04/10/17 0900 04/10/17 1200  BP: 108/68 (!) 98/48 (!) 116/54 (!) 105/51  Pulse: 67 66 69 71  Resp: 18 14 17 18   Temp:  99.7 F (37.6 C)  100 F (37.8 C)  TempSrc:  Axillary  Axillary  SpO2: 100% 93% 97% 95%  Weight:      Height:        Intake/Output Summary (Last 24 hours) at 04/10/17 1446 Last data filed at 04/10/17 1209  Gross per 24 hour  Intake          3555.75 ml  Output              850 ml  Net          2705.75 ml   Filed Weights   04/08/17 1733 04/09/17 2220  Weight: 122.3 kg (269 lb 10 oz) 126.3 kg (278 lb 7.1 oz)    Examination: General exam: Appears calm and comfortable  Respiratory system: Clear to auscultation. Respiratory effort normal. On cpap  Cardiovascular system: S1 & S2 heard, RRR. No JVD, murmurs, rubs, gallops or clicks. No pedal edema. Gastrointestinal system: Abdomen is nondistended, soft and nontender. No organomegaly or masses felt. Normal bowel sounds heard. Central nervous system: Patient alert to voice but falls asleep. Denies any complaints, although does not stay awake long enough to follow commands appropriately. Is oriented to place and time. Extremities: No edema or cyanosis  Skin: No rashes, lesions or ulcers on visible skin   Data Reviewed: I have personally reviewed following labs and imaging studies  CBC:  Recent Labs Lab 04/08/17 1535 04/09/17 0332 04/10/17 0558  WBC 4.1 5.2 7.1  NEUTROABS 3.0  --   --   HGB 8.5* 8.0* 8.5*  HCT 27.9* 27.3* 28.2*  MCV 100.7* 101.9* 99.3  PLT 91* 97* 98*   Basic Metabolic Panel:  Recent Labs Lab 04/08/17 1535 04/09/17 0332 04/09/17 0814 04/09/17 2230 04/10/17 0558 04/10/17 1300  NA 134* 135  --   --  136 137  K 5.2* 6.0* 5.8* 5.8* 5.7* 5.3*  CL 104 103  --   --  107 108  CO2 25 28  --   --  26 26  GLUCOSE 151* 169*  --   --  240* 252*  BUN 13 17  --   --  24*  23*  CREATININE 1.18* 1.25*  --   --  1.34* 1.28*  CALCIUM 8.8* 8.0*  --   --  7.4* 7.4*   GFR: Estimated Creatinine Clearance: 60.7 mL/min (A) (by C-G formula based on SCr of 1.28 mg/dL (H)). Liver Function Tests: No results for input(s): AST, ALT, ALKPHOS, BILITOT, PROT, ALBUMIN in the last 168 hours. No results for input(s): LIPASE, AMYLASE in the last 168 hours.  Recent Labs Lab 04/08/17 1800  AMMONIA 62*   Coagulation Profile: No results for input(s): INR, PROTIME in the last 168 hours. Cardiac Enzymes: No results for input(s): CKTOTAL, CKMB, CKMBINDEX, TROPONINI in the last 168 hours. BNP (last 3 results) No results for input(s): PROBNP in the last 8760 hours. HbA1C: No  results for input(s): HGBA1C in the last 72 hours. CBG:  Recent Labs Lab 04/09/17 1239 04/09/17 1834 04/09/17 2236 04/10/17 0859 04/10/17 1206  GLUCAP 192* 144* 219* 220* 248*   Lipid Profile: No results for input(s): CHOL, HDL, LDLCALC, TRIG, CHOLHDL, LDLDIRECT in the last 72 hours. Thyroid Function Tests: No results for input(s): TSH, T4TOTAL, FREET4, T3FREE, THYROIDAB in the last 72 hours. Anemia Panel: No results for input(s): VITAMINB12, FOLATE, FERRITIN, TIBC, IRON, RETICCTPCT in the last 72 hours. Sepsis Labs: No results for input(s): PROCALCITON, LATICACIDVEN in the last 168 hours.  Recent Results (from the past 240 hour(s))  MRSA PCR Screening     Status: None   Collection Time: 04/08/17  6:00 PM  Result Value Ref Range Status   MRSA by PCR NEGATIVE NEGATIVE Final    Comment:        The GeneXpert MRSA Assay (FDA approved for NASAL specimens only), is one component of a comprehensive MRSA colonization surveillance program. It is not intended to diagnose MRSA infection nor to guide or monitor treatment for MRSA infections.        Radiology Studies: Dg Chest 1 View  Result Date: 04/08/2017 CLINICAL DATA:  Preoperative examination prior to right hip surgery. EXAM: CHEST 1  VIEW COMPARISON:  03/16/2017 FINDINGS: Cardiomegaly noted. There is no evidence of focal airspace disease, pulmonary edema, suspicious pulmonary nodule/mass, pleural effusion, or pneumothorax. No acute bony abnormalities are identified. Remote left rib fractures again noted. IMPRESSION: Cardiomegaly without evidence of acute cardiopulmonary disease. Electronically Signed   By: Margarette Canada M.D.   On: 04/08/2017 15:43   Pelvis Portable  Result Date: 04/09/2017 CLINICAL DATA:  Postop EXAM: PORTABLE PELVIS 1-2 VIEWS COMPARISON:  04/08/2017, 03/18/2017 FINDINGS: Partially visualized lumbar spine hardware. SI joints are symmetric. Pubic symphysis is intact. Stable left femoral rod across left trochanteric fracture with displaced lesser trochanteric fracture fragment as before. Interval intramedullary rodding of the right intertrochanteric fracture, also with medially displaced lesser trochanteric fracture fragment. Gas in the soft tissues consistent with recent surgical status. IMPRESSION: Interval intramedullary rod fixation of right intertrochanteric fracture. Electronically Signed   By: Donavan Foil M.D.   On: 04/09/2017 20:00   Dg Chest Port 1 View  Result Date: 04/09/2017 CLINICAL DATA:  Central line placement. EXAM: PORTABLE CHEST 1 VIEW COMPARISON:  Chest x-ray of 04/08/2017. FINDINGS: Left IJ central line now in place with tip at the upper margin of the SVC. Cardiomediastinal silhouette is stable. Given the low lung volumes, lungs remain clear. No pleural effusion or pneumothorax seen. IMPRESSION: Left IJ central line in place with tip at the level of the upper SVC. No pneumothorax. Stable cardiomegaly. Electronically Signed   By: Franki Cabot M.D.   On: 04/09/2017 20:05   Dg C-arm 61-120 Min  Result Date: 04/09/2017 CLINICAL DATA:  66 year old female with right femur fracture. Subsequent encounter. EXAM: RIGHT FEMUR 2 VIEWS; DG C-ARM 61-120 MIN COMPARISON:  04/08/2017. FINDINGS: Four intraoperative  C-arm views of the right femur submitted for review after surgery. Placement of right femoral intramedullary rod with proximal sliding type screw and distal fixation screw for treatment of right intertrochanteric fracture. No complication noted on C-arm imaging. This can be assessed on follow-up. IMPRESSION: Open reduction and internal fixation of right intertrochanteric fracture. Electronically Signed   By: Genia Del M.D.   On: 04/09/2017 18:21   Dg Hip Unilat W Or Wo Pelvis 2-3 Views Right  Result Date: 04/08/2017 CLINICAL DATA:  Acute right hip pain following  fall. EXAM: DG HIP (WITH OR WITHOUT PELVIS) 2-3V RIGHT COMPARISON:  03/18/2017 FINDINGS: An acute comminuted intertrochanteric right femur fracture is noted with lesser trochanteric fragment displaced by 6 mm. There is no evidence of subluxation or dislocation. Internal fixation of a left intertrochanteric femur fracture is again noted. IMPRESSION: Acute comminuted intratrochanteric right femur fracture. Electronically Signed   By: Margarette Canada M.D.   On: 04/08/2017 15:34   Dg Femur, Min 2 Views Right  Result Date: 04/09/2017 CLINICAL DATA:  66 year old female with right femur fracture. Subsequent encounter. EXAM: RIGHT FEMUR 2 VIEWS; DG C-ARM 61-120 MIN COMPARISON:  04/08/2017. FINDINGS: Four intraoperative C-arm views of the right femur submitted for review after surgery. Placement of right femoral intramedullary rod with proximal sliding type screw and distal fixation screw for treatment of right intertrochanteric fracture. No complication noted on C-arm imaging. This can be assessed on follow-up. IMPRESSION: Open reduction and internal fixation of right intertrochanteric fracture. Electronically Signed   By: Genia Del M.D.   On: 04/09/2017 18:21   Dg Femur Port, Min 2 Views Right  Result Date: 04/09/2017 CLINICAL DATA:  Postop EXAM: RIGHT FEMUR PORTABLE 2 VIEW COMPARISON:  04/08/2017 FINDINGS: Intramedullary rod fixation of right  intertrochanteric fracture with displaced lesser trochanteric fracture fragment. The rod is fixated distally by a single screw. Hardware appears intact. Gas in the lateral soft tissues consistent with recent surgery IMPRESSION: Intramedullary rod and screw fixation of the right femur for intertrochanteric fracture. Expected postsurgical changes. Electronically Signed   By: Donavan Foil M.D.   On: 04/09/2017 20:02      Scheduled Meds: . buPROPion  300 mg Oral Daily  . DULoxetine  60 mg Oral Daily  . enoxaparin (LOVENOX) injection  40 mg Subcutaneous Q24H  . gabapentin  600 mg Oral TID  . insulin aspart  0-5 Units Subcutaneous QHS  . insulin aspart  0-9 Units Subcutaneous TID WC  . insulin aspart  12 Units Subcutaneous TID WC  . insulin glargine  48 Units Subcutaneous QHS  . lactulose  75 g Oral TID  . magnesium oxide  400 mg Oral Daily  . omega-3 acid ethyl esters  1 g Oral Daily  . pantoprazole  40 mg Oral QHS  . pramipexole  2 mg Oral QHS  . rifaximin  550 mg Oral BID   Continuous Infusions: . sodium chloride 125 mL/hr at 04/10/17 0606  . sodium chloride    . methocarbamol (ROBAXIN)  IV       LOS: 2 days    Time spent: 30 minutes   Dessa Phi, DO [facility] [email or web address] Password [facility] 04/10/2017, 2:46 PM

## 2017-04-10 NOTE — Anesthesia Postprocedure Evaluation (Signed)
Anesthesia Post Note  Patient: Daisy Larson  Procedure(s) Performed: Procedure(s) (LRB): INTRAMEDULLARY (IM) NAIL INTERTROCHANTRIC (Right)     Patient location during evaluation: PACU Anesthesia Type: General Level of consciousness: sedated and patient cooperative Pain management: pain level controlled Vital Signs Assessment: post-procedure vital signs reviewed and stable Respiratory status: spontaneous breathing Cardiovascular status: stable Anesthetic complications: no    Last Vitals:  Vitals:   04/10/17 0500 04/10/17 0600  BP: (!) 96/59 108/68  Pulse: 65 67  Resp: 14 18  Temp:      Last Pain:  Vitals:   04/10/17 0408  TempSrc:   PainSc: [name]

## 2017-04-10 NOTE — Evaluation (Signed)
Occupational Therapy Evaluation Patient Details Name: Daisy Larson MRN: [id number] DOB: [birth year] Today's Date: 04/10/2017    History of Present Illness Pt adm with rt femur fx after fall at SNF. Underwent ORIF on 04/09/17. Pt with recent (3wks ago) lt femur fx with ORIF. PMH - malignant neoplasm of upper-outer quadrant of right breast in female, Charcot foot and diabetic arthropathy status post several surgical interventions, complicated by intra-articular infection status post intra-articular bead placement about 3 weeks ago, nonalcoholic cirrhosis, lt femur fx 03/18/17   Clinical Impression   PTA, pt was completing rehabilitation at SNF level for previous L femur fracture with ORIF. Pt currently presents with lethargy limiting her ability to participate in OT evaluation. She requires total assist for all aspects of ADL and total assist +2 for functional bed mobility in preparation for seated ADL participation. Recommend return to SNF for continued OT services post-acute D/C in order to maximize independence with ADL and functional mobility. OT will continue to follow while admitted.     Follow Up Recommendations  SNF;Supervision/Assistance - 24 hour    Equipment Recommendations  Other (comment) (TBD at next venue of care)    Recommendations for Other Services       Precautions / Restrictions Precautions Precautions: Fall Required Braces or Orthoses: Other Brace/Splint Other Brace/Splint: pt requires diabetic shoe and "special" shoe for L foot s/p antibiotic beads  (per recent chart) Restrictions Weight Bearing Restrictions: Yes RLE Weight Bearing: Non weight bearing LLE Weight Bearing: Weight bearing as tolerated (for transfers only) Other Position/Activity Restrictions: Per ortho progress note      Mobility Bed Mobility Overal bed mobility: Needs Assistance Bed Mobility: Supine to Sit;Sit to Supine     Supine to sit: +2 for physical assistance;Total assist;HOB  elevated Sit to supine: +2 for physical assistance;Total assist   General bed mobility comments: Assist for all aspects. Pt not assisting likely due to lethargy.  Transfers                 General transfer comment: Unable to attempt    Balance Overall balance assessment: Needs assistance Sitting-balance support: Bilateral upper extremity supported;Feet supported Sitting balance-Leahy Scale: Zero Sitting balance - Comments: Sat EOB x 5-6 minutes with total assist of 2. No righting reactions noted.                                    ADL either performed or assessed with clinical judgement   ADL Overall ADL's : Needs assistance/impaired                                       General ADL Comments: Pt currently requiring total assist for all ADL participation.      Vision   Additional Comments: Will continue to assess. Pt basely keeping eyes open this session.      Perception     Praxis      Pertinent Vitals/Pain Pain Assessment: Faces Faces Pain Scale: Hurts whole lot Pain Location: rt hip with any movement Pain Descriptors / Indicators: Grimacing (crying out but then immediately back to sleep) Pain Intervention(s): Limited activity within patient's tolerance;Monitored during session     Hand Dominance Left   Extremity/Trunk Assessment Upper Extremity Assessment Upper Extremity Assessment: Generalized weakness;RUE deficits/detail;LUE deficits/detail RUE Deficits / Details: After significantly increased time, pt did  demonstrate 2/5 grasp strength. Trace movement when asked to move UE on command but did push therapists arm during bed mobility secondary to pain.  LUE Deficits / Details: 2/5 grasp strength; trace strength otherwise. Difficult to assess due to lethargy and decreased ability to follow commands.    Lower Extremity Assessment Lower Extremity Assessment: Defer to PT evaluation RLE Deficits / Details: Limited PROM due to pain.  Pt not actively assisting due to lethargy LLE Deficits / Details: Limited PROM due to pain. Pt not actively assisting due to lethargy       Communication     Cognition Arousal/Alertness: Lethargic Behavior During Therapy: Flat affect Overall Cognitive Status: Difficult to assess Area of Impairment: Following commands;Orientation                 Orientation Level: Disoriented to;Situation;Time (Does know she is at [facility] because she fell)     Following Commands: Follows one step commands with increased time;Follows one step commands inconsistently (Approximately 10% of the session)       General Comments: Pt only arousable for very brief periods and unable to participate. Followed one command during session with significantly increased time and multimodal cues.    General Comments       Exercises     Shoulder Instructions      Home Living Family/patient expects to be discharged to:: Skilled nursing facility                                        Prior Functioning/Environment Level of Independence: Needs assistance  Gait / Transfers Assistance Needed: Pt unable to report. Likely assist for transfers at SNF ADL's / Homemaking Assistance Needed: Pt unable to report.    Comments: Pt lethargic and unable to accurately state        OT Problem List: Decreased strength;Decreased activity tolerance;Decreased range of motion;Impaired balance (sitting and/or standing);Decreased safety awareness;Decreased knowledge of use of DME or AE;Decreased knowledge of precautions;Pain      OT Treatment/Interventions: Self-care/ADL training;Therapeutic exercise;Energy conservation;DME and/or AE instruction;Therapeutic activities;Patient/family education;Balance training;Cognitive remediation/compensation    OT Goals(Current goals can be found in the care plan section) Acute Rehab OT Goals Patient Stated Goal: Pt unable OT Goal Formulation: Patient unable to  participate in goal setting Time For Goal Achievement: 04/24/17 Potential to Achieve Goals: Fair ADL Goals Pt Will Perform Grooming: with mod assist;sitting Pt Will Perform Upper Body Dressing: with mod assist;sitting Pt Will Perform Lower Body Dressing: with max assist;sitting/lateral leans Pt Will Transfer to Toilet: with mod assist;with +2 assist;stand pivot transfer;bedside commode Pt Will Perform Toileting - Clothing Manipulation and hygiene: with max assist;sit to/from stand Additional ADL Goal #1: Pt will complete bed mobiltiy in preparation for ADL tasks seated at EOB with mod assist.   OT Frequency: Min 2X/week   Barriers to D/C:            Co-evaluation PT/OT/SLP Co-Evaluation/Treatment: Yes Reason for Co-Treatment: For patient/therapist safety;Necessary to address cognition/behavior during functional activity PT goals addressed during session: Mobility/safety with mobility;Balance OT goals addressed during session: ADL's and self-care      AM-PAC PT "6 Clicks" Daily Activity     Outcome Measure Help from another person eating meals?: Total Help from another person taking care of personal grooming?: Total Help from another person toileting, which includes using toliet, bedpan, or urinal?: Total Help from another person bathing (including washing,  rinsing, drying)?: Total Help from another person to put on and taking off regular upper body clothing?: Total Help from another person to put on and taking off regular lower body clothing?: Total 6 Click Score: 6   End of Session Equipment Utilized During Treatment: Gait belt;Rolling walker Nurse Communication: Mobility status (Lethargy and soft BP)  Activity Tolerance: Patient tolerated treatment well Patient left: in bed;with call bell/phone within reach  OT Visit Diagnosis: Other abnormalities of gait and mobility (R26.89);Pain;Muscle weakness (generalized) (M62.81);Cognitive communication deficit (R41.841) Pain -  Right/Left: Right (both wtih R>L) Pain - part of body: Hip;Leg                Time: 0962-8366 OT Time Calculation (min): 17 min Charges:  OT General Charges $OT Visit: 1 Procedure OT Evaluation $OT Eval Moderate Complexity: 1 Procedure G-Codes:     Norman Herrlich, MS OTR/L  Pager: [place] A Tarius Stangelo 04/10/2017, 4:46 PM

## 2017-04-10 NOTE — Progress Notes (Signed)
   Subjective:  Patient reports pain as mild to moderate.  Hypotension in PACU - received 2 units PRBCs   Objective:   VITALS:   Vitals:   04/10/17 0400 04/10/17 0500 04/10/17 0600 04/10/17 0733  BP: 102/60 (!) 96/59 108/68 (!) 98/48  Pulse: 66 65 67 66  Resp: 16 14 18 14   Temp:    99.7 F (37.6 C)  TempSrc:    Axillary  SpO2: 91% 99% 100% 93%  Weight:      Height:        NAD, wearing CPAP for sleep Sensation intact distally Intact pulses distally Dorsiflexion/Plantar flexion intact Incision: dressing C/D/I Compartment soft   Lab Results  Component Value Date   WBC 7.1 04/10/2017   HGB 8.5 (L) 04/10/2017   HCT 28.2 (L) 04/10/2017   MCV 99.3 04/10/2017   PLT 98 (L) 04/10/2017   BMET    Component Value Date/Time   NA 136 04/10/2017 0558   NA 138 03/01/2017 1014   K 5.7 (H) 04/10/2017 0558   K 5.2 (H) 03/01/2017 1014   CL 107 04/10/2017 0558   CL 81 (L) 02/25/2013 1300   CO2 26 04/10/2017 0558   CO2 24 03/01/2017 1014   GLUCOSE 240 (H) 04/10/2017 0558   GLUCOSE 200 (H) 03/01/2017 1014   GLUCOSE 552 Repeated and Verified (HH) 02/25/2013 1300   BUN 24 (H) 04/10/2017 0558   BUN 22.4 03/01/2017 1014   CREATININE 1.34 (H) 04/10/2017 0558   CREATININE 1.3 (H) 03/01/2017 1014   CALCIUM 7.4 (L) 04/10/2017 0558   CALCIUM 9.3 03/01/2017 1014   GFRNONAA 40 (L) 04/10/2017 0558   GFRAA 47 (L) 04/10/2017 0558     Assessment/Plan: 1 Day Post-Op   Principal Problem:   Closed displaced comminuted fracture of shaft of right femur (HCC) Active Problems:   Liver cirrhosis secondary to NASH (HCC)   GERD (gastroesophageal reflux disease)   Sleep apnea   Closed left comminuted femur fracture  s/p intramedullary nailing (HCC)   Charcot's joint of foot, left w/ chronic wounds   Diabetes mellitus   Hypertension   Thrombocytopenia (HCC)   Anemia, macrocytic   Dehydration with hyponatremia   Closed comminuted intertrochanteric fracture of proximal end of right femur  ([place])   TDWB RLE, WBAT LLE for xfers DVT ppx: lovenox x 30 days, SCDs, TEDs PO pain control PT/OT Dispo: will need SNF   Xinyi Batton, Horald Pollen 04/10/2017, 8:07 AM   Rod Can, MD Cell [phone]

## 2017-04-10 NOTE — Progress Notes (Signed)
Patient wearing CPAP when RT entered room. Patient resting comfortably at this time. RT will monitor as needed.

## 2017-04-10 NOTE — Progress Notes (Signed)
Orthopedic Tech Progress Note Patient Details:  Daisy Larson Apr 25, 1951 [id number]  Ortho Devices Ortho Device/Splint Location: Trapeze bar Ortho Device/Splint Interventions: Application   [name] 04/10/2017, 9:44 AM

## 2017-04-11 ENCOUNTER — Inpatient Hospital Stay (HOSPITAL_COMMUNITY): Payer: Medicare Other

## 2017-04-11 ENCOUNTER — Encounter (HOSPITAL_COMMUNITY): Payer: Self-pay | Admitting: Orthopedic Surgery

## 2017-04-11 LAB — BASIC METABOLIC PANEL
ANION GAP: 6 (ref 5–15)
BUN: 22 mg/dL — AB (ref 6–20)
CHLORIDE: 114 mmol/L — AB (ref 101–111)
CO2: 23 mmol/L (ref 22–32)
Calcium: 6.7 mg/dL — ABNORMAL LOW (ref 8.9–10.3)
Creatinine, Ser: 1.2 mg/dL — ABNORMAL HIGH (ref 0.44–1.00)
GFR calc Af Amer: 53 mL/min — ABNORMAL LOW (ref >60)
GFR calc non Af Amer: 46 mL/min — ABNORMAL LOW (ref >60)
Glucose, Bld: 246 mg/dL — ABNORMAL HIGH (ref 65–99)
POTASSIUM: 4 mmol/L (ref 3.5–5.1)
SODIUM: 143 mmol/L (ref 135–145)

## 2017-04-11 LAB — CBC
HCT: 26 % — ABNORMAL LOW (ref 36.0–46.0)
HEMOGLOBIN: 7.7 g/dL — AB (ref 12.0–15.0)
MCH: 30.1 pg (ref 26.0–34.0)
MCHC: 29.6 g/dL — ABNORMAL LOW (ref 30.0–36.0)
MCV: 101.6 fL — AB (ref 78.0–100.0)
Platelets: 89 10*3/uL — ABNORMAL LOW (ref 150–400)
RBC: 2.56 MIL/uL — AB (ref 3.87–5.11)
RDW: 17.9 % — ABNORMAL HIGH (ref 11.5–15.5)
WBC: 4.8 10*3/uL (ref 4.0–10.5)

## 2017-04-11 LAB — URINALYSIS, ROUTINE W REFLEX MICROSCOPIC
Bilirubin Urine: NEGATIVE
GLUCOSE, UA: NEGATIVE mg/dL
Ketones, ur: NEGATIVE mg/dL
Nitrite: NEGATIVE
PH: 5 (ref 5.0–8.0)
Protein, ur: 30 mg/dL — AB
SPECIFIC GRAVITY, URINE: 1.024 (ref 1.005–1.030)

## 2017-04-11 LAB — GLUCOSE, CAPILLARY
GLUCOSE-CAPILLARY: 323 mg/dL — AB (ref 65–99)
Glucose-Capillary: 231 mg/dL — ABNORMAL HIGH (ref 65–99)
Glucose-Capillary: 223 mg/dL — ABNORMAL HIGH (ref 65–99)

## 2017-04-11 MED ORDER — SODIUM CHLORIDE 0.9 % IV SOLN
INTRAVENOUS | Status: DC
  Administered 2017-04-11 – 2017-04-12 (×2): via INTRAVENOUS

## 2017-04-11 MED ORDER — HALOPERIDOL LACTATE 5 MG/ML IJ SOLN
2.0000 mg | Freq: Four times a day (QID) | INTRAMUSCULAR | Status: DC | PRN
  Administered 2017-04-11 – 2017-04-12 (×2): 2 mg via INTRAVENOUS
  Filled 2017-04-11 (×2): qty 1

## 2017-04-11 NOTE — Progress Notes (Signed)
PROGRESS NOTE    [name]  WUJ:[id number] DOB: 16-Sep-1951 DOA: 04/08/2017 PCP: [name], MD     Brief Narrative:  Daisy Larson is a 66 y.o. 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 She was at the nursing facility when she got out of bed unassisted to obtain a [place] book and fell. She is experiencing muscle spasms and right upper leg pain with external rotation and shortening of the right lower extremity. She was given 150 g of fentanyl by EMS prior to arrival. Imaging obtained in the ER reveals comminuted fracture of the right femur. Patient underwent right intramedullary fixation 6/11.   Assessment & Plan:   Principal Problem:   Closed displaced comminuted fracture of shaft of right femur ([place]) Active Problems:   Liver cirrhosis secondary to NASH (HCC)   GERD (gastroesophageal reflux disease)   Sleep apnea   Closed left comminuted femur fracture  s/p intramedullary nailing (HCC)   Charcot's joint of foot, left w/ chronic wounds   Diabetes mellitus   Hypertension   Thrombocytopenia (HCC)   Anemia, macrocytic   Dehydration with hyponatremia   Closed comminuted intertrochanteric fracture of proximal end of right femur (HCC)  Closed displaced comminuted fracture of shaft of right femur  -Presents with mechanical fall after unassisted ambulation resulting in femur fracture -Underwent right intramedullary fixation on 6/11 with Dr. [name]. Had hypotension in PACU post operatively, received 2u pRBC  -PT OT recommending SNF   Acute encephalopathy, likely combination of hepatic encephalopathy, meds -Patient has history of  postoperative encephalopathy in the past -Continue rifaximin, lactulose for goal of 3 bowel movements daily -Limit narcotics  -Haldol prn for agitation/threat to others or self   Fever -100.4 overnight.  -UA obtained without infection -CXR obtained stable -Blood cultures pending  Hyperkalemia -Resolved   CKD stage 3 -Baseline Cr 1.1-1.3 -Stable   Liver cirrhosis secondary to NASH, with hx hepatic encephalopathy  -Hold preadmission lasix, spironolactone due to low BP   OSA -Continue nocturnal CPAP   Diabetes mellitus -Hemoglobin A1c was 6.6 on 5/19 -Lantus + Novolog SSI  -DM coordinator consult   Hypertension -Hold preadmission lisinopril, lasix, spironolactone due to low BP   GERD  -Continue Protonix  Charcot's joint of foot, left w/ chronic wounds -Chronic issue followed at [facility] -Presumed hardware infection and has had intra-articular antibody beads placed -Prior to last admission family was using Betadine for wound care at home -She is to follow-up with her primary surgeon Dr. [name] at [facility] in [place]  Thrombocytopenia  -Was on Lovenox for DVT prophylaxis at nursing facility -Stable, monitor   Anemia, macrocytic -Hemoglobin stable and at baseline around 8 -Had hypotension in PACU post operatively, received 2u pRBC   Restless leg syndrome -Continue Mirapex  Depression -Continue wellbutrin, cymbalta     DVT prophylaxis: SCDs Code Status: Full Family Communication: Spoke with husband over the phone this morning with updates Disposition Plan: SNF    Consultants:   Orthopedic surgery  Procedures:   None  Antimicrobials:  Anti-infectives    Start     Dose/Rate Route Frequency Ordered Stop   04/09/17 2330  ceFAZolin (ANCEF) IVPB 2g/100 mL premix     2  g 200 mL/hr over 30 Minutes Intravenous Every 6 hours 04/09/17 2224 04/10/17 0633   04/09/17 0600  ceFAZolin (ANCEF) IVPB 2g/100 mL premix     2  g 200 mL/hr over 30 Minutes Intravenous On call to O.R. 04/08/17 1733 04/09/17 1633   04/08/17 2200  rifaximin (XIFAXAN) tablet 550 mg     550 mg Oral 2 times daily 04/08/17 1737         Subjective: Patient is quite sleepy and sleeping comfortably in bed on CPAP. She is a little bit more awake today, although still very lethargic. She denies any pain and overall has no complaints. Is oriented to self, place, time.  After my rounds this morning, I received a call from RN. Patient had been very agitated, screaming, refusing oral medications, unable to reorient.  Objective: Vitals:   04/11/17 1000 04/11/17 1100 04/11/17 1200 04/11/17 1313  BP: 107/63 114/81 (!) 103/55   Pulse: 85 88 86   Resp: 18 (!) 26 17   Temp:    98.4 F (36.9 C)  TempSrc:    Oral  SpO2: 98% 99% 96%   Weight:      Height:        Intake/Output Summary (Last 24 hours) at 04/11/17 1337 Last data filed at 04/11/17 1318  Gross per 24 hour  Intake          2231.25 ml  Output              860 ml  Net          1371.25 ml   Filed Weights   04/08/17 1733 04/09/17 2220  Weight: 122.3 kg (269 lb 10 oz) 126.3 kg (278 lb 7.1 oz)    Examination: General exam: Appears calm and comfortable  Respiratory system: Clear to auscultation. Respiratory effort normal. On cpap  Cardiovascular system: S1 & S2 heard, RRR. No JVD, murmurs, rubs, gallops or clicks. No pedal edema. Gastrointestinal system: Abdomen is nondistended, soft and nontender. No organomegaly or masses felt. Normal bowel sounds heard. Central nervous system: Patient alert to voice but falls asleep. Denies any complaints, although does not stay awake long enough to follow commands appropriately. Is oriented to place and time. Extremities: No edema or cyanosis  Skin: No rashes, lesions or ulcers on visible skin   Data Reviewed: I have personally reviewed following labs and imaging studies  CBC:  Recent Labs Lab 04/08/17 1535 04/09/17 0332 04/10/17 0558  04/11/17 0658  WBC 4.1 5.2 7.1 4.8  NEUTROABS 3.0  --   --   --   HGB 8.5* 8.0* 8.5* 7.7*  HCT 27.9* 27.3* 28.2* 26.0*  MCV 100.7* 101.9* 99.3 101.6*  PLT 91* 97* 98* 89*   Basic Metabolic Panel:  Recent Labs Lab 04/08/17 1535 04/09/17 0332 04/09/17 0814 04/09/17 2230 04/10/17 0558 04/10/17 1300 04/11/17 0658  NA 134* 135  --   --  136 137 143  K 5.2* 6.0* 5.8* 5.8* 5.7* 5.3* 4.0  CL 104 103  --   --  107 108 114*  CO2 25 28  --   --  26 26 23   GLUCOSE 151* 169*  --   --  240* 252* 246*  BUN 13 17  --   --  24* 23* 22*  CREATININE 1.18* 1.25*  --   --  1.34* 1.28* 1.20*  CALCIUM 8.8* 8.0*  --   --  7.4* 7.4* 6.7*   GFR: Estimated Creatinine Clearance: 64.7 mL/min (A) (by C-G formula  based on SCr of 1.2 mg/dL (H)). Liver Function Tests: No results for input(s): AST, ALT, ALKPHOS, BILITOT, PROT, ALBUMIN in the last 168 hours. No results for input(s): LIPASE, AMYLASE in the last 168 hours.  Recent Labs Lab 04/08/17 1800  AMMONIA 62*   Coagulation Profile: No results for input(s): INR, PROTIME in the last 168 hours. Cardiac Enzymes: No results for input(s): CKTOTAL, CKMB, CKMBINDEX, TROPONINI in the last 168 hours. BNP (last 3 results) No results for input(s): PROBNP in the last 8760 hours. HbA1C: No results for input(s): HGBA1C in the last 72 hours. CBG:  Recent Labs Lab 04/10/17 0859 04/10/17 1206 04/10/17 1638 04/10/17 2229 04/11/17 0834  GLUCAP 220* 248* 185* 200* 231*   Lipid Profile: No results for input(s): CHOL, HDL, LDLCALC, TRIG, CHOLHDL, LDLDIRECT in the last 72 hours. Thyroid Function Tests: No results for input(s): TSH, T4TOTAL, FREET4, T3FREE, THYROIDAB in the last 72 hours. Anemia Panel: No results for input(s): VITAMINB12, FOLATE, FERRITIN, TIBC, IRON, RETICCTPCT in the last 72 hours. Sepsis Labs: No results for input(s): PROCALCITON, LATICACIDVEN in the last 168 hours.  Recent Results (from the past 240 hour(s))  MRSA PCR Screening      Status: None   Collection Time: 04/08/17  6:00 PM  Result Value Ref Range Status   MRSA by PCR NEGATIVE NEGATIVE Final    Comment:        The GeneXpert MRSA Assay (FDA approved for NASAL specimens only), is one component of a comprehensive MRSA colonization surveillance program. It is not intended to diagnose MRSA infection nor to guide or monitor treatment for MRSA infections.        Radiology Studies: Pelvis Portable  Result Date: 04/09/2017 CLINICAL DATA:  Postop EXAM: PORTABLE PELVIS 1-2 VIEWS COMPARISON:  04/08/2017, 03/18/2017 FINDINGS: Partially visualized lumbar spine hardware. SI joints are symmetric. Pubic symphysis is intact. Stable left femoral rod across left trochanteric fracture with displaced lesser trochanteric fracture fragment as before. Interval intramedullary rodding of the right intertrochanteric fracture, also with medially displaced lesser trochanteric fracture fragment. Gas in the soft tissues consistent with recent surgical status. IMPRESSION: Interval intramedullary rod fixation of right intertrochanteric fracture. Electronically Signed   By: Donavan Foil M.D.   On: 04/09/2017 20:00   Dg Chest Port 1 View  Result Date: 04/11/2017 CLINICAL DATA:  Fevers EXAM: PORTABLE CHEST 1 VIEW COMPARISON:  04/09/2017 FINDINGS: Cardiac shadow is again enlarged. Left jugular central line is noted in the innominate vein. The lungs are well aerated bilaterally with evidence of central vascular congestion. No focal infiltrate or significant pulmonary edema is noted. Mild left basilar atelectasis is seen. IMPRESSION: Mild vascular congestion and left basilar atelectasis. No significant edema is noted. Electronically Signed   By: Inez Catalina M.D.   On: 04/11/2017 08:06   Dg Chest Port 1 View  Result Date: 04/09/2017 CLINICAL DATA:  Central line placement. EXAM: PORTABLE CHEST 1 VIEW COMPARISON:  Chest x-ray of 04/08/2017. FINDINGS: Left IJ central line now in place with tip at  the upper margin of the SVC. Cardiomediastinal silhouette is stable. Given the low lung volumes, lungs remain clear. No pleural effusion or pneumothorax seen. IMPRESSION: Left IJ central line in place with tip at the level of the upper SVC. No pneumothorax. Stable cardiomegaly. Electronically Signed   By: Franki Cabot M.D.   On: 04/09/2017 20:05   Dg C-arm 61-120 Min  Result Date: 04/09/2017 CLINICAL DATA:  66 year old female with right femur fracture. Subsequent encounter. EXAM: RIGHT FEMUR 2  VIEWS; DG C-ARM 61-120 MIN COMPARISON:  04/08/2017. FINDINGS: Four intraoperative C-arm views of the right femur submitted for review after surgery. Placement of right femoral intramedullary rod with proximal sliding type screw and distal fixation screw for treatment of right intertrochanteric fracture. No complication noted on C-arm imaging. This can be assessed on follow-up. IMPRESSION: Open reduction and internal fixation of right intertrochanteric fracture. Electronically Signed   By: Genia Del M.D.   On: 04/09/2017 18:21   Dg Femur, Min 2 Views Right  Result Date: 04/09/2017 CLINICAL DATA:  66 year old female with right femur fracture. Subsequent encounter. EXAM: RIGHT FEMUR 2 VIEWS; DG C-ARM 61-120 MIN COMPARISON:  04/08/2017. FINDINGS: Four intraoperative C-arm views of the right femur submitted for review after surgery. Placement of right femoral intramedullary rod with proximal sliding type screw and distal fixation screw for treatment of right intertrochanteric fracture. No complication noted on C-arm imaging. This can be assessed on follow-up. IMPRESSION: Open reduction and internal fixation of right intertrochanteric fracture. Electronically Signed   By: Genia Del M.D.   On: 04/09/2017 18:21   Dg Femur Port, Min 2 Views Right  Result Date: 04/09/2017 CLINICAL DATA:  Postop EXAM: RIGHT FEMUR PORTABLE 2 VIEW COMPARISON:  04/08/2017 FINDINGS: Intramedullary rod fixation of right intertrochanteric  fracture with displaced lesser trochanteric fracture fragment. The rod is fixated distally by a single screw. Hardware appears intact. Gas in the lateral soft tissues consistent with recent surgery IMPRESSION: Intramedullary rod and screw fixation of the right femur for intertrochanteric fracture. Expected postsurgical changes. Electronically Signed   By: Donavan Foil M.D.   On: 04/09/2017 20:02      Scheduled Meds: . buPROPion  300 mg Oral Daily  . DULoxetine  60 mg Oral Daily  . enoxaparin (LOVENOX) injection  40 mg Subcutaneous Q24H  . insulin aspart  0-5 Units Subcutaneous QHS  . insulin aspart  0-9 Units Subcutaneous TID WC  . insulin aspart  12 Units Subcutaneous TID WC  . insulin glargine  48 Units Subcutaneous QHS  . lactulose  75 g Oral TID  . magnesium oxide  400 mg Oral Daily  . omega-3 acid ethyl esters  1 g Oral Daily  . pantoprazole  40 mg Oral QHS  . pramipexole  2 mg Oral QHS  . rifaximin  550 mg Oral BID   Continuous Infusions: . sodium chloride    . methocarbamol (ROBAXIN)  IV       LOS: 3 days    Time spent: 30 minutes   Dessa Phi, DO [facility] [email or web address] Password [email or web address] 04/11/2017, 1:37 PM

## 2017-04-11 NOTE — Progress Notes (Signed)
             []  Hide copied text [] Hover for attribution information Had 3 loose large loose stools since 0650 - pt refused all am meds was taken off Cpap at 0900 placed on 4 l n/c 02 sats 96% Pt unable to follow any commands screaming I want to go home. Tried to repeatedly calm patient by reorienting. Pt able to repeat where she currently is - than 2 min later she can't remember where she is. Only oriented to name and birthday. Text paged and spoke to Dr Maylene Roes about pt screaming and refusing her am meds also updated on liquid stool situation. Tried to engage pt in crossword puzzle & TV to no avail.     Deleted by: Iline Oven, RN at 04/11/2017 10:51 AM  Chart Correction History

## 2017-04-11 NOTE — NC FL2 (Signed)
[place] LEVEL OF CARE SCREENING TOOL     IDENTIFICATION  Patient Name: Daisy Larson Birthdate: [birth year] Sex: female Admission Date (Current Location): 04/08/2017  [facility] and [place] Number:  Herbalist and Address:  The [place]. [facility], [place] [address] [place], [place] [address]      Provider Number: [id number]  Attending Physician Name and Address:  Dessa Phi Chahn-Yan*  Relative Name and Phone Number:       Current Level of Care: Hospital Recommended Level of Care: [place] Prior Approval Number:    Date Approved/Denied:   PASRR Number:    Discharge Plan: SNF    Current Diagnoses: Patient Active Problem List   Diagnosis Date Noted  . Closed comminuted intertrochanteric fracture of proximal end of right femur ([place]) 04/09/2017  . Liver cirrhosis secondary to NASH ([place]) 04/08/2017  . GERD (gastroesophageal reflux disease) 04/08/2017  . Sleep apnea 04/08/2017  . Closed left comminuted femur fracture  s/p intramedullary nailing ([place]) 04/08/2017  . Charcot's joint of foot, left w/ chronic wounds 04/08/2017  . Diabetes mellitus 04/08/2017  . Hypertension 04/08/2017  . Thrombocytopenia ([place]) 04/08/2017  . Anemia, macrocytic 04/08/2017  . Closed displaced comminuted fracture of shaft of right femur ([place]) 04/08/2017  . Dehydration with hyponatremia 04/08/2017  . Closed comminuted intertrochanteric fracture of left femur ([place]) 03/18/2017  . Hyperkalemia 03/16/2017  . Charcot ankle, left 03/16/2017  . Other cirrhosis of liver ([place]) 03/16/2017  . Hip fracture ([place]) 03/16/2017  . Prolonged QT interval 03/16/2017  . Hx of Bell's palsy 08/27/2014  . Essential hypertension 08/27/2014  . History of cardiac dysrhythmia 08/27/2014  . Charcot's joint of right foot 08/27/2014  . Chronic hypercapnic respiratory failure ([place]) 08/27/2014  . Long-term insulin use ([place]) 08/27/2014  . Depression, major, recurrent  ([place]) 08/27/2014  . Nontoxic multinodular goiter 06/24/2014  . Osteopenia 11/13/2013  . Thrombocytopenia, unspecified ([place]) 05/12/2013  . Iron deficiency anemia 10/16/2012  . Encephalopathy acute 10/09/2012  . OSA (obstructive sleep apnea) 10/09/2012  . 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 Pica in adults 10/08/2012  . Anemia 10/08/2012  . Syncope 10/07/2012  . History of breast cancer in female 01/19/2012  . Malignant neoplasm of upper-outer quadrant of right breast in female, estrogen receptor positive ([place]) 01/09/2012  . Type 2 diabetes, uncontrolled, with neuropathy ([place]) 12/27/2010  . RESTLESS LEGS SYNDROME 12/27/2010  . Diabetic polyneuropathy ([place]) 12/27/2010  . DM2 (diabetes mellitus, type 2) ([place]) 09/14/2010  . HYPERLIPIDEMIA, MIXED, WITH HIGH HDL 09/14/2010  . Essential hypertension, benign 09/14/2010  . PERSONAL HISTORY OF COLONIC POLYPS 09/14/2010    Orientation RESPIRATION BLADDER Height & Weight     Self  O2 (CPAP) Indwelling catheter, Continent Weight: 126.3 kg (278 lb 7.1 oz) Height:  5\' 8"  (172.7 cm)  BEHAVIORAL SYMPTOMS/MOOD NEUROLOGICAL BOWEL NUTRITION STATUS      Incontinent Diet (Please see DC Summary)  AMBULATORY STATUS COMMUNICATION OF NEEDS Skin     Verbally Surgical wounds (Closed incision on thigh)                       Personal Care Assistance Level of Assistance  Bathing, Feeding, Dressing           Functional Limitations Info  Sight, Hearing, Speech Sight Info: Adequate Hearing Info: Adequate Speech Info: Adequate    SPECIAL CARE FACTORS FREQUENCY  PT (By licensed PT), OT (By licensed OT)     PT Frequency:  5x/week OT Frequency: 5x/week            Contractures Contractures Info: Not present    Additional Factors Info  Code Status Code Status Info: Full             Current Medications (04/11/2017):  This is the current hospital active medication list Current Facility-Administered Medications  Medication Dose  Route Frequency Provider Last Rate Last Dose  . 0.9 %  sodium chloride infusion   Intravenous Once Nolon Nations, MD      . acetaminophen (TYLENOL) tablet 650 mg  650 mg Oral Q6H PRN Schorr, Rhetta Mura, NP   650 mg at 04/10/17 2159  . buPROPion (WELLBUTRIN XL) 24 hr tablet 300 mg  300 mg Oral Daily Samella Parr, NP   300 mg at 04/11/17 0950  . DULoxetine (CYMBALTA) DR capsule 60 mg  60 mg Oral Daily Samella Parr, NP   60 mg at 04/11/17 0952  . enoxaparin (LOVENOX) injection 40 mg  40 mg Subcutaneous Q24H Rod Can, MD   40 mg at 04/11/17 0945  . fluticasone (FLONASE) 50 MCG/ACT nasal spray 1 spray  1 spray Each Nare Daily PRN Samella Parr, NP      . haloperidol lactate (HALDOL) injection 2 mg  2 mg Intravenous Q6H PRN Dessa Phi Chahn-Yang, DO   2 mg at 04/11/17 1101  . HYDROcodone-acetaminophen (NORCO/VICODIN) 5-325 MG per tablet 1-2 tablet  1-2 tablet Oral Q6H PRN Swinteck, Aaron Edelman, MD      . insulin aspart (novoLOG) injection 0-5 Units  0-5 Units Subcutaneous QHS Samella Parr, NP   2 Units at 04/09/17 2328  . insulin aspart (novoLOG) injection 0-9 Units  0-9 Units Subcutaneous TID WC Samella Parr, NP   3 Units at 04/11/17 0948  . insulin aspart (novoLOG) injection 12 Units  12 Units Subcutaneous TID WC Dessa Phi Chahn-Yang, DO   12 Units at 04/11/17 0948  . insulin glargine (LANTUS) injection 48 Units  48 Units Subcutaneous QHS Dessa Phi [place], DO   48 Units at 04/10/17 2205  . lactulose (CHRONULAC) 10 GM/15ML solution 75 g  75 g Oral TID Dessa Phi Chahn-Yang, DO   75 g at 04/10/17 2206  . magnesium oxide (MAG-OX) tablet 400 mg  400 mg Oral Daily Elwin Mocha, MD   400 mg at 04/11/17 0951  . menthol-cetylpyridinium (CEPACOL) lozenge 3 mg  1 lozenge Oral PRN Swinteck, Aaron Edelman, MD       Or  . phenol (CHLORASEPTIC) mouth spray 1 spray  1 spray Mouth/Throat PRN Swinteck, Aaron Edelman, MD      . methocarbamol (ROBAXIN) 500 mg in dextrose 5 % 50 mL IVPB   500 mg Intravenous Q6H PRN Dessa Phi Chahn-Yang, DO       Or  . methocarbamol (ROBAXIN) tablet 500 mg  500 mg Oral Q6H PRN Dessa Phi Chahn-Yang, DO   500 mg at 04/10/17 2158  . metoCLOPramide (REGLAN) tablet 5-10 mg  5-10 mg Oral Q8H PRN Swinteck, Aaron Edelman, MD       Or  . metoCLOPramide (REGLAN) injection 5-10 mg  5-10 mg Intravenous Q8H PRN Swinteck, Aaron Edelman, MD      . omega-3 acid ethyl esters (LOVAZA) capsule 1 g  1 g Oral Daily Elwin Mocha, MD   1 g at 04/11/17 1009  . ondansetron (ZOFRAN) tablet 4 mg  4 mg Oral Q6H PRN Rod Can, MD       Or  . ondansetron [facility]) injection  4 mg  4 mg Intravenous Q6H PRN Swinteck, Aaron Edelman, MD      . oxyCODONE (Oxy IR/ROXICODONE) immediate release tablet 5-10 mg  5-10 mg Oral Q4H PRN Samella Parr, NP   10 mg at 04/09/17 0603  . pantoprazole (PROTONIX) EC tablet 40 mg  40 mg Oral QHS Samella Parr, NP   40 mg at 04/10/17 2158  . polyethylene glycol (MIRALAX / GLYCOLAX) packet 17 g  17 g Oral TID PRN Samella Parr, NP      . pramipexole (MIRAPEX) tablet 2 mg  2 mg Oral QHS Samella Parr, NP   2 mg at 04/10/17 2159  . rifaximin (XIFAXAN) tablet 550 mg  550 mg Oral BID Samella Parr, NP   550 mg at 04/11/17 0263     Discharge Medications: Please see discharge summary for a list of discharge medications.  Relevant Imaging Results:  Relevant Lab Results:   Additional Information SS # [id number]  Benard Halsted, LCSWA

## 2017-04-11 NOTE — Progress Notes (Signed)
RT placed patient on CPAP with 4L O2 bled into circuit. Patient tolerating well at this time. RT will monitor as needed. 

## 2017-04-11 NOTE — Progress Notes (Signed)
   Subjective:  Patient reports pain as mild to moderate.   Objective:   VITALS:   Vitals:   04/10/17 2100 04/10/17 2124 04/11/17 0009 04/11/17 0426  BP: (!) 99/55 (!) 99/55 (!) 87/44 (!) 107/49  Pulse: 83 85 84 87  Resp: 16 18 20    Temp: (!) 100.4 F (38 C)  99.2 F (37.3 C) 98.8 F (37.1 C)  TempSrc: Axillary  Axillary Axillary  SpO2: 95% 94% 99% 98%  Weight:      Height:        NAD, wearing CPAP for sleep Sensation intact distally Intact pulses distally Dorsiflexion/Plantar flexion intact Incision: dressing C/D/I Compartment soft   Lab Results  Component Value Date   WBC 4.8 04/11/2017   HGB 7.7 (L) 04/11/2017   HCT 26.0 (L) 04/11/2017   MCV 101.6 (H) 04/11/2017   PLT 89 (L) 04/11/2017   BMET    Component Value Date/Time   NA 143 04/11/2017 0658   NA 138 03/01/2017 1014   K 4.0 04/11/2017 0658   K 5.2 (H) 03/01/2017 1014   CL 114 (H) 04/11/2017 0658   CL 81 (L) 02/25/2013 1300   CO2 23 04/11/2017 0658   CO2 24 03/01/2017 1014   GLUCOSE 246 (H) 04/11/2017 0658   GLUCOSE 200 (H) 03/01/2017 1014   GLUCOSE 552 Repeated and Verified (HH) 02/25/2013 1300   BUN 22 (H) 04/11/2017 0658   BUN 22.4 03/01/2017 1014   CREATININE 1.20 (H) 04/11/2017 0658   CREATININE 1.3 (H) 03/01/2017 1014   CALCIUM 6.7 (L) 04/11/2017 0658   CALCIUM 9.3 03/01/2017 1014   GFRNONAA 46 (L) 04/11/2017 0658   GFRAA 53 (L) 04/11/2017 0658     Assessment/Plan: 2 Days Post-Op   Principal Problem:   Closed displaced comminuted fracture of shaft of right femur (HCC) Active Problems:   Liver cirrhosis secondary to NASH (HCC)   GERD (gastroesophageal reflux disease)   Sleep apnea   Closed left comminuted femur fracture  s/p intramedullary nailing (HCC)   Charcot's joint of foot, left w/ chronic wounds   Diabetes mellitus   Hypertension   Thrombocytopenia (HCC)   Anemia, macrocytic   Dehydration with hyponatremia   Closed comminuted intertrochanteric fracture of proximal end  of right femur (HCC)   TDWB RLE, WBAT LLE for xfers DVT ppx: lovenox x 30 days, SCDs, TEDs PO pain control PT/OT Dispo: will need SNF   Mailin Coglianese, Horald Pollen 04/11/2017, 7:52 AM   Rod Can, MD Cell [phone]

## 2017-04-12 LAB — BLOOD CULTURE ID PANEL (REFLEXED)
Acinetobacter baumannii: NOT DETECTED
CANDIDA GLABRATA: NOT DETECTED
CANDIDA KRUSEI: NOT DETECTED
CANDIDA PARAPSILOSIS: NOT DETECTED
Candida albicans: NOT DETECTED
Candida tropicalis: NOT DETECTED
ENTEROBACTER CLOACAE COMPLEX: NOT DETECTED
ENTEROCOCCUS SPECIES: NOT DETECTED
ESCHERICHIA COLI: NOT DETECTED
Enterobacteriaceae species: NOT DETECTED
Haemophilus influenzae: NOT DETECTED
KLEBSIELLA PNEUMONIAE: NOT DETECTED
Klebsiella oxytoca: NOT DETECTED
LISTERIA MONOCYTOGENES: NOT DETECTED
Methicillin resistance: DETECTED — AB
Neisseria meningitidis: NOT DETECTED
Proteus species: NOT DETECTED
Pseudomonas aeruginosa: NOT DETECTED
STAPHYLOCOCCUS AUREUS BCID: NOT DETECTED
STAPHYLOCOCCUS SPECIES: DETECTED — AB
STREPTOCOCCUS AGALACTIAE: NOT DETECTED
Serratia marcescens: NOT DETECTED
Streptococcus pneumoniae: NOT DETECTED
Streptococcus pyogenes: NOT DETECTED
Streptococcus species: NOT DETECTED

## 2017-04-12 LAB — CBC
HCT: 22.9 % — ABNORMAL LOW (ref 36.0–46.0)
Hemoglobin: 7 g/dL — ABNORMAL LOW (ref 12.0–15.0)
MCH: 30.2 pg (ref 26.0–34.0)
MCHC: 30.6 g/dL (ref 30.0–36.0)
MCV: 98.7 fL (ref 78.0–100.0)
PLATELETS: 70 10*3/uL — AB (ref 150–400)
RBC: 2.32 MIL/uL — AB (ref 3.87–5.11)
RDW: 17 % — AB (ref 11.5–15.5)
WBC: 3.7 10*3/uL — AB (ref 4.0–10.5)

## 2017-04-12 LAB — BASIC METABOLIC PANEL
ANION GAP: 5 (ref 5–15)
BUN: 17 mg/dL (ref 6–20)
CALCIUM: 6.8 mg/dL — AB (ref 8.9–10.3)
CO2: 23 mmol/L (ref 22–32)
Chloride: 109 mmol/L (ref 101–111)
Creatinine, Ser: 0.93 mg/dL (ref 0.44–1.00)
GFR calc non Af Amer: >60 mL/min (ref >60)
Glucose, Bld: 202 mg/dL — ABNORMAL HIGH (ref 65–99)
POTASSIUM: 3.6 mmol/L (ref 3.5–5.1)
SODIUM: 137 mmol/L (ref 135–145)

## 2017-04-12 LAB — GLUCOSE, CAPILLARY
GLUCOSE-CAPILLARY: 307 mg/dL — AB (ref 65–99)
GLUCOSE-CAPILLARY: 246 mg/dL — AB (ref 65–99)
GLUCOSE-CAPILLARY: 272 mg/dL — AB (ref 65–99)
Glucose-Capillary: 256 mg/dL — ABNORMAL HIGH (ref 65–99)

## 2017-04-12 LAB — PREPARE RBC (CROSSMATCH)

## 2017-04-12 MED ORDER — VANCOMYCIN HCL IN DEXTROSE 1-5 GM/200ML-% IV SOLN
1000.0000 mg | Freq: Two times a day (BID) | INTRAVENOUS | Status: DC
  Administered 2017-04-13 – 2017-04-14 (×3): 1000 mg via INTRAVENOUS
  Filled 2017-04-12 (×5): qty 200

## 2017-04-12 MED ORDER — VANCOMYCIN HCL 10 G IV SOLR
2500.0000 mg | Freq: Once | INTRAVENOUS | Status: AC
  Administered 2017-04-12: 2500 mg via INTRAVENOUS
  Filled 2017-04-12: qty 2500

## 2017-04-12 MED ORDER — GABAPENTIN 300 MG PO CAPS
600.0000 mg | ORAL_CAPSULE | Freq: Once | ORAL | Status: AC
  Administered 2017-04-12: 600 mg via ORAL
  Filled 2017-04-12: qty 2

## 2017-04-12 MED ORDER — SODIUM CHLORIDE 0.9 % IV SOLN
Freq: Once | INTRAVENOUS | Status: AC
  Administered 2017-04-12: 11:00:00 via INTRAVENOUS

## 2017-04-12 MED ORDER — ACETAMINOPHEN 325 MG PO TABS
650.0000 mg | ORAL_TABLET | Freq: Four times a day (QID) | ORAL | Status: AC | PRN
  Administered 2017-04-12 – 2017-04-13 (×2): 650 mg via ORAL
  Filled 2017-04-12 (×2): qty 2

## 2017-04-12 NOTE — Progress Notes (Addendum)
PROGRESS NOTE    Daisy Larson  PJK:[id number] DOB: 08/19/1951 DOA: 04/08/2017 PCP: [name], MD     Brief Narrative:  Daisy Larson is a 66 y.o. 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 She was at the nursing facility when she got out of bed unassisted to obtain a [place] book and fell. She is experiencing muscle spasms and right upper leg pain with external rotation and shortening of the right lower extremity. She was given 150 g of fentanyl by EMS prior to arrival. Imaging obtained in the ER reveals comminuted fracture of the right femur. Patient underwent right intramedullary fixation 6/11. Postoperatively complicated by hypotension, required 2u pRBC, then had few days of encephalopathy likely in combination of pain medications, anesthesia, and hepatic encephalopathy.   Assessment & Plan:   Principal Problem:   Closed displaced comminuted fracture of shaft of right femur ([place]) Active Problems:   Liver cirrhosis secondary to NASH (HCC)   GERD (gastroesophageal reflux disease)   Sleep apnea   Closed left comminuted femur fracture  s/p intramedullary nailing (HCC)   Charcot's joint of foot, left w/ chronic wounds   Diabetes mellitus   Hypertension   Thrombocytopenia (HCC)   Anemia, macrocytic   Dehydration with hyponatremia   Closed comminuted intertrochanteric fracture of proximal end of right femur (HCC)  Closed displaced comminuted fracture of shaft of right femur  -Presents with mechanical fall after unassisted ambulation resulting in femur fracture -Underwent right intramedullary fixation on 6/11 with Dr. [name]. Had  hypotension in PACU post operatively, received 2u pRBC  -PT OT recommending SNF   Acute encephalopathy, likely combination of hepatic encephalopathy, meds -Patient has history of postoperative encephalopathy in the past -Continue rifaximin, lactulose for goal of 3 bowel movements daily -Limit narcotics  -Haldol prn for agitation/threat to others or self  -Had episodes of agitation, hallucinations overnight and this morning, but on my exam much improved, having BM now, tired but conversational   Sepsis secondary to GPC bacteremia  -UA obtained without infection -CXR obtained stable -Blood cultures with 2 of 2 gram positive cocci - identification pending. Start vanco  -Surgical site dressing clean and dry, no erythema  -Remove central line  -No further fevers   CKD stage 3 -Baseline Cr 1.1-1.3 -Stable   Liver cirrhosis secondary to NASH, with hx hepatic encephalopathy  -Hold preadmission lasix, spironolactone due to low BP   OSA -Continue nocturnal CPAP   Diabetes mellitus -Hemoglobin A1c was 6.6 on 5/19 -Lantus + Novolog SSI  -DM coordinator consult   Hypertension -Hold preadmission lisinopril, lasix, spironolactone due to low BP   GERD  -Continue Protonix  Charcot's joint of foot, left w/ chronic wounds -Chronic issue followed at [facility] -Presumed hardware infection and has had intra-articular antibody beads placed -Prior to last admission family was using Betadine for wound care at home -She is to follow-up with her primary surgeon Dr. [name] at [facility] in [place]  Thrombocytopenia  -Was on Lovenox for DVT prophylaxis at nursing facility -Stable, monitor   Anemia, macrocytic -Hemoglobin stable and at baseline around 8 -Had hypotension in PACU post operatively, received 2u pRBC  -Hemoglobin 7 today, with marginal BP. Will transfuse 1u pRBC  today  Restless leg syndrome -Continue Mirapex  Depression -Continue  wellbutrin, cymbalta     DVT prophylaxis: SCDs Code Status: Full Family Communication: Husband at bedside  Disposition Plan: SNF    Consultants:   Orthopedic surgery  Procedures:   None  Antimicrobials:  Anti-infectives    Start     Dose/Rate Route Frequency Ordered Stop   04/09/17 2330  ceFAZolin (ANCEF) IVPB 2g/100 mL premix     2 g 200 mL/hr over 30 Minutes Intravenous Every 6 hours 04/09/17 2224 04/10/17 0633   04/09/17 0600  ceFAZolin (ANCEF) IVPB 2g/100 mL premix     2 g 200 mL/hr over 30 Minutes Intravenous On call to O.R. 04/08/17 1733 04/09/17 1633   04/08/17 2200  rifaximin (XIFAXAN) tablet 550 mg     550 mg Oral 2 times daily 04/08/17 1737         Subjective: Much more alert, no complaints of pain, chest pain, SOB, abdominal pain.   Objective: Vitals:   04/12/17 0456 04/12/17 0754 04/12/17 0937 04/12/17 1005  BP:    130/65  Pulse:  (!) 29 97   Resp:   19 20  Temp: 98.3 F (36.8 C) 98.3 F (36.8 C) 98.2 F (36.8 C) 98.6 F (37 C)  TempSrc: Axillary Axillary Axillary Axillary  SpO2:  (!) 89%    Weight:      Height:        Intake/Output Summary (Last 24 hours) at 04/12/17 1006 Last data filed at 04/12/17 0900  Gross per 24 hour  Intake          2801.67 ml  Output              960 ml  Net          1841.67 ml   Filed Weights   04/08/17 1733 04/09/17 2220  Weight: 122.3 kg (269 lb 10 oz) 126.3 kg (278 lb 7.1 oz)    Examination: General exam: Appears calm and comfortable  Respiratory system: Clear to auscultation. Respiratory effort normal. Cardiovascular system: S1 & S2 heard, RRR. No JVD, murmurs, rubs, gallops or clicks. No pedal edema. Gastrointestinal system: Abdomen is nondistended, soft and nontender. No organomegaly or masses felt. Normal bowel sounds heard. Central nervous system: Alert and oriented, fatigued, non focal exam, conversational  Extremities: No edema or cyanosis  Skin: No rashes, lesions or ulcers on visible skin,  surgical site dressing clean and dry   Data Reviewed: I have personally reviewed following labs and imaging studies  CBC:  Recent Labs Lab 04/08/17 1535 04/09/17 0332 04/10/17 0558 04/11/17 0658 04/12/17 0434  WBC 4.1 5.2 7.1 4.8 3.7*  NEUTROABS 3.0  --   --   --   --   HGB 8.5* 8.0* 8.5* 7.7* 7.0*  HCT 27.9* 27.3* 28.2* 26.0* 22.9*  MCV 100.7* 101.9* 99.3 101.6* 98.7  PLT 91* 97* 98* 89* 70*   Basic Metabolic Panel:  Recent Labs Lab 04/09/17 0332  04/09/17 2230 04/10/17 0558 04/10/17 1300 04/11/17 0658 04/12/17 0434  NA 135  --   --  136 137 143 137  K 6.0*  < > 5.8* 5.7* 5.3* 4.0 3.6  CL 103  --   --  107 108 114* 109  CO2 28  --   --  26 26 23 23   GLUCOSE 169*  --   --  240* 252* 246* 202*  BUN 17  --   --  24* 23* 22* 17  CREATININE 1.25*  --   --  1.34* 1.28* 1.20* 0.93  CALCIUM 8.0*  --   --  7.4* 7.4* 6.7* 6.8*  < > = values in this interval not displayed. GFR: Estimated Creatinine Clearance: 83.5 mL/min (by C-G formula based on SCr of 0.93 mg/dL). Liver Function Tests: No results for input(s): AST, ALT, ALKPHOS, BILITOT, PROT, ALBUMIN in the last 168 hours. No results for input(s): LIPASE, AMYLASE in the last 168 hours.  Recent Labs Lab 04/08/17 1800  AMMONIA 62*   Coagulation Profile: No results for input(s): INR, PROTIME in the last 168 hours. Cardiac Enzymes: No results for input(s): CKTOTAL, CKMB, CKMBINDEX, TROPONINI in the last 168 hours. BNP (last 3 results) No results for input(s): PROBNP in the last 8760 hours. HbA1C: No results for input(s): HGBA1C in the last 72 hours. CBG:  Recent Labs Lab 04/10/17 2229 04/11/17 0834 04/11/17 1810 04/11/17 2242 04/12/17 0814  GLUCAP 200* 231* 223* 323* 256*   Lipid Profile: No results for input(s): CHOL, HDL, LDLCALC, TRIG, CHOLHDL, LDLDIRECT in the last 72 hours. Thyroid Function Tests: No results for input(s): TSH, T4TOTAL, FREET4, T3FREE, THYROIDAB in the last 72 hours. Anemia Panel: No  results for input(s): VITAMINB12, FOLATE, FERRITIN, TIBC, IRON, RETICCTPCT in the last 72 hours. Sepsis Labs: No results for input(s): PROCALCITON, LATICACIDVEN in the last 168 hours.  Recent Results (from the past 240 hour(s))  MRSA PCR Screening     Status: None   Collection Time: 04/08/17  6:00 PM  Result Value Ref Range Status   MRSA by PCR NEGATIVE NEGATIVE Final    Comment:        The GeneXpert MRSA Assay (FDA approved for NASAL specimens only), is one component of a comprehensive MRSA colonization surveillance program. It is not intended to diagnose MRSA infection nor to guide or monitor treatment for MRSA infections.        Radiology Studies: Dg Chest Port 1 View  Result Date: 04/11/2017 CLINICAL DATA:  Fevers EXAM: PORTABLE CHEST 1 VIEW COMPARISON:  04/09/2017 FINDINGS: Cardiac shadow is again enlarged. Left jugular central line is noted in the innominate vein. The lungs are well aerated bilaterally with evidence of central vascular congestion. No focal infiltrate or significant pulmonary edema is noted. Mild left basilar atelectasis is seen. IMPRESSION: Mild vascular congestion and left basilar atelectasis. No significant edema is noted. Electronically Signed   By: Inez Catalina M.D.   On: 04/11/2017 08:06      Scheduled Meds: . buPROPion  300 mg Oral Daily  . DULoxetine  60 mg Oral Daily  . enoxaparin (LOVENOX) injection  40 mg Subcutaneous Q24H  . insulin aspart  0-5 Units Subcutaneous QHS  . insulin aspart  0-9 Units Subcutaneous TID WC  . insulin aspart  12 Units Subcutaneous TID WC  . insulin glargine  48 Units Subcutaneous QHS  . lactulose  75 g Oral TID  . magnesium oxide  400 mg Oral Daily  . omega-3 acid ethyl esters  1 g Oral Daily  . pantoprazole  40 mg Oral QHS  . pramipexole  2 mg Oral QHS  . rifaximin  550 mg Oral BID   Continuous Infusions: . sodium chloride    . sodium chloride 125 mL/hr at 04/12/17 0221  . sodium chloride    . methocarbamol  (ROBAXIN)  IV       LOS: 4 days    Time spent: 30 minutes   Dessa Phi, DO [facility] [email or web address] Password [email or web address] 04/12/2017, 10:06 AM

## 2017-04-12 NOTE — Progress Notes (Signed)
Pt is refusing all telemetry monitoring and nursing care at this time. She is still exhibiting paranoia at this time despite PRN haldol.

## 2017-04-12 NOTE — Progress Notes (Signed)
  PHARMACY - PHYSICIAN COMMUNICATION CRITICAL VALUE ALERT - BLOOD CULTURE IDENTIFICATION (BCID)  Results for orders placed or performed during the hospital encounter of 04/08/17  Blood Culture ID Panel (Reflexed) (Collected: 04/11/2017  8:34 AM)  Result Value Ref Range   Enterococcus species NOT DETECTED NOT DETECTED   Listeria monocytogenes NOT DETECTED NOT DETECTED   Staphylococcus species DETECTED (A) NOT DETECTED   Staphylococcus aureus NOT DETECTED NOT DETECTED   Methicillin resistance DETECTED (A) NOT DETECTED   Streptococcus species NOT DETECTED NOT DETECTED   Streptococcus agalactiae NOT DETECTED NOT DETECTED   Streptococcus pneumoniae NOT DETECTED NOT DETECTED   Streptococcus pyogenes NOT DETECTED NOT DETECTED   Acinetobacter baumannii NOT DETECTED NOT DETECTED   Enterobacteriaceae species NOT DETECTED NOT DETECTED   Enterobacter cloacae complex NOT DETECTED NOT DETECTED   Escherichia coli NOT DETECTED NOT DETECTED   Klebsiella oxytoca NOT DETECTED NOT DETECTED   Klebsiella pneumoniae NOT DETECTED NOT DETECTED   Proteus species NOT DETECTED NOT DETECTED   Serratia marcescens NOT DETECTED NOT DETECTED   Haemophilus influenzae NOT DETECTED NOT DETECTED   Neisseria meningitidis NOT DETECTED NOT DETECTED   Pseudomonas aeruginosa NOT DETECTED NOT DETECTED   Candida albicans NOT DETECTED NOT DETECTED   Candida glabrata NOT DETECTED NOT DETECTED   Candida krusei NOT DETECTED NOT DETECTED   Candida parapsilosis NOT DETECTED NOT DETECTED   Candida tropicalis NOT DETECTED NOT DETECTED    Name of physician (or Provider) Contacted: Dr. Maylene Roes  Changes to prescribed antibiotics required: None - Vancomycin added earlier today.   Lawson Radar 04/12/2017  1:07 PM
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
## 2017-04-12 NOTE — Progress Notes (Signed)
Message sent to Dr Maylene Roes that husband would like to speak with him before infusing of URBC.

## 2017-04-12 NOTE — Care Management Important Message (Signed)
Important Message  Patient Details  Name: Daisy Larson MRN: [id number] Date of Birth: 04-08-1951   Medicare Important Message Given:  Yes    Nathen May 04/12/2017, 9:44 AM

## 2017-04-12 NOTE — Progress Notes (Signed)
Physical Therapy Treatment Patient Details Name: Daisy Larson MRN: [id number] DOB: [birth year] Today's Date: 04/12/2017    History of Present Illness Pt adm with rt femur fx after fall at SNF. Underwent ORIF on 04/09/17. Pt with recent (3wks ago) rt femur fx with ORIF. PMH - malignant neoplasm of upper-outer quadrant of right breast in female, Charcot foot and diabetic arthropathy status post several surgical interventions, complicated by intra-articular infection status post intra-articular bead placement about 3 weeks ago, nonalcoholic cirrhosis, lt femur fx 03/18/17    PT Comments    Pt much more alert than on eval. Able to tolerate transfer to recliner with maxisky lift. Daughters present for treatment   Follow Up Recommendations  SNF     Equipment Recommendations  Other (comment) (TBA)    Recommendations for Other Services       Precautions / Restrictions Precautions Precautions: Fall Required Braces or Orthoses: Other Brace/Splint Other Brace/Splint: pt requires diabetic shoe and "special" shoe for L foot s/p antibiotic beads  (per recent chart) Restrictions Weight Bearing Restrictions: Yes RLE Weight Bearing: Non weight bearing LLE Weight Bearing: Weight bearing as tolerated (for transfers) Other Position/Activity Restrictions: Per ortho progress note    Mobility  Bed Mobility Overal bed mobility: Needs Assistance Bed Mobility: Rolling Rolling: +2 for physical assistance;Total assist         General bed mobility comments: Pt assist by reaching with UE for rail to roll. Assist required to move trunk, hips, and LES  Transfers Overall transfer level: Needs assistance               General transfer comment: Used Maxisky to transfer pt from bed to recliner  Ambulation/Gait                 Stairs            Wheelchair Mobility    Modified Rankin (Stroke Patients Only)       Balance                                             Cognition Arousal/Alertness: Awake/alert Behavior During Therapy: Flat affect Overall Cognitive Status: Impaired/Different from baseline Area of Impairment: Memory;Following commands;Safety/judgement;Problem solving                     Memory: Decreased recall of precautions;Decreased short-term memory Following Commands: Follows one step commands consistently (didn't follow commands) Safety/Judgement: Decreased awareness of safety;Decreased awareness of deficits   Problem Solving: Slow processing;Requires verbal cues;Requires tactile cues;Decreased initiation;Difficulty sequencing        Exercises      General Comments        Pertinent Vitals/Pain Pain Assessment: Faces Faces Pain Scale: Hurts whole lot Pain Location: rt hip with any movement Pain Descriptors / Indicators: Grimacing;Guarding Pain Intervention(s): Limited activity within patient's tolerance;Monitored during session    Home Living                      Prior Function            PT Goals (current goals can now be found in the care plan section) Progress towards PT goals: Progressing toward goals    Frequency    Min 3X/week      PT Plan Current plan remains appropriate    Co-evaluation  AM-PAC PT "6 Clicks" Daily Activity  Outcome Measure  Difficulty turning over in bed (including adjusting bedclothes, sheets and blankets)?: Total Difficulty moving from lying on back to sitting on the side of the bed? : Total Difficulty sitting down on and standing up from a chair with arms (e.g., wheelchair, bedside commode, etc,.)?: Total Help needed moving to and from a bed to chair (including a wheelchair)?: Total Help needed walking in hospital room?: Total Help needed climbing 3-5 steps with a railing? : Total 6 Click Score: 6    End of Session   Activity Tolerance: Patient tolerated treatment well Patient left: with call bell/phone within reach;in  chair;with chair alarm set;with family/visitor present Nurse Communication: Mobility status;Need for lift equipment (nurse assisted with transfer) PT Visit Diagnosis: Other abnormalities of gait and mobility (R26.89);Repeated falls (R29.6);Muscle weakness (generalized) (M62.81);History of falling (Z91.81);Difficulty in walking, not elsewhere classified (R26.2);Pain Pain - Right/Left: Right Pain - part of body: Hip     Time: 1645-1720 PT Time Calculation (min) (ACUTE ONLY): 35 min  Charges:  $Therapeutic Activity: 23-37 mins                    G CodesMarland Kitchen       [facility] PT [place] 04/12/2017, 5:36 PM

## 2017-04-12 NOTE — Progress Notes (Signed)
Pharmacy Antibiotic Note  Daisy Larson is a 66 y.o. female with recent IM nailing for L-femur fracture in May '18 who presented on 04/08/2017 with fall and hip pain. The patient is now s/p R-femur IM fixation on 6/11. Blood culture today are showing 2/2 GPC in clusters and pharmacy has been consulted to start Vancomycin.   Plan: 1. Vancomycin 2500 mg IV x 1 followed by 1g IV every 12 hours 2. Will continue to follow renal function, culture results, LOT, and antibiotic de-escalation plans   Height: 5\' 8"  (172.7 cm) Weight: 278 lb 7.1 oz (126.3 kg) IBW/kg (Calculated) : 63.9  Temp (24hrs), Avg:98.4 F (36.9 C), Min:98.2 F (36.8 C), Max:98.8 F (37.1 C)   Recent Labs Lab 04/08/17 1535 04/09/17 0332 04/10/17 0558 04/10/17 1300 04/11/17 0658 04/12/17 0434  WBC 4.1 5.2 7.1  --  4.8 3.7*  CREATININE 1.18* 1.25* 1.34* 1.28* 1.20* 0.93    Estimated Creatinine Clearance: 83.5 mL/min (by C-G formula based on SCr of 0.93 mg/dL).    Allergies  Allergen Reactions  . Other Rash    Blisters Per Patient - she has been told she can not have general anesthesia "unless life-threatening"  . Tape   . Oxymorphone     lethargic  . Barbiturates Dermatitis and Other (See Comments)    Drowsiness, patient states that Barbiturates & Narcotics make her extremely sleepy and drowsy 24 Apr 2013 Drowsiness  "sleeps forever"    Antimicrobials this admission: Cefazolin pre/post-op 6/11 >> 6/12 Vanc 6/14 >>  Dose adjustments this admission: n/a  Microbiology results: 6/13 BCx >> 2/2 GPC in clusters  Thank you for allowing pharmacy to be a part of this patient's care.  Alycia Rossetti, PharmD, BCPS Clinical Pharmacist Pager: [phone] Clinical phone for 04/12/2017 from 7a-3:30p: [phone] If after 3:30p, please call main pharmacy at: [phone] 04/12/2017 12:05 PM

## 2017-04-12 NOTE — Plan of Care (Signed)
Problem: Safety: Goal: Ability to remain free from injury will improve Outcome: Progressing Pt is still confused an periodically agitated. She displays paranoid symptoms after waking up from sleep and when in this state she is very impulsive and difficult to control or keep safe. Haldol 2mg  IV was given and is offering some improvement.

## 2017-04-12 NOTE — Progress Notes (Signed)
Inpatient Diabetes Program Recommendations  AACE/ADA: New Consensus Statement on Inpatient Glycemic Control (2015)  Target Ranges:  Prepandial:   less than 140 mg/dL      Peak postprandial:   less than 180 mg/dL (1-2 hours)      Critically ill patients:  140 - 180 mg/dL   Results for Daisy, Larson (MRN [id number]) as of 04/12/2017 10:12  Ref. Range 04/11/2017 08:34 04/11/2017 18:10 04/11/2017 22:42 04/12/2017 08:14  Glucose-Capillary Latest Ref Range: 65 - 99 mg/dL 231 (H) 223 (H) 323 (H) 256 (H)   Review of Glycemic Control  Diabetes history: DM 2 Outpatient Diabetes medications: Novolog 70/30 mix 100 units q AM, 70 units with dinner and 70 units at HS if CBG>200 Current orders for Inpatient glycemic control: Lantus 48 units QHS, Novolog Sensitive Correction 0-9 units tid + Novolog HS scale + Novolog 12 units tid meal coverage  Inpatient Diabetes Program Recommendations:    Consider increasing Lantus to 30 units BID (total of 60 units) starting this am. Due to variable PO intake would not increase meal coverage at this time. Watch trends.  Thanks,  Tama Headings RN, MSN, [facility] Inpatient Diabetes Coordinator Team Pager [phone] (8a-5p)

## 2017-04-13 ENCOUNTER — Inpatient Hospital Stay (HOSPITAL_COMMUNITY): Payer: Medicare Other

## 2017-04-13 DIAGNOSIS — R7881 Bacteremia: Secondary | ICD-10-CM

## 2017-04-13 DIAGNOSIS — B957 Other staphylococcus as the cause of diseases classified elsewhere: Secondary | ICD-10-CM

## 2017-04-13 DIAGNOSIS — S31000A Unspecified open wound of lower back and pelvis without penetration into retroperitoneum, initial encounter: Secondary | ICD-10-CM

## 2017-04-13 LAB — BPAM RBC
BLOOD PRODUCT EXPIRATION DATE: 201806232359
BLOOD PRODUCT EXPIRATION DATE: 201806252359
Blood Product Expiration Date: 201806232359
Blood Product Expiration Date: 201806252359
ISSUE DATE / TIME: 201806111849
ISSUE DATE / TIME: 201806140921
ISSUE DATE / TIME: 201806111849
UNIT TYPE AND RH: 6200
Unit Type and Rh: 6200
Unit Type and Rh: 6200
Unit Type and Rh: 6200

## 2017-04-13 LAB — TYPE AND SCREEN
ABO/RH(D): A POS
Antibody Screen: NEGATIVE
UNIT DIVISION: 0
UNIT DIVISION: 0
Unit division: 0
Unit division: 0

## 2017-04-13 LAB — GLUCOSE, CAPILLARY
GLUCOSE-CAPILLARY: 220 mg/dL — AB (ref 65–99)
GLUCOSE-CAPILLARY: 236 mg/dL — AB (ref 65–99)
GLUCOSE-CAPILLARY: 198 mg/dL — AB (ref 65–99)
Glucose-Capillary: 209 mg/dL — ABNORMAL HIGH (ref 65–99)

## 2017-04-13 LAB — CBC
HCT: 25.8 % — ABNORMAL LOW (ref 36.0–46.0)
HEMOGLOBIN: 8.2 g/dL — AB (ref 12.0–15.0)
MCH: 30.4 pg (ref 26.0–34.0)
MCHC: 31.8 g/dL (ref 30.0–36.0)
MCV: 95.6 fL (ref 78.0–100.0)
PLATELETS: 69 10*3/uL — AB (ref 150–400)
RBC: 2.7 MIL/uL — AB (ref 3.87–5.11)
RDW: 16.3 % — ABNORMAL HIGH (ref 11.5–15.5)
WBC: 4.5 10*3/uL (ref 4.0–10.5)

## 2017-04-13 MED ORDER — INSULIN GLARGINE 100 UNIT/ML ~~LOC~~ SOLN
30.0000 [IU] | Freq: Two times a day (BID) | SUBCUTANEOUS | Status: DC
  Administered 2017-04-13 – 2017-04-16 (×7): 30 [IU] via SUBCUTANEOUS
  Filled 2017-04-13 (×9): qty 0.3

## 2017-04-13 MED ORDER — GERHARDT'S BUTT CREAM
TOPICAL_CREAM | Freq: Two times a day (BID) | CUTANEOUS | Status: DC
  Administered 2017-04-13 – 2017-04-16 (×7): via TOPICAL
  Filled 2017-04-13 (×2): qty 1

## 2017-04-13 MED ORDER — SPIRONOLACTONE 25 MG PO TABS
75.0000 mg | ORAL_TABLET | Freq: Every day | ORAL | Status: DC
  Administered 2017-04-13 – 2017-04-14 (×2): 75 mg via ORAL
  Filled 2017-04-13: qty 3
  Filled 2017-04-13: qty 1

## 2017-04-13 MED ORDER — ASPIRIN 81 MG PO CHEW
81.0000 mg | CHEWABLE_TABLET | Freq: Every day | ORAL | Status: DC
  Administered 2017-04-13 – 2017-04-14 (×2): 81 mg via ORAL
  Filled 2017-04-13 (×2): qty 1

## 2017-04-13 MED ORDER — NYSTATIN 100000 UNIT/GM EX CREA: TOPICAL_CREAM | Freq: Two times a day (BID) | CUTANEOUS | Status: DC

## 2017-04-13 MED ORDER — GABAPENTIN 600 MG PO TABS
600.0000 mg | ORAL_TABLET | Freq: Three times a day (TID) | ORAL | Status: DC
  Administered 2017-04-13 – 2017-04-14 (×4): 600 mg via ORAL
  Filled 2017-04-13 (×4): qty 1

## 2017-04-13 MED ORDER — CITALOPRAM HYDROBROMIDE 20 MG PO TABS: 20.0000 mg | ORAL_TABLET | Freq: Every day | ORAL | Status: DC

## 2017-04-13 MED ORDER — FUROSEMIDE 40 MG PO TABS
60.0000 mg | ORAL_TABLET | Freq: Every day | ORAL | Status: DC
  Administered 2017-04-13: 12:00:00 60 mg via ORAL
  Filled 2017-04-13: qty 1

## 2017-04-13 NOTE — Progress Notes (Signed)
04/13/2017- Pt placed on CPAP for the night- tolerating well.

## 2017-04-13 NOTE — Progress Notes (Signed)
PROGRESS NOTE    [name]  WPV:[id number] DOB: 05/10/1951 DOA: 04/08/2017 PCP: [name], MD     Brief Narrative:  Daisy Larson is a 66 y.o. 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 She was at the nursing facility when she got out of bed unassisted to obtain a [place] book and fell. She is experiencing muscle spasms and right upper leg pain with external rotation and shortening of the right lower extremity. She was given 150 g of fentanyl by EMS prior to arrival. Imaging obtained in the ER reveals comminuted fracture of the right femur. Patient underwent right intramedullary fixation 6/11. Postoperatively complicated by hypotension, required 2u pRBC, then had few days of encephalopathy likely in combination of pain medications, anesthesia, and hepatic encephalopathy.   Assessment & Plan:   Principal Problem:   Closed displaced comminuted fracture of shaft of right femur ([place]) Active Problems:   Liver cirrhosis secondary to NASH (HCC)   GERD (gastroesophageal reflux disease)   Sleep apnea   Closed left comminuted femur fracture  s/p intramedullary nailing (HCC)   Charcot's joint of foot, left w/ chronic wounds   Diabetes mellitus   Hypertension   Thrombocytopenia (HCC)   Anemia, macrocytic   Dehydration with hyponatremia   Closed comminuted intertrochanteric fracture of proximal end of right femur (HCC)  Closed displaced comminuted fracture of shaft of right femur  -Presents with mechanical fall after unassisted ambulation resulting in femur fracture -Underwent right intramedullary fixation on 6/11 with Dr. [name]. Had  hypotension in PACU post operatively, received 2u pRBC  -PT OT recommending SNF   Acute encephalopathy, likely combination of hepatic encephalopathy, meds -Patient has history of postoperative encephalopathy in the past -Continue rifaximin, lactulose for goal of 3 bowel movements daily -Limit narcotics  -Resolved, pulled out IV overnight, but otherwise alert, oriented, aware   Sepsis secondary to coag neg staph bacteremia  -UA obtained without infection -CXR obtained stable -Blood cultures with 2 of 2 coag neg staph, likely due to central line induced  -Surgical site dressing clean and dry, no erythema  -Remove central line (this was ordered yesterday. Will order again today to remove, spoke with RN today)  -No further fevers  -Vanco until sensitivity results   -ID consult   CKD stage 3 -Baseline Cr 1.1-1.3 -Stable   Liver cirrhosis secondary to NASH, with hx hepatic encephalopathy  -Resume lasix and spironolactone today as BP has improved   OSA -Continue nocturnal CPAP   Diabetes mellitus -Hemoglobin A1c was 6.6 on 5/19 -Lantus + Novolog SSI. Dose adjusted today  -DM coordinator consult   Hypertension -Resume lasix and spironolactone today as BP has improved   GERD  -Continue Protonix  Charcot's joint of foot, left w/ chronic wounds -Chronic issue followed at [facility] -Presumed hardware infection and has had intra-articular antibody beads placed -Prior to last admission family was using Betadine for wound care at home -She is to follow-up with her primary surgeon Dr. [name] at [facility] in [place]  Thrombocytopenia  -Was on Lovenox for DVT prophylaxis at nursing facility -Stable, monitor   Anemia, macrocytic -Hemoglobin stable and at baseline around 8 -Had hypotension in PACU post operatively, received 2u pRBC  -  Transfused 1u pRBC on 6/14 -Stable   Restless leg syndrome -Continue Mirapex  Depression -Continue  wellbutrin, cymbalta   Acute urinary retention -Straight cath prn    DVT prophylaxis: SCDs, lovenox  Code Status: Full Family Communication: Husband, daughter at bedside  Disposition Plan: SNF    Consultants:   Orthopedic surgery  ID  Procedures:   None  Antimicrobials:  Anti-infectives    Start     Dose/Rate Route Frequency Ordered Stop   04/13/17 0500  vancomycin (VANCOCIN) IVPB 1000 mg/200 mL premix     1,000 mg 200 mL/hr over 60 Minutes Intravenous Every 12 hours 04/12/17 1208     04/12/17 1300  vancomycin (VANCOCIN) 2,500 mg in sodium chloride 0.9 % 500 mL IVPB     2,500 mg 250 mL/hr over 120 Minutes Intravenous  Once 04/12/17 1208 04/12/17 1450   04/09/17 2330  ceFAZolin (ANCEF) IVPB 2g/100 mL premix     2 g 200 mL/hr over 30 Minutes Intravenous Every 6 hours 04/09/17 2224 04/10/17 0633   04/09/17 0600  ceFAZolin (ANCEF) IVPB 2g/100 mL premix     2 g 200 mL/hr over 30 Minutes Intravenous On call to O.R. 04/08/17 1733 04/09/17 1633   04/08/17 2200  rifaximin (XIFAXAN) tablet 550 mg     550 mg Oral 2 times daily 04/08/17 1737         Subjective:  Much more alert, no complaints of pain, chest pain, SOB, abdominal pain, no nausea, no fevers or chills.   Objective: Vitals:   04/12/17 2317 04/12/17 2338 04/13/17 0413 04/13/17 0814  BP:  (!) 142/57 139/74 120/63  Pulse: 84 82 94   Resp: 18 19 (!) 22   Temp:  97 F (36.1 C) 98.1 F (36.7 C) (!) 96.8 F (36 C)  TempSrc:  Axillary Oral Axillary  SpO2: 95% 97% 98%   Weight:      Height:        Intake/Output Summary (Last 24 hours) at 04/13/17 1203 Last data filed at 04/13/17 0944  Gross per 24 hour  Intake             1210 ml  Output             1903 ml  Net             -693 ml   Filed Weights   04/08/17 1733 04/09/17 2220  Weight: 122.3 kg (269 lb 10 oz) 126.3 kg (278 lb 7.1 oz)    Examination: General exam: Appears calm and comfortable  Respiratory system: Clear to auscultation. Respiratory  effort normal. Cardiovascular system: S1 & S2 heard, RRR. No JVD, murmurs, rubs, gallops or clicks. No pedal edema. Gastrointestinal system: Abdomen is nondistended, soft and nontender. No organomegaly or masses felt. Normal bowel sounds heard. Central nervous system: Alert and oriented, non focal exam, conversational  Extremities: No edema or cyanosis  Skin: No rashes, lesions or ulcers on visible skin, surgical site dressing clean and dry   Data Reviewed: I have personally reviewed following labs and imaging studies  CBC:  Recent Labs Lab 04/08/17 1535 04/09/17 0332 04/10/17 0558 04/11/17 0658 04/12/17 0434 04/13/17 0424  WBC 4.1 5.2 7.1 4.8 3.7* 4.5  NEUTROABS 3.0  --   --   --   --   --   HGB 8.5* 8.0* 8.5* 7.7* 7.0* 8.2*  HCT 27.9* 27.3* 28.2* 26.0* 22.9* 25.8*  MCV 100.7* 101.9* 99.3 101.6* 98.7 95.6  PLT 91* 97* 98* 89* 70* 69*  Basic Metabolic Panel:  Recent Labs Lab 04/09/17 0332  04/09/17 2230 04/10/17 0558 04/10/17 1300 04/11/17 0658 04/12/17 0434  NA 135  --   --  136 137 143 137  K 6.0*  < > 5.8* 5.7* 5.3* 4.0 3.6  CL 103  --   --  107 108 114* 109  CO2 28  --   --  26 26 23 23   GLUCOSE 169*  --   --  240* 252* 246* 202*  BUN 17  --   --  24* 23* 22* 17  CREATININE 1.25*  --   --  1.34* 1.28* 1.20* 0.93  CALCIUM 8.0*  --   --  7.4* 7.4* 6.7* 6.8*  < > = values in this interval not displayed. GFR: Estimated Creatinine Clearance: 83.5 mL/min (by C-G formula based on SCr of 0.93 mg/dL). Liver Function Tests: No results for input(s): AST, ALT, ALKPHOS, BILITOT, PROT, ALBUMIN in the last 168 hours. No results for input(s): LIPASE, AMYLASE in the last 168 hours.  Recent Labs Lab 04/08/17 1800  AMMONIA 62*   Coagulation Profile: No results for input(s): INR, PROTIME in the last 168 hours. Cardiac Enzymes: No results for input(s): CKTOTAL, CKMB, CKMBINDEX, TROPONINI in the last 168 hours. BNP (last 3 results) No results for input(s): PROBNP in the  last 8760 hours. HbA1C: No results for input(s): HGBA1C in the last 72 hours. CBG:  Recent Labs Lab 04/11/17 2242 04/12/17 0814 04/12/17 1209 04/12/17 1626 04/12/17 2055  GLUCAP 323* 256* 307* 246* 272*   Lipid Profile: No results for input(s): CHOL, HDL, LDLCALC, TRIG, CHOLHDL, LDLDIRECT in the last 72 hours. Thyroid Function Tests: No results for input(s): TSH, T4TOTAL, FREET4, T3FREE, THYROIDAB in the last 72 hours. Anemia Panel: No results for input(s): VITAMINB12, FOLATE, FERRITIN, TIBC, IRON, RETICCTPCT in the last 72 hours. Sepsis Labs: No results for input(s): PROCALCITON, LATICACIDVEN in the last 168 hours.  Recent Results (from the past 240 hour(s))  MRSA PCR Screening     Status: None   Collection Time: 04/08/17  6:00 PM  Result Value Ref Range Status   MRSA by PCR NEGATIVE NEGATIVE Final    Comment:        The GeneXpert MRSA Assay (FDA approved for NASAL specimens only), is one component of a comprehensive MRSA colonization surveillance program. It is not intended to diagnose MRSA infection nor to guide or monitor treatment for MRSA infections.   Culture, blood (routine x 2)     Status: None (Preliminary result)   Collection Time: 04/11/17  8:34 AM  Result Value Ref Range Status   Specimen Description BLOOD LEFT ARM  Final   Special Requests   Final    BOTTLES DRAWN AEROBIC ONLY Blood Culture adequate volume   Culture  Setup Time   Final    GRAM POSITIVE COCCI IN CLUSTERS IN PEDIATRIC BOTTLE CRITICAL RESULT CALLED TO, READ BACK BY AND VERIFIED WITH: E MARTIN 04/12/17 @ 50 M VESTAL    Culture GRAM POSITIVE COCCI  Final   Report Status PENDING  Incomplete  Blood Culture ID Panel (Reflexed)     Status: Abnormal   Collection Time: 04/11/17  8:34 AM  Result Value Ref Range Status   Enterococcus species NOT DETECTED NOT DETECTED Final   Listeria monocytogenes NOT DETECTED NOT DETECTED Final   Staphylococcus species DETECTED (A) NOT DETECTED Final     Comment: Methicillin (oxacillin) resistant coagulase negative staphylococcus. Possible blood culture contaminant (unless isolated from more than one  blood culture draw or clinical case suggests pathogenicity). No antibiotic treatment is indicated for blood  culture contaminants. CRITICAL RESULT CALLED TO, READ BACK BY AND VERIFIED WITH: E MARTIN 04/12/17 @ 23 M VESTAL    Staphylococcus aureus NOT DETECTED NOT DETECTED Final   Methicillin resistance DETECTED (A) NOT DETECTED Final    Comment: CRITICAL RESULT CALLED TO, READ BACK BY AND VERIFIED WITH: E MARTIN 04/12/17 @ 7 M VESTAL    Streptococcus species NOT DETECTED NOT DETECTED Final   Streptococcus agalactiae NOT DETECTED NOT DETECTED Final   Streptococcus pneumoniae NOT DETECTED NOT DETECTED Final   Streptococcus pyogenes NOT DETECTED NOT DETECTED Final   Acinetobacter baumannii NOT DETECTED NOT DETECTED Final   Enterobacteriaceae species NOT DETECTED NOT DETECTED Final   Enterobacter cloacae complex NOT DETECTED NOT DETECTED Final   Escherichia coli NOT DETECTED NOT DETECTED Final   Klebsiella oxytoca NOT DETECTED NOT DETECTED Final   Klebsiella pneumoniae NOT DETECTED NOT DETECTED Final   Proteus species NOT DETECTED NOT DETECTED Final   Serratia marcescens NOT DETECTED NOT DETECTED Final   Haemophilus influenzae NOT DETECTED NOT DETECTED Final   Neisseria meningitidis NOT DETECTED NOT DETECTED Final   Pseudomonas aeruginosa NOT DETECTED NOT DETECTED Final   Candida albicans NOT DETECTED NOT DETECTED Final   Candida glabrata NOT DETECTED NOT DETECTED Final   Candida krusei NOT DETECTED NOT DETECTED Final   Candida parapsilosis NOT DETECTED NOT DETECTED Final   Candida tropicalis NOT DETECTED NOT DETECTED Final  Culture, blood (routine x 2)     Status: None (Preliminary result)   Collection Time: 04/11/17  8:36 AM  Result Value Ref Range Status   Specimen Description BLOOD LEFT ANTECUBITAL  Final   Special Requests   Final      BOTTLES DRAWN AEROBIC ONLY Blood Culture adequate volume   Culture  Setup Time   Final    GRAM POSITIVE COCCI IN CLUSTERS IN PEDIATRIC BOTTLE CRITICAL RESULT CALLED TO, READ BACK BY AND VERIFIED WITH: E MARTIN 04/12/17 @ 43 M VESTAL    Culture GRAM POSITIVE COCCI  Final   Report Status PENDING  Incomplete       Radiology Studies: No results found.    Scheduled Meds: . aspirin  81 mg Oral Daily  . buPROPion  300 mg Oral Daily  . DULoxetine  60 mg Oral Daily  . enoxaparin (LOVENOX) injection  40 mg Subcutaneous Q24H  . furosemide  60 mg Oral Daily  . gabapentin  600 mg Oral TID  . insulin aspart  0-5 Units Subcutaneous QHS  . insulin aspart  0-9 Units Subcutaneous TID WC  . insulin aspart  12 Units Subcutaneous TID WC  . insulin glargine  30 Units Subcutaneous BID  . lactulose  75 g Oral TID  . magnesium oxide  400 mg Oral Daily  . omega-3 acid ethyl esters  1 g Oral Daily  . pantoprazole  40 mg Oral QHS  . pramipexole  2 mg Oral QHS  . rifaximin  550 mg Oral BID  . spironolactone  75 mg Oral Daily   Continuous Infusions: . methocarbamol (ROBAXIN)  IV    . vancomycin Stopped (04/13/17 0535)     LOS: 5 days    Time spent: 30 minutes   Dessa Phi, DO [facility] [email or web address] Password [facility] 04/13/2017, 12:03 PM

## 2017-04-13 NOTE — Progress Notes (Signed)
04/13/2017 Patient have not voided last night bladder scan was done at 0909 she had 795 in bladder. Dr Maylene Roes was mad aware. A one time order for In and out cath. In and out cath was performed at 0944 patient had 1000 cc of amber urine. [facility] RN.

## 2017-04-13 NOTE — Clinical Social Work Note (Signed)
CSW spoke with MD. Pt will not d/c today. Pt will d/c probably sometime over the weekend. Facility wants MD to be sure to complete summary before 11 over the weekend on day of d/c. MD notified  Loletha Grayer, [place]

## 2017-04-13 NOTE — Progress Notes (Signed)
04/13/2017 Patient was bladder scan at 5075 she had 259 in bladder. Patient receive a In and out cath at 1635 she had 900cc out of amber urine. Rico Sheehan RN

## 2017-04-13 NOTE — Consult Note (Signed)
[facility] for Infectious Disease  Total days of antibiotics 2        Day Vancomycin          Admission Date: 04/08/2017      Reason for Consult: CoNS bacteremia     Referring Physician: Maylene Roes   Principal Problem:   Closed displaced comminuted fracture of shaft of right femur [facility]) Active Problems:   Liver cirrhosis secondary to NASH (Daisy Larson)   GERD (gastroesophageal reflux disease)   Sleep apnea   Closed left comminuted femur fracture  s/p intramedullary nailing ([place])   Charcot's joint of foot, left w/ chronic wounds   Diabetes mellitus   Hypertension   Thrombocytopenia (HCC)   Anemia, macrocytic   Dehydration with hyponatremia   Closed comminuted intertrochanteric fracture of proximal end of right femur ([place])    HPI: Daisy Larson is a 66 y.o. female admitted to the hospital on 04/08/2017 after sustaining a fall at [facility] facility with a right comminuted left intertrochanteric femur fracture now s/p IM nail (6/20, Dr. [name]). Received pre-op Ancef x 1. No complications during surgery aside from hypotension post-op requiring 2u PRBCs. Post op struggled with encephalopathy as well r/t her cirrhosis.   Recent Surgical History: She underwent left Charcot reconstruction with placement of external fixator by Dr. [name] in 07/2016. Ex fix was removed 10/2016. Office visit on 2/5 revealed exposed medial screw, erythema and drainage of which time she was admitted to the hospital where the exposed screw was removed at bedside. Cultures were obtained at the office and were "faintly + with CoNS"; she was started on empiric Keflex at this time. She was hospitalized in 12/2016 for AMS and AKI where she received short course CRRT, lactulose and rifaximin. Office visit on 01/25/17 where she was again placed on Bactrim x 1mdue to persistent drainage at old pin site of which was opened in the office and cultured which was +MSSA. 02/02/17 - debridement of left foot with continued sinus tract  drainage; bone cx obtained and yielded no growth (patient at this point has essentially been on chronic antibiotics); Stimulan beads impregnated with Vancomycin powder were inserted into the lateral/medial calcaneus.   03/16/17 she was admitted after a fall sustaining comminuted left intertrochanteric femur fracture now s/p L IM nail (03/18/17). She was discharged on 5/23 back to [facility] on no antibiotics. She does not recall having problems with fevers/chills while there recently, however does endorse some dizziness and low blood pressures (although her husband tells me that her normal is 90s/50s). Reports that the left foot is improved and they feel like the antibiotic beads took care of the infection previously; he believes that there are 2 remaining screws in the foot.   Since admission she developed one post op fever 6/12 of 100.4 deg. WBC have been normal. BCx on 6/13 + 2/2 CoNS (sensi pending). Blood pressure improved off her diuretics and HTN medications - these have been resumed now. She has been essentially unable to ambulate x 740m  Past Medical History:  Diagnosis Date  . Allergy    tape  . Angina    COSTOCONDRITIS    . Anxiety   . Bell's palsy 2014   twice  . Breast cancer ([place]    right breast lumpectomy/ER/PR positibe,her-2 neg.  . Breast cancer ([facility]  . Bundle branch block   . Cirrhosis ([place]  . Colon polyps 12/02/2012   TUBULAR ADENOMAS (X2) and Hyperplastic (X1)  .  Complication of anesthesia    WITH SECON HERNIA REPAIR 2012 [facility]  DIFFICULTY O2 SAT DROPPING ADMIT TO HOSPITAL   . Depression   . Diabetes mellitus   . Diverticulosis   . External hemorrhoids   . Gastritis   . GERD (gastroesophageal reflux disease)    ULCERS  . Goiter   . Headache(784.0)    MIGRAINES  . Heart murmur   . Hepatitis     Did Not have hepatitis but needed Hep A and Hep B injections2012 SPOTS ON LIVER  DR Alonza Bogus  [phone]  . Hernia   . History of stomach ulcers   .  Hypertension   . Hyperthyroidism    NODULES ON THYROID  (DR BALEN 37  12-609)  . Iron deficiency anemia 10/16/2012  . Multiple thyroid nodules   . Neuromuscular disorder ([place])    PERIPH NEUROPATHY   . Neuropathy of foot   . Restless legs   . S/P radiation therapy 02/19/12 - 04/01/12   Right Breast: 5000 cgy/25 Fractions with Boost/ 1000 cGy/5 Fractions  . Short of breath on exertion   . Shortness of breath    WITH EXERTION   . Sleep apnea     Allergies:  Allergies  Allergen Reactions  . Other Rash    Blisters Per Patient - she has been told she can not have general anesthesia "unless life-threatening"  . Tape   . Oxymorphone     lethargic  . Barbiturates Dermatitis and Other (See Comments)    Drowsiness, patient states that Barbiturates & Narcotics make her extremely sleepy and drowsy 24 Apr 2013 Drowsiness  "sleeps forever"    Current antibiotics: Vancomycin 6/14 >>    MEDICATIONS: . aspirin  81 mg Oral Daily  . buPROPion  300 mg Oral Daily  . DULoxetine  60 mg Oral Daily  . enoxaparin (LOVENOX) injection  40 mg Subcutaneous Q24H  . furosemide  60 mg Oral Daily  . gabapentin  600 mg Oral TID  . insulin aspart  0-5 Units Subcutaneous QHS  . insulin aspart  0-9 Units Subcutaneous TID WC  . insulin aspart  12 Units Subcutaneous TID WC  . insulin glargine  30 Units Subcutaneous BID  . lactulose  75 g Oral TID  . magnesium oxide  400 mg Oral Daily  . omega-3 acid ethyl esters  1 g Oral Daily  . pantoprazole  40 mg Oral QHS  . pramipexole  2 mg Oral QHS  . rifaximin  550 mg Oral BID  . spironolactone  75 mg Oral Daily    Social History  Substance Use Topics  . Smoking status: Never Smoker  . Smokeless tobacco: Never Used  . Alcohol use No    Family History  Problem Relation Age of Onset  . Diabetes Mother   . Hypertension Mother   . Heart disease Father   . Hypertension Father   . Hypertension Other   . Heart disease Other   . Lung cancer Maternal  Grandfather   . Colon cancer Paternal Grandfather   . Heart disease Maternal Uncle   . Diabetes Sister   . Heart disease Sister   . Thyroid disease Sister   . Diabetes Daughter     Review of Systems - Negative except for what is in HPI  OBJECTIVE: Temp:  [96.8 F (36 C)-98.6 F (37 C)] 96.8 F (36 C) (06/15 0814) Pulse Rate:  [82-96] 94 (06/15 0413) Resp:  [18-22] 22 (06/15 0413) BP: (120-142)/(57-74) 120/63 (  06/15 0814) SpO2:  [95 %-98 %] 98 % (06/15 0413)   General appearance: alert, cooperative and no distress Neck: CVC in place L neck. Site c/d and unremarkable. No drainage.  Resp: clear to auscultation bilaterally Cardio: regular rate and rhythm, S1, S2 normal, no murmur, click, rub or gallop GI: soft, non-tender; bowel sounds normal; no masses,  no organomegaly Pulses: 2+ and symmetric Skin: rash to sacrum - open ulcered arease with dark red/purple base to sacrum / buttocks to both sides  Neurologic: Alert and oriented X 3, normal strength and tone.  Incision/Wound: sacrum as above; Left foot - few scattered areas to lateral and ventral surface that are scabbed and healed over. Medial aspect of foot/ankle with serous drainage and open area with some surrounding edema. No erythema, purulence or warmth.   LABS: Results for orders placed or performed during the hospital encounter of 04/08/17 (from the past 48 hour(s))  Glucose, capillary     Status: Abnormal   Collection Time: 04/11/17  6:10 PM  Result Value Ref Range   Glucose-Capillary 223 (H) 65 - 99 mg/dL  Glucose, capillary     Status: Abnormal   Collection Time: 04/11/17 10:42 PM  Result Value Ref Range   Glucose-Capillary 323 (H) 65 - 99 mg/dL   Comment 1 Notify RN    Comment 2 Document in Chart   CBC     Status: Abnormal   Collection Time: 04/12/17  4:34 AM  Result Value Ref Range   WBC 3.7 (L) 4.0 - 10.5 K/uL   RBC 2.32 (L) 3.87 - 5.11 MIL/uL   Hemoglobin 7.0 (L) 12.0 - 15.0 g/dL   HCT 22.9 (L) 36.0 - 46.0  %   MCV 98.7 78.0 - 100.0 fL   MCH 30.2 26.0 - 34.0 pg   MCHC 30.6 30.0 - 36.0 g/dL   RDW 17.0 (H) 11.5 - 15.5 %   Platelets 70 (L) 150 - 400 K/uL    Comment: CONSISTENT WITH PREVIOUS RESULT  Basic metabolic panel     Status: Abnormal   Collection Time: 04/12/17  4:34 AM  Result Value Ref Range   Sodium 137 135 - 145 mmol/L   Potassium 3.6 3.5 - 5.1 mmol/L   Chloride 109 101 - 111 mmol/L   CO2 23 22 - 32 mmol/L   Glucose, Bld 202 (H) 65 - 99 mg/dL   BUN 17 6 - 20 mg/dL   Creatinine, Ser 0.93 0.44 - 1.00 mg/dL   Calcium 6.8 (L) 8.9 - 10.3 mg/dL   GFR calc non Af Amer >60 >60 mL/min   GFR calc Af Amer >60 >60 mL/min    Comment: (NOTE) The eGFR has been calculated using the CKD EPI equation. This calculation has not been validated in all clinical situations. eGFR's persistently <60 mL/min signify possible Chronic Kidney Disease.    Anion gap 5 5 - 15  Prepare RBC     Status: None   Collection Time: 04/12/17  7:12 AM  Result Value Ref Range   Order Confirmation      ORDER PROCESSED BY BLOOD BANK BB SAMPLE OR UNITS ALREADY AVAILABLE  Glucose, capillary     Status: Abnormal   Collection Time: 04/12/17  8:14 AM  Result Value Ref Range   Glucose-Capillary 256 (H) 65 - 99 mg/dL  Glucose, capillary     Status: Abnormal   Collection Time: 04/12/17 12:09 PM  Result Value Ref Range   Glucose-Capillary 307 (H) 65 - 99 mg/dL  Glucose,  capillary     Status: Abnormal   Collection Time: 04/12/17  4:26 PM  Result Value Ref Range   Glucose-Capillary 246 (H) 65 - 99 mg/dL  Glucose, capillary     Status: Abnormal   Collection Time: 04/12/17  8:55 PM  Result Value Ref Range   Glucose-Capillary 272 (H) 65 - 99 mg/dL  CBC     Status: Abnormal   Collection Time: 04/13/17  4:24 AM  Result Value Ref Range   WBC 4.5 4.0 - 10.5 K/uL   RBC 2.70 (L) 3.87 - 5.11 MIL/uL   Hemoglobin 8.2 (L) 12.0 - 15.0 g/dL   HCT 25.8 (L) 36.0 - 46.0 %   MCV 95.6 78.0 - 100.0 fL   MCH 30.4 26.0 - 34.0 pg    MCHC 31.8 30.0 - 36.0 g/dL   RDW 16.3 (H) 11.5 - 15.5 %   Platelets 69 (L) 150 - 400 K/uL    Comment: CONSISTENT WITH PREVIOUS RESULT    MICRO: BCx 6/13 >> CoNS 2/2  IMAGING: No results found.   Assessment/Plan:  66 yo female with multiple chronic medical problems including diabetes and cirrhosis with coagulase negative staph bacteremia.   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  - Central line placed after procedure on 6/11 for post op hypotension by anesthesia --> could potentially be source although she developed fever very quickly after insertion < 24 h. Agree with removal if she no longer needs line as she is bacteremic - Concern over left foot being source - still with draining wound and surrounding edema. No overt signs of infection however XRay on 5/18 with concern for loosening hardware vs infection then. Will order FU XRays today - Repeat BCx in the AM (after CVC removed)  S/P Repair of Right and Previously Left Femur Fx: - s/p intermedullary rod / screw repair on 6/11  - Some drainage to the right hip dressing but looks clean. No drainage or erythema. Has some hip/abdominal pitting edema present on both sides of hips. Not painful to touch.  - Left hip incision well healed without tenderness, drainage, fluctuance or induration to incision.   Charcot Foot, left: - Followed at [facility] Dr. [name].  - Has essentially been on chronic antibiotics since Feb 2018. Multiple rounds of Bactrim with last Rx'd on 5/11(10d) - XRay on 5/18 - severe arthropathy in the dorsal midfoot, and attempted fusion of the flattened calcaneus with the distal tibia and fibula with lucency along the rib lag screws suggesting loosening or infection - As above will  repeat xrays   Anemia: - Hgb going into surgery 8.5 > required 3 units post op with last unit 6/14 - Recent history of rectal bleeding and sigmoidoscopy on 02/14/17  Sacral Rash / Wound: - does not appear to be moisture associated damage to [facility] team and concern raised for shingles. - Reports the burning pain / rash started 2 d prior to hospitalization. Previously putting A&D baby ointment on it with some improvement. She has frequent loose stools with lactulose and uses the bedpan for urination. Unusual presentation as it is on both sides.  Duke Salvia Balm to see if it this helps as she reported some improvement with baby ointment outpatient - careful attention to hygiene post-BMs and keeping  area dry as best as able.   Would keep her in hospital until we have more information regarding source of bacteremia. I understand they are anxious to get back to [facility] so that she does not lose a bed.   Janene Madeira, MSN, NP-C [facility] for Infectious [place] Cell: [phone] Pager: [phone]  04/13/2017 2:49 PM

## 2017-04-13 NOTE — Progress Notes (Signed)
   Subjective:  Patient reports pain as mild to moderate.   Objective:   VITALS:   Vitals:   04/12/17 2317 04/12/17 2338 04/13/17 0413 04/13/17 0814  BP:  (!) 142/57 139/74 120/63  Pulse: 84 82 94   Resp: 18 19 (!) 22   Temp:  97 F (36.1 C) 98.1 F (36.7 C) (!) 96.8 F (36 C)  TempSrc:  Axillary Oral Axillary  SpO2: 95% 97% 98%   Weight:      Height:        NAD, wearing CPAP for sleep Sensation intact distally Intact pulses distally Dorsiflexion/Plantar flexion intact Incision: dressing C/D/I Compartment soft   Lab Results  Component Value Date   WBC 4.5 04/13/2017   HGB 8.2 (L) 04/13/2017   HCT 25.8 (L) 04/13/2017   MCV 95.6 04/13/2017   PLT 69 (L) 04/13/2017   BMET    Component Value Date/Time   NA 137 04/12/2017 0434   NA 138 03/01/2017 1014   K 3.6 04/12/2017 0434   K 5.2 (H) 03/01/2017 1014   CL 109 04/12/2017 0434   CL 81 (L) 02/25/2013 1300   CO2 23 04/12/2017 0434   CO2 24 03/01/2017 1014   GLUCOSE 202 (H) 04/12/2017 0434   GLUCOSE 200 (H) 03/01/2017 1014   GLUCOSE 552 Repeated and Verified (HH) 02/25/2013 1300   BUN 17 04/12/2017 0434   BUN 22.4 03/01/2017 1014   CREATININE 0.93 04/12/2017 0434   CREATININE 1.3 (H) 03/01/2017 1014   CALCIUM 6.8 (L) 04/12/2017 0434   CALCIUM 9.3 03/01/2017 1014   GFRNONAA >60 04/12/2017 0434   GFRAA >60 04/12/2017 0434     Assessment/Plan: 4 Days Post-Op   Principal Problem:   Closed displaced comminuted fracture of shaft of right femur (HCC) Active Problems:   Liver cirrhosis secondary to NASH (HCC)   GERD (gastroesophageal reflux disease)   Sleep apnea   Closed left comminuted femur fracture  s/p intramedullary nailing (HCC)   Charcot's joint of foot, left w/ chronic wounds   Diabetes mellitus   Hypertension   Thrombocytopenia (HCC)   Anemia, macrocytic   Dehydration with hyponatremia   Closed comminuted intertrochanteric fracture of proximal end of right femur (HCC)   TDWB RLE, WBAT LLE  for xfers DVT ppx: lovenox x 30 days, SCDs, TEDs PO pain control PT/OT Dispo: will need SNF   Reshma Hoey, Horald Pollen 04/13/2017, 12:16 PM   Rod Can, MD Cell [phone]

## 2017-04-14 DIAGNOSIS — L8990 Pressure ulcer of unspecified site, unspecified stage: Secondary | ICD-10-CM | POA: Insufficient documentation

## 2017-04-14 LAB — CBC
HCT: 28.8 % — ABNORMAL LOW (ref 36.0–46.0)
HEMOGLOBIN: 9.1 g/dL — AB (ref 12.0–15.0)
MCH: 31.1 pg (ref 26.0–34.0)
MCHC: 31.6 g/dL (ref 30.0–36.0)
MCV: 98.3 fL (ref 78.0–100.0)
PLATELETS: 84 10*3/uL — AB (ref 150–400)
RBC: 2.93 MIL/uL — ABNORMAL LOW (ref 3.87–5.11)
RDW: 17 % — AB (ref 11.5–15.5)
WBC: 4.1 10*3/uL (ref 4.0–10.5)

## 2017-04-14 LAB — BASIC METABOLIC PANEL
ANION GAP: 7 (ref 5–15)
BUN: 16 mg/dL (ref 6–20)
CALCIUM: 8.4 mg/dL — AB (ref 8.9–10.3)
CO2: 23 mmol/L (ref 22–32)
CREATININE: 1.23 mg/dL — AB (ref 0.44–1.00)
Chloride: 107 mmol/L (ref 101–111)
GFR calc Af Amer: 52 mL/min — ABNORMAL LOW (ref >60)
GFR, EST NON AFRICAN AMERICAN: 45 mL/min — AB (ref >60)
GLUCOSE: 250 mg/dL — AB (ref 65–99)
Potassium: 4.2 mmol/L (ref 3.5–5.1)
Sodium: 137 mmol/L (ref 135–145)

## 2017-04-14 LAB — CULTURE, BLOOD (ROUTINE X 2)
SPECIAL REQUESTS: ADEQUATE
Special Requests: ADEQUATE

## 2017-04-14 LAB — GLUCOSE, CAPILLARY
GLUCOSE-CAPILLARY: 241 mg/dL — AB (ref 65–99)
GLUCOSE-CAPILLARY: 215 mg/dL — AB (ref 65–99)
Glucose-Capillary: 164 mg/dL — ABNORMAL HIGH (ref 65–99)
Glucose-Capillary: 225 mg/dL — ABNORMAL HIGH (ref 65–99)

## 2017-04-14 MED ORDER — SPIRONOLACTONE 25 MG PO TABS
75.0000 mg | ORAL_TABLET | Freq: Every day | ORAL | Status: DC
  Administered 2017-04-15 – 2017-04-16 (×2): 75 mg via ORAL
  Filled 2017-04-14 (×2): qty 3

## 2017-04-14 MED ORDER — VANCOMYCIN HCL IN DEXTROSE 1-5 GM/200ML-% IV SOLN
1000.0000 mg | Freq: Two times a day (BID) | INTRAVENOUS | Status: DC
  Administered 2017-04-14 – 2017-04-15 (×2): 1000 mg via INTRAVENOUS
  Filled 2017-04-14 (×3): qty 200

## 2017-04-14 MED ORDER — GABAPENTIN 600 MG PO TABS
600.0000 mg | ORAL_TABLET | Freq: Three times a day (TID) | ORAL | Status: DC
  Administered 2017-04-15 – 2017-04-16 (×4): 600 mg via ORAL
  Filled 2017-04-14 (×4): qty 1

## 2017-04-14 MED ORDER — METHOCARBAMOL 1000 MG/10ML IJ SOLN
500.0000 mg | Freq: Four times a day (QID) | INTRAVENOUS | Status: DC | PRN
  Filled 2017-04-14: qty 5

## 2017-04-14 MED ORDER — ASPIRIN 81 MG PO CHEW
81.0000 mg | CHEWABLE_TABLET | Freq: Every day | ORAL | Status: DC
  Administered 2017-04-15 – 2017-04-16 (×2): 81 mg via ORAL
  Filled 2017-04-14 (×2): qty 1

## 2017-04-14 MED ORDER — INSULIN ASPART 100 UNIT/ML ~~LOC~~ SOLN
12.0000 [IU] | Freq: Three times a day (TID) | SUBCUTANEOUS | Status: DC
  Administered 2017-04-14 – 2017-04-16 (×6): 12 [IU] via SUBCUTANEOUS

## 2017-04-14 MED ORDER — FUROSEMIDE 40 MG PO TABS
60.0000 mg | ORAL_TABLET | Freq: Every day | ORAL | Status: DC
  Administered 2017-04-15 – 2017-04-16 (×2): 60 mg via ORAL
  Filled 2017-04-14 (×2): qty 1

## 2017-04-14 MED ORDER — FUROSEMIDE 10 MG/ML IJ SOLN
60.0000 mg | Freq: Once | INTRAMUSCULAR | Status: AC
  Administered 2017-04-14: 60 mg via INTRAVENOUS
  Filled 2017-04-14: qty 6

## 2017-04-14 MED ORDER — ONDANSETRON HCL 4 MG PO TABS: 4.0000 mg | ORAL_TABLET | Freq: Four times a day (QID) | ORAL | Status: DC | PRN

## 2017-04-14 MED ORDER — INSULIN ASPART 100 UNIT/ML ~~LOC~~ SOLN
0.0000 [IU] | Freq: Every day | SUBCUTANEOUS | Status: DC
  Administered 2017-04-14: 2 [IU] via SUBCUTANEOUS

## 2017-04-14 MED ORDER — LACTULOSE 10 GM/15ML PO SOLN
30.0000 g | Freq: Three times a day (TID) | ORAL | Status: DC
  Administered 2017-04-15 – 2017-04-16 (×4): 30 g via ORAL
  Filled 2017-04-14 (×6): qty 45

## 2017-04-14 MED ORDER — PANTOPRAZOLE SODIUM 40 MG PO TBEC
40.0000 mg | DELAYED_RELEASE_TABLET | Freq: Every day | ORAL | Status: DC
  Administered 2017-04-15: 40 mg via ORAL
  Filled 2017-04-14: qty 1

## 2017-04-14 MED ORDER — DULOXETINE HCL 60 MG PO CPEP
60.0000 mg | ORAL_CAPSULE | Freq: Every day | ORAL | Status: DC
  Administered 2017-04-15 – 2017-04-16 (×2): 60 mg via ORAL
  Filled 2017-04-14 (×2): qty 1

## 2017-04-14 MED ORDER — BUPROPION HCL ER (XL) 150 MG PO TB24
300.0000 mg | ORAL_TABLET | Freq: Every day | ORAL | Status: DC
  Administered 2017-04-15 – 2017-04-16 (×2): 300 mg via ORAL
  Filled 2017-04-14 (×2): qty 2

## 2017-04-14 MED ORDER — RIFAXIMIN 550 MG PO TABS
550.0000 mg | ORAL_TABLET | Freq: Two times a day (BID) | ORAL | Status: DC
  Administered 2017-04-14 – 2017-04-16 (×4): 550 mg via ORAL
  Filled 2017-04-14 (×3): qty 1

## 2017-04-14 MED ORDER — HYDROCODONE-ACETAMINOPHEN 5-325 MG PO TABS: 1.0000 | ORAL_TABLET | Freq: Four times a day (QID) | ORAL | Status: DC | PRN

## 2017-04-14 MED ORDER — MENTHOL 3 MG MT LOZG: 1.0000 | LOZENGE | OROMUCOSAL | Status: DC | PRN

## 2017-04-14 MED ORDER — METHOCARBAMOL 500 MG PO TABS: 500.0000 mg | ORAL_TABLET | Freq: Four times a day (QID) | ORAL | Status: DC | PRN

## 2017-04-14 MED ORDER — METOCLOPRAMIDE HCL 5 MG/ML IJ SOLN: 5.0000 mg | Freq: Three times a day (TID) | INTRAMUSCULAR | Status: DC | PRN

## 2017-04-14 MED ORDER — MAGNESIUM OXIDE 400 (241.3 MG) MG PO TABS
400.0000 mg | ORAL_TABLET | Freq: Every day | ORAL | Status: DC
  Administered 2017-04-15 – 2017-04-16 (×2): 400 mg via ORAL
  Filled 2017-04-14 (×2): qty 1

## 2017-04-14 MED ORDER — TRAMADOL HCL 50 MG PO TABS
50.0000 mg | ORAL_TABLET | Freq: Two times a day (BID) | ORAL | Status: DC | PRN
  Administered 2017-04-14 – 2017-04-16 (×3): 50 mg via ORAL
  Filled 2017-04-14 (×3): qty 1

## 2017-04-14 MED ORDER — PHENOL 1.4 % MT LIQD: 1.0000 | OROMUCOSAL | Status: DC | PRN

## 2017-04-14 MED ORDER — OXYCODONE HCL 5 MG PO TABS
5.0000 mg | ORAL_TABLET | ORAL | Status: DC | PRN
  Administered 2017-04-15: 5 mg via ORAL
  Filled 2017-04-14: qty 1

## 2017-04-14 MED ORDER — PRAMIPEXOLE DIHYDROCHLORIDE 1 MG PO TABS
2.0000 mg | ORAL_TABLET | Freq: Every day | ORAL | Status: DC
  Administered 2017-04-14 – 2017-04-15 (×2): 2 mg via ORAL
  Filled 2017-04-14 (×2): qty 2

## 2017-04-14 MED ORDER — METOCLOPRAMIDE HCL 5 MG PO TABS: 5.0000 mg | ORAL_TABLET | Freq: Three times a day (TID) | ORAL | Status: DC | PRN

## 2017-04-14 MED ORDER — INSULIN ASPART 100 UNIT/ML ~~LOC~~ SOLN
0.0000 [IU] | Freq: Three times a day (TID) | SUBCUTANEOUS | Status: DC
  Administered 2017-04-14: 3 [IU] via SUBCUTANEOUS
  Administered 2017-04-15: 2 [IU] via SUBCUTANEOUS
  Administered 2017-04-15: 3 [IU] via SUBCUTANEOUS
  Administered 2017-04-15: 5 [IU] via SUBCUTANEOUS
  Administered 2017-04-16: 2 [IU] via SUBCUTANEOUS
  Administered 2017-04-16: 3 [IU] via SUBCUTANEOUS

## 2017-04-14 MED ORDER — POLYETHYLENE GLYCOL 3350 17 G PO PACK: 17.0000 g | PACK | Freq: Three times a day (TID) | ORAL | Status: DC | PRN

## 2017-04-14 MED ORDER — ENOXAPARIN SODIUM 40 MG/0.4ML ~~LOC~~ SOLN
40.0000 mg | SUBCUTANEOUS | Status: DC
  Administered 2017-04-15 – 2017-04-16 (×2): 40 mg via SUBCUTANEOUS
  Filled 2017-04-14 (×2): qty 0.4

## 2017-04-14 MED ORDER — FLUTICASONE PROPIONATE 50 MCG/ACT NA SUSP
1.0000 | Freq: Every day | NASAL | Status: DC | PRN
  Filled 2017-04-14: qty 16

## 2017-04-14 MED ORDER — ONDANSETRON HCL 4 MG/2ML IJ SOLN: 4.0000 mg | Freq: Four times a day (QID) | INTRAMUSCULAR | Status: DC | PRN

## 2017-04-14 MED ORDER — OMEGA-3-ACID ETHYL ESTERS 1 G PO CAPS
1.0000 g | ORAL_CAPSULE | Freq: Every day | ORAL | Status: DC
  Administered 2017-04-15 – 2017-04-16 (×2): 1 g via ORAL
  Filled 2017-04-14 (×2): qty 1

## 2017-04-14 NOTE — Progress Notes (Signed)
Patient was I/O cath x 2 yesterday during day shift.  Patient had loose bm but unable to tell if any urine present in bedpan.  Bladder scanned this am 358 mL; foley placed for urinary retention. Order in place.

## 2017-04-14 NOTE — Progress Notes (Addendum)
PROGRESS NOTE    Daisy Larson  QPY:[id number] DOB: [birth year] DOA: 04/08/2017 PCP: [name], MD     Brief Narrative:  Daisy Larson is a 66 y.o. 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 She was at the nursing facility when she got out of bed unassisted to obtain a [place] book and fell. She is experiencing muscle spasms and right upper leg pain with external rotation and shortening of the right lower extremity. She was given 150 g of fentanyl by EMS prior to arrival. Imaging obtained in the ER reveals comminuted fracture of the right femur. Patient underwent right intramedullary fixation 6/11. Postoperatively complicated by hypotension, required 2u pRBC, then had few days of encephalopathy likely in combination of pain medications, anesthesia, and hepatic encephalopathy.   Assessment & Plan:   Principal Problem:   Closed displaced comminuted fracture of shaft of right femur ([place]) Active Problems:   Liver cirrhosis secondary to NASH (HCC)   GERD (gastroesophageal reflux disease)   Sleep apnea   Closed left comminuted femur fracture  s/p intramedullary nailing (HCC)   Charcot's joint of foot, left w/ chronic wounds   Diabetes mellitus   Hypertension   Thrombocytopenia (HCC)   Anemia, macrocytic   Dehydration with hyponatremia   Closed comminuted intertrochanteric fracture of proximal end of right femur (HCC)   Bacteremia   Pressure injury of skin  Closed displaced comminuted fracture of shaft of right femur  -Presents with mechanical fall after unassisted ambulation resulting in femur fracture -Underwent right intramedullary  fixation on 6/11 with Dr. [name]. Had hypotension in PACU post operatively, received 2u pRBC  -PT OT recommending SNF   Acute encephalopathy, likely combination of hepatic encephalopathy, meds -Patient has history of postoperative encephalopathy in the past -Continue rifaximin, lactulose for goal of 3 bowel movements daily. Will decrease dose of lactulose as patient having many BMs now  -Limit narcotics  -Resolved   Sepsis secondary to coag neg staph bacteremia  -UA obtained without infection -CXR obtained stable -Blood cultures with 2 of 2 coag neg staph, possible central line induced, not present at time of admission -Surgical site dressing clean and dry, no erythema  -Central line removed  -Vanco -ID following  -MRI left foot pending   CKD stage 3 -Baseline Cr 1.1-1.3 -Stable, trend BMP   Liver cirrhosis secondary to NASH, with hx hepatic encephalopathy  -Lasix and spironolactone   OSA -Continue nocturnal CPAP   Diabetes mellitus -Hemoglobin A1c was 6.6 on 5/19 -Lantus + Novolog SSI. Dose adjusted  -DM coordinator consult   Hypertension -Lasix and spironolactone   GERD  -Continue Protonix  Charcot's joint of foot, left w/ chronic wounds -Chronic issue followed at [facility] -Presumed hardware infection and has had intra-articular antibody beads placed -Prior to last admission family was using Betadine for wound care at home -She is to follow-up with her primary surgeon Dr. [name] at [facility] in [place]  Thrombocytopenia  -Was on Lovenox for DVT prophylaxis at nursing facility -Stable, monitor   Anemia, macrocytic -Hemoglobin stable and at baseline around 8 -Had hypotension in PACU post operatively, received 2u pRBC  -Transfused 1u pRBC on 6/14 -Stable   Restless leg syndrome -Continue Mirapex  Depression -Continue wellbutrin, cymbalta   Acute urinary retention -Straight cath prn x 3, now foley  placed  Sacral pressure ulcer stage 2, not POA -Wound RN consulted   DVT prophylaxis: SCDs, lovenox  Code Status: Full Family Communication: Husband, daughter at bedside  Disposition Plan: SNF    Consultants:   Orthopedic surgery  ID  Procedures:   None  Antimicrobials:  Anti-infectives    Start     Dose/Rate Route Frequency Ordered Stop   04/13/17 0500  vancomycin (VANCOCIN) IVPB 1000 mg/200 mL premix     1,000 mg 200 mL/hr over 60 Minutes Intravenous Every 12 hours 04/12/17 1208     04/12/17 1300  vancomycin (VANCOCIN) 2,500 mg in sodium chloride 0.9 % 500 mL IVPB     2,500 mg 250 mL/hr over 120 Minutes Intravenous  Once 04/12/17 1208 04/12/17 1450   04/09/17 2330  ceFAZolin (ANCEF) IVPB 2g/100 mL premix     2 g 200 mL/hr over 30 Minutes Intravenous Every 6 hours 04/09/17 2224 04/10/17 0633   04/09/17 0600  ceFAZolin (ANCEF) IVPB 2g/100 mL premix     2 g 200 mL/hr over 30 Minutes Intravenous On call to O.R. 04/08/17 1733 04/09/17 1633   04/08/17 2200  rifaximin (XIFAXAN) tablet 550 mg     550 mg Oral 2 times daily 04/08/17 1737         Subjective:  Her main complaint is her sacral ulcer. She has been having many bowel movements, 3 already this morning. She states that the Gearhart's butt cream helps immensely. She denies any other complaints, no fevers or chills, no chest pain or shortness of breath, no nausea, vomiting or abdominal pain.   Objective: Vitals:   04/13/17 0413 04/13/17 0814 04/13/17 2146 04/14/17 0517  BP: 139/74 120/63 135/62 (!) 116/51  Pulse: 94  98 86  Resp: (!) 22  18 17   Temp: 98.1 F (36.7 C) (!) 96.8 F (36 C) 98.1 F (36.7 C) 97.6 F (36.4 C)  TempSrc: Oral Axillary Oral Oral  SpO2: 98%  100% 99%  Weight:      Height:        Intake/Output Summary (Last 24 hours) at 04/14/17 1215 Last data filed at 04/14/17 1100  Gross per 24 hour  Intake              560 ml  Output             2105 ml  Net            -1545 ml   Filed  Weights   04/08/17 1733 04/09/17 2220  Weight: 122.3 kg (269 lb 10 oz) 126.3 kg (278 lb 7.1 oz)    Examination: General exam: Appears calm and comfortable  Respiratory system: Clear to auscultation. Respiratory effort normal. Cardiovascular system: S1 & S2 heard, RRR. No JVD, murmurs, rubs, gallops or clicks. No pedal edema. Gastrointestinal system: Abdomen is nondistended, soft and nontender. No organomegaly or masses felt. Normal bowel sounds heard. Central nervous system: Alert and oriented, non focal exam, conversational  Extremities: +1 edema or  Skin: Surgical site dressing clean and dry on right hip, left heel with medial wound without drainage, sacral ulcer stage 2 noted with some bleeding   Data Reviewed: I have personally reviewed following labs and imaging studies  CBC:  Recent Labs Lab 04/08/17 1535 04/09/17 0332 04/10/17 0558 04/11/17 0658 04/12/17 0434 04/13/17 0424  WBC 4.1 5.2 7.1 4.8 3.7* 4.5  NEUTROABS 3.0  --   --   --   --   --  HGB 8.5* 8.0* 8.5* 7.7* 7.0* 8.2*  HCT 27.9* 27.3* 28.2* 26.0* 22.9* 25.8*  MCV 100.7* 101.9* 99.3 101.6* 98.7 95.6  PLT 91* 97* 98* 89* 70* 69*   Basic Metabolic Panel:  Recent Labs Lab 04/09/17 0332  04/09/17 2230 04/10/17 0558 04/10/17 1300 04/11/17 0658 04/12/17 0434  NA 135  --   --  136 137 143 137  K 6.0*  < > 5.8* 5.7* 5.3* 4.0 3.6  CL 103  --   --  107 108 114* 109  CO2 28  --   --  26 26 23 23   GLUCOSE 169*  --   --  240* 252* 246* 202*  BUN 17  --   --  24* 23* 22* 17  CREATININE 1.25*  --   --  1.34* 1.28* 1.20* 0.93  CALCIUM 8.0*  --   --  7.4* 7.4* 6.7* 6.8*  < > = values in this interval not displayed. GFR: Estimated Creatinine Clearance: 83.5 mL/min (by C-G formula based on SCr of 0.93 mg/dL). Liver Function Tests: No results for input(s): AST, ALT, ALKPHOS, BILITOT, PROT, ALBUMIN in the last 168 hours. No results for input(s): LIPASE, AMYLASE in the last 168 hours.  Recent Labs Lab 04/08/17 1800   AMMONIA 62*   Coagulation Profile: No results for input(s): INR, PROTIME in the last 168 hours. Cardiac Enzymes: No results for input(s): CKTOTAL, CKMB, CKMBINDEX, TROPONINI in the last 168 hours. BNP (last 3 results) No results for input(s): PROBNP in the last 8760 hours. HbA1C: No results for input(s): HGBA1C in the last 72 hours. CBG:  Recent Labs Lab 04/13/17 1235 04/13/17 1632 04/13/17 2200 04/14/17 0642 04/14/17 1134  GLUCAP 236* 198* 209* 164* 225*   Lipid Profile: No results for input(s): CHOL, HDL, LDLCALC, TRIG, CHOLHDL, LDLDIRECT in the last 72 hours. Thyroid Function Tests: No results for input(s): TSH, T4TOTAL, FREET4, T3FREE, THYROIDAB in the last 72 hours. Anemia Panel: No results for input(s): VITAMINB12, FOLATE, FERRITIN, TIBC, IRON, RETICCTPCT in the last 72 hours. Sepsis Labs: No results for input(s): PROCALCITON, LATICACIDVEN in the last 168 hours.  Recent Results (from the past 240 hour(s))  MRSA PCR Screening     Status: None   Collection Time: 04/08/17  6:00 PM  Result Value Ref Range Status   MRSA by PCR NEGATIVE NEGATIVE Final    Comment:        The GeneXpert MRSA Assay (FDA approved for NASAL specimens only), is one component of a comprehensive MRSA colonization surveillance program. It is not intended to diagnose MRSA infection nor to guide or monitor treatment for MRSA infections.   Culture, blood (routine x 2)     Status: Abnormal   Collection Time: 04/11/17  8:34 AM  Result Value Ref Range Status   Specimen Description BLOOD LEFT ARM  Final   Special Requests   Final    BOTTLES DRAWN AEROBIC ONLY Blood Culture adequate volume   Culture  Setup Time   Final    GRAM POSITIVE COCCI IN CLUSTERS IN PEDIATRIC BOTTLE CRITICAL RESULT CALLED TO, READ BACK BY AND VERIFIED WITH: E MARTIN 04/12/17 @ 77 M VESTAL    Culture STAPHYLOCOCCUS SPECIES (COAGULASE NEGATIVE) (A)  Final   Report Status 04/14/2017 FINAL  Final   Organism ID, Bacteria  STAPHYLOCOCCUS SPECIES (COAGULASE NEGATIVE)  Final      Susceptibility   Staphylococcus species (coagulase negative) - MIC*    CIPROFLOXACIN >=8 RESISTANT Resistant     ERYTHROMYCIN >=8 RESISTANT  Resistant     GENTAMICIN <=0.5 SENSITIVE Sensitive     OXACILLIN >=4 RESISTANT Resistant     TETRACYCLINE >=16 RESISTANT Resistant     VANCOMYCIN 1 SENSITIVE Sensitive     TRIMETH/SULFA 160 RESISTANT Resistant     CLINDAMYCIN >=8 RESISTANT Resistant     RIFAMPIN >=32 RESISTANT Resistant     Inducible Clindamycin NEGATIVE Sensitive     * STAPHYLOCOCCUS SPECIES (COAGULASE NEGATIVE)  Blood Culture ID Panel (Reflexed)     Status: Abnormal   Collection Time: 04/11/17  8:34 AM  Result Value Ref Range Status   Enterococcus species NOT DETECTED NOT DETECTED Final   Listeria monocytogenes NOT DETECTED NOT DETECTED Final   Staphylococcus species DETECTED (A) NOT DETECTED Final    Comment: Methicillin (oxacillin) resistant coagulase negative staphylococcus. Possible blood culture contaminant (unless isolated from more than one blood culture draw or clinical case suggests pathogenicity). No antibiotic treatment is indicated for blood  culture contaminants. CRITICAL RESULT CALLED TO, READ BACK BY AND VERIFIED WITH: E MARTIN 04/12/17 @ 19 M VESTAL    Staphylococcus aureus NOT DETECTED NOT DETECTED Final   Methicillin resistance DETECTED (A) NOT DETECTED Final    Comment: CRITICAL RESULT CALLED TO, READ BACK BY AND VERIFIED WITH: E MARTIN 04/12/17 @ 23 M VESTAL    Streptococcus species NOT DETECTED NOT DETECTED Final   Streptococcus agalactiae NOT DETECTED NOT DETECTED Final   Streptococcus pneumoniae NOT DETECTED NOT DETECTED Final   Streptococcus pyogenes NOT DETECTED NOT DETECTED Final   Acinetobacter baumannii NOT DETECTED NOT DETECTED Final   Enterobacteriaceae species NOT DETECTED NOT DETECTED Final   Enterobacter cloacae complex NOT DETECTED NOT DETECTED Final   Escherichia coli NOT DETECTED  NOT DETECTED Final   Klebsiella oxytoca NOT DETECTED NOT DETECTED Final   Klebsiella pneumoniae NOT DETECTED NOT DETECTED Final   Proteus species NOT DETECTED NOT DETECTED Final   Serratia marcescens NOT DETECTED NOT DETECTED Final   Haemophilus influenzae NOT DETECTED NOT DETECTED Final   Neisseria meningitidis NOT DETECTED NOT DETECTED Final   Pseudomonas aeruginosa NOT DETECTED NOT DETECTED Final   Candida albicans NOT DETECTED NOT DETECTED Final   Candida glabrata NOT DETECTED NOT DETECTED Final   Candida krusei NOT DETECTED NOT DETECTED Final   Candida parapsilosis NOT DETECTED NOT DETECTED Final   Candida tropicalis NOT DETECTED NOT DETECTED Final  Culture, blood (routine x 2)     Status: Abnormal   Collection Time: 04/11/17  8:36 AM  Result Value Ref Range Status   Specimen Description BLOOD LEFT ANTECUBITAL  Final   Special Requests   Final    BOTTLES DRAWN AEROBIC ONLY Blood Culture adequate volume   Culture  Setup Time   Final    GRAM POSITIVE COCCI IN CLUSTERS IN PEDIATRIC BOTTLE CRITICAL RESULT CALLED TO, READ BACK BY AND VERIFIED WITH: E MARTIN 04/12/17 @ 41 M VESTAL    Culture (A)  Final    STAPHYLOCOCCUS SPECIES (COAGULASE NEGATIVE) SUSCEPTIBILITIES PERFORMED ON PREVIOUS CULTURE WITHIN THE LAST 5 DAYS.    Report Status 04/14/2017 FINAL  Final       Radiology Studies: Dg Foot Complete Left  Result Date: 04/13/2017 CLINICAL DATA:  Assess for osteomyelitis at the left ankle, given underlying bacteremia. Retained hardware. Initial encounter. EXAM: LEFT FOOT - COMPLETE 3+ VIEW COMPARISON:  Left foot radiographs performed 03/16/2017 FINDINGS: No new osseous erosions are seen to suggest significant acute osteomyelitis, though evaluation for osteomyelitis is limited on radiograph. There is minimal chronic loosening  about the patient's tibiocalcaneal screws. There is chronic collapse of the talus and calcaneus, with marked degeneration about the midfoot. This is concerning  for Charcot joint. Calcification is noted at the distal Achilles tendon. Soft tissue swelling is noted about the hindfoot and midfoot. There is a chronic incompletely healed fracture through the proximal aspect of the first distal phalanx. IMPRESSION: 1. No new osseous erosion seen to suggest significant acute osteomyelitis, though evaluation for osteomyelitis is limited on radiograph. If there is significant clinical concern for osteomyelitis, MRI would be helpful for further evaluation. 2. Underlying chronic collapse of the talus and calcaneus, with marked degeneration about the midfoot, concerning for Charcot joint. Minimal chronic loosening again noted about the patient's tibiocalcaneal screws. 3. Chronic incompletely healed fracture through the proximal aspect of the first distal phalanx. Electronically Signed   By: Garald Balding M.D.   On: 04/13/2017 22:18      Scheduled Meds: . aspirin  81 mg Oral Daily  . buPROPion  300 mg Oral Daily  . DULoxetine  60 mg Oral Daily  . enoxaparin (LOVENOX) injection  40 mg Subcutaneous Q24H  . [START ON 04/15/2017] furosemide  60 mg Oral Daily  . gabapentin  600 mg Oral TID  . Gerhardt's butt cream   Topical BID  . insulin aspart  0-5 Units Subcutaneous QHS  . insulin aspart  0-9 Units Subcutaneous TID WC  . insulin aspart  12 Units Subcutaneous TID WC  . insulin glargine  30 Units Subcutaneous BID  . lactulose  30 g Oral TID  . magnesium oxide  400 mg Oral Daily  . omega-3 acid ethyl esters  1 g Oral Daily  . pantoprazole  40 mg Oral QHS  . pramipexole  2 mg Oral QHS  . rifaximin  550 mg Oral BID  . spironolactone  75 mg Oral Daily   Continuous Infusions: . methocarbamol (ROBAXIN)  IV    . vancomycin Stopped (04/14/17 0734)     LOS: 6 days    Time spent: 30 minutes   Dessa Phi, DO [facility] [email or web address] Password [facility] 04/14/2017, 12:15 PM

## 2017-04-14 NOTE — Progress Notes (Signed)
Patient family will apply CPAP for tonight. RN aware.

## 2017-04-14 NOTE — Progress Notes (Signed)
[facility] for Infectious Disease   Reason for visit: Follow up on CoNS bacteremia  Interval History: repeat blood cultures have been sent.  Xray without definitive osteo; MRI ordered  Physical Exam: Constitutional:  Vitals:   04/14/17 0517 04/14/17 1614  BP: (!) 116/51 118/60  Pulse: 86 97  Resp: 17 16  Temp: 97.6 F (36.4 C) 98.1 F (36.7 C)   patient appears in NAD Respiratory: Normal respiratory effort; CTA B Cardiovascular: RRR GI: soft, nt, nd  Review of Systems: Constitutional: negative for fevers and chills Gastrointestinal: negative for diarrhea  Lab Results  Component Value Date   WBC 4.5 04/13/2017   HGB 8.2 (L) 04/13/2017   HCT 25.8 (L) 04/13/2017   MCV 95.6 04/13/2017   PLT 69 (L) 04/13/2017    Lab Results  Component Value Date   CREATININE 0.93 04/12/2017   BUN 17 04/12/2017   NA 137 04/12/2017   K 3.6 04/12/2017   CL 109 04/12/2017   CO2 23 04/12/2017    Lab Results  Component Value Date   ALT 20 03/21/2017   AST 41 03/21/2017   ALKPHOS 111 03/21/2017     Microbiology: Recent Results (from the past 240 hour(s))  MRSA PCR Screening     Status: None   Collection Time: 04/08/17  6:00 PM  Result Value Ref Range Status   MRSA by PCR NEGATIVE NEGATIVE Final    Comment:        The GeneXpert MRSA Assay (FDA approved for NASAL specimens only), is one component of a comprehensive MRSA colonization surveillance program. It is not intended to diagnose MRSA infection nor to guide or monitor treatment for MRSA infections.   Culture, blood (routine x 2)     Status: Abnormal   Collection Time: 04/11/17  8:34 AM  Result Value Ref Range Status   Specimen Description BLOOD LEFT ARM  Final   Special Requests   Final    BOTTLES DRAWN AEROBIC ONLY Blood Culture adequate volume   Culture  Setup Time   Final    GRAM POSITIVE COCCI IN CLUSTERS IN PEDIATRIC BOTTLE CRITICAL RESULT CALLED TO, READ BACK BY AND VERIFIED WITH: E MARTIN 04/12/17 @  52 M VESTAL    Culture STAPHYLOCOCCUS SPECIES (COAGULASE NEGATIVE) (A)  Final   Report Status 04/14/2017 FINAL  Final   Organism ID, Bacteria STAPHYLOCOCCUS SPECIES (COAGULASE NEGATIVE)  Final      Susceptibility   Staphylococcus species (coagulase negative) - MIC*    CIPROFLOXACIN >=8 RESISTANT Resistant     ERYTHROMYCIN >=8 RESISTANT Resistant     GENTAMICIN <=0.5 SENSITIVE Sensitive     OXACILLIN >=4 RESISTANT Resistant     TETRACYCLINE >=16 RESISTANT Resistant     VANCOMYCIN 1 SENSITIVE Sensitive     TRIMETH/SULFA 160 RESISTANT Resistant     CLINDAMYCIN >=8 RESISTANT Resistant     RIFAMPIN >=32 RESISTANT Resistant     Inducible Clindamycin NEGATIVE Sensitive     * STAPHYLOCOCCUS SPECIES (COAGULASE NEGATIVE)  Blood Culture ID Panel (Reflexed)     Status: Abnormal   Collection Time: 04/11/17  8:34 AM  Result Value Ref Range Status   Enterococcus species NOT DETECTED NOT DETECTED Final   Listeria monocytogenes NOT DETECTED NOT DETECTED Final   Staphylococcus species DETECTED (A) NOT DETECTED Final    Comment: Methicillin (oxacillin) resistant coagulase negative staphylococcus. Possible blood culture contaminant (unless isolated from more than one blood culture draw or clinical case suggests pathogenicity). No antibiotic treatment is indicated  for blood  culture contaminants. CRITICAL RESULT CALLED TO, READ BACK BY AND VERIFIED WITH: E MARTIN 04/12/17 @ 33 M VESTAL    Staphylococcus aureus NOT DETECTED NOT DETECTED Final   Methicillin resistance DETECTED (A) NOT DETECTED Final    Comment: CRITICAL RESULT CALLED TO, READ BACK BY AND VERIFIED WITH: E MARTIN 04/12/17 @ 13 M VESTAL    Streptococcus species NOT DETECTED NOT DETECTED Final   Streptococcus agalactiae NOT DETECTED NOT DETECTED Final   Streptococcus pneumoniae NOT DETECTED NOT DETECTED Final   Streptococcus pyogenes NOT DETECTED NOT DETECTED Final   Acinetobacter baumannii NOT DETECTED NOT DETECTED Final    Enterobacteriaceae species NOT DETECTED NOT DETECTED Final   Enterobacter cloacae complex NOT DETECTED NOT DETECTED Final   Escherichia coli NOT DETECTED NOT DETECTED Final   Klebsiella oxytoca NOT DETECTED NOT DETECTED Final   Klebsiella pneumoniae NOT DETECTED NOT DETECTED Final   Proteus species NOT DETECTED NOT DETECTED Final   Serratia marcescens NOT DETECTED NOT DETECTED Final   Haemophilus influenzae NOT DETECTED NOT DETECTED Final   Neisseria meningitidis NOT DETECTED NOT DETECTED Final   Pseudomonas aeruginosa NOT DETECTED NOT DETECTED Final   Candida albicans NOT DETECTED NOT DETECTED Final   Candida glabrata NOT DETECTED NOT DETECTED Final   Candida krusei NOT DETECTED NOT DETECTED Final   Candida parapsilosis NOT DETECTED NOT DETECTED Final   Candida tropicalis NOT DETECTED NOT DETECTED Final  Culture, blood (routine x 2)     Status: Abnormal   Collection Time: 04/11/17  8:36 AM  Result Value Ref Range Status   Specimen Description BLOOD LEFT ANTECUBITAL  Final   Special Requests   Final    BOTTLES DRAWN AEROBIC ONLY Blood Culture adequate volume   Culture  Setup Time   Final    GRAM POSITIVE COCCI IN CLUSTERS IN PEDIATRIC BOTTLE CRITICAL RESULT CALLED TO, READ BACK BY AND VERIFIED WITH: E MARTIN 04/12/17 @ 38 M VESTAL    Culture (A)  Final    STAPHYLOCOCCUS SPECIES (COAGULASE NEGATIVE) SUSCEPTIBILITIES PERFORMED ON PREVIOUS CULTURE WITHIN THE LAST 5 DAYS.    Report Status 04/14/2017 FINAL  Final    Impression/Plan:  1. CoNS bacteremia - repeat blood cultures sent.  Foot possible source but xray did not suggest.  MRI oredered.  If no signs of osteo, then c/w line infection.   2. Sacral wound - improved with Butt balm.

## 2017-04-15 ENCOUNTER — Inpatient Hospital Stay (HOSPITAL_COMMUNITY): Payer: Medicare Other

## 2017-04-15 LAB — GLUCOSE, CAPILLARY
GLUCOSE-CAPILLARY: 153 mg/dL — AB (ref 65–99)
Glucose-Capillary: 245 mg/dL — ABNORMAL HIGH (ref 65–99)
Glucose-Capillary: 254 mg/dL — ABNORMAL HIGH (ref 65–99)
Glucose-Capillary: 181 mg/dL — ABNORMAL HIGH (ref 65–99)

## 2017-04-15 LAB — BASIC METABOLIC PANEL
ANION GAP: 6 (ref 5–15)
BUN: 16 mg/dL (ref 6–20)
CALCIUM: 8.3 mg/dL — AB (ref 8.9–10.3)
CO2: 23 mmol/L (ref 22–32)
CREATININE: 1.26 mg/dL — AB (ref 0.44–1.00)
Chloride: 106 mmol/L (ref 101–111)
GFR calc Af Amer: 50 mL/min — ABNORMAL LOW (ref >60)
GFR calc non Af Amer: 43 mL/min — ABNORMAL LOW (ref >60)
GLUCOSE: 283 mg/dL — AB (ref 65–99)
Potassium: 3.9 mmol/L (ref 3.5–5.1)
Sodium: 135 mmol/L (ref 135–145)

## 2017-04-15 LAB — C-REACTIVE PROTEIN: CRP: 5.5 mg/dL — AB (ref <1.0)

## 2017-04-15 LAB — SEDIMENTATION RATE: Sed Rate: 61 mm/hr — ABNORMAL HIGH (ref 0–22)

## 2017-04-15 LAB — VANCOMYCIN, TROUGH: Vancomycin Tr: 33 ug/mL (ref 15–20)

## 2017-04-15 MED ORDER — GADOBENATE DIMEGLUMINE 529 MG/ML IV SOLN
20.0000 mL | Freq: Once | INTRAVENOUS | Status: AC
  Administered 2017-04-15: 20 mL via INTRAVENOUS

## 2017-04-15 MED ORDER — TAMSULOSIN HCL 0.4 MG PO CAPS
0.4000 mg | ORAL_CAPSULE | Freq: Every day | ORAL | Status: DC
  Administered 2017-04-15 – 2017-04-16 (×2): 0.4 mg via ORAL
  Filled 2017-04-15 (×2): qty 1

## 2017-04-15 NOTE — Progress Notes (Signed)
Pt doesn't currently need any assistance applying CPAP. Patient family will apply CPAP for the night.

## 2017-04-15 NOTE — Progress Notes (Signed)
Patient ID: Daisy Larson, female   DOB: [birth year], 66 y.o.   MRN: [id number] Subjective: 6 Days Post-Op Procedure(s) (LRB): INTRAMEDULLARY (IM) NAIL INTERTROCHANTRIC (Right)    Very miserable situation given bilateral lower extremity involvement. Pain tolerable   Objective:   VITALS:   Vitals:   04/14/17 1614 04/14/17 2123  BP: 118/60 (!) 128/56  Pulse: 97 (!) 105  Resp: 16 18  Temp: 98.1 F (36.7 C) 97.5 F (36.4 C)    Neurovascular intact Incision: dressing C/D/I, right lower extremity  LABS  Recent Labs  04/13/17 0424 04/14/17 1619  HGB 8.2* 9.1*  HCT 25.8* 28.8*  WBC 4.5 4.1  PLT 69* 84*     Recent Labs  04/14/17 1619  NA 137  K 4.2  BUN 16  CREATININE 1.23*  GLUCOSE 250*    No results for input(s): LABPT, INR in the last 72 hours.   Assessment/Plan: 6 Days Post-Op Procedure(s) (LRB): INTRAMEDULLARY (IM) NAIL INTERTROCHANTRIC (Right)   Up with therapy as tolerable Discharge to SNF - per medical/ortho stability Weight bearing status as directed by [employer]

## 2017-04-15 NOTE — Progress Notes (Addendum)
PROGRESS NOTE    Daisy Larson  PXT:[id number] DOB: 13-Aug-1951 DOA: 04/08/2017 PCP: [name], MD     Brief Narrative:  Daisy Larson is a 66 y.o. 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 She was at the nursing facility when she got out of bed unassisted to obtain a [place] book and fell. Imaging obtained in the ER reveals comminuted fracture of the right femur. Patient underwent right intramedullary fixation 6/11.   Postoperative course complicated by hypotension, required 2u pRBC, then had few days of encephalopathy likely in combination of pain medications, anesthesia, and hepatic encephalopathy. Altered mental status resolved. She had a fever 100.4, and blood cultures were obtained. This showed 2 of 2 coag negative staph. She was started on vancomycin, infectious disease consulted.  Assessment & Plan:   Principal Problem:   Closed displaced comminuted fracture of shaft of right femur ([place]) Active Problems:   Liver cirrhosis secondary to NASH (HCC)   GERD (gastroesophageal reflux disease)   Sleep apnea   Closed left comminuted femur fracture  s/p intramedullary nailing (HCC)   Charcot's joint of foot, left w/ chronic wounds   Diabetes mellitus   Hypertension   Thrombocytopenia (HCC)   Anemia, macrocytic   Dehydration with hyponatremia   Closed comminuted intertrochanteric fracture of proximal end of right femur (HCC)   Bacteremia   Pressure injury of skin  Closed displaced comminuted fracture of shaft of right femur  -Presents with mechanical fall after unassisted ambulation resulting in femur fracture -Underwent right  intramedullary fixation on 6/11 with Dr. [name]. Had hypotension in PACU post operatively, received 2u pRBC  -PT OT recommending SNF   Acute encephalopathy, likely combination of hepatic encephalopathy, meds -Patient has history of postoperative encephalopathy in the past -Continue rifaximin, lactulose for goal of 3 bowel movements daily -Limit narcotics  -Resolved   Sepsis secondary to coag neg staph bacteremia  -Blood cultures with 2 of 2 coag neg staph, central line induced, not present at time of admission -Central line removed  -MRI left foot negative, PICC for vanco through 6/20 -Repeat culture negative so far -Appreciate ID   CKD stage 3 -Baseline Cr 1.1-1.3 -Stable, trend BMP   Liver cirrhosis secondary to NASH, with hx hepatic encephalopathy  -Lasix and spironolactone -Continue rifaximin, lactulose  OSA -Continue nocturnal CPAP   Diabetes mellitus -Hemoglobin A1c was 6.6 on 5/19 -Lantus + Novolog SSI. Dose adjusted   -DM coordinator consult   Hypertension -Lasix and spironolactone   GERD  -Continue Protonix  Charcot's joint of foot, left w/ chronic wounds -Chronic issue followed at [facility] -Presumed hardware infection and has had intra-articular antibody beads placed -Prior to last admission family was using Betadine for wound care at home -She is to follow-up with her primary surgeon Dr. [name] at [facility] in [place]  Thrombocytopenia  -Was on Lovenox for DVT prophylaxis at nursing facility -Stable, monitor   Anemia, macrocytic -Hemoglobin stable and at baseline around 8 -Had hypotension in PACU post operatively, received 2u pRBC  -Transfused 1u pRBC on 6/14 for Hgb 7.0  -Stable   Restless leg syndrome -Continue Mirapex  Depression -Continue wellbutrin, cymbalta   Acute urinary retention -Straight cath prn x 3,  now foley placed. She has had this issue of postoperative urinary retention in October 2017  and had seen a urologist at [facility]. Records reviewed. She was started on Flomax at that time. -Flomax  Sacral pressure ulcer stage 2, not POA -Wound RN consulted   DVT prophylaxis: SCDs, lovenox  Code Status: Full Family Communication: Husband, daughter at bedside  Disposition Plan: SNF    Consultants:   Orthopedic surgery  ID  Procedures:   None  Antimicrobials:  Anti-infectives    Start     Dose/Rate Route Frequency Ordered Stop   04/15/17 1000  rifaximin (XIFAXAN) tablet 550 mg     550 mg Oral 2 times daily 04/14/17 1259     04/14/17 1800  vancomycin (VANCOCIN) IVPB 1000 mg/200 mL premix     1,000 mg 200 mL/hr over 60 Minutes Intravenous Every 12 hours 04/14/17 1259     04/13/17 0500  vancomycin (VANCOCIN) IVPB 1000 mg/200 mL premix  Status:  Discontinued     1,000 mg 200 mL/hr over 60 Minutes Intravenous Every 12 hours 04/12/17 1208 04/14/17 1245   04/12/17 1300  vancomycin (VANCOCIN) 2,500 mg in sodium chloride 0.9 % 500 mL IVPB     2,500 mg 250 mL/hr over 120 Minutes Intravenous  Once 04/12/17 1208 04/12/17 1450   04/09/17 2330  ceFAZolin (ANCEF) IVPB 2g/100 mL premix     2 g 200 mL/hr over 30 Minutes Intravenous Every 6 hours 04/09/17 2224 04/10/17 0633   04/09/17 0600  ceFAZolin (ANCEF) IVPB 2g/100 mL premix     2 g 200 mL/hr over 30 Minutes Intravenous On call to O.R. 04/08/17 1733 04/09/17 1633   04/08/17 2200  rifaximin (XIFAXAN) tablet 550 mg  Status:  Discontinued     550 mg Oral 2 times daily 04/08/17 1737 04/14/17 1245       Subjective:  No new complaints today. Doing well overall. Denies any fevers or chills, no chest pain or shortness of breath. No nausea, vomiting, abdominal pain.   Objective: Vitals:   04/13/17 2146 04/14/17 0517 04/14/17 1614 04/14/17 2123  BP: 135/62 (!) 116/51 118/60 (!) 128/56  Pulse: 98 86 97 (!) 105  Resp: 18 17 16 18   Temp: 98.1 F (36.7 C) 97.6 F (36.4 C) 98.1 F (36.7 C) 97.5 F (36.4 C)  TempSrc: Oral  Oral Axillary Oral  SpO2: 100% 99% 100% 100%  Weight:      Height:        Intake/Output Summary (Last 24 hours) at 04/15/17 1253 Last data filed at 04/15/17 1013  Gross per 24 hour  Intake              480 ml  Output              500 ml  Net              -20 ml   Filed Weights   04/08/17 1733 04/09/17 2220  Weight: 122.3 kg (269 lb 10 oz) 126.3 kg (278 lb 7.1 oz)    Examination: General exam: Appears calm and comfortable  Respiratory system: Clear to auscultation. Respiratory effort normal. Cardiovascular system: S1 & S2 heard, RRR. No JVD, murmurs, rubs, gallops or clicks. No pedal edema. Gastrointestinal system: Abdomen is nondistended, soft and nontender. No organomegaly or masses felt. Normal bowel sounds heard. Central nervous system: Alert and oriented, non focal exam, conversational  Extremities: +1 edema  Skin: Surgical site dressing clean and dry on right hip, left heel with  medial wound without drainage, sacral ulcer stage 2 noted with some bleeding   Data Reviewed: I have personally reviewed following labs and imaging studies  CBC:  Recent Labs Lab 04/08/17 1535  04/10/17 0558 04/11/17 0658 04/12/17 0434 04/13/17 0424 04/14/17 1619  WBC 4.1  < > 7.1 4.8 3.7* 4.5 4.1  NEUTROABS 3.0  --   --   --   --   --   --   HGB 8.5*  < > 8.5* 7.7* 7.0* 8.2* 9.1*  HCT 27.9*  < > 28.2* 26.0* 22.9* 25.8* 28.8*  MCV 100.7*  < > 99.3 101.6* 98.7 95.6 98.3  PLT 91*  < > 98* 89* 70* 69* 84*  < > = values in this interval not displayed. Basic Metabolic Panel:  Recent Labs Lab 04/10/17 0558 04/10/17 1300 04/11/17 0658 04/12/17 0434 04/14/17 1619  NA 136 137 143 137 137  K 5.7* 5.3* 4.0 3.6 4.2  CL 107 108 114* 109 107  CO2 26 26 23 23 23   GLUCOSE 240* 252* 246* 202* 250*  BUN 24* 23* 22* 17 16  CREATININE 1.34* 1.28* 1.20* 0.93 1.23*  CALCIUM 7.4* 7.4* 6.7* 6.8* 8.4*   GFR: Estimated Creatinine Clearance: 63.1 mL/min (A) (by C-G formula based on SCr of 1.23 mg/dL  (H)). Liver Function Tests: No results for input(s): AST, ALT, ALKPHOS, BILITOT, PROT, ALBUMIN in the last 168 hours. No results for input(s): LIPASE, AMYLASE in the last 168 hours.  Recent Labs Lab 04/08/17 1800  AMMONIA 62*   Coagulation Profile: No results for input(s): INR, PROTIME in the last 168 hours. Cardiac Enzymes: No results for input(s): CKTOTAL, CKMB, CKMBINDEX, TROPONINI in the last 168 hours. BNP (last 3 results) No results for input(s): PROBNP in the last 8760 hours. HbA1C: No results for input(s): HGBA1C in the last 72 hours. CBG:  Recent Labs Lab 04/14/17 0642 04/14/17 1134 04/14/17 1608 04/14/17 2123 04/15/17 0610  GLUCAP 164* 225* 241* 215* 153*   Lipid Profile: No results for input(s): CHOL, HDL, LDLCALC, TRIG, CHOLHDL, LDLDIRECT in the last 72 hours. Thyroid Function Tests: No results for input(s): TSH, T4TOTAL, FREET4, T3FREE, THYROIDAB in the last 72 hours. Anemia Panel: No results for input(s): VITAMINB12, FOLATE, FERRITIN, TIBC, IRON, RETICCTPCT in the last 72 hours. Sepsis Labs: No results for input(s): PROCALCITON, LATICACIDVEN in the last 168 hours.  Recent Results (from the past 240 hour(s))  MRSA PCR Screening     Status: None   Collection Time: 04/08/17  6:00 PM  Result Value Ref Range Status   MRSA by PCR NEGATIVE NEGATIVE Final    Comment:        The GeneXpert MRSA Assay (FDA approved for NASAL specimens only), is one component of a comprehensive MRSA colonization surveillance program. It is not intended to diagnose MRSA infection nor to guide or monitor treatment for MRSA infections.   Culture, blood (routine x 2)     Status: Abnormal   Collection Time: 04/11/17  8:34 AM  Result Value Ref Range Status   Specimen Description BLOOD LEFT ARM  Final   Special Requests   Final    BOTTLES DRAWN AEROBIC ONLY Blood Culture adequate volume   Culture  Setup Time   Final    GRAM POSITIVE COCCI IN CLUSTERS IN PEDIATRIC  BOTTLE CRITICAL RESULT CALLED TO, READ BACK BY AND VERIFIED WITH: E MARTIN 04/12/17 @ 59 M VESTAL    Culture STAPHYLOCOCCUS SPECIES (COAGULASE NEGATIVE) (A)  Final   Report Status 04/14/2017  FINAL  Final   Organism ID, Bacteria STAPHYLOCOCCUS SPECIES (COAGULASE NEGATIVE)  Final      Susceptibility   Staphylococcus species (coagulase negative) - MIC*    CIPROFLOXACIN >=8 RESISTANT Resistant     ERYTHROMYCIN >=8 RESISTANT Resistant     GENTAMICIN <=0.5 SENSITIVE Sensitive     OXACILLIN >=4 RESISTANT Resistant     TETRACYCLINE >=16 RESISTANT Resistant     VANCOMYCIN 1 SENSITIVE Sensitive     TRIMETH/SULFA 160 RESISTANT Resistant     CLINDAMYCIN >=8 RESISTANT Resistant     RIFAMPIN >=32 RESISTANT Resistant     Inducible Clindamycin NEGATIVE Sensitive     * STAPHYLOCOCCUS SPECIES (COAGULASE NEGATIVE)  Blood Culture ID Panel (Reflexed)     Status: Abnormal   Collection Time: 04/11/17  8:34 AM  Result Value Ref Range Status   Enterococcus species NOT DETECTED NOT DETECTED Final   Listeria monocytogenes NOT DETECTED NOT DETECTED Final   Staphylococcus species DETECTED (A) NOT DETECTED Final    Comment: Methicillin (oxacillin) resistant coagulase negative staphylococcus. Possible blood culture contaminant (unless isolated from more than one blood culture draw or clinical case suggests pathogenicity). No antibiotic treatment is indicated for blood  culture contaminants. CRITICAL RESULT CALLED TO, READ BACK BY AND VERIFIED WITH: E MARTIN 04/12/17 @ 56 M VESTAL    Staphylococcus aureus NOT DETECTED NOT DETECTED Final   Methicillin resistance DETECTED (A) NOT DETECTED Final    Comment: CRITICAL RESULT CALLED TO, READ BACK BY AND VERIFIED WITH: E MARTIN 04/12/17 @ 34 M VESTAL    Streptococcus species NOT DETECTED NOT DETECTED Final   Streptococcus agalactiae NOT DETECTED NOT DETECTED Final   Streptococcus pneumoniae NOT DETECTED NOT DETECTED Final   Streptococcus pyogenes NOT DETECTED NOT  DETECTED Final   Acinetobacter baumannii NOT DETECTED NOT DETECTED Final   Enterobacteriaceae species NOT DETECTED NOT DETECTED Final   Enterobacter cloacae complex NOT DETECTED NOT DETECTED Final   Escherichia coli NOT DETECTED NOT DETECTED Final   Klebsiella oxytoca NOT DETECTED NOT DETECTED Final   Klebsiella pneumoniae NOT DETECTED NOT DETECTED Final   Proteus species NOT DETECTED NOT DETECTED Final   Serratia marcescens NOT DETECTED NOT DETECTED Final   Haemophilus influenzae NOT DETECTED NOT DETECTED Final   Neisseria meningitidis NOT DETECTED NOT DETECTED Final   Pseudomonas aeruginosa NOT DETECTED NOT DETECTED Final   Candida albicans NOT DETECTED NOT DETECTED Final   Candida glabrata NOT DETECTED NOT DETECTED Final   Candida krusei NOT DETECTED NOT DETECTED Final   Candida parapsilosis NOT DETECTED NOT DETECTED Final   Candida tropicalis NOT DETECTED NOT DETECTED Final  Culture, blood (routine x 2)     Status: Abnormal   Collection Time: 04/11/17  8:36 AM  Result Value Ref Range Status   Specimen Description BLOOD LEFT ANTECUBITAL  Final   Special Requests   Final    BOTTLES DRAWN AEROBIC ONLY Blood Culture adequate volume   Culture  Setup Time   Final    GRAM POSITIVE COCCI IN CLUSTERS IN PEDIATRIC BOTTLE CRITICAL RESULT CALLED TO, READ BACK BY AND VERIFIED WITH: E MARTIN 04/12/17 @ 29 M VESTAL    Culture (A)  Final    STAPHYLOCOCCUS SPECIES (COAGULASE NEGATIVE) SUSCEPTIBILITIES PERFORMED ON PREVIOUS CULTURE WITHIN THE LAST 5 DAYS.    Report Status 04/14/2017 FINAL  Final  Culture, blood (routine x 2)     Status: None (Preliminary result)   Collection Time: 04/14/17  7:42 AM  Result Value Ref Range Status  Specimen Description BLOOD LEFT HAND  Final   Special Requests IN PEDIATRIC BOTTLE Blood Culture adequate volume  Final   Culture NO GROWTH 1 DAY  Final   Report Status PENDING  Incomplete  Culture, blood (routine x 2)     Status: None (Preliminary result)    Collection Time: 04/14/17  7:47 AM  Result Value Ref Range Status   Specimen Description BLOOD LEFT HAND  Final   Special Requests IN PEDIATRIC BOTTLE Blood Culture adequate volume  Final   Culture NO GROWTH 1 DAY  Final   Report Status PENDING  Incomplete       Radiology Studies: Dg Foot Complete Left  Result Date: 04/13/2017 CLINICAL DATA:  Assess for osteomyelitis at the left ankle, given underlying bacteremia. Retained hardware. Initial encounter. EXAM: LEFT FOOT - COMPLETE 3+ VIEW COMPARISON:  Left foot radiographs performed 03/16/2017 FINDINGS: No new osseous erosions are seen to suggest significant acute osteomyelitis, though evaluation for osteomyelitis is limited on radiograph. There is minimal chronic loosening about the patient's tibiocalcaneal screws. There is chronic collapse of the talus and calcaneus, with marked degeneration about the midfoot. This is concerning for Charcot joint. Calcification is noted at the distal Achilles tendon. Soft tissue swelling is noted about the hindfoot and midfoot. There is a chronic incompletely healed fracture through the proximal aspect of the first distal phalanx. IMPRESSION: 1. No new osseous erosion seen to suggest significant acute osteomyelitis, though evaluation for osteomyelitis is limited on radiograph. If there is significant clinical concern for osteomyelitis, MRI would be helpful for further evaluation. 2. Underlying chronic collapse of the talus and calcaneus, with marked degeneration about the midfoot, concerning for Charcot joint. Minimal chronic loosening again noted about the patient's tibiocalcaneal screws. 3. Chronic incompletely healed fracture through the proximal aspect of the first distal phalanx. Electronically Signed   By: Garald Balding M.D.   On: 04/13/2017 22:18      Scheduled Meds: . aspirin  81 mg Oral Daily  . buPROPion  300 mg Oral Daily  . DULoxetine  60 mg Oral Daily  . enoxaparin (LOVENOX) injection  40 mg  Subcutaneous Q24H  . furosemide  60 mg Oral Daily  . gabapentin  600 mg Oral TID  . Gerhardt's butt cream   Topical BID  . insulin aspart  0-5 Units Subcutaneous QHS  . insulin aspart  0-9 Units Subcutaneous TID WC  . insulin aspart  12 Units Subcutaneous TID WC  . insulin glargine  30 Units Subcutaneous BID  . lactulose  30 g Oral TID  . magnesium oxide  400 mg Oral Daily  . omega-3 acid ethyl esters  1 g Oral Daily  . pantoprazole  40 mg Oral QHS  . pramipexole  2 mg Oral QHS  . rifaximin  550 mg Oral BID  . spironolactone  75 mg Oral Daily   Continuous Infusions: . methocarbamol (ROBAXIN)  IV    . vancomycin Stopped (04/15/17 0700)     LOS: 7 days    Time spent: 30 minutes   Dessa Phi, DO [facility] [email or web address] Password [facility] 04/15/2017, 12:53 PM

## 2017-04-15 NOTE — Progress Notes (Signed)
Pharmacy Antibiotic Note  Daisy Larson is a 66 y.o. female with recent IM nailing for L-femur fracture in May '18 who presented on 04/08/2017 with fall and hip pain. The patient is now s/p R-femur IM fixation on 04/09/17.  Pharmacy consulted to manage vancomycin for MRSE bacteremia and rule out osteomyelitis.  Patient's SCr is trending up again.  She remains afebrile and her WBC is WNL.   Plan: - Continue vanc 1gm IV Q12H - Monitor renal fxn, repeat BCx, vanc trough this PM given increasing SCr   Height: 5\' 8"  (172.7 cm) Weight: 278 lb 7.1 oz (126.3 kg) IBW/kg (Calculated) : 63.9  Temp (24hrs), Avg:97.8 F (36.6 C), Min:97.5 F (36.4 C), Max:98.1 F (36.7 C)   Recent Labs Lab 04/10/17 0558 04/10/17 1300 04/11/17 0658 04/12/17 0434 04/13/17 0424 04/14/17 1619  WBC 7.1  --  4.8 3.7* 4.5 4.1  CREATININE 1.34* 1.28* 1.20* 0.93  --  1.23*    Estimated Creatinine Clearance: 63.1 mL/min (A) (by C-G formula based on SCr of 1.23 mg/dL (H)).    Allergies  Allergen Reactions  . Other Rash    Blisters Per Patient - she has been told she can not have general anesthesia "unless life-threatening"  . Tape   . Oxymorphone     lethargic  . Barbiturates Dermatitis and Other (See Comments)    Drowsiness, patient states that Barbiturates & Narcotics make her extremely sleepy and drowsy 24 Apr 2013 Drowsiness  "sleeps forever"    Cefazolin pre/post-op 6/11 >> 6/12 Vanc 6/14 >> 6/16  6/17 VT =   6/13 BCx - 2/2 MRSE 6/16 BCx -    Daisy Larson, PharmD, BCPS Pager:  [phone] 04/15/2017, 9:59 AM

## 2017-04-15 NOTE — Progress Notes (Signed)
Pharmacy Antibiotic Note  Daisy Larson is a 66 y.o. female with recent IM nailing for L-femur fracture in May '18 who presented on 04/08/2017 with fall and hip pain. The patient is now s/p R-femur IM fixation on 04/09/17.    vt - 33   Plan: Hold vanc 24 hr level in am   Height: 5\' 8"  (172.7 cm) Weight: 278 lb 7.1 oz (126.3 kg) IBW/kg (Calculated) : 63.9  Temp (24hrs), Avg:97.5 F (36.4 C), Min:97.4 F (36.3 C), Max:97.5 F (36.4 C)   Recent Labs Lab 04/10/17 0558 04/10/17 1300 04/11/17 0658 04/12/17 0434 04/13/17 0424 04/14/17 1619 04/15/17 1530  WBC 7.1  --  4.8 3.7* 4.5 4.1  --   CREATININE 1.34* 1.28* 1.20* 0.93  --  1.23* 1.26*  VANCOTROUGH  --   --   --   --   --   --  33*    Estimated Creatinine Clearance: 61.6 mL/min (A) (by C-G formula based on SCr of 1.26 mg/dL (H)).    Allergies  Allergen Reactions  . Other Rash    Blisters Per Patient - she has been told she can not have general anesthesia "unless life-threatening"  . Tape   . Oxymorphone     lethargic  . Barbiturates Dermatitis and Other (See Comments)    Drowsiness, patient states that Barbiturates & Narcotics make her extremely sleepy and drowsy 24 Apr 2013 Drowsiness  "sleeps forever"   Levester Fresh, PharmD, BCPS, BCCCP Clinical Pharmacist 04/15/2017 5:26 PM

## 2017-04-15 NOTE — Progress Notes (Signed)
[facility]   Reason for visit: Follow up on bacteremia  Interval History: no fever.  MRI noted and no signs of osteomyelitis.  No associated rash, diarrhea.    Physical Exam: Constitutional:  Vitals:   04/14/17 2123 04/15/17 1530  BP: (!) 128/56 130/61  Pulse: (!) 105 96  Resp: 18 18  Temp: 97.5 F (36.4 C) 97.4 F (36.3 C)   patient appears in NAD Respiratory: Normal respiratory effort; CTA B Cardiovascular: RRR GI: soft, nt, nd  Review of Systems: Constitutional: negative for chills Gastrointestinal: negative for nausea  Lab Results  Component Value Date   WBC 4.1 04/14/2017   HGB 9.1 (L) 04/14/2017   HCT 28.8 (L) 04/14/2017   MCV 98.3 04/14/2017   PLT 84 (L) 04/14/2017    Lab Results  Component Value Date   CREATININE 1.23 (H) 04/14/2017   BUN 16 04/14/2017   NA 137 04/14/2017   K 4.2 04/14/2017   CL 107 04/14/2017   CO2 23 04/14/2017    Lab Results  Component Value Date   ALT 20 03/21/2017   AST 41 03/21/2017   ALKPHOS 111 03/21/2017     Microbiology: Recent Results (from the past 240 hour(s))  MRSA PCR Screening     Status: None   Collection Time: 04/08/17  6:00 PM  Result Value Ref Range Status   MRSA by PCR NEGATIVE NEGATIVE Final    Comment:        The GeneXpert MRSA Assay (FDA approved for NASAL specimens only), is one component of a comprehensive MRSA colonization surveillance program. It is not intended to diagnose MRSA infection nor to guide or monitor treatment for MRSA infections.   Culture, blood (routine x 2)     Status: Abnormal   Collection Time: 04/11/17  8:34 AM  Result Value Ref Range Status   Specimen Description BLOOD LEFT ARM  Final   Special Requests   Final    BOTTLES DRAWN AEROBIC ONLY Blood Culture adequate volume   Culture  Setup Time   Final    GRAM POSITIVE COCCI IN CLUSTERS IN PEDIATRIC BOTTLE CRITICAL RESULT CALLED TO, READ BACK BY AND VERIFIED WITH: E MARTIN 04/12/17 @ 52 M  VESTAL    Culture STAPHYLOCOCCUS SPECIES (COAGULASE NEGATIVE) (A)  Final   Report Status 04/14/2017 FINAL  Final   Organism ID, Bacteria STAPHYLOCOCCUS SPECIES (COAGULASE NEGATIVE)  Final      Susceptibility   Staphylococcus species (coagulase negative) - MIC*    CIPROFLOXACIN >=8 RESISTANT Resistant     ERYTHROMYCIN >=8 RESISTANT Resistant     GENTAMICIN <=0.5 SENSITIVE Sensitive     OXACILLIN >=4 RESISTANT Resistant     TETRACYCLINE >=16 RESISTANT Resistant     VANCOMYCIN 1 SENSITIVE Sensitive     TRIMETH/SULFA 160 RESISTANT Resistant     CLINDAMYCIN >=8 RESISTANT Resistant     RIFAMPIN >=32 RESISTANT Resistant     Inducible Clindamycin NEGATIVE Sensitive     * STAPHYLOCOCCUS SPECIES (COAGULASE NEGATIVE)  Blood Culture ID Panel (Reflexed)     Status: Abnormal   Collection Time: 04/11/17  8:34 AM  Result Value Ref Range Status   Enterococcus species NOT DETECTED NOT DETECTED Final   Listeria monocytogenes NOT DETECTED NOT DETECTED Final   Staphylococcus species DETECTED (A) NOT DETECTED Final    Comment: Methicillin (oxacillin) resistant coagulase negative staphylococcus. Possible blood culture contaminant (unless isolated from more than one blood culture draw or clinical case suggests pathogenicity). No antibiotic  treatment is indicated for blood  culture contaminants. CRITICAL RESULT CALLED TO, READ BACK BY AND VERIFIED WITH: E MARTIN 04/12/17 @ 34 M VESTAL    Staphylococcus aureus NOT DETECTED NOT DETECTED Final   Methicillin resistance DETECTED (A) NOT DETECTED Final    Comment: CRITICAL RESULT CALLED TO, READ BACK BY AND VERIFIED WITH: E MARTIN 04/12/17 @ 71 M VESTAL    Streptococcus species NOT DETECTED NOT DETECTED Final   Streptococcus agalactiae NOT DETECTED NOT DETECTED Final   Streptococcus pneumoniae NOT DETECTED NOT DETECTED Final   Streptococcus pyogenes NOT DETECTED NOT DETECTED Final   Acinetobacter baumannii NOT DETECTED NOT DETECTED Final    Enterobacteriaceae species NOT DETECTED NOT DETECTED Final   Enterobacter cloacae complex NOT DETECTED NOT DETECTED Final   Escherichia coli NOT DETECTED NOT DETECTED Final   Klebsiella oxytoca NOT DETECTED NOT DETECTED Final   Klebsiella pneumoniae NOT DETECTED NOT DETECTED Final   Proteus species NOT DETECTED NOT DETECTED Final   Serratia marcescens NOT DETECTED NOT DETECTED Final   Haemophilus influenzae NOT DETECTED NOT DETECTED Final   Neisseria meningitidis NOT DETECTED NOT DETECTED Final   Pseudomonas aeruginosa NOT DETECTED NOT DETECTED Final   Candida albicans NOT DETECTED NOT DETECTED Final   Candida glabrata NOT DETECTED NOT DETECTED Final   Candida krusei NOT DETECTED NOT DETECTED Final   Candida parapsilosis NOT DETECTED NOT DETECTED Final   Candida tropicalis NOT DETECTED NOT DETECTED Final  Culture, blood (routine x 2)     Status: Abnormal   Collection Time: 04/11/17  8:36 AM  Result Value Ref Range Status   Specimen Description BLOOD LEFT ANTECUBITAL  Final   Special Requests   Final    BOTTLES DRAWN AEROBIC ONLY Blood Culture adequate volume   Culture  Setup Time   Final    GRAM POSITIVE COCCI IN CLUSTERS IN PEDIATRIC BOTTLE CRITICAL RESULT CALLED TO, READ BACK BY AND VERIFIED WITH: E MARTIN 04/12/17 @ 84 M VESTAL    Culture (A)  Final    STAPHYLOCOCCUS SPECIES (COAGULASE NEGATIVE) SUSCEPTIBILITIES PERFORMED ON PREVIOUS CULTURE WITHIN THE LAST 5 DAYS.    Report Status 04/14/2017 FINAL  Final  Culture, blood (routine x 2)     Status: None (Preliminary result)   Collection Time: 04/14/17  7:42 AM  Result Value Ref Range Status   Specimen Description BLOOD LEFT HAND  Final   Special Requests IN PEDIATRIC BOTTLE Blood Culture adequate volume  Final   Culture NO GROWTH 1 DAY  Final   Report Status PENDING  Incomplete  Culture, blood (routine x 2)     Status: None (Preliminary result)   Collection Time: 04/14/17  7:47 AM  Result Value Ref Range Status   Specimen  Description BLOOD LEFT HAND  Final   Special Requests IN PEDIATRIC BOTTLE Blood Culture adequate volume  Final   Culture NO GROWTH 1 DAY  Final   Report Status PENDING  Incomplete    Impression/Plan:  1. Line infection - CoNS.  Repeat blood cultures remain negative.  Line out since 6/15.  She will need 5 days IV vancomycin through 6/20.    2. Foot wound - no signs of infection on MRI.  Continue supportive care.    I will sign off, call with questions

## 2017-04-16 ENCOUNTER — Inpatient Hospital Stay (HOSPITAL_COMMUNITY): Payer: Medicare Other

## 2017-04-16 DIAGNOSIS — S72351S Displaced comminuted fracture of shaft of right femur, sequela: Secondary | ICD-10-CM

## 2017-04-16 DIAGNOSIS — D539 Nutritional anemia, unspecified: Secondary | ICD-10-CM

## 2017-04-16 DIAGNOSIS — R7881 Bacteremia: Secondary | ICD-10-CM

## 2017-04-16 LAB — CBC
HCT: 29 % — ABNORMAL LOW (ref 36.0–46.0)
Hemoglobin: 9 g/dL — ABNORMAL LOW (ref 12.0–15.0)
MCH: 30.5 pg (ref 26.0–34.0)
MCHC: 31 g/dL (ref 30.0–36.0)
MCV: 98.3 fL (ref 78.0–100.0)
PLATELETS: 95 10*3/uL — AB (ref 150–400)
RBC: 2.95 MIL/uL — ABNORMAL LOW (ref 3.87–5.11)
RDW: 18 % — AB (ref 11.5–15.5)
WBC: 5.4 10*3/uL (ref 4.0–10.5)

## 2017-04-16 LAB — BASIC METABOLIC PANEL
Anion gap: 7 (ref 5–15)
BUN: 16 mg/dL (ref 6–20)
CHLORIDE: 104 mmol/L (ref 101–111)
CO2: 24 mmol/L (ref 22–32)
CREATININE: 1.24 mg/dL — AB (ref 0.44–1.00)
Calcium: 8.4 mg/dL — ABNORMAL LOW (ref 8.9–10.3)
GFR calc Af Amer: 51 mL/min — ABNORMAL LOW (ref >60)
GFR calc non Af Amer: 44 mL/min — ABNORMAL LOW (ref >60)
Glucose, Bld: 222 mg/dL — ABNORMAL HIGH (ref 65–99)
Potassium: 4 mmol/L (ref 3.5–5.1)
SODIUM: 135 mmol/L (ref 135–145)

## 2017-04-16 LAB — GLUCOSE, CAPILLARY
GLUCOSE-CAPILLARY: 224 mg/dL — AB (ref 65–99)
Glucose-Capillary: 155 mg/dL — ABNORMAL HIGH (ref 65–99)

## 2017-04-16 LAB — VANCOMYCIN, RANDOM: VANCOMYCIN RM: 25

## 2017-04-16 MED ORDER — VANCOMYCIN HCL 10 G IV SOLR: 1250.0000 mg | INTRAVENOUS | Status: DC

## 2017-04-16 MED ORDER — CLOTRIMAZOLE 1 % EX CREA
TOPICAL_CREAM | Freq: Two times a day (BID) | CUTANEOUS | 0 refills | Status: AC
Start: 2017-04-16 — End: ?

## 2017-04-16 MED ORDER — LISINOPRIL 20 MG PO TABS: 20.0000 mg | ORAL_TABLET | Freq: Every day | ORAL | 1 refills | Status: AC

## 2017-04-16 MED ORDER — INSULIN ASPART PROT & ASPART (70-30 MIX) 100 UNIT/ML ~~LOC~~ SUSP: 50.0000 [IU] | SUBCUTANEOUS | 11 refills | Status: AC

## 2017-04-16 MED ORDER — ENOXAPARIN SODIUM 40 MG/0.4ML ~~LOC~~ SOLN: 40.0000 mg | SUBCUTANEOUS | 0 refills | Status: DC

## 2017-04-16 MED ORDER — VANCOMYCIN HCL 500 MG IV SOLR: 1000.0000 mg | Freq: Two times a day (BID) | INTRAVENOUS | 0 refills | Status: DC

## 2017-04-16 MED ORDER — INSULIN ASPART 100 UNIT/ML ~~LOC~~ SOLN: SUBCUTANEOUS | 3 refills | Status: AC

## 2017-04-16 MED ORDER — CLOTRIMAZOLE 1 % EX CREA
TOPICAL_CREAM | Freq: Two times a day (BID) | CUTANEOUS | Status: DC
  Administered 2017-04-16: 15:00:00 via TOPICAL
  Filled 2017-04-16: qty 15

## 2017-04-16 MED ORDER — SODIUM CHLORIDE 0.9 % IV SOLN: 1250.0000 mg | INTRAVENOUS | 0 refills | Status: AC

## 2017-04-16 MED ORDER — TAMSULOSIN HCL 0.4 MG PO CAPS: 0.4000 mg | ORAL_CAPSULE | Freq: Every day | ORAL | 1 refills | Status: AC

## 2017-04-16 MED ORDER — HYDROCODONE-ACETAMINOPHEN 5-325 MG PO TABS: 1.0000 | ORAL_TABLET | Freq: Three times a day (TID) | ORAL | 0 refills | Status: AC | PRN

## 2017-04-16 MED ORDER — VANCOMYCIN IV (FOR PTA / DISCHARGE USE ONLY): 1250.0000 mg | INTRAVENOUS | 0 refills | Status: AC

## 2017-04-16 NOTE — Consult Note (Signed)
[facility] Nurse wound consult note Reason for Consult: sacrum  Wound type: moisture associated skin damage with fungal overgrowth, not related to pressure. Pressure Injury POA: No Measurement: diffuse area of reddened tissue concentrated over the left and right buttocks with satellite lesions Wound bed: see above, not open Drainage (amount, consistency, odor) none Periwound: intact Patient reports area is pruritic also Dressing procedure/placement/frequency: DC use of Gerhardt's, begin antifungal cream.   Discussed POC with patient and bedside nurse.  Re consult if needed, will not follow at this time. Thanks  Demetris Meinhardt [employer], RN,CWOCN, CNS, [place] [phone])

## 2017-04-16 NOTE — Progress Notes (Signed)
Pharmacy Antibiotic Note  Daisy Larson is a 66 y.o. female with recent IM nailing for L-femur fracture in May '18 who presented on 04/08/2017 with fall and hip pain. The patient is now s/p R-femur IM fixation on 04/09/17.  Pharmacy consulted to manage vancomycin for MRSE bacteremia.  MRI negative for osteomyelitis.  Patient's renal function is fluctuating.  Her vancomycin random level decreased to 25 mcg/mL this AM.  Patient specific kinetic (Ke = 0.01683, t1/2 = 41 hrs).  She remains afebrile and her WBC is WNL.    Per ID, treat through 04/18/17.   Plan: - On 04/17/17, resume vanc at 1250mg  IV Q48H - Monitor renal fxn, clinical progress - No repeat vanc trough needed given abx to end on 04/18/17    Height: 5\' 8"  (172.7 cm) Weight: 278 lb 7.1 oz (126.3 kg) IBW/kg (Calculated) : 63.9  Temp (24hrs), Avg:98 F (36.7 C), Min:97.4 F (36.3 C), Max:98.4 F (36.9 C)   Recent Labs Lab 04/11/17 0658 04/12/17 0434 04/13/17 0424 04/14/17 1619 04/15/17 1530 04/16/17 0806  WBC 4.8 3.7* 4.5 4.1  --  5.4  CREATININE 1.20* 0.93  --  1.23* 1.26* 1.24*  VANCOTROUGH  --   --   --   --  61*  --   VANCORANDOM  --   --   --   --   --  25    Estimated Creatinine Clearance: 62.6 mL/min (A) (by C-G formula based on SCr of 1.24 mg/dL (H)).    Allergies  Allergen Reactions  . Other Rash    Blisters Per Patient - she has been told she can not have general anesthesia "unless life-threatening"  . Tape   . Oxymorphone     lethargic  . Barbiturates Dermatitis and Other (See Comments)    Drowsiness, patient states that Barbiturates & Narcotics make her extremely sleepy and drowsy 24 Apr 2013 Drowsiness  "sleeps forever"    Cefazolin pre/post-op 6/11 >> 6/12 Vanc 6/15 >> (6/20)  6/17 VT (1530) = 33 mcg/mL on 1g q12 (SCr 1.23) >> held 6/18 VR (0800) = 25 mcg/mL (ke = 0.01683, t1/2 = 41 hrs) >> resume 6/19 1250mg  q48h  6/13 BCx - 2/2 MRSE 6/16 BCx - NGTD   Jamarius Saha D. Mina Marble, PharmD,  BCPS Pager:  [phone] 04/16/2017, 9:58 AM

## 2017-04-16 NOTE — Social Work (Signed)
Patient returned to hospital after a recent DC for femur fracture to [facility]. While at SNF she fell at facility.  Facility indicated that she can return to facility.  CSW sent over DC summary and will f/u for DC.

## 2017-04-16 NOTE — Discharge Summary (Addendum)
Physician Discharge Summary  Daisy Larson MRN: [id number] DOB/AGE: 12-01-1950 66 y.o.  PCP: [name], MD   Admit date: 04/08/2017 Discharge date: 04/16/2017  Discharge Diagnoses:    Principal Problem:   Closed displaced comminuted fracture of shaft of right femur ([place]) Active Problems:   Liver cirrhosis secondary to NASH ([place])   GERD (gastroesophageal reflux disease)   Sleep apnea   Closed left comminuted femur fracture  s/p intramedullary nailing ([facility])   Charcot's joint of foot, left w/ chronic wounds   Diabetes mellitus   Hypertension   Thrombocytopenia ([facility])   Anemia, macrocytic   Dehydration with hyponatremia   Closed comminuted intertrochanteric fracture of proximal end of right femur ([place])   Bacteremia   Pressure injury of skin    Follow-up recommendations Follow-up with PCP in 3-5 days , including all  additional recommended appointments as below Follow-up CBC, CMP in 3-5 days Continue IV vancomycin through 6/20. Please check trough levels and adjust dosing      Current Discharge Medication List    START taking these medications   Details  insulin aspart (NOVOLOG) 100 UNIT/ML injection Correction coverage: Sensitive (thin, NPO, renal)  CBG < 70: implement hypoglycemia protocol  CBG 70 - 120: 0 units  CBG 121 - 150: 1 unit  CBG 151 - 200: 2 units  CBG 201 - 250: 3 units  CBG 251 - 300: 5 units  CBG 301 - 350: 7 units  CBG 351 - 400 9 units  CBG > 400 call MD and obtain STAT lab verification Qty: 10 mL, Refills: 3    tamsulosin (FLOMAX) 0.4 MG CAPS capsule Take 1 capsule (0.4 mg total) by mouth daily. Qty: 30 capsule, Refills: 1    vancomycin 1,250 mg in sodium chloride 0.9 % 250 mL Inject 1,250 mg into the vein every other day. Qty: 1 ampule, Refills: 0    vancomycin IVPB Inject 1,250 mg into the vein every other day. Indication:  MRSE bacteremia Last Day of Therapy:  04/18/17 Labs - Sunday/Monday:  CBC/D, BMP, and vancomycin trough. Labs  - Thursday:  BMP and vancomycin trough Labs - Every other week:  ESR and CRP Qty: 1 Units, Refills: 0      CONTINUE these medications which have CHANGED   Details  HYDROcodone-acetaminophen (NORCO/VICODIN) 5-325 MG tablet Take 1 tablet by mouth every 8 (eight) hours as needed for moderate pain. Qty: 5 tablet, Refills: 0    insulin aspart protamine- aspart (NOVOLOG MIX 70/30) (70-30) 100 UNIT/ML injection Inject 0.5 mLs (50 Units total) into the skin See admin instructions. Inject 100 units subcutaneously every morning, 70 units at dinner time (6 pm) and 70 units at bedtime only if CBG >200 Qty: 10 mL, Refills: 11    lisinopril (PRINIVIL,ZESTRIL) 20 MG tablet Take 1 tablet (20 mg total) by mouth daily. Take in the morning Qty: 30 tablet, Refills: 1      CONTINUE these medications which have NOT CHANGED   Details  aspirin 81 MG chewable tablet Chew 81 mg by mouth daily.    b complex vitamins capsule Take 1 capsule by mouth daily.     buPROPion (WELLBUTRIN XL) 300 MG 24 hr tablet Take 300 mg by mouth Daily.     Calcium Carb-Cholecalciferol 600-400 MG-UNIT CAPS Take 2 tablets by mouth daily.    Cinnamon 500 MG capsule Take 500 mg by mouth daily.     citalopram (CELEXA) 20 MG tablet Take 20 mg by mouth daily.  DULoxetine (CYMBALTA) 60 MG capsule Take 60 mg by mouth daily.    fluticasone (FLONASE) 50 MCG/ACT nasal spray Place 1 spray into both nostrils daily as needed for allergies.     furosemide (LASIX) 40 MG tablet Take 60 mg by mouth daily.     gabapentin (NEURONTIN) 600 MG tablet Take 600 mg by mouth 3 (three) times daily.    glucagon (GLUCAGON EMERGENCY) 1 MG injection Inject 1 mg into the vein once as needed. Qty: 1 each, Refills: 1    lactulose (CHRONULAC) 10 GM/15ML solution Take 75 g by mouth 3 (three) times daily. 112 mls - 75 gm    loratadine (CLARITIN) 10 MG tablet Take 10 mg by mouth daily.    Magnesium Oxide 400 MG CAPS Take 400 mg by mouth daily.      meclizine (ANTIVERT) 12.5 MG tablet Take 12.5 mg by mouth 3 (three) times daily as needed for dizziness.     Milk Thistle 1000 MG CAPS Take 1,000 mg by mouth daily.    Multiple Vitamin (MULTIVITAMIN WITH MINERALS) TABS tablet Take 1 tablet by mouth daily.    nitroGLYCERIN (NITROGLYN) 2 % ointment Apply topically See admin instructions. Apply a pea size amount to the left foot over post tibial artery four times a day behind ankle medial side for increased blood flow.    Omega-3 Fatty Acids (FISH OIL OMEGA-3 PO) Take 1 capsule by mouth daily.    pantoprazole (PROTONIX) 40 MG tablet Take 40 mg by mouth at bedtime.     polyethylene glycol (MIRALAX / GLYCOLAX) packet Take 17 g by mouth 3 (three) times daily as needed for mild constipation. Mix in 8 oz liquid and drink    pramipexole (MIRAPEX) 1 MG tablet Take 2 mg by mouth at bedtime.     propranolol (INDERAL LA) 60 MG 24 hr capsule Take 60 mg by mouth at bedtime.     rifaximin (XIFAXAN) 550 MG TABS tablet Take 550 mg by mouth See admin instructions. Take 1 tablet (550 mg) by mouth twice daily for 30 days - start date 03/21/17, stop date 04/19/17    spironolactone (ALDACTONE) 50 MG tablet Take 75 mg by mouth daily.     AMBULATORY NON FORMULARY MEDICATION Medication Name: Nitroglycerin ointment 0.125% Apply pea-size amount internally 4 times daily Qty: 60 g, Refills: 0    BD INSULIN SYRINGE ULTRAFINE 31G X 5/16" 0.3 ML MISC USE  TO INJECT  INSULIN THREE TIMES DAILY Qty: 300 each, Refills: 1    glucose blood test strip Use as instructed to check blood sugar 4 times per day Dx: E11.65 Qty: 400 each, Refills: 1   Associated Diagnoses: Uncontrolled type 2 diabetes mellitus with ketoacidosis without coma, unspecified long term insulin use status    ONETOUCH DELICA LANCETS 51O MISC Use to test sugars 4 x daily. Qty: 100 each, Refills: 11      STOP taking these medications     enoxaparin (LOVENOX) 40 MG/0.4ML injection      traMADol  (ULTRAM) 50 MG tablet          Discharge Condition: Stable   Discharge Instructions Get Medicines reviewed and adjusted: Please take all your medications with you for your next visit with your Primary MD  Please request your Primary MD to go over all hospital tests and procedure/radiological results at the follow up, please ask your Primary MD to get all Hospital records sent to his/her office.  If you experience worsening of your admission symptoms, develop  shortness of breath, life threatening emergency, suicidal or homicidal thoughts you must seek medical attention immediately by calling 911 or calling your MD immediately if symptoms less severe.  You must read complete instructions/literature along with all the possible adverse reactions/side effects for all the Medicines you take and that have been prescribed to you. Take any new Medicines after you have completely understood and accpet all the possible adverse reactions/side effects.   Do not drive when taking Pain medications.   Do not take more than prescribed Pain, Sleep and Anxiety Medications  Special Instructions: If you have smoked or chewed Tobacco in the last 2 yrs please stop smoking, stop any regular Alcohol and or any Recreational drug use.  Wear Seat belts while driving.  Please note  You were cared for by a hospitalist during your hospital stay. Once you are discharged, your primary care physician will handle any further medical issues. Please note that NO REFILLS for any discharge medications will be authorized once you are discharged, as it is imperative that you return to your primary care physician (or establish a relationship with a primary care physician if you do not have one) for your aftercare needs so that they can reassess your need for medications and monitor your lab values.     Allergies  Allergen Reactions  . Other Rash    Blisters Per Patient - she has been told she can not have general  anesthesia "unless life-threatening"  . Tape   . Oxymorphone     lethargic  . Barbiturates Dermatitis and Other (See Comments)    Drowsiness, patient states that Barbiturates & Narcotics make her extremely sleepy and drowsy 24 Apr 2013 Drowsiness  "sleeps forever"      Disposition: 03-Skilled Nursing Facility   Consults: Infectious disease     Significant Diagnostic Studies:  Dg Chest 1 View  Result Date: 04/08/2017 CLINICAL DATA:  Preoperative examination prior to right hip surgery. EXAM: CHEST 1 VIEW COMPARISON:  03/16/2017 FINDINGS: Cardiomegaly noted. There is no evidence of focal airspace disease, pulmonary edema, suspicious pulmonary nodule/mass, pleural effusion, or pneumothorax. No acute bony abnormalities are identified. Remote left rib fractures again noted. IMPRESSION: Cardiomegaly without evidence of acute cardiopulmonary disease. Electronically Signed   By: Margarette Canada M.D.   On: 04/08/2017 15:43   Pelvis Portable  Result Date: 04/09/2017 CLINICAL DATA:  Postop EXAM: PORTABLE PELVIS 1-2 VIEWS COMPARISON:  04/08/2017, 03/18/2017 FINDINGS: Partially visualized lumbar spine hardware. SI joints are symmetric. Pubic symphysis is intact. Stable left femoral rod across left trochanteric fracture with displaced lesser trochanteric fracture fragment as before. Interval intramedullary rodding of the right intertrochanteric fracture, also with medially displaced lesser trochanteric fracture fragment. Gas in the soft tissues consistent with recent surgical status. IMPRESSION: Interval intramedullary rod fixation of right intertrochanteric fracture. Electronically Signed   By: Donavan Foil M.D.   On: 04/09/2017 20:00   Pelvis Portable  Result Date: 03/18/2017 CLINICAL DATA:  Internal fixation left hip fracture EXAM: PORTABLE PELVIS 1-2 VIEWS COMPARISON:  03/16/2017 FINDINGS: Changes of internal fixation across the left femoral intertrochanteric fracture. Moderate displacement of the  lesser trochanter noted. Otherwise near anatomic alignment. No hardware complicating features. IMPRESSION: Internal fixation across the left femoral intertrochanteric fracture with displaced lesser trochanter. Electronically Signed   By: Rolm Baptise M.D.   On: 03/18/2017 11:27   Mr Foot Left W Contrast  Result Date: 04/15/2017 CLINICAL DATA:  Bacteremia. Retained hardware. Remote subtalar fusion. EXAM: MRI OF THE LEFT FOREFOOT WITH CONTRAST  TECHNIQUE: Multiplanar, multisequence MR imaging of the left foot was performed following the administration of intravenous contrast. CONTRAST:  65m MULTIHANCE GADOBENATE DIMEGLUMINE 529 MG/ML IV SOLN COMPARISON:  Radiographs 04/13/2017 FINDINGS: Postsurgical changes related to a remote attempted subtalar fusion. No bony fusion changes are identified. There is extensive bony fragmentation, probable chronic enhancing granulation tissue and synovitis. No evidence of osteomyelitis or soft tissue abscess. No cellulitis or myofasciitis. Chronic distal Achilles tendinopathy with calcifications. The plantar fascia is intact. There is diffuse fatty atrophy of the ankle and foot muscles. IMPRESSION: Remote postsurgical changes from attempted subtalar fusion. No solid fusion is identified. There is extensive bony fragmentation and chronic enhancing granulation tissue and synovitis. Possible underlying erosive arthropathy. No findings to suggest septic arthritis or osteomyelitis. Electronically Signed   By: PMarijo SanesM.D.   On: 04/15/2017 13:46   Dg Chest Port 1 View  Result Date: 04/11/2017 CLINICAL DATA:  Fevers EXAM: PORTABLE CHEST 1 VIEW COMPARISON:  04/09/2017 FINDINGS: Cardiac shadow is again enlarged. Left jugular central line is noted in the innominate vein. The lungs are well aerated bilaterally with evidence of central vascular congestion. No focal infiltrate or significant pulmonary edema is noted. Mild left basilar atelectasis is seen. IMPRESSION: Mild vascular  congestion and left basilar atelectasis. No significant edema is noted. Electronically Signed   By: MInez CatalinaM.D.   On: 04/11/2017 08:06   Dg Chest Port 1 View  Result Date: 04/09/2017 CLINICAL DATA:  Central line placement. EXAM: PORTABLE CHEST 1 VIEW COMPARISON:  Chest x-ray of 04/08/2017. FINDINGS: Left IJ central line now in place with tip at the upper margin of the SVC. Cardiomediastinal silhouette is stable. Given the low lung volumes, lungs remain clear. No pleural effusion or pneumothorax seen. IMPRESSION: Left IJ central line in place with tip at the level of the upper SVC. No pneumothorax. Stable cardiomegaly. Electronically Signed   By: SFranki CabotM.D.   On: 04/09/2017 20:05   Dg Foot Complete Left  Result Date: 04/13/2017 CLINICAL DATA:  Assess for osteomyelitis at the left ankle, given underlying bacteremia. Retained hardware. Initial encounter. EXAM: LEFT FOOT - COMPLETE 3+ VIEW COMPARISON:  Left foot radiographs performed 03/16/2017 FINDINGS: No new osseous erosions are seen to suggest significant acute osteomyelitis, though evaluation for osteomyelitis is limited on radiograph. There is minimal chronic loosening about the patient's tibiocalcaneal screws. There is chronic collapse of the talus and calcaneus, with marked degeneration about the midfoot. This is concerning for Charcot joint. Calcification is noted at the distal Achilles tendon. Soft tissue swelling is noted about the hindfoot and midfoot. There is a chronic incompletely healed fracture through the proximal aspect of the first distal phalanx. IMPRESSION: 1. No new osseous erosion seen to suggest significant acute osteomyelitis, though evaluation for osteomyelitis is limited on radiograph. If there is significant clinical concern for osteomyelitis, MRI would be helpful for further evaluation. 2. Underlying chronic collapse of the talus and calcaneus, with marked degeneration about the midfoot, concerning for Charcot joint.  Minimal chronic loosening again noted about the patient's tibiocalcaneal screws. 3. Chronic incompletely healed fracture through the proximal aspect of the first distal phalanx. Electronically Signed   By: JGarald BaldingM.D.   On: 04/13/2017 22:18   Dg C-arm 61-120 Min  Result Date: 04/09/2017 CLINICAL DATA:  66year old female with right femur fracture. Subsequent encounter. EXAM: RIGHT FEMUR 2 VIEWS; DG C-ARM 61-120 MIN COMPARISON:  04/08/2017. FINDINGS: Four intraoperative C-arm views of the right femur submitted for review after  surgery. Placement of right femoral intramedullary rod with proximal sliding type screw and distal fixation screw for treatment of right intertrochanteric fracture. No complication noted on C-arm imaging. This can be assessed on follow-up. IMPRESSION: Open reduction and internal fixation of right intertrochanteric fracture. Electronically Signed   By: Genia Del M.D.   On: 04/09/2017 18:21   Dg C-arm 61-120 Min-no Report  Result Date: 03/18/2017 CLINICAL DATA:  ORIF left hip fracture EXAM: LEFT FEMUR 2 VIEWS; DG C-ARM 61-120 MIN-NO REPORT COMPARISON:  03/16/2017 FINDINGS: Internal fixation across the left femoral intertrochanteric fracture. Anatomic alignment. No hardware complicating feature. IMPRESSION: Internal fixation across the left intertrochanteric fracture. Electronically Signed   By: Rolm Baptise M.D.   On: 03/18/2017 09:57   Dg Hip Unilat W Or Wo Pelvis 2-3 Views Right  Result Date: 04/08/2017 CLINICAL DATA:  Acute right hip pain following fall. EXAM: DG HIP (WITH OR WITHOUT PELVIS) 2-3V RIGHT COMPARISON:  03/18/2017 FINDINGS: An acute comminuted intertrochanteric right femur fracture is noted with lesser trochanteric fragment displaced by 6 mm. There is no evidence of subluxation or dislocation. Internal fixation of a left intertrochanteric femur fracture is again noted. IMPRESSION: Acute comminuted intratrochanteric right femur fracture. Electronically Signed    By: Margarette Canada M.D.   On: 04/08/2017 15:34   Dg Femur Min 2 Views Left  Result Date: 03/18/2017 CLINICAL DATA:  ORIF left hip fracture EXAM: LEFT FEMUR 2 VIEWS; DG C-ARM 61-120 MIN-NO REPORT COMPARISON:  03/16/2017 FINDINGS: Internal fixation across the left femoral intertrochanteric fracture. Anatomic alignment. No hardware complicating feature. IMPRESSION: Internal fixation across the left intertrochanteric fracture. Electronically Signed   By: Rolm Baptise M.D.   On: 03/18/2017 09:57   Dg Femur, Min 2 Views Right  Result Date: 04/09/2017 CLINICAL DATA:  66 year old female with right femur fracture. Subsequent encounter. EXAM: RIGHT FEMUR 2 VIEWS; DG C-ARM 61-120 MIN COMPARISON:  04/08/2017. FINDINGS: Four intraoperative C-arm views of the right femur submitted for review after surgery. Placement of right femoral intramedullary rod with proximal sliding type screw and distal fixation screw for treatment of right intertrochanteric fracture. No complication noted on C-arm imaging. This can be assessed on follow-up. IMPRESSION: Open reduction and internal fixation of right intertrochanteric fracture. Electronically Signed   By: Genia Del M.D.   On: 04/09/2017 18:21   Dg Femur Port Min 2 Views Left  Result Date: 03/18/2017 CLINICAL DATA:  Internal fixation EXAM: LEFT FEMUR PORTABLE 2 VIEWS COMPARISON:  03/16/2017 FINDINGS: Intramedullary nail and dynamic hip screw noted across the right femoral intertrochanteric fracture. Displacement of the lesser trochanter since. Otherwise near anatomic alignment. No hardware complicating feature. IMPRESSION: Internal fixation across the left femoral intertrochanteric fracture with displaced lesser trochanter. Electronically Signed   By: Rolm Baptise M.D.   On: 03/18/2017 11:27   Dg Femur Port, Min 2 Views Right  Result Date: 04/09/2017 CLINICAL DATA:  Postop EXAM: RIGHT FEMUR PORTABLE 2 VIEW COMPARISON:  04/08/2017 FINDINGS: Intramedullary rod fixation of  right intertrochanteric fracture with displaced lesser trochanteric fracture fragment. The rod is fixated distally by a single screw. Hardware appears intact. Gas in the lateral soft tissues consistent with recent surgery IMPRESSION: Intramedullary rod and screw fixation of the right femur for intertrochanteric fracture. Expected postsurgical changes. Electronically Signed   By: Donavan Foil M.D.   On: 04/09/2017 20:02        Filed Weights   04/08/17 1733 04/09/17 2220  Weight: 122.3 kg (269 lb 10 oz) 126.3 kg (278 lb 7.1  oz)     Microbiology: Recent Results (from the past 240 hour(s))  MRSA PCR Screening     Status: None   Collection Time: 04/08/17  6:00 PM  Result Value Ref Range Status   MRSA by PCR NEGATIVE NEGATIVE Final    Comment:        The GeneXpert MRSA Assay (FDA approved for NASAL specimens only), is one component of a comprehensive MRSA colonization surveillance program. It is not intended to diagnose MRSA infection nor to guide or monitor treatment for MRSA infections.   Culture, blood (routine x 2)     Status: Abnormal   Collection Time: 04/11/17  8:34 AM  Result Value Ref Range Status   Specimen Description BLOOD LEFT ARM  Final   Special Requests   Final    BOTTLES DRAWN AEROBIC ONLY Blood Culture adequate volume   Culture  Setup Time   Final    GRAM POSITIVE COCCI IN CLUSTERS IN PEDIATRIC BOTTLE CRITICAL RESULT CALLED TO, READ BACK BY AND VERIFIED WITH: E MARTIN 04/12/17 @ 19 M VESTAL    Culture STAPHYLOCOCCUS SPECIES (COAGULASE NEGATIVE) (A)  Final   Report Status 04/14/2017 FINAL  Final   Organism ID, Bacteria STAPHYLOCOCCUS SPECIES (COAGULASE NEGATIVE)  Final      Susceptibility   Staphylococcus species (coagulase negative) - MIC*    CIPROFLOXACIN >=8 RESISTANT Resistant     ERYTHROMYCIN >=8 RESISTANT Resistant     GENTAMICIN <=0.5 SENSITIVE Sensitive     OXACILLIN >=4 RESISTANT Resistant     TETRACYCLINE >=16 RESISTANT Resistant     VANCOMYCIN  1 SENSITIVE Sensitive     TRIMETH/SULFA 160 RESISTANT Resistant     CLINDAMYCIN >=8 RESISTANT Resistant     RIFAMPIN >=32 RESISTANT Resistant     Inducible Clindamycin NEGATIVE Sensitive     * STAPHYLOCOCCUS SPECIES (COAGULASE NEGATIVE)  Blood Culture ID Panel (Reflexed)     Status: Abnormal   Collection Time: 04/11/17  8:34 AM  Result Value Ref Range Status   Enterococcus species NOT DETECTED NOT DETECTED Final   Listeria monocytogenes NOT DETECTED NOT DETECTED Final   Staphylococcus species DETECTED (A) NOT DETECTED Final    Comment: Methicillin (oxacillin) resistant coagulase negative staphylococcus. Possible blood culture contaminant (unless isolated from more than one blood culture draw or clinical case suggests pathogenicity). No antibiotic treatment is indicated for blood  culture contaminants. CRITICAL RESULT CALLED TO, READ BACK BY AND VERIFIED WITH: E MARTIN 04/12/17 @ 56 M VESTAL    Staphylococcus aureus NOT DETECTED NOT DETECTED Final   Methicillin resistance DETECTED (A) NOT DETECTED Final    Comment: CRITICAL RESULT CALLED TO, READ BACK BY AND VERIFIED WITH: E MARTIN 04/12/17 @ 48 M VESTAL    Streptococcus species NOT DETECTED NOT DETECTED Final   Streptococcus agalactiae NOT DETECTED NOT DETECTED Final   Streptococcus pneumoniae NOT DETECTED NOT DETECTED Final   Streptococcus pyogenes NOT DETECTED NOT DETECTED Final   Acinetobacter baumannii NOT DETECTED NOT DETECTED Final   Enterobacteriaceae species NOT DETECTED NOT DETECTED Final   Enterobacter cloacae complex NOT DETECTED NOT DETECTED Final   Escherichia coli NOT DETECTED NOT DETECTED Final   Klebsiella oxytoca NOT DETECTED NOT DETECTED Final   Klebsiella pneumoniae NOT DETECTED NOT DETECTED Final   Proteus species NOT DETECTED NOT DETECTED Final   Serratia marcescens NOT DETECTED NOT DETECTED Final   Haemophilus influenzae NOT DETECTED NOT DETECTED Final   Neisseria meningitidis NOT DETECTED NOT DETECTED Final    Pseudomonas aeruginosa NOT DETECTED  NOT DETECTED Final   Candida albicans NOT DETECTED NOT DETECTED Final   Candida glabrata NOT DETECTED NOT DETECTED Final   Candida krusei NOT DETECTED NOT DETECTED Final   Candida parapsilosis NOT DETECTED NOT DETECTED Final   Candida tropicalis NOT DETECTED NOT DETECTED Final  Culture, blood (routine x 2)     Status: Abnormal   Collection Time: 04/11/17  8:36 AM  Result Value Ref Range Status   Specimen Description BLOOD LEFT ANTECUBITAL  Final   Special Requests   Final    BOTTLES DRAWN AEROBIC ONLY Blood Culture adequate volume   Culture  Setup Time   Final    GRAM POSITIVE COCCI IN CLUSTERS IN PEDIATRIC BOTTLE CRITICAL RESULT CALLED TO, READ BACK BY AND VERIFIED WITH: E MARTIN 04/12/17 @ 55 M VESTAL    Culture (A)  Final    STAPHYLOCOCCUS SPECIES (COAGULASE NEGATIVE) SUSCEPTIBILITIES PERFORMED ON PREVIOUS CULTURE WITHIN THE LAST 5 DAYS.    Report Status 04/14/2017 FINAL  Final  Culture, blood (routine x 2)     Status: None (Preliminary result)   Collection Time: 04/14/17  7:42 AM  Result Value Ref Range Status   Specimen Description BLOOD LEFT HAND  Final   Special Requests IN PEDIATRIC BOTTLE Blood Culture adequate volume  Final   Culture NO GROWTH 1 DAY  Final   Report Status PENDING  Incomplete  Culture, blood (routine x 2)     Status: None (Preliminary result)   Collection Time: 04/14/17  7:47 AM  Result Value Ref Range Status   Specimen Description BLOOD LEFT HAND  Final   Special Requests IN PEDIATRIC BOTTLE Blood Culture adequate volume  Final   Culture NO GROWTH 1 DAY  Final   Report Status PENDING  Incomplete       Blood Culture    Component Value Date/Time   SDES BLOOD LEFT HAND 04/14/2017 0747   SPECREQUEST IN PEDIATRIC BOTTLE Blood Culture adequate volume 04/14/2017 0747   CULT NO GROWTH 1 DAY 04/14/2017 0747   REPTSTATUS PENDING 04/14/2017 0747      Labs: Results for orders placed or performed during the  hospital encounter of 04/08/17 (from the past 48 hour(s))  Glucose, capillary     Status: Abnormal   Collection Time: 04/14/17 11:34 AM  Result Value Ref Range   Glucose-Capillary 225 (H) 65 - 99 mg/dL  Glucose, capillary     Status: Abnormal   Collection Time: 04/14/17  4:08 PM  Result Value Ref Range   Glucose-Capillary 241 (H) 65 - 99 mg/dL  Basic metabolic panel     Status: Abnormal   Collection Time: 04/14/17  4:19 PM  Result Value Ref Range   Sodium 137 135 - 145 mmol/L   Potassium 4.2 3.5 - 5.1 mmol/L   Chloride 107 101 - 111 mmol/L   CO2 23 22 - 32 mmol/L   Glucose, Bld 250 (H) 65 - 99 mg/dL   BUN 16 6 - 20 mg/dL   Creatinine, Ser 1.23 (H) 0.44 - 1.00 mg/dL   Calcium 8.4 (L) 8.9 - 10.3 mg/dL   GFR calc non Af Amer 45 (L) >60 mL/min   GFR calc Af Amer 52 (L) >60 mL/min    Comment: (NOTE) The eGFR has been calculated using the CKD EPI equation. This calculation has not been validated in all clinical situations. eGFR's persistently <60 mL/min signify possible Chronic Kidney Disease.    Anion gap 7 5 - 15  CBC     Status:  Abnormal   Collection Time: 04/14/17  4:19 PM  Result Value Ref Range   WBC 4.1 4.0 - 10.5 K/uL   RBC 2.93 (L) 3.87 - 5.11 MIL/uL   Hemoglobin 9.1 (L) 12.0 - 15.0 g/dL   HCT 28.8 (L) 36.0 - 46.0 %   MCV 98.3 78.0 - 100.0 fL   MCH 31.1 26.0 - 34.0 pg   MCHC 31.6 30.0 - 36.0 g/dL   RDW 17.0 (H) 11.5 - 15.5 %   Platelets 84 (L) 150 - 400 K/uL    Comment: CONSISTENT WITH PREVIOUS RESULT  Glucose, capillary     Status: Abnormal   Collection Time: 04/14/17  9:23 PM  Result Value Ref Range   Glucose-Capillary 215 (H) 65 - 99 mg/dL  Glucose, capillary     Status: Abnormal   Collection Time: 04/15/17  6:10 AM  Result Value Ref Range   Glucose-Capillary 153 (H) 65 - 99 mg/dL  Glucose, capillary     Status: Abnormal   Collection Time: 04/15/17  1:14 PM  Result Value Ref Range   Glucose-Capillary 245 (H) 65 - 99 mg/dL  Sedimentation rate     Status:  Abnormal   Collection Time: 04/15/17  3:30 PM  Result Value Ref Range   Sed Rate 61 (H) 0 - 22 mm/hr  C-reactive protein     Status: Abnormal   Collection Time: 04/15/17  3:30 PM  Result Value Ref Range   CRP 5.5 (H) <1.0 mg/dL  Vancomycin, trough     Status: Abnormal   Collection Time: 04/15/17  3:30 PM  Result Value Ref Range   Vancomycin Tr 33 (HH) 15 - 20 ug/mL    Comment: CRITICAL RESULT CALLED TO, READ BACK BY AND VERIFIED WITH: RN [facility] AT [id number] MARTINB   Basic metabolic panel     Status: Abnormal   Collection Time: 04/15/17  3:30 PM  Result Value Ref Range   Sodium 135 135 - 145 mmol/L   Potassium 3.9 3.5 - 5.1 mmol/L   Chloride 106 101 - 111 mmol/L   CO2 23 22 - 32 mmol/L   Glucose, Bld 283 (H) 65 - 99 mg/dL   BUN 16 6 - 20 mg/dL   Creatinine, Ser 1.26 (H) 0.44 - 1.00 mg/dL   Calcium 8.3 (L) 8.9 - 10.3 mg/dL   GFR calc non Af Amer 43 (L) >60 mL/min   GFR calc Af Amer 50 (L) >60 mL/min    Comment: (NOTE) The eGFR has been calculated using the CKD EPI equation. This calculation has not been validated in all clinical situations. eGFR's persistently <60 mL/min signify possible Chronic Kidney Disease.    Anion gap 6 5 - 15  Glucose, capillary     Status: Abnormal   Collection Time: 04/15/17  4:52 PM  Result Value Ref Range   Glucose-Capillary 254 (H) 65 - 99 mg/dL  Glucose, capillary     Status: Abnormal   Collection Time: 04/15/17  9:10 PM  Result Value Ref Range   Glucose-Capillary 181 (H) 65 - 99 mg/dL  Glucose, capillary     Status: Abnormal   Collection Time: 04/16/17  5:21 AM  Result Value Ref Range   Glucose-Capillary 155 (H) 65 - 99 mg/dL  Basic metabolic panel     Status: Abnormal   Collection Time: 04/16/17  8:06 AM  Result Value Ref Range   Sodium 135 135 - 145 mmol/L   Potassium 4.0 3.5 - 5.1 mmol/L   Chloride 104 101 -  111 mmol/L   CO2 24 22 - 32 mmol/L   Glucose, Bld 222 (H) 65 - 99 mg/dL   BUN 16 6 - 20 mg/dL   Creatinine, Ser  1.24 (H) 0.44 - 1.00 mg/dL   Calcium 8.4 (L) 8.9 - 10.3 mg/dL   GFR calc non Af Amer 44 (L) >60 mL/min   GFR calc Af Amer 51 (L) >60 mL/min    Comment: (NOTE) The eGFR has been calculated using the CKD EPI equation. This calculation has not been validated in all clinical situations. eGFR's persistently <60 mL/min signify possible Chronic Kidney Disease.    Anion gap 7 5 - 15  CBC     Status: Abnormal   Collection Time: 04/16/17  8:06 AM  Result Value Ref Range   WBC 5.4 4.0 - 10.5 K/uL   RBC 2.95 (L) 3.87 - 5.11 MIL/uL   Hemoglobin 9.0 (L) 12.0 - 15.0 g/dL   HCT 29.0 (L) 36.0 - 46.0 %   MCV 98.3 78.0 - 100.0 fL   MCH 30.5 26.0 - 34.0 pg   MCHC 31.0 30.0 - 36.0 g/dL   RDW 18.0 (H) 11.5 - 15.5 %   Platelets 95 (L) 150 - 400 K/uL    Comment: CONSISTENT WITH PREVIOUS RESULT  Vancomycin, random     Status: None   Collection Time: 04/16/17  8:06 AM  Result Value Ref Range   Vancomycin Rm 25     Comment:        Random Vancomycin therapeutic range is dependent on dosage and time of specimen collection. A peak range is 20.0-40.0 ug/mL A trough range is 5.0-15.0 ug/mL             Lipid Panel     Component Value Date/Time   CHOL 148 09/14/2015 0808   TRIG 63.0 09/14/2015 0808   TRIG 70 09/14/2010 0000   HDL 41.50 09/14/2015 0808   CHOLHDL 4 09/14/2015 0808   VLDL 12.6 09/14/2015 0808   LDLCALC 94 09/14/2015 0808     Lab Results  Component Value Date   HGBA1C 6.6 (H) 03/17/2017   HGBA1C 6.5 06/10/2015   HGBA1C 6.7 (H) 02/18/2015     Lab Results  Component Value Date   MICROALBUR 2.1 (H) 09/14/2015   LDLCALC 94 09/14/2015   CREATININE 1.24 (H) 04/16/2017     HPI :  66 y.o.femalewith medical history significant for diabetes mellitus on high-dose insulin, obesity, sleep apnea on nocturnal CPAP, left Charcot foot with chronic wounds, macrocytic anemia, NASHcirrhosis with history of hepatic encephalopathy on rifaximin an and lactulose, hypertension, reflux and  recent admission for surgical repair of left comminuted femur fracture after mechanical fall. She was at the nursing facility when she got out of bed unassisted to obtain a [place] bookand fell. Imaging obtained in the ER reveals comminuted fracture of the right femur. Patient underwent right intramedullary fixation 6/11.   Postoperative course complicated by hypotension, required 2u pRBC, then had few days of encephalopathy likely in combination of pain medications, anesthesia, and hepatic encephalopathy. Altered mental status resolved. She had a fever 100.4, and blood cultures were obtained. This showed 2 of 2 coag negative staph. She was started on vancomycin, infectious disease consulted.  HOSPITAL COURSE:   Closed displaced comminuted fracture of shaft of right femur  -Presents with mechanical fall after unassisted ambulation resulting in femur fracture -Underwent right intramedullary fixation on 6/11 with Dr. [name]. Had hypotension in PACU post operatively, received 2u pRBC . Hemoglobin now stable  around 9-9.1 -PT OT recommending SNF   Acute encephalopathy, likely combination of hepatic encephalopathy, meds -Patient has history of postoperative encephalopathy in the past -Continue rifaximin, lactulose for goal of 3 bowel movements daily -Limit narcotics  -Resolved   Sepsis secondary to coag neg staph bacteremia  -Blood cultures with 2 of 2 coag neg staph, central line induced, not present at time of admission -Central line removed  6/15 -MRI left foot negative, PICC for vanco through 6/20 -Repeat culture negative so far    CKD stage 3 -Baseline Cr 1.1-1.3 -Stable, trend BMP   Liver cirrhosis secondary to NASH, with hx hepatic encephalopathy  -Lasix and spironolactone -Continue rifaximin, lactulose  OSA -Continue nocturnal CPAP   Diabetes mellitus -Hemoglobin A1c was 6.6 on 5/19 -Lantus + Novolog SSI. Dose adjusted   -DM coordinator consult    Hypertension -Lasix and spironolactone  Resume lisinopril and propranolol in the next 1-2 days  GERD  -Continue Protonix  Charcot's joint of foot, left w/ chronic wounds -Chronic issue followed at [facility] -Presumed hardware infection and has had intra-articular antibody beads placed -Prior to last admission family was using Betadine for wound care at home -She is to follow-up with her primary surgeon Dr. [name] at [facility] in [place]  Thrombocytopenia  -Was on Lovenox for DVT prophylaxis at nursing facility, now discontinued due to thrombocytopenia Last platelet count 95  Anemia, macrocytic -Hemoglobin stable and at baseline around 8-9 -Had hypotension in PACU post operatively, received 2u pRBC   Hemoglobin now stable  Restless leg syndrome -Continue Mirapex  Depression -Continue wellbutrin, cymbalta   Acute urinary retention -Straight cath prn x 3, now foley placed. She has had this issue of postoperative urinary retention in October 2017 and had seen a urologist at [facility]. Records reviewed. She was started on Flomax at that time.    Sacral pressure ulcer stage 2, not POA -Wound RN consulted   Discharge Exam:   Blood pressure (!) 133/53, pulse 98, temperature 98.1 F (36.7 C), temperature source Oral, resp. rate 18, height _0  (1.727 m), weight 126.3 kg (278 lb 7.1 oz), SpO2 98 %.  Cardiovascular system: S1 & S2 heard, RRR. No JVD, murmurs, rubs, gallops or clicks. No pedal edema. Gastrointestinal system: Abdomen is nondistended, soft and nontender. No organomegaly or masses felt. Normal bowel sounds heard. Central nervous system: Alert and oriented, non focal exam, conversational  Extremities: +1 edema  Skin: Surgical site dressing clean and dry on right hip, left heel with medial wound without drainage, sacral ulcer stage 2 noted with some bleeding     Follow-up Information    Swinteck, Aaron Edelman, MD. Schedule an  appointment as soon as possible for a visit in 2 weeks.   Specialty:  Orthopedic Surgery Why:  For wound re-check Contact information: [address] [place] [address] [phone]           Signed: Reyne Dumas 04/16/2017, 9:59 AM        Time spent >1 hour

## 2017-04-16 NOTE — Progress Notes (Signed)
Occupational Therapy Treatment Patient Details Name: Daisy Larson MRN: [id number] DOB: [birth year] Today's Date: 04/16/2017    History of present illness Pt adm with rt femur fx after fall at SNF. Underwent ORIF on 04/09/17. Pt with recent (3wks ago) rt femur fx with ORIF. PMH - malignant neoplasm of upper-outer quadrant of right breast in female, Charcot foot and diabetic arthropathy status post several surgical interventions, complicated by intra-articular infection status post intra-articular bead placement about 3 weeks ago, nonalcoholic cirrhosis, lt femur fx 03/18/17   OT comments  Pt making progress with functional goals. Pt able to complete bed mobility with max A to sit EOB for ADL and grooming tasks. Acute OT will continue to follow  Follow Up Recommendations  SNF;Supervision/Assistance - 24 hour    Equipment Recommendations  Other (comment) (TBD)    Recommendations for Other Services      Precautions / Restrictions Precautions Precautions: Fall Required Braces or Orthoses: Other Brace/Splint Other Brace/Splint: pt requires diabetic shoe and "special" shoe for L foot s/p antibiotic beads  Restrictions Weight Bearing Restrictions: Yes RLE Weight Bearing: Partial weight bearing RLE Partial Weight Bearing Percentage or Pounds: 30 LLE Weight Bearing: Weight bearing as tolerated Other Position/Activity Restrictions: Per ortho progress note       Mobility Bed Mobility Overal bed mobility: Needs Assistance Bed Mobility: Rolling Rolling: Max assist   Supine to sit: HOB elevated;Max assist Sit to supine: Max assist;Total assist   General bed mobility comments: Pt assist by reaching with UE for rail to roll. Assist required to move trunk, hips, and LES.   Transfers Overall transfer level: Needs assistance               General transfer comment: Used Maximove to transfer pt from bed to recliner    Balance Overall balance assessment: Needs  assistance Sitting-balance support: Feet supported;Single extremity supported;No upper extremity supported Sitting balance-Leahy Scale: Poor Sitting balance - Comments: Able to sit on EOB for ADL tasks                                   ADL either performed or assessed with clinical judgement   ADL Overall ADL's : Needs assistance/impaired     Grooming: Minimal assistance;Wash/dry hands;Wash/dry face;Sitting   Upper Body Bathing: Minimal assistance;Sitting (simulated)       Upper Body Dressing : Minimal assistance;Sitting                     General ADL Comments: pt seated EOB for ADL tasks     Vision Baseline Vision/History: No visual deficits Patient Visual Report: No change from baseline                Cognition Arousal/Alertness: Awake/alert Behavior During Therapy: WFL for tasks assessed/performed Overall Cognitive Status: Within Functional Limits for tasks assessed                         Following Commands: Follows one step commands with increased time     Problem Solving: Requires verbal cues;Slow processing;Requires tactile cues;Decreased initiation;Difficulty sequencing                     General Comments  pt very pleasant and cooperative, husband very supportive    Pertinent Vitals/ Pain       Pain Assessment: 0-10 Pain Score: 3  Faces Pain Scale: Hurts  a little bit Pain Location: B LEs Pain Descriptors / Indicators: Aching;Sore Pain Intervention(s): Limited activity within patient's tolerance;Monitored during session  Home Living                                          Prior Functioning/Environment              Frequency  Min 2X/week        Progress Toward Goals  OT Goals(current goals can now be found in the care plan section)  Progress towards OT goals: Progressing toward goals  ADL Goals Pt Will Perform Grooming: with supervision;with set-up;sitting;with min guard  assist Pt Will Perform Upper Body Dressing: with min guard assist;with supervision;with set-up;sitting Pt Will Perform Lower Body Dressing: with max assist;sitting/lateral leans Pt Will Transfer to Toilet: with mod assist;with +2 assist;bedside commode;stand pivot transfer Pt Will Perform Toileting - Clothing Manipulation and hygiene: with max assist;sit to/from stand  Plan Discharge plan remains appropriate    Co-evaluation        PT goals addressed during session: Strengthening/ROM;Other (comment) (Transfers from bed to chair using lift )        AM-PAC PT "6 Clicks" Daily Activity     Outcome Measure   Help from another person eating meals?: None Help from another person taking care of personal grooming?: A Little Help from another person toileting, which includes using toliet, bedpan, or urinal?: Total Help from another person bathing (including washing, rinsing, drying)?: Total Help from another person to put on and taking off regular upper body clothing?: A Little Help from another person to put on and taking off regular lower body clothing?: Total 6 Click Score: 13    End of Session    OT Visit Diagnosis: Other abnormalities of gait and mobility (R26.89);Pain;Muscle weakness (generalized) (M62.81) Pain - Right/Left: Right Pain - part of body: Hip;Leg   Activity Tolerance Patient tolerated treatment well   Patient Left in bed;with call bell/phone within reach;with nursing/sitter in room;with family/visitor present (sittng EOB )   Nurse Communication      Functional Assessment Tool Used: AM-PAC 6 Clicks Daily Activity   Time: 1829-9371 OT Time Calculation (min): 34 min  Charges: OT G-codes **NOT FOR INPATIENT CLASS** Functional Assessment Tool Used: AM-PAC 6 Clicks Daily Activity OT General Charges $OT Visit: 1 Procedure OT Evaluation $OT Re-eval: 1 Procedure OT Treatments $Therapeutic Activity: 8-22 mins     Britt Bottom 04/16/2017, 12:52  PM

## 2017-04-16 NOTE — Progress Notes (Signed)
Called report to [place] and spoke with RN Melissa.  Reviewed AVS with patient and patient's husband.  Patient is waiting on PTAR.

## 2017-04-16 NOTE — Social Work (Signed)
Clinical Social Worker facilitated patient discharge including contacting patient family and facility to confirm patient discharge plans.  Clinical information faxed to facility and family agreeable with plan.  CSW arranged ambulance transport via PT[facility] to [facility].  RN to call [phone] report prior to discharge. Patient going to [id number].  Clinical Social Worker will sign off for now as social work intervention is no longer needed. Please consult [place] again if new need arises.  Elissa Hefty, LCSW Clinical Social Worker [phone]

## 2017-04-16 NOTE — Care Management Important Message (Signed)
Important Message  Patient Details  Name: Daisy Larson MRN: [id number] Date of Birth: Jan 04, 1951   Medicare Important Message Given:  Yes    Orbie Pyo 04/16/2017, 1:49 PM

## 2017-04-16 NOTE — Clinical Social Work Placement (Signed)
   CLINICAL SOCIAL WORK PLACEMENT  NOTE  Date:  04/16/2017  Patient Details  Name: Daisy Larson MRN: [id number] Date of Birth: 09-Feb-1951  Clinical Social Work is seeking post-discharge placement for this patient at the [place] level of care (*CSW will initial, date and re-position this form in  chart as items are completed):  Yes   Patient/family provided with [facility] Work Department's list of facilities offering this level of care within the geographic area requested by the patient (or if unable, by the patient's family).  Yes   Patient/family informed of their freedom to choose among providers that offer the needed level of care, that participate in Medicare, Medicaid or managed care program needed by the patient, have an available bed and are willing to accept the patient.  Yes   Patient/family informed of [facility]'s ownership interest in [facility] and [facility], as well as of the fact that they are under no obligation to receive care at these facilities.  PASRR submitted to EDS on       PASRR number received on 04/11/17     Existing PASRR number confirmed on 04/11/17     FL2 transmitted to all facilities in geographic area requested by pt/family on 04/11/17     FL2 transmitted to all facilities within larger geographic area on 04/11/17     Patient informed that his/her managed care company has contracts with or will negotiate with certain facilities, including the following:        Yes   Patient/family informed of bed offers received.  Patient chooses bed at  [facility])     Physician recommends and patient chooses bed at      Patient to be transferred to [place] on 04/16/17.  Patient to be transferred to facility by PTAR     Patient family notified on 04/16/17 of transfer.  Name of family member notified:  patient responsible for self     PHYSICIAN Please prepare priority discharge summary,  including medications, Please prepare prescriptions, Please sign FL2     Additional Comment:    _______________________________________________ Normajean Baxter, LCSW 04/16/2017, 12:41 PM

## 2017-04-16 NOTE — Progress Notes (Signed)
Physical Therapy Treatment Patient Details Name: Daisy Larson MRN: [id number] DOB: [birth year] Today's Date: 04/16/2017    History of Present Illness Pt adm with rt femur fx after fall at SNF. Underwent ORIF on 04/09/17. Pt with recent (3wks ago) rt femur fx with ORIF. PMH - malignant neoplasm of upper-outer quadrant of right breast in female, Charcot foot and diabetic arthropathy status post several surgical interventions, complicated by intra-articular infection status post intra-articular bead placement about 3 weeks ago, nonalcoholic cirrhosis, lt femur fx 03/18/17    PT Comments    Pt cont to need total assistance for all transfers and bed mobility secondary to weakness. Pt able to maintain seated balance at EOB for ~5 min. With B UE support while completing seated LE strengthening exercises. Pt c/o mild aching pain in B LE and believes this to be d/t swelling. Pt will benefit from cont acute therapy for transfer training, strength training, and education of pt and caregiver for HEP, edema management, and for safe return to SNF.   Follow Up Recommendations        Equipment Recommendations       Recommendations for Other Services       Precautions / Restrictions Precautions Precautions: Fall Required Braces or Orthoses: Other Brace/Splint Other Brace/Splint: pt requires diabetic shoe and "special" shoe for L foot s/p antibiotic beads  Restrictions Weight Bearing Restrictions: Yes RLE Weight Bearing: Partial weight bearing RLE Partial Weight Bearing Percentage or Pounds: 30 LLE Weight Bearing: Weight bearing as tolerated    Mobility  Bed Mobility Overal bed mobility: Needs Assistance   Rolling: Total assist   Supine to sit: +2 for physical assistance;HOB elevated;Mod assist     General bed mobility comments: Pt assist by reaching with UE for rail to roll. Assist required to move trunk, hips, and LES. Pt pivoted to EOB min. A x2.   Transfers Overall transfer level:  Needs assistance               General transfer comment: Used Maximove to transfer pt from bed to recliner x2 assist.   Ambulation/Gait                 Stairs            Wheelchair Mobility    Modified Rankin (Stroke Patients Only)       Balance Overall balance assessment: Needs assistance Sitting-balance support: Bilateral upper extremity supported;Feet supported Sitting balance-Leahy Scale: Poor Sitting balance - Comments: Able to sit on EOB with B UE use during seated exercises.                                     Cognition Arousal/Alertness: Awake/alert Behavior During Therapy: WFL for tasks assessed/performed                           Following Commands: Follows one step commands with increased time     Problem Solving: Requires verbal cues;Slow processing;Requires tactile cues;Decreased initiation;Difficulty sequencing        Exercises General Exercises - Lower Extremity Ankle Circles/Pumps: AROM;Both;10 reps Long Arc Quad: Both;AAROM;10 reps Hip ABduction/ADduction: AAROM;10 reps;Both Hip Flexion/Marching: AAROM;Both;10 reps    General Comments        Pertinent Vitals/Pain Faces Pain Scale: Hurts a little bit Pain Location: B LEs. Pt reports both her calfs ache and believes it is d/t swelling.  Pain Descriptors / Indicators: Aching Pain Intervention(s): Monitored during session    Home Living                      Prior Function            PT Goals (current goals can now be found in the care plan section) Acute Rehab PT Goals Potential to Achieve Goals: Fair Progress towards PT goals: Progressing toward goals    Frequency    Min 3X/week      PT Plan      Co-evaluation     PT goals addressed during session: Strengthening/ROM;Other (comment) (Transfers from bed to chair using lift )        AM-PAC PT "6 Clicks" Daily Activity  Outcome Measure  Difficulty turning over in bed  (including adjusting bedclothes, sheets and blankets)?: Total Difficulty moving from lying on back to sitting on the side of the bed? : Total Difficulty sitting down on and standing up from a chair with arms (e.g., wheelchair, bedside commode, etc,.)?: Total Help needed moving to and from a bed to chair (including a wheelchair)?: Total Help needed walking in hospital room?: Total Help needed climbing 3-5 steps with a railing? : Total 6 Click Score: 6    End of Session   Activity Tolerance: Patient tolerated treatment well Patient left: in chair;with call bell/phone within reach;with family/visitor present;with chair alarm set Nurse Communication: Need for lift equipment;Mobility status PT Visit Diagnosis: Other abnormalities of gait and mobility (R26.89);Repeated falls (R29.6);Muscle weakness (generalized) (M62.81);History of falling (Z91.81);Difficulty in walking, not elsewhere classified (R26.2);Pain Pain - Right/Left: Right Pain - part of body: Leg     Time: 2446-2863 PT Time Calculation (min) (ACUTE ONLY): 24 min  Charges:  $Therapeutic Exercise: 8-22 mins $Therapeutic Activity: 8-22 mins                    G Codes:       Elberta Leatherwood, SPT Acute Rehab [place] 04/16/2017, 12:35 PM

## 2017-04-19 LAB — CULTURE, BLOOD (ROUTINE X 2)
CULTURE: NO GROWTH
CULTURE: NO GROWTH
Special Requests: ADEQUATE
Special Requests: ADEQUATE

## 2017-06-30 DEATH — deceased

## 2017-11-29 ENCOUNTER — Encounter: Payer: Self-pay | Admitting: Internal Medicine

## 2018-08-25 IMAGING — DX DG HIP (WITH OR WITHOUT PELVIS) 2-3V*R*
4 series · 4 of 4 positions shown · non-contrast
Comparison: 03/18/2017

CLINICAL DATA: Acute right hip pain following fall.

EXAM:
DG HIP (WITH OR WITHOUT PELVIS) 2-3V RIGHT

[pelvis ap]
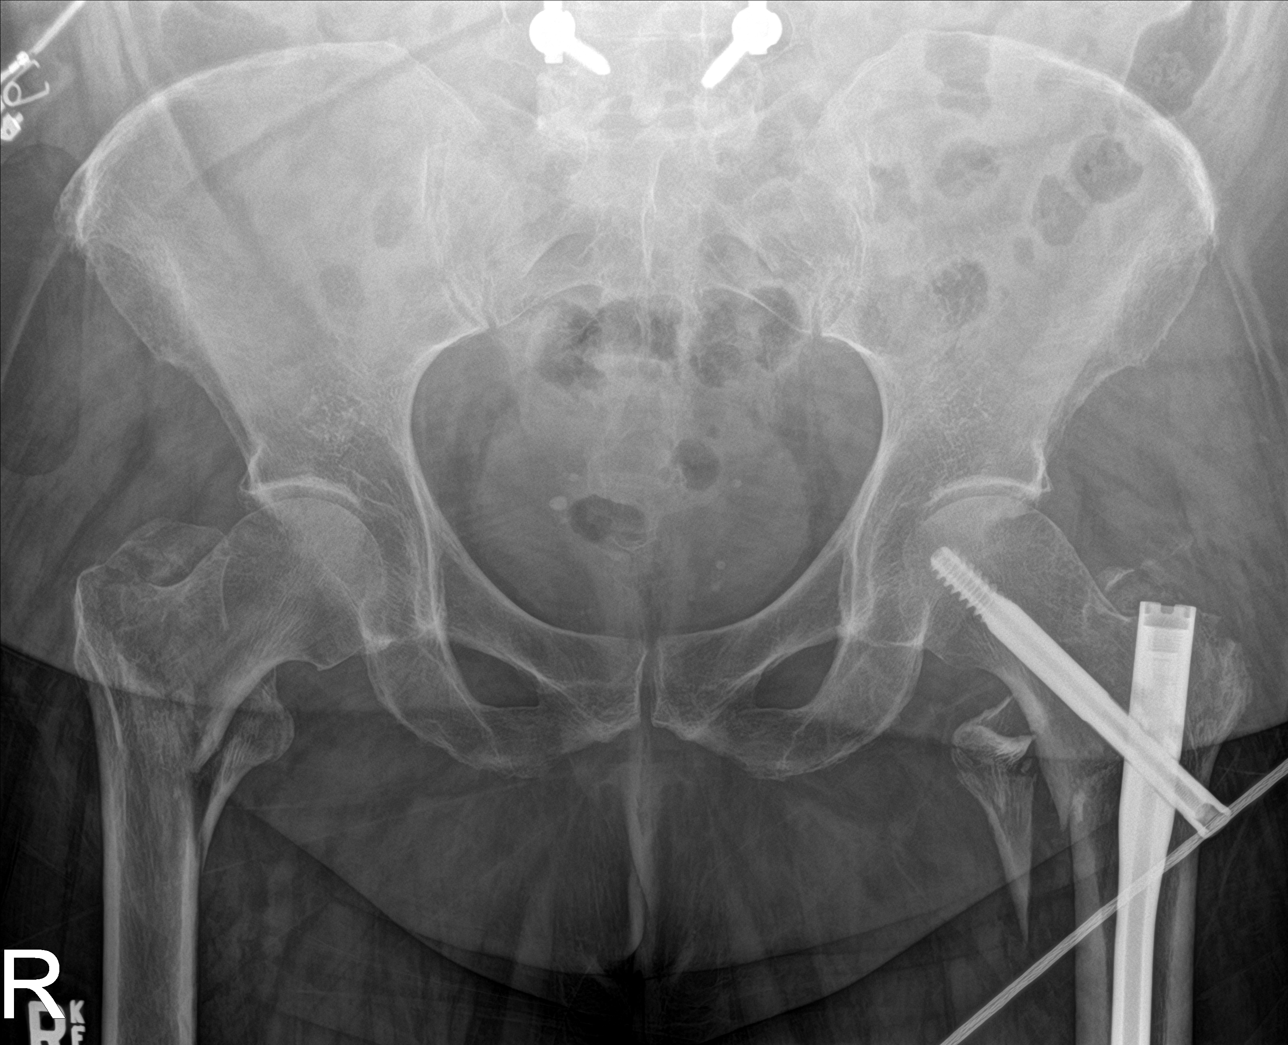

[hip ap]
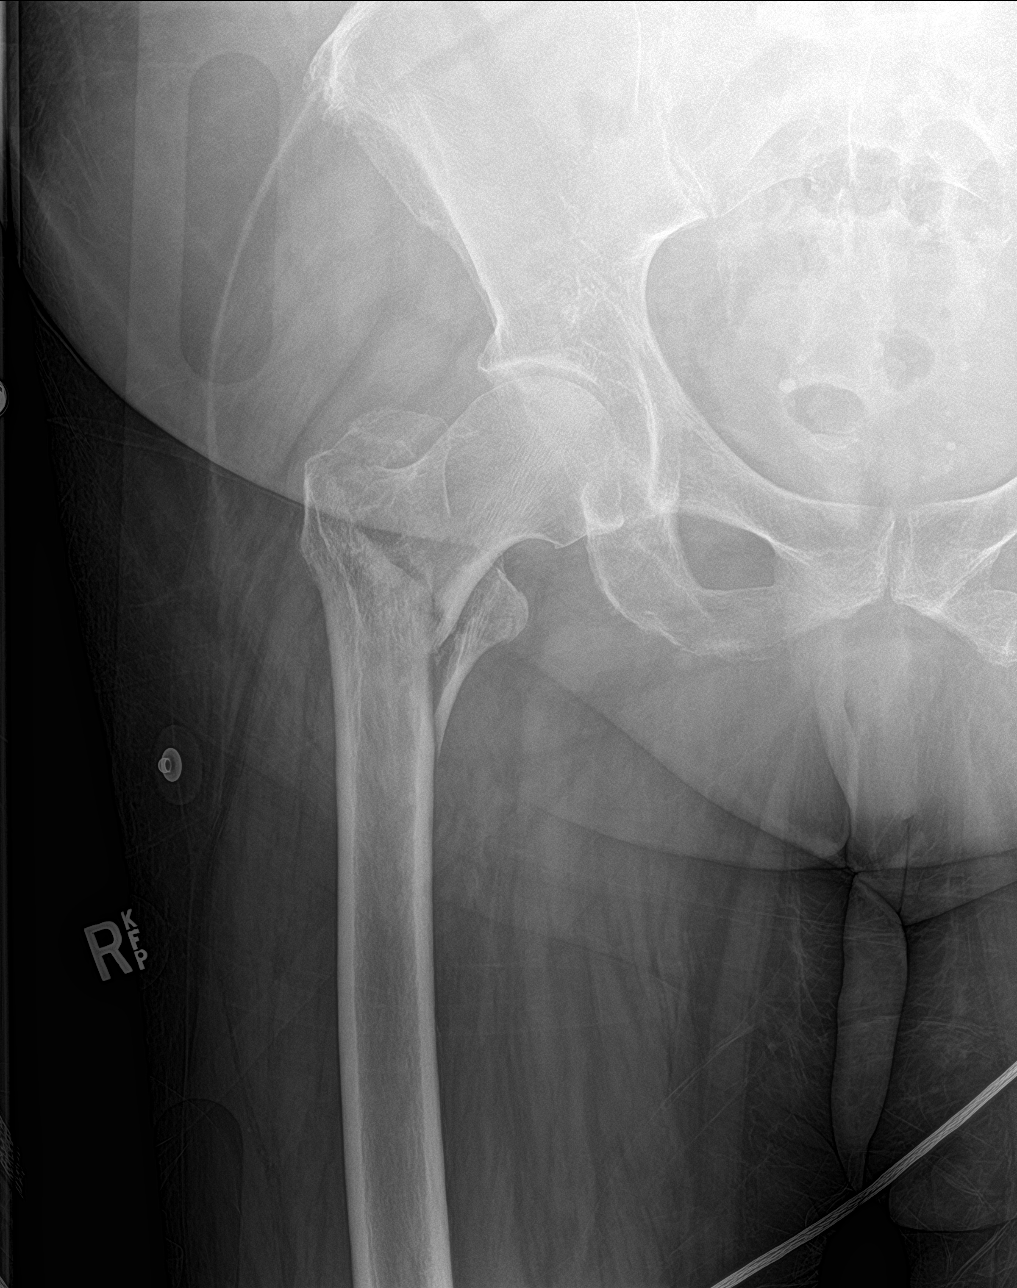

[hip lat]
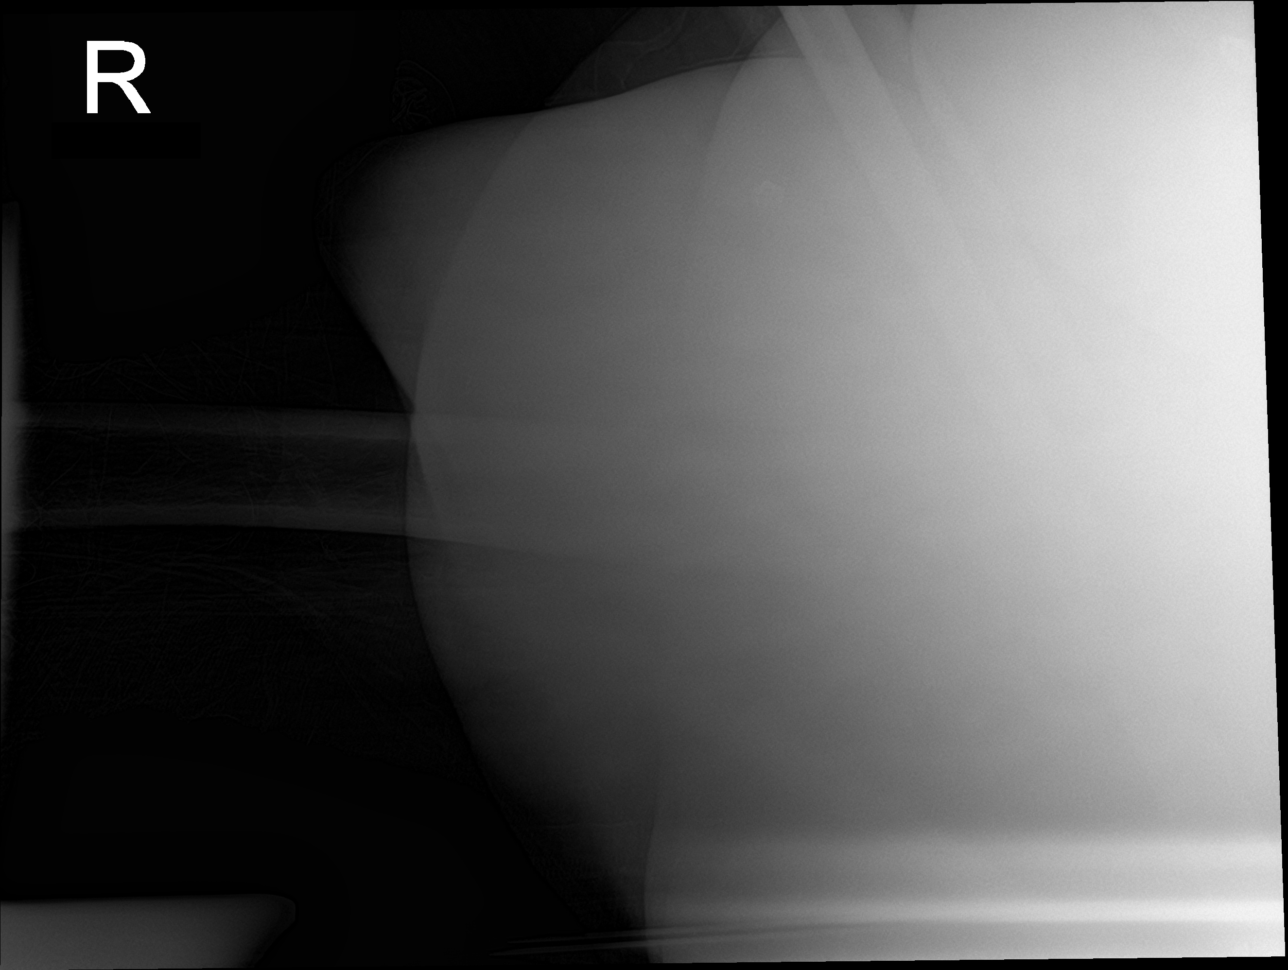

[hip x-table]
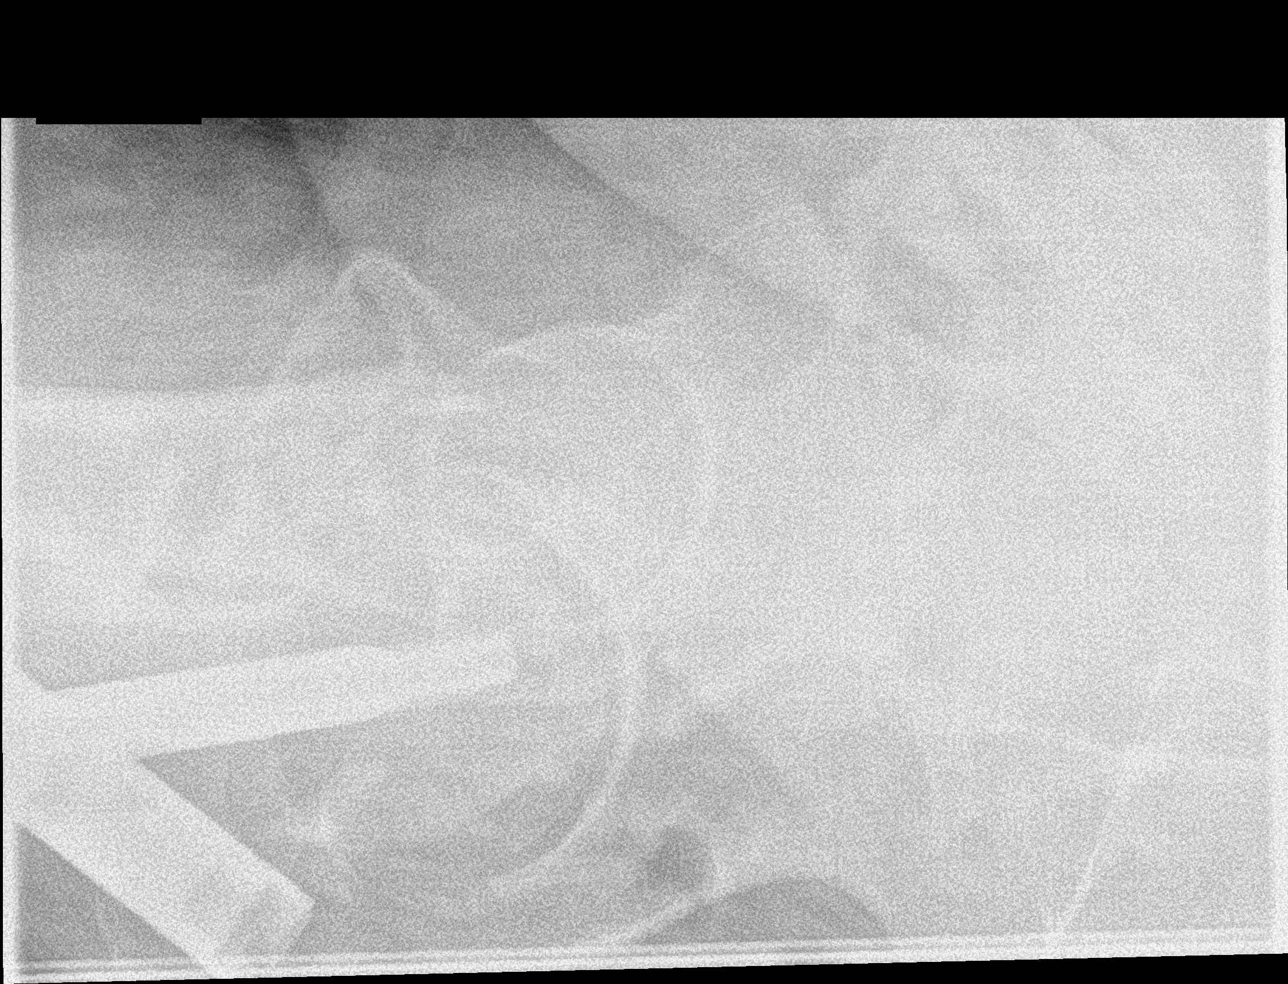

[4 of 4 positions shown; findings below may reference images not displayed]

FINDINGS: An acute comminuted intertrochanteric right femur fracture is noted
with lesser trochanteric fragment displaced by 6 mm.

There is no evidence of subluxation or dislocation.

Internal fixation of a left intertrochanteric femur fracture is
again noted.
IMPRESSION: Acute comminuted intratrochanteric right femur fracture.

## 2018-08-26 IMAGING — DX DG PORTABLE PELVIS
1 series · 1 of 1 positions shown · non-contrast
Comparison: 04/08/2017, 03/18/2017

CLINICAL DATA: Postop

EXAM:
PORTABLE PELVIS 1-2 VIEWS

[pelvis ap]
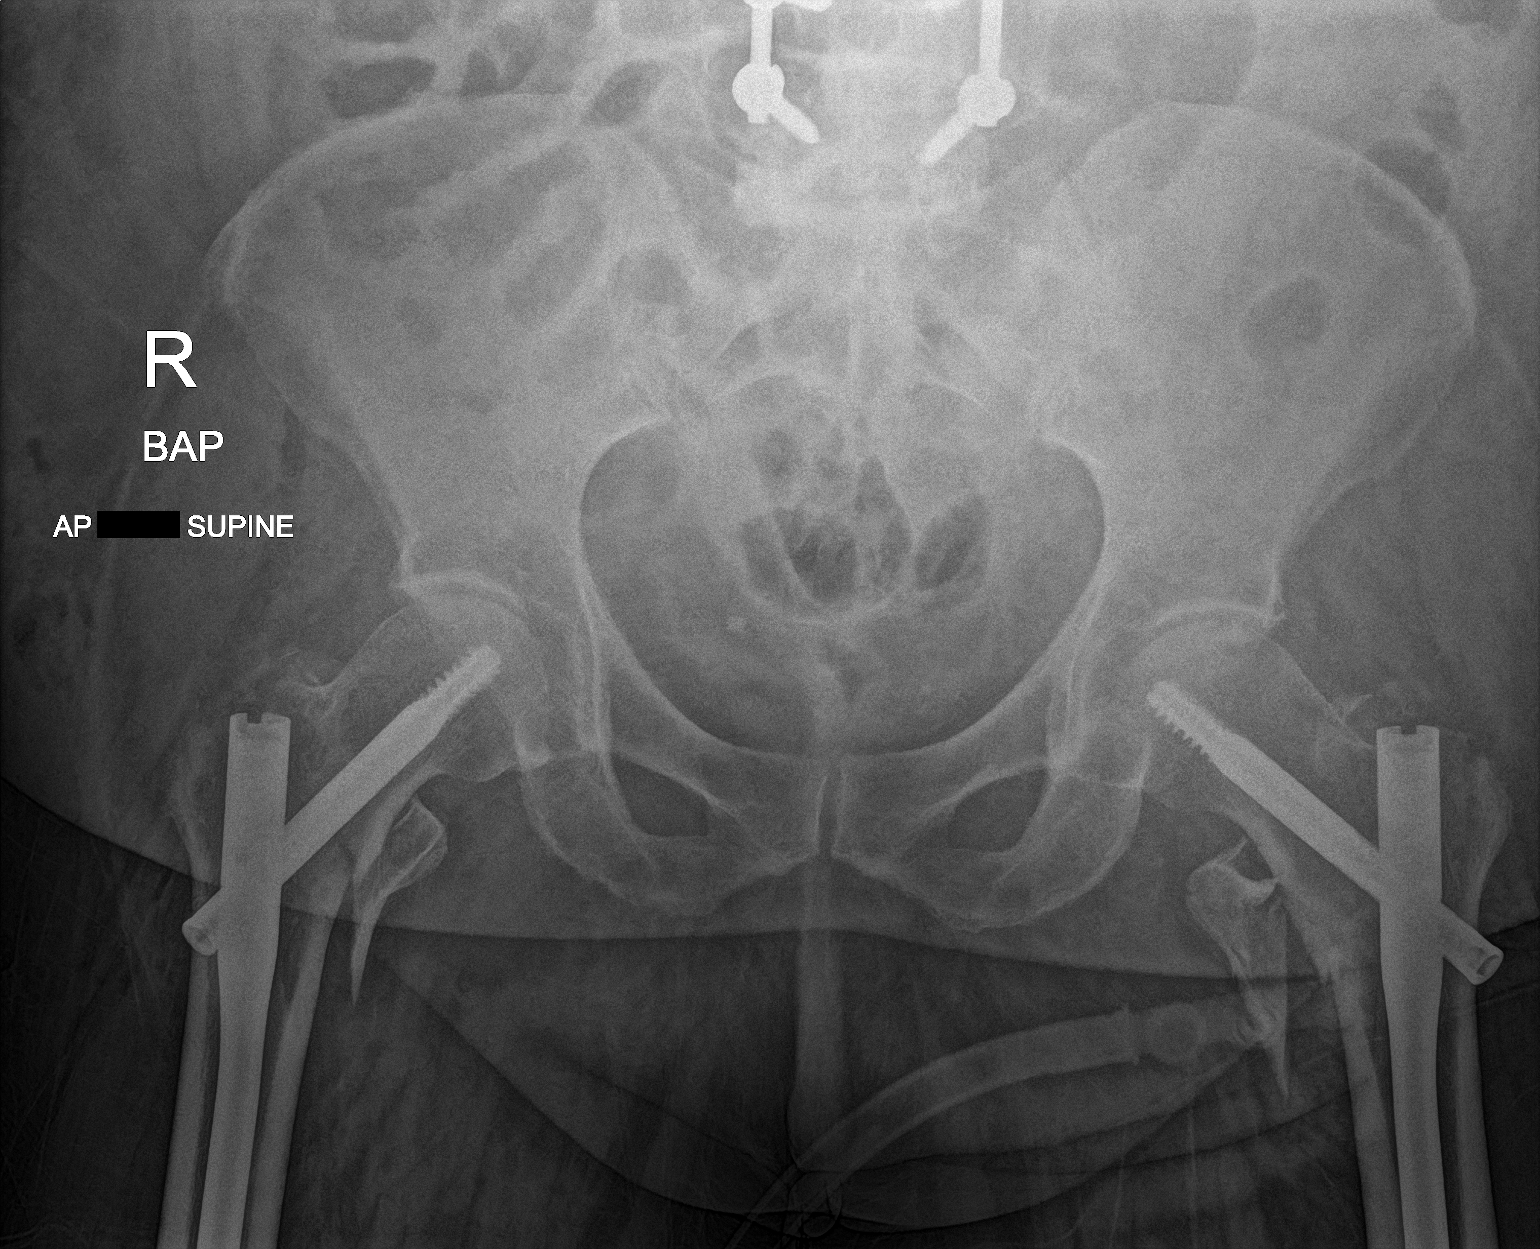

[1 of 1 positions shown; findings below may reference images not displayed]

FINDINGS: Partially visualized lumbar spine hardware. SI joints are symmetric.
Pubic symphysis is intact.

Stable left femoral rod across left trochanteric fracture with
displaced lesser trochanteric fracture fragment as before. Interval
intramedullary rodding of the right intertrochanteric fracture, also
with medially displaced lesser trochanteric fracture fragment. Gas
in the soft tissues consistent with recent surgical status.
IMPRESSION: Interval intramedullary rod fixation of right intertrochanteric
fracture.

## 2021-08-17 ENCOUNTER — Other Ambulatory Visit: Payer: Self-pay | Admitting: Nurse Practitioner
